# Patient Record
Sex: Female | Born: 1975
Health system: Southern US, Community
[De-identification: ages and names within clinical notes are randomized; demographics above are authoritative.]

## PROBLEM LIST (undated history)

## (undated) DIAGNOSIS — E559 Vitamin D deficiency, unspecified: Secondary | ICD-10-CM

## (undated) DIAGNOSIS — M1990 Unspecified osteoarthritis, unspecified site: Secondary | ICD-10-CM

## (undated) DIAGNOSIS — D649 Anemia, unspecified: Secondary | ICD-10-CM

## (undated) DIAGNOSIS — K259 Gastric ulcer, unspecified as acute or chronic, without hemorrhage or perforation: Secondary | ICD-10-CM

## (undated) DIAGNOSIS — R768 Other specified abnormal immunological findings in serum: Secondary | ICD-10-CM

## (undated) DIAGNOSIS — R12 Heartburn: Secondary | ICD-10-CM

## (undated) DIAGNOSIS — E7800 Pure hypercholesterolemia, unspecified: Secondary | ICD-10-CM

## (undated) DIAGNOSIS — E118 Type 2 diabetes mellitus with unspecified complications: Secondary | ICD-10-CM

## (undated) DIAGNOSIS — I10 Essential (primary) hypertension: Secondary | ICD-10-CM

## (undated) DIAGNOSIS — N83209 Unspecified ovarian cyst, unspecified side: Secondary | ICD-10-CM

## (undated) DIAGNOSIS — K769 Liver disease, unspecified: Secondary | ICD-10-CM

## (undated) DIAGNOSIS — H52209 Unspecified astigmatism, unspecified eye: Secondary | ICD-10-CM

## (undated) DIAGNOSIS — K219 Gastro-esophageal reflux disease without esophagitis: Secondary | ICD-10-CM

## (undated) DIAGNOSIS — R531 Weakness: Secondary | ICD-10-CM

## (undated) DIAGNOSIS — R5383 Other fatigue: Secondary | ICD-10-CM

## (undated) DIAGNOSIS — R160 Hepatomegaly, not elsewhere classified: Secondary | ICD-10-CM

## (undated) DIAGNOSIS — K760 Fatty (change of) liver, not elsewhere classified: Secondary | ICD-10-CM

## (undated) DIAGNOSIS — R682 Dry mouth, unspecified: Secondary | ICD-10-CM

## (undated) DIAGNOSIS — H16209 Unspecified keratoconjunctivitis, unspecified eye: Secondary | ICD-10-CM

## (undated) DIAGNOSIS — M791 Myalgia, unspecified site: Secondary | ICD-10-CM

## (undated) DIAGNOSIS — M1712 Unilateral primary osteoarthritis, left knee: Secondary | ICD-10-CM

## (undated) DIAGNOSIS — F32A Depression, unspecified: Secondary | ICD-10-CM

## (undated) DIAGNOSIS — R7689 Other specified abnormal immunological findings in serum: Secondary | ICD-10-CM

## (undated) DIAGNOSIS — E119 Type 2 diabetes mellitus without complications: Secondary | ICD-10-CM

## (undated) DIAGNOSIS — E11319 Type 2 diabetes mellitus with unspecified diabetic retinopathy without macular edema: Secondary | ICD-10-CM

## (undated) DIAGNOSIS — F419 Anxiety disorder, unspecified: Secondary | ICD-10-CM

## (undated) DIAGNOSIS — R918 Other nonspecific abnormal finding of lung field: Secondary | ICD-10-CM

## (undated) HISTORY — DX: Myalgia, unspecified site: M79.10

## (undated) HISTORY — DX: Other specified abnormal immunological findings in serum: R76.89

## (undated) HISTORY — DX: Weakness: R53.1

## (undated) HISTORY — DX: Dry mouth, unspecified: R68.2

## (undated) HISTORY — DX: Anxiety disorder, unspecified: F41.9

## (undated) HISTORY — PX: TUBAL LIGATION: SHX77

## (undated) HISTORY — DX: Unspecified keratoconjunctivitis, unspecified eye: H16.209

## (undated) HISTORY — DX: Other fatigue: R53.83

## (undated) HISTORY — PX: TONSILLECTOMY: SHX5217

## (undated) HISTORY — DX: Other specified abnormal immunological findings in serum: R76.8

## (undated) HISTORY — DX: Anemia, unspecified: D64.9

## (undated) HISTORY — DX: Type 2 diabetes mellitus with unspecified complications: E11.8

## (undated) HISTORY — DX: Gastric ulcer, unspecified as acute or chronic, without hemorrhage or perforation: K25.9

## (undated) HISTORY — PX: DILATION AND CURETTAGE OF UTERUS: SHX78

## (undated) HISTORY — DX: Hepatomegaly, not elsewhere classified: R16.0

## (undated) HISTORY — DX: Type 2 diabetes mellitus with unspecified diabetic retinopathy without macular edema: E11.319

## (undated) HISTORY — DX: Depression, unspecified: F32.A

## (undated) HISTORY — DX: Vitamin D deficiency, unspecified: E55.9

## (undated) HISTORY — DX: Liver disease, unspecified: K76.9

## (undated) HISTORY — DX: Gastro-esophageal reflux disease without esophagitis: K21.9

## (undated) HISTORY — DX: Type 2 diabetes mellitus without complications: E11.9

## (undated) HISTORY — DX: Unspecified astigmatism, unspecified eye: H52.209

## (undated) HISTORY — DX: Heartburn: R12

## (undated) HISTORY — DX: Unilateral primary osteoarthritis, left knee: M17.12

## (undated) HISTORY — DX: Pure hypercholesterolemia, unspecified: E78.00

---

## 2011-06-25 ENCOUNTER — Encounter: Payer: Self-pay | Admitting: *Deleted

## 2011-06-25 ENCOUNTER — Emergency Department (HOSPITAL_BASED_OUTPATIENT_CLINIC_OR_DEPARTMENT_OTHER)
Admission: EM | Admit: 2011-06-25 | Discharge: 2011-06-25 | Disposition: A | Discharge status: Home / Self Care | Payer: Self-pay | Attending: Emergency Medicine | Admitting: Emergency Medicine

## 2011-06-25 DIAGNOSIS — N898 Other specified noninflammatory disorders of vagina: Secondary | ICD-10-CM | POA: Insufficient documentation

## 2011-06-25 DIAGNOSIS — N939 Abnormal uterine and vaginal bleeding, unspecified: Secondary | ICD-10-CM

## 2011-06-25 DIAGNOSIS — R7309 Other abnormal glucose: Secondary | ICD-10-CM | POA: Insufficient documentation

## 2011-06-25 DIAGNOSIS — R739 Hyperglycemia, unspecified: Secondary | ICD-10-CM

## 2011-06-25 HISTORY — DX: Essential (primary) hypertension: I10

## 2011-06-25 LAB — URINALYSIS, ROUTINE W REFLEX MICROSCOPIC
Bilirubin Urine: NEGATIVE
Ketones, ur: 15 mg/dL — AB
Leukocytes, UA: NEGATIVE
Nitrite: NEGATIVE
Protein, ur: NEGATIVE mg/dL
Urobilinogen, UA: 0.2 mg/dL (ref 0.0–1.0)

## 2011-06-25 LAB — CBC
Hemoglobin: 13.7 g/dL (ref 12.0–15.0)
MCH: 22.9 pg — ABNORMAL LOW (ref 26.0–34.0)
MCHC: 33.8 g/dL (ref 30.0–36.0)
MCV: 67.6 fL — ABNORMAL LOW (ref 78.0–100.0)
RBC: 5.99 MIL/uL — ABNORMAL HIGH (ref 3.87–5.11)

## 2011-06-25 LAB — WET PREP, GENITAL

## 2011-06-25 LAB — PROTIME-INR: Prothrombin Time: 13.6 seconds (ref 11.6–15.2)

## 2011-06-25 LAB — BASIC METABOLIC PANEL
BUN: 16 mg/dL (ref 6–23)
Chloride: 94 mEq/L — ABNORMAL LOW (ref 96–112)
Creatinine, Ser: 0.7 mg/dL (ref 0.50–1.10)
GFR calc Af Amer: >60 mL/min (ref >60)
Glucose, Bld: 349 mg/dL — ABNORMAL HIGH (ref 70–99)
Potassium: 4 mEq/L (ref 3.5–5.1)

## 2011-06-25 LAB — PREGNANCY, URINE: Preg Test, Ur: NEGATIVE

## 2011-06-25 LAB — URINE MICROSCOPIC-ADD ON

## 2011-06-25 MED ORDER — ONDANSETRON HCL 4 MG/2ML IJ SOLN
4.0000 mg | Freq: Once | INTRAMUSCULAR | Status: AC
  Administered 2011-06-25: 4 mg via INTRAVENOUS
  Filled 2011-06-25: qty 2

## 2011-06-25 MED ORDER — SODIUM CHLORIDE 0.9 % IV BOLUS (SEPSIS)
1000.0000 mL | Freq: Once | INTRAVENOUS | Status: AC
  Administered 2011-06-25: 1000 mL via INTRAVENOUS

## 2011-06-25 NOTE — ED Notes (Signed)
Pt. In no distress at discharge 

## 2011-06-25 NOTE — ED Provider Notes (Signed)
History     CSN: [id number] Arrival date & time: 06/25/2011 11:48 AM  Chief Complaint  Patient presents with  . Vaginal Bleeding   HPI Comments: Patient presents with vaginal bleeding that has been going on since June intermittently. She reports some subjective shortness of breath and fatigue since the middle of August. She says she saturates about 6 pads daily with some clots. She was recently on hydrochlorothiazide for hypertension. She has abdominal pain, chest pain, nausea, vomiting. She did require blood transfusion. She does have a history of fibroids and has had 2 prior pregnancies and tubal ligation. She does have some mild lower abdominal cramping that comes and goes with bleeding.  The history is provided by the patient.    Past Medical History  Diagnosis Date  . Hypertension     Past Surgical History  Procedure Date  . Tubal ligation   . Dilation and curettage of uterus     No family history on file.  History  Substance Use Topics  . Smoking status: Passive Smoker  . Smokeless tobacco: Not on file  . Alcohol Use: No    OB History    Grav Para Term Preterm Abortions TAB SAB Ect Mult Living                  Review of Systems  Constitutional: Positive for fatigue. Negative for fever, activity change and appetite change.  HENT: Negative for congestion and rhinorrhea.   Eyes: Negative for visual disturbance.  Respiratory: Positive for shortness of breath.   Gastrointestinal: Positive for abdominal pain. Negative for nausea and vomiting.  Genitourinary: Positive for hematuria, vaginal bleeding and pelvic pain. Negative for dysuria.  Musculoskeletal: Negative for back pain.  Neurological: Negative for dizziness, weakness, light-headedness and headaches.    Physical Exam  BP 164/88  Pulse 105  Temp(Src) 98.2 F (36.8 C) (Oral)  Resp 22  SpO2 100%  Physical Exam  Constitutional: She is oriented to person, place, and time. She appears well-developed and  well-nourished. No distress.  HENT:  Head: Normocephalic and atraumatic.  Mouth/Throat: Oropharynx is clear and moist. No oropharyngeal exudate.  Eyes: Conjunctivae are normal. Pupils are equal, round, and reactive to light.  Neck: Normal range of motion.  Cardiovascular: Normal rate, regular rhythm and normal heart sounds.   Pulmonary/Chest: Effort normal and breath sounds normal. No respiratory distress.  Abdominal: Soft. There is no tenderness. There is no rebound and no guarding.  Genitourinary: Cervix exhibits no motion tenderness and no friability. Right adnexum displays no tenderness and no fullness. Left adnexum displays no tenderness and no fullness. There is bleeding around the vagina.       No active bleeding. Dark blood in vault.  Cervix closed and nontender  Musculoskeletal: Normal range of motion. She exhibits no edema and no tenderness.  Neurological: She is alert and oriented to person, place, and time. No cranial nerve deficit.  Skin: Skin is warm.    ED Course  Procedures  MDM Vaginal bleeding, fatigue and subjective SOB.   CBC, INR, pelvic exam, orthostatics  Orthostatics positive.  Patient given IV hydration in ED.  No active bleeding on exam, hemoglobin stable. Discussed options for DUB including hormone therapy.  Patient will discuss with her doctor. Will treat for bacterial vaginosis.  Elevated sugar noted as well as glucose in the urine. Patient reports no history of diabetes but states that her doctor at urgent care in [facility] has her already set up for fasting blood  work this coming Thursday. I told the patient this is suspicious for diabetes but she will need formal testing.   Results for orders placed during the hospital encounter of 06/25/11  CBC      Component Value Range   WBC 8.1  4.0 - 10.5 (K/uL)   RBC 5.99 (*) 3.87 - 5.11 (MIL/uL)   Hemoglobin 13.7  12.0 - 15.0 (g/dL)   HCT 16.1  09.6 - 04.5 (%)   MCV 67.6 (*) 78.0 - 100.0 (fL)   MCH 22.9 (*)  26.0 - 34.0 (pg)   MCHC 33.8  30.0 - 36.0 (g/dL)   RDW 40.9  81.1 - 91.4 (%)   Platelets 272  150 - 400 (K/uL)  PROTIME-INR      Component Value Range   Prothrombin Time 13.6  11.6 - 15.2 (seconds)   INR 1.02  0.00 - 1.49   BASIC METABOLIC PANEL      Component Value Range   Sodium 131 (*) 135 - 145 (mEq/L)   Potassium 4.0  3.5 - 5.1 (mEq/L)   Chloride 94 (*) 96 - 112 (mEq/L)   CO2 24  19 - 32 (mEq/L)   Glucose, Bld 349 (*) 70 - 99 (mg/dL)   BUN 16  6 - 23 (mg/dL)   Creatinine, Ser 7.82  0.50 - 1.10 (mg/dL)   Calcium 9.9  8.4 - 95.6 (mg/dL)   GFR calc non Af Amer >60  >60 (mL/min)   GFR calc Af Amer >60  >60 (mL/min)  WET PREP, GENITAL      Component Value Range   Yeast, Wet Prep NONE SEEN  NONE SEEN    Trich, Wet Prep NONE SEEN  NONE SEEN    Clue Cells, Wet Prep FEW (*) NONE SEEN    WBC, Wet Prep HPF POC FEW (*) NONE SEEN    No results found.      Glynn Octave, MD 06/25/11 [phone]

## 2011-06-25 NOTE — ED Notes (Signed)
Heavy vaginal bleeding on and off since June. Was seen at a clinic Aug 23rd and her BP was elevated. They gave her HCTZ for hypertension. She has been tired and sob with activity since that time.

## 2011-06-25 NOTE — ED Notes (Signed)
Abd cramping this am with vaginal blood clots.

## 2011-06-26 LAB — GC/CHLAMYDIA PROBE AMP, GENITAL
Chlamydia, DNA Probe: NEGATIVE
GC Probe Amp, Genital: NEGATIVE

## 2013-06-09 ENCOUNTER — Ambulatory Visit (INDEPENDENT_AMBULATORY_CARE_PROVIDER_SITE_OTHER): Payer: Self-pay | Admitting: Obstetrics and Gynecology

## 2013-06-09 ENCOUNTER — Encounter: Payer: Self-pay | Admitting: Obstetrics and Gynecology

## 2013-06-09 ENCOUNTER — Other Ambulatory Visit (HOSPITAL_COMMUNITY)
Admission: RE | Admit: 2013-06-09 | Discharge: 2013-06-09 | Disposition: A | Discharge status: Home / Self Care | Payer: Self-pay | Source: Ambulatory Visit | Attending: Obstetrics and Gynecology | Admitting: Obstetrics and Gynecology

## 2013-06-09 VITALS — BP 145/97 | HR 105 | Temp 97.2°F | Ht 63.0 in | Wt 297.9 lb

## 2013-06-09 DIAGNOSIS — N939 Abnormal uterine and vaginal bleeding, unspecified: Secondary | ICD-10-CM

## 2013-06-09 DIAGNOSIS — N949 Unspecified condition associated with female genital organs and menstrual cycle: Secondary | ICD-10-CM | POA: Insufficient documentation

## 2013-06-09 DIAGNOSIS — N926 Irregular menstruation, unspecified: Secondary | ICD-10-CM

## 2013-06-09 DIAGNOSIS — N938 Other specified abnormal uterine and vaginal bleeding: Secondary | ICD-10-CM | POA: Insufficient documentation

## 2013-06-09 NOTE — Progress Notes (Signed)
  Subjective:    Patient ID: Elizabeth [name], female    DOB: 09-10-1976, 37 y.o.   MRN: [id number]  HPI 37 yo with BMI 52 and LMP 06/05/2013 presenting today for evaluation of abnormal uterine bleeding. Patient reports abnormal bleeding for the past year. She describes her cycles as being heavy and prolonged (lasting up to 6 weeks) with passage of large clots. Patient denies chest pain, SOB, lightheadedness/dizziness.    Past Medical History  Diagnosis Date  . Hypertension   . Diabetes mellitus without complication    Past Surgical History  Procedure Laterality Date  . Tubal ligation    . Dilation and curettage of uterus    . Tonsillectomy     Family History  Problem Relation Age of Onset  . Diabetes Mother   . Hypertension Mother    History  Substance Use Topics  . Smoking status: Never Smoker   . Smokeless tobacco: Never Used  . Alcohol Use: No      Review of Systems  All other systems reviewed and are negative.       Objective:   Physical Exam GENERAL: Well-developed, well-nourished female in no acute distress.  ABDOMEN: Soft, nontender, nondistended. Obese. PELVIC: Normal external female genitalia. Vagina is pink and rugated.  Normal discharge. Normal appearing cervix. Bimanual exam limited secondary to body habitus EXTREMITIES: No cyanosis, clubbing, or edema, 2+ distal pulses.  04/12/2013 ultrasound: Uterus measures 9.8x 4.4x 5.6 cm with endometrium measuring 5.69mm. No uterine mass. Normal bilateral ovaries- right: 20x17x49mm and left: 27x19x23mm. No adnexal masses. No free fluid in pelvis.    Assessment & Plan:  37 yo with abnormal uterine bleeding - Discussed need for endometrial biopsy ENDOMETRIAL BIOPSY     The indications for endometrial biopsy were reviewed.   Risks of the biopsy including cramping, bleeding, infection, uterine perforation, inadequate specimen and need for additional procedures  were discussed. The patient states she understands and agrees  to undergo procedure today. Consent was signed. Time out was performed. Urine HCG was negative. A sterile speculum was placed in the patient's vagina and the cervix was prepped with Betadine. A single-toothed tenaculum was placed on the anterior lip of the cervix to stabilize it. The uterine cavity was sounded to a depth of 9 cm using the uterine sound. The 3 mm pipelle was introduced into the endometrial cavity without difficulty, 2 passes were made.  A  moderate amount of tissue was  sent to pathology. The instruments were removed from the patient's vagina. Minimal bleeding from the cervix was noted. The patient tolerated the procedure well.  Routine post-procedure instructions were given to the patient. The patient will follow up in two weeks to review the results and for further management.   - patient reports having a normal ultrasound in June- will obtain records for review - RTC in 2 weeks to discuss results and further management. - Discussed medical management with Mirena IUD for which the patient is very much interested

## 2013-06-14 ENCOUNTER — Encounter: Payer: Self-pay | Admitting: *Deleted

## 2013-06-23 ENCOUNTER — Encounter: Payer: Self-pay | Admitting: *Deleted

## 2013-07-21 ENCOUNTER — Ambulatory Visit: Payer: Self-pay | Admitting: Obstetrics & Gynecology

## 2013-08-09 ENCOUNTER — Ambulatory Visit (INDEPENDENT_AMBULATORY_CARE_PROVIDER_SITE_OTHER): Payer: Self-pay | Admitting: Obstetrics & Gynecology

## 2013-08-09 ENCOUNTER — Encounter: Payer: Self-pay | Admitting: Obstetrics & Gynecology

## 2013-08-09 VITALS — BP 121/85 | HR 101 | Temp 97.8°F | Ht 63.0 in | Wt 304.4 lb

## 2013-08-09 DIAGNOSIS — N920 Excessive and frequent menstruation with regular cycle: Secondary | ICD-10-CM

## 2013-08-09 DIAGNOSIS — Z3049 Encounter for surveillance of other contraceptives: Secondary | ICD-10-CM

## 2013-08-09 DIAGNOSIS — Z01812 Encounter for preprocedural laboratory examination: Secondary | ICD-10-CM

## 2013-08-09 MED ORDER — LEVONORGESTREL 20 MCG/24HR IU IUD
INTRAUTERINE_SYSTEM | Freq: Once | INTRAUTERINE | Status: AC
  Administered 2013-08-09: 16:00:00 via INTRAUTERINE

## 2013-08-09 NOTE — Patient Instructions (Signed)

## 2013-08-09 NOTE — Addendum Note (Signed)
Addended by: Faythe Casa on: 08/09/2013 03:51 PM   Modules accepted: Orders

## 2013-08-09 NOTE — Progress Notes (Signed)
Subjective:     Patient ID: Elizabeth [name], female   DOB: 03/01/1976, 37 y.o.   MRN: [id number]  HPI  Elizabeth [name] is a 37 y.o. No obstetric history on file. here for Mirena IUD insertion. H/o menorrhagia with negative endometrial biopsy 06/09/2013.      Review of Systems     Objective:   Physical Exam BP 121/85  Pulse 101  Temp(Src) 97.8 F (36.6 C)  Ht 5\' 3"  (1.6 m)  Wt 304 lb 6.4 oz (138.075 kg)  BMI 53.94 kg/m2  LMP 07/19/2013  GYNECOLOGY CLINIC PROCEDURE NOTE IUD Insertion Procedure Note Patient identified, informed consent performed.  Discussed risks of irregular bleeding, cramping, infection, malpositioning or misplacement of the IUD outside the uterus which may require further procedures. Time out was performed.  Urine pregnancy test negative.  Speculum placed in the vagina.  Cervix visualized.  Cleaned with Betadine x 2.  Grasped anteriorly with a single tooth tenaculum.  Uterus sounded to 8 cm.  Mirena IUD placed per manufacturer's recommendations.  Strings trimmed to 3 cm. Tenaculum was removed, good hemostasis noted.  Patient tolerated procedure well.    06/09/2013 Diagnosis Endometrium, biopsy - DEGENERATING SECRETORY-TYPE ENDOMETRIUM. - THERE IS NO EVIDENCE OF HYPERPLASIA OR MALIGNANCY.     Assessment:     menorrhagia     Plan:    Patient was given post-procedure instructions.   Patient was also asked to follow up in 5weeks for IUD check.

## 2013-08-11 ENCOUNTER — Encounter: Payer: Self-pay | Admitting: *Deleted

## 2013-08-12 NOTE — Progress Notes (Signed)
Mirena for this patient was obtained through the Patient Assistance Program [facility].

## 2013-08-17 ENCOUNTER — Ambulatory Visit (INDEPENDENT_AMBULATORY_CARE_PROVIDER_SITE_OTHER): Payer: Self-pay | Admitting: Obstetrics and Gynecology

## 2013-08-17 ENCOUNTER — Encounter: Payer: Self-pay | Admitting: Family Medicine

## 2013-08-17 ENCOUNTER — Telehealth: Payer: Self-pay | Admitting: *Deleted

## 2013-08-17 VITALS — BP 110/78 | HR 90 | Temp 97.0°F | Ht 63.0 in | Wt 302.5 lb

## 2013-08-17 DIAGNOSIS — Z30431 Encounter for routine checking of intrauterine contraceptive device: Secondary | ICD-10-CM

## 2013-08-17 NOTE — Progress Notes (Signed)
Patient ID: Elizabeth [name], female   DOB: 1976/03/20, 37 y.o.   MRN: [id number] 37 yo G2P2 here today for evaluation of IUD. Patient had IUD placed on 10/20 and states that she experienced some vaginal irritation yesterday and felt the IUD with her fingers coming out. Patient denies cramping but admits to vaginal bleeding similar to a period since insertion  GENERAL: Well-developed, well-nourished female in no acute distress. obese PELVIC: Normal external female genitalia. Vagina is pink and rugated.  Normal discharge. Normal appearing cervix with 3 cm strings extending from external os. Bimanual exam limited to size. No adnexal mass or tenderness. EXTREMITIES: No cyanosis, clubbing, or edema, 2+ distal pulses.  A/P 37 yo s/p IUD placement a week ago - Reassurance provided as IUD appears to be in place - Patient believes that I pushed IUD back into place when inserting the speculum. Offered pelvic ultrasound to confirm IUD placement- patient declined. Offered to cut the strings shorter- patient declined. Patient left office visibly upset. - RTC as scheduled for string check

## 2013-08-17 NOTE — Telephone Encounter (Signed)
Pt left message stating that she had IUD placed on 10/20 and is having problems. She is having heavy bleeding which she knew to expect but also feels like the device is "sticking" her. She stated that she felt for the strings and they were "right there" inside the vagina as well as she thought she was feeling the IUD. I called pt and left message that we need to see her today. Please call ASAP for appt.

## 2013-08-26 ENCOUNTER — Other Ambulatory Visit: Payer: Self-pay

## 2013-09-13 ENCOUNTER — Ambulatory Visit (INDEPENDENT_AMBULATORY_CARE_PROVIDER_SITE_OTHER): Payer: Self-pay | Admitting: Obstetrics & Gynecology

## 2013-09-13 ENCOUNTER — Encounter: Payer: Self-pay | Admitting: Obstetrics & Gynecology

## 2013-09-13 VITALS — BP 147/97 | HR 88 | Temp 98.9°F | Ht 63.0 in | Wt 301.9 lb

## 2013-09-13 DIAGNOSIS — Z30431 Encounter for routine checking of intrauterine contraceptive device: Secondary | ICD-10-CM

## 2013-09-13 NOTE — Patient Instructions (Signed)
Levonorgestrel intrauterine device (IUD) What is this medicine? LEVONORGESTREL IUD (LEE voe nor jes trel) is a contraceptive (birth control) device. The device is placed inside the uterus by a healthcare professional. It is used to prevent pregnancy and can also be used to treat heavy bleeding that occurs during your period. Depending on the device, it can be used for 3 to 5 years. This medicine may be used for other purposes; ask your health care provider or pharmacist if you have questions. COMMON BRAND NAME(S): Mirena, Skyla What should I tell my health care provider before I take this medicine? They need to know if you have any of these conditions: -abnormal Pap smear -cancer of the breast, uterus, or cervix -diabetes -endometritis -genital or pelvic infection now or in the past -have more than one sexual partner or your partner has more than one partner -heart disease -history of an ectopic or tubal pregnancy -immune system problems -IUD in place -liver disease or tumor -problems with blood clots or take blood-thinners -use intravenous drugs -uterus of unusual shape -vaginal bleeding that has not been explained -an unusual or allergic reaction to levonorgestrel, other hormones, silicone, or polyethylene, medicines, foods, dyes, or preservatives -pregnant or trying to get pregnant -breast-feeding How should I use this medicine? This device is placed inside the uterus by a health care professional. Talk to your pediatrician regarding the use of this medicine in children. Special care may be needed. Overdosage: If you think you have taken too much of this medicine contact a poison control center or emergency room at once. NOTE: This medicine is only for you. Do not share this medicine with others. What if I miss a dose? This does not apply. What may interact with this medicine? Do not take this medicine with any of the following  medications: -amprenavir -bosentan -fosamprenavir This medicine may also interact with the following medications: -aprepitant -barbiturate medicines for inducing sleep or treating seizures -bexarotene -griseofulvin -medicines to treat seizures like carbamazepine, ethotoin, felbamate, oxcarbazepine, phenytoin, topiramate -modafinil -pioglitazone -rifabutin -rifampin -rifapentine -some medicines to treat HIV infection like atazanavir, indinavir, lopinavir, nelfinavir, tipranavir, ritonavir -St. John's wort -warfarin This list may not describe all possible interactions. Give your health care provider a list of all the medicines, herbs, non-prescription drugs, or dietary supplements you use. Also tell them if you smoke, drink alcohol, or use illegal drugs. Some items may interact with your medicine. What should I watch for while using this medicine? Visit your doctor or health care professional for regular check ups. See your doctor if you or your partner has sexual contact with others, becomes HIV positive, or gets a sexual transmitted disease. This product does not protect you against HIV infection (AIDS) or other sexually transmitted diseases. You can check the placement of the IUD yourself by reaching up to the top of your vagina with clean fingers to feel the threads. Do not pull on the threads. It is a good habit to check placement after each menstrual period. Call your doctor right away if you feel more of the IUD than just the threads or if you cannot feel the threads at all. The IUD may come out by itself. You may become pregnant if the device comes out. If you notice that the IUD has come out use a backup birth control method like condoms and call your health care provider. Using tampons will not change the position of the IUD and are okay to use during your period. What side effects may I   notice from receiving this medicine? Side effects that you should report to your doctor or  health care professional as soon as possible: -allergic reactions like skin rash, itching or hives, swelling of the face, lips, or tongue -fever, flu-like symptoms -genital sores -high blood pressure -no menstrual period for 6 weeks during use -pain, swelling, warmth in the leg -pelvic pain or tenderness -severe or sudden headache -signs of pregnancy -stomach cramping -sudden shortness of breath -trouble with balance, talking, or walking -unusual vaginal bleeding, discharge -yellowing of the eyes or skin Side effects that usually do not require medical attention (report to your doctor or health care professional if they continue or are bothersome): -acne -breast pain -change in sex drive or performance -changes in weight -cramping, dizziness, or faintness while the device is being inserted -headache -irregular menstrual bleeding within first 3 to 6 months of use -nausea This list may not describe all possible side effects. Call your doctor for medical advice about side effects. You may report side effects to FDA at 1-800-FDA-1088. Where should I keep my medicine? This does not apply. NOTE: This sheet is a summary. It may not cover all possible information. If you have questions about this medicine, talk to your doctor, pharmacist, or health care provider.  2014, Elsevier/Gold Standard. (2011-11-07 13:54:04)  

## 2013-09-13 NOTE — Progress Notes (Signed)
Patient ID: Elizabeth [name], female   DOB: 03-25-1976, 37 y.o.   MRN: [id number] History:  37 y.o. [id number] here today for today for IUD string check; Mirena IUD was placed 08/09/2013.  Pt complains that she is feeling the strings. No other concerning side effects.   Review of Systems:  Pertinent items are noted in HPI.  Objective:  Physical Exam Blood pressure 147/97, pulse 88, temperature 98.9 F (37.2 C), temperature source Oral, height 5\' 3"  (1.6 m), weight 301 lb 14.4 oz (136.941 kg), last menstrual period 09/04/2013. Gen: NAD Abd: Soft, nontender and nondistended Pelvic: Normal appearing external genitalia; normal appearing vaginal mucosa and cervix.  IUD strings visualized, about 1 cm in length outside the vagina.   These were cut to 3 cm outside the cervix.  Assessment & Plan:  Normal IUD check. Patient to keep IUD in place for five years; can come in for removal if she desires pregnancy within the next five years. Routine preventative health maintenance measures emphasized.

## 2014-08-22 ENCOUNTER — Encounter: Payer: Self-pay | Admitting: Obstetrics & Gynecology

## 2015-12-08 ENCOUNTER — Emergency Department (HOSPITAL_BASED_OUTPATIENT_CLINIC_OR_DEPARTMENT_OTHER)
Admission: EM | Admit: 2015-12-08 | Discharge: 2015-12-08 | Disposition: A | Discharge status: Home / Self Care | Payer: PRIVATE HEALTH INSURANCE | Attending: Emergency Medicine | Admitting: Emergency Medicine

## 2015-12-08 ENCOUNTER — Emergency Department (HOSPITAL_BASED_OUTPATIENT_CLINIC_OR_DEPARTMENT_OTHER): Payer: PRIVATE HEALTH INSURANCE

## 2015-12-08 ENCOUNTER — Encounter (HOSPITAL_BASED_OUTPATIENT_CLINIC_OR_DEPARTMENT_OTHER): Payer: Self-pay | Admitting: *Deleted

## 2015-12-08 DIAGNOSIS — Z794 Long term (current) use of insulin: Secondary | ICD-10-CM | POA: Diagnosis not present

## 2015-12-08 DIAGNOSIS — R05 Cough: Secondary | ICD-10-CM | POA: Diagnosis present

## 2015-12-08 DIAGNOSIS — J209 Acute bronchitis, unspecified: Secondary | ICD-10-CM | POA: Insufficient documentation

## 2015-12-08 DIAGNOSIS — Z79899 Other long term (current) drug therapy: Secondary | ICD-10-CM | POA: Diagnosis not present

## 2015-12-08 DIAGNOSIS — E119 Type 2 diabetes mellitus without complications: Secondary | ICD-10-CM | POA: Insufficient documentation

## 2015-12-08 DIAGNOSIS — I10 Essential (primary) hypertension: Secondary | ICD-10-CM | POA: Diagnosis not present

## 2015-12-08 DIAGNOSIS — J9801 Acute bronchospasm: Secondary | ICD-10-CM

## 2015-12-08 MED ORDER — ALBUTEROL SULFATE (2.5 MG/3ML) 0.083% IN NEBU
5.0000 mg | INHALATION_SOLUTION | Freq: Once | RESPIRATORY_TRACT | Status: AC
  Administered 2015-12-08: 5 mg via RESPIRATORY_TRACT
  Filled 2015-12-08: qty 6

## 2015-12-08 MED ORDER — ALBUTEROL SULFATE HFA 108 (90 BASE) MCG/ACT IN AERS
2.0000 | INHALATION_SPRAY | RESPIRATORY_TRACT | Status: DC
  Administered 2015-12-08: 2 via RESPIRATORY_TRACT
  Filled 2015-12-08: qty 6.7

## 2015-12-08 MED ORDER — ONDANSETRON 8 MG PO TBDP
8.0000 mg | ORAL_TABLET | Freq: Once | ORAL | Status: AC
  Administered 2015-12-08: 8 mg via ORAL
  Filled 2015-12-08: qty 1

## 2015-12-08 MED ORDER — DEXAMETHASONE 4 MG PO TABS
4.0000 mg | ORAL_TABLET | Freq: Once | ORAL | Status: AC
  Administered 2015-12-08: 4 mg via ORAL
  Filled 2015-12-08: qty 1

## 2015-12-08 NOTE — ED Provider Notes (Signed)
CSN: [id number]     Arrival date & time 12/08/15  2002 History  By signing my name below, I, Evelene Croon, attest that this documentation has been prepared under the direction and in the presence of Jola Schmidt, MD . Electronically Signed: Evelene Croon, Scribe. 12/08/2015. 10:44 PM.    Chief Complaint  Patient presents with  . Cough     The history is provided by the patient. No language interpreter was used.   HPI Comments:  Elizabeth [name] is a 40 y.o. female with a history of HTN and DM, who presents to the Emergency Department complaining of a forceful persistent cough x 1 week. She reports associated CP secondary to cough and post-tussive vomiting.  No alleviating factors noted.  Denies abdominal pain.  No dysuria or urinary frequency.  Reports chest pain only when she coughs.  Denies blood in her vomit.  Past Medical History  Diagnosis Date  . Hypertension   . Diabetes mellitus without complication [facility])    Past Surgical History  Procedure Laterality Date  . Tubal ligation    . Dilation and curettage of uterus    . Tonsillectomy     Family History  Problem Relation Age of Onset  . Diabetes Mother   . Hypertension Mother    Social History  Substance Use Topics  . Smoking status: Never Smoker   . Smokeless tobacco: Never Used  . Alcohol Use: No   OB History    Gravida Para Term Preterm AB TAB SAB Ectopic Multiple Living   2 2 2  0 0 0 0 0 0 2     Review of Systems  10 systems reviewed and all are negative for acute change except as noted in the HPI.  Allergies  Review of patient's allergies indicates no known allergies.  Home Medications   Prior to Admission medications   Medication Sig Start Date End Date Taking? Authorizing Provider  amLODipine (NORVASC) 10 MG tablet Take 10 mg by mouth daily.    Historical Provider, MD  ferrous sulfate 325 (65 FE) MG tablet Take 325 mg by mouth 2 (two) times daily.    Historical Provider, MD  glipiZIDE (GLUCOTROL) 10 MG  tablet Take 10 mg by mouth 2 (two) times daily before a meal.    Historical Provider, MD  hydrochlorothiazide 25 MG tablet Take 25 mg by mouth daily.      Historical Provider, MD  insulin glargine (LANTUS) 100 UNIT/ML injection Inject 20 Units into the skin at bedtime.    Historical Provider, MD  lisinopril (PRINIVIL,ZESTRIL) 40 MG tablet Take 40 mg by mouth daily.    Historical Provider, MD  rosuvastatin (CRESTOR) 20 MG tablet Take 20 mg by mouth daily.    Historical Provider, MD   BP 147/106 mmHg  Pulse 110  Temp(Src) 99.3 F (37.4 C) (Oral)  Resp 20  Ht 5\' 3"  (1.6 m)  Wt 296 lb (134.265 kg)  BMI 52.45 kg/m2  SpO2 95%  LMP 11/21/2015 Physical Exam  Constitutional: She is oriented to person, place, and time. She appears well-developed and well-nourished. No distress.  HENT:  Head: Normocephalic and atraumatic.  Eyes: EOM are normal.  Neck: Normal range of motion.  Cardiovascular: Normal rate, regular rhythm and normal heart sounds.   Pulmonary/Chest: Effort normal and breath sounds normal.  Abdominal: Soft. She exhibits no distension. There is no tenderness.  Musculoskeletal: Normal range of motion.  Neurological: She is alert and oriented to person, place, and time.  Skin: Skin is  warm and dry.  Psychiatric: She has a normal mood and affect. Judgment normal.  Nursing note and vitals reviewed.   ED Course  Procedures   DIAGNOSTIC STUDIES:  Oxygen Saturation is 95% on RA, adequate by my interpretation.    COORDINATION OF CARE:  10:42 PM Will order breathing treatment. Discussed treatment plan with pt at bedside and pt agreed to plan.   Imaging Review Dg Chest 2 View  12/08/2015  CLINICAL DATA:  Productive cough for the past 6 days. Shortness of breath for the past 2 days. Ex-smoker. EXAM: CHEST  2 VIEW COMPARISON:  None. FINDINGS: Normal sized heart. Clear lungs. Mild diffuse peribronchial thickening and accentuation of the interstitial markings. Unremarkable bones.  IMPRESSION: Mild bronchitic changes. Electronically Signed   By: Claudie Revering M.D.   On: 12/08/2015 20:46   I have personally reviewed and evaluated these images as part of my medical decision-making.   MDM   Final diagnoses:  Acute bronchitis, unspecified organism  Bronchospasm    11:10 PM Patient feels much better after bronchodilators.  Patient with likely bronchitis and bronchospasm.  Discharged home with an albuterol inhaler for cough.  No infiltrate noted on chest x-ray.  Single dose of Decadron given for bronchospasm.  Patient understands return to the ER for new or worsening symptoms   I personally performed the services described in this documentation, which was scribed in my presence. The recorded information has been reviewed and is accurate.        Jola Schmidt, MD 12/08/15 [phone]

## 2015-12-08 NOTE — ED Notes (Addendum)
Pt c/o cough x 1 week. Forceful coughing  which cause vomiting.

## 2015-12-08 NOTE — Discharge Instructions (Signed)
Bronchospasm, Adult  A bronchospasm is a spasm or tightening of the airways going into the lungs. During a bronchospasm breathing becomes more difficult because the airways get smaller. When this happens there can be coughing, a whistling sound when breathing (wheezing), and difficulty breathing. Bronchospasm is often associated with asthma, but not all patients who experience a bronchospasm have asthma.  CAUSES   A bronchospasm is caused by inflammation or irritation of the airways. The inflammation or irritation may be triggered by:   · Allergies (such as to animals, pollen, food, or mold). Allergens that cause bronchospasm may cause wheezing immediately after exposure or many hours later.    · Infection. Viral infections are believed to be the most common cause of bronchospasm.    · Exercise.    · Irritants (such as pollution, cigarette smoke, strong odors, aerosol sprays, and paint fumes).    · Weather changes. Winds increase molds and pollens in the air. Rain refreshes the air by washing irritants out. Cold air may cause inflammation.    · Stress and emotional upset.    SIGNS AND SYMPTOMS   · Wheezing.    · Excessive nighttime coughing.    · Frequent or severe coughing with a simple cold.    · Chest tightness.    · Shortness of breath.    DIAGNOSIS   Bronchospasm is usually diagnosed through a history and physical exam. Tests, such as chest X-rays, are sometimes done to look for other conditions.  TREATMENT   · Inhaled medicines can be given to open up your airways and help you breathe. The medicines can be given using either an inhaler or a nebulizer machine.  · Corticosteroid medicines may be given for severe bronchospasm, usually when it is associated with asthma.  HOME CARE INSTRUCTIONS   · Always have a plan prepared for seeking medical care. Know when to call your health care provider and local emergency services (911 in the [place] Know where you can access local emergency care.  · Only take medicines as  directed by your health care provider.  · If you were prescribed an inhaler or nebulizer machine, ask your health care provider to explain how to use it correctly. Always use a spacer with your inhaler if you were given one.  · It is necessary to remain calm during an attack. Try to relax and breathe more slowly.   · Control your home environment in the following ways:      Change your heating and air conditioning filter at least once a month.      Limit your use of fireplaces and wood stoves.    Do not smoke and do not allow smoking in your home.      Avoid exposure to perfumes and fragrances.      Get rid of pests (such as roaches and mice) and their droppings.      Throw away plants if you see mold on them.      Keep your house clean and dust free.      Replace carpet with wood, tile, or vinyl flooring. Carpet can trap dander and dust.      Use allergy-proof pillows, mattress covers, and box spring covers.      Wash bed sheets and blankets every week in hot water and dry them in a dryer.      Use blankets that are made of polyester or cotton.      Wash hands frequently.  SEEK MEDICAL CARE IF:   · You have muscle aches.    · You have chest pain.    · The sputum changes from clear or   white to yellow, green, gray, or bloody.    · The sputum you cough up gets thicker.    · There are problems that may be related to the medicine you are given, such as a rash, itching, swelling, or trouble breathing.    SEEK IMMEDIATE MEDICAL CARE IF:   · You have worsening wheezing and coughing even after taking your prescribed medicines.    · You have increased difficulty breathing.    · You develop severe chest pain.  MAKE SURE YOU:   · Understand these instructions.  · Will watch your condition.  · Will get help right away if you are not doing well or get worse.     This information is not intended to replace advice given to you by your health care provider. Make sure you discuss any questions you have with your health care  provider.     Document Released: 10/10/2003 Document Revised: 10/28/2014 Document Reviewed: 03/29/2013  Elsevier Interactive Patient Education ©2016 [employer]

## 2016-02-07 DIAGNOSIS — E7800 Pure hypercholesterolemia, unspecified: Secondary | ICD-10-CM | POA: Insufficient documentation

## 2016-02-07 DIAGNOSIS — E1165 Type 2 diabetes mellitus with hyperglycemia: Secondary | ICD-10-CM | POA: Insufficient documentation

## 2016-02-07 DIAGNOSIS — I10 Essential (primary) hypertension: Secondary | ICD-10-CM | POA: Insufficient documentation

## 2016-02-07 DIAGNOSIS — Z6841 Body Mass Index (BMI) 40.0 and over, adult: Secondary | ICD-10-CM

## 2016-12-02 DIAGNOSIS — E559 Vitamin D deficiency, unspecified: Secondary | ICD-10-CM | POA: Insufficient documentation

## 2017-03-21 LAB — HM DIABETES EYE EXAM

## 2017-04-04 DIAGNOSIS — H52223 Regular astigmatism, bilateral: Secondary | ICD-10-CM | POA: Insufficient documentation

## 2017-04-04 DIAGNOSIS — H16223 Keratoconjunctivitis sicca, not specified as Sjogren's, bilateral: Secondary | ICD-10-CM | POA: Insufficient documentation

## 2017-10-03 DIAGNOSIS — I10 Essential (primary) hypertension: Secondary | ICD-10-CM | POA: Diagnosis not present

## 2017-10-03 DIAGNOSIS — E7800 Pure hypercholesterolemia, unspecified: Secondary | ICD-10-CM | POA: Diagnosis not present

## 2017-10-03 DIAGNOSIS — E559 Vitamin D deficiency, unspecified: Secondary | ICD-10-CM | POA: Diagnosis not present

## 2017-10-03 DIAGNOSIS — Z6841 Body Mass Index (BMI) 40.0 and over, adult: Secondary | ICD-10-CM | POA: Diagnosis not present

## 2017-10-03 DIAGNOSIS — E1165 Type 2 diabetes mellitus with hyperglycemia: Secondary | ICD-10-CM | POA: Diagnosis not present

## 2017-10-06 MED FILL — TRULICITY 0.75 MG/0.5 ML PE: 0.75 | 56 days supply | Qty: 4 | Fill #0

## 2017-10-06 MED FILL — glipiZIDE 5 MG TABS: 5 | 90 days supply | Qty: 180 | Fill #0

## 2017-10-06 MED FILL — AMLODIPINE BESYLATE 10 MG T: 10 | 90 days supply | Qty: 90 | Fill #0

## 2017-10-06 MED FILL — XIGDUO XR 5 MG-1,000 MG TAB: 5-1000 | 90 days supply | Qty: 180 | Fill #0

## 2017-10-06 MED FILL — LOVASTATIN 40 MG TAB: 40 | 90 days supply | Qty: 90 | Fill #0

## 2017-10-09 MED FILL — FLUCONAZOLE 150 MG TABLET: 150 | 3 days supply | Qty: 2 | Fill #0

## 2017-11-13 MED FILL — LISINOPRIL 40 MG TABS: 40 | 90 days supply | Qty: 90 | Fill #0

## 2017-11-19 ENCOUNTER — Encounter: Payer: Self-pay | Admitting: Obstetrics & Gynecology

## 2017-11-19 ENCOUNTER — Ambulatory Visit (INDEPENDENT_AMBULATORY_CARE_PROVIDER_SITE_OTHER): Payer: 59 | Admitting: Obstetrics & Gynecology

## 2017-11-19 VITALS — BP 129/85 | HR 102 | Ht 63.0 in | Wt 301.0 lb

## 2017-11-19 DIAGNOSIS — Z1151 Encounter for screening for human papillomavirus (HPV): Secondary | ICD-10-CM | POA: Diagnosis not present

## 2017-11-19 DIAGNOSIS — IMO0001 Reserved for inherently not codable concepts without codable children: Secondary | ICD-10-CM

## 2017-11-19 DIAGNOSIS — N923 Ovulation bleeding: Secondary | ICD-10-CM

## 2017-11-19 DIAGNOSIS — Z124 Encounter for screening for malignant neoplasm of cervix: Secondary | ICD-10-CM

## 2017-11-19 DIAGNOSIS — Z113 Encounter for screening for infections with a predominantly sexual mode of transmission: Secondary | ICD-10-CM | POA: Diagnosis not present

## 2017-11-19 DIAGNOSIS — N926 Irregular menstruation, unspecified: Secondary | ICD-10-CM

## 2017-11-19 DIAGNOSIS — T8383XA Hemorrhage of genitourinary prosthetic devices, implants and grafts, initial encounter: Secondary | ICD-10-CM

## 2017-11-19 NOTE — Progress Notes (Signed)
Pt wants IUD checked because she had a period on 10/25/17 and it lasted for a week and then she had spotting for two weeks. IUD is due to be replaced in 07/2018 and she wants to wait until then to replace it. Pt had pap smear 03/2017 and results were normal.

## 2017-11-19 NOTE — Progress Notes (Signed)
   Subjective:    Patient ID: Elizabeth [name], female    DOB: 11-22-1975, 42 y.o.   MRN: [id number]  HPI  Pt has had IUD Mirena for just over 5 years.  Pt usually has menses once a month lasting 7-8 days.  The past two months, there hsa ben 2 weeks of spotting.  Husband feels strings now during intercourse.  Pt denies pelvic pain or vaginal discharge.  Pt says her PCP could not find her cervix in June and did a blind pap smear.  Will repeat today when cervix visualized.    Review of Systems  Constitutional: Negative.   Respiratory: Negative.   Cardiovascular: Negative.   Gastrointestinal: Negative.   Genitourinary: Positive for menstrual problem and vaginal bleeding. Negative for pelvic pain, vaginal discharge and vaginal pain.  Psychiatric/Behavioral: Negative.        Objective:   Physical Exam  Constitutional: She is oriented to person, place, and time. She appears well-developed and well-nourished. No distress.  HENT:  Head: Normocephalic and atraumatic.  Eyes: Conjunctivae are normal.  Pulmonary/Chest: Effort normal.  Abdominal: Soft. Bowel sounds are normal. She exhibits no distension.  Genitourinary:  Genitourinary Comments: Nml vulva, no visible lesion Cervix--no lesion, strings 2 cm, bluish hue of cervical os at 9 o'clock. Uterus--non tender, limited by habitus.  Musculoskeletal: She exhibits no edema.  Neurological: She is alert and oriented to person, place, and time.  Skin: Skin is warm and dry.  Psychiatric: She has a normal mood and affect.  Vitals reviewed.  Vitals:   11/19/17 0841  BP: 129/85  Pulse: (!) 102  Weight: (!) 301 lb (136.5 kg)  Height: 5\' 3"  (1.6 m)          Assessment & Plan:  42 yo female with IUD 4+ years and irregular bleeding  1-Rpt pap as cervix visualized this exam 2-TVUS to look at endometrium and IUD placement 3-Pt reports nml TSH at PCP 4-RTC 4 weeks with results and plan and pt to continue menstrual calendar.

## 2017-11-21 LAB — CYTOLOGY - PAP
CHLAMYDIA, DNA PROBE: NEGATIVE
DIAGNOSIS: NEGATIVE
HPV (WINDOPATH): NOT DETECTED
Neisseria Gonorrhea: NEGATIVE

## 2017-11-21 LAB — HM PAP SMEAR: HM Pap smear: NORMAL

## 2017-11-24 MED FILL — FLUCONAZOLE 150 MG TABLET: 150 | 1 days supply | Qty: 1 | Fill #0

## 2017-11-26 ENCOUNTER — Ambulatory Visit (HOSPITAL_COMMUNITY)
Admission: RE | Admit: 2017-11-26 | Discharge: 2017-11-26 | Disposition: A | Discharge status: Home / Self Care | Payer: 59 | Source: Ambulatory Visit | Attending: Obstetrics & Gynecology | Admitting: Obstetrics & Gynecology

## 2017-11-26 DIAGNOSIS — D251 Intramural leiomyoma of uterus: Secondary | ICD-10-CM | POA: Diagnosis not present

## 2017-11-26 DIAGNOSIS — N926 Irregular menstruation, unspecified: Secondary | ICD-10-CM | POA: Insufficient documentation

## 2017-11-27 ENCOUNTER — Telehealth: Payer: Self-pay | Admitting: *Deleted

## 2017-11-27 NOTE — Telephone Encounter (Signed)
LM on voicemail to call office to discuss her U/S results

## 2017-11-27 NOTE — Telephone Encounter (Signed)
-----   Message from Guss Bunde, MD sent at 11/26/2017  6:57 PM EST ----- IUD is not positioned correctly.  Suggest removal as efficacy is decreased.  Pt should use condoms until next appt to avoid unintended pregnancy.  RN to call and make sure patient understands.

## 2017-12-09 MED FILL — TRULICITY 0.75 MG/0.5 ML PE: 0.75 | 84 days supply | Qty: 6 | Fill #0

## 2017-12-17 ENCOUNTER — Encounter: Payer: Self-pay | Admitting: Obstetrics & Gynecology

## 2017-12-17 ENCOUNTER — Ambulatory Visit (INDEPENDENT_AMBULATORY_CARE_PROVIDER_SITE_OTHER): Payer: 59 | Admitting: Obstetrics & Gynecology

## 2017-12-17 VITALS — BP 131/84 | HR 97 | Resp 16 | Ht 63.0 in | Wt 304.0 lb

## 2017-12-17 DIAGNOSIS — Z30432 Encounter for removal of intrauterine contraceptive device: Secondary | ICD-10-CM | POA: Diagnosis not present

## 2017-12-17 DIAGNOSIS — N939 Abnormal uterine and vaginal bleeding, unspecified: Secondary | ICD-10-CM | POA: Diagnosis not present

## 2017-12-17 DIAGNOSIS — Z3202 Encounter for pregnancy test, result negative: Secondary | ICD-10-CM

## 2017-12-17 DIAGNOSIS — Z30017 Encounter for initial prescription of implantable subdermal contraceptive: Secondary | ICD-10-CM

## 2017-12-17 LAB — POCT URINE PREGNANCY: PREG TEST UR: NEGATIVE

## 2017-12-17 MED ORDER — ETONOGESTREL 68 MG ~~LOC~~ IMPL
68.0000 mg | DRUG_IMPLANT | Freq: Once | SUBCUTANEOUS | Status: AC
  Administered 2017-12-17: 68 mg via SUBCUTANEOUS

## 2017-12-17 NOTE — Progress Notes (Signed)
   Subjective:    Patient ID: Elizabeth [name], female    DOB: 05/02/1976, 42 y.o.   MRN: [id number]  HPI  Pt presents for results and plan.  Having irregular bleeding on malpositioned Mirena.  Pt has had BTL so it is for bleeding only.  Pt not candidate for OCPS due to medical issues.  Pt does not want another IUD.  Depo has 10# weight gain assoicated with it.  Pt opts for Nexplanon.  Pt understands #1 reason for removal is irregular bleeding.    Review of Systems  Constitutional: Negative.   Respiratory: Negative.   Cardiovascular: Negative.   Gastrointestinal: Negative.   Genitourinary: Positive for menstrual problem and vaginal bleeding.    Physical Exam  Constitutional: She is oriented to person, place, and time. She appears well-developed and well-nourished. No distress.  HENT:  Head: Normocephalic and atraumatic.  Eyes: Conjunctivae are normal.  Pulmonary/Chest: Effort normal.  Abdominal: Soft. Bowel sounds are normal. There is no tenderness.  Musculoskeletal: She exhibits no edema.  Neurological: She is alert and oriented to person, place, and time.  Skin: Skin is warm and dry.  Psychiatric: She has a normal mood and affect.  Vitals reviewed.  Vitals:   12/17/17 0843  BP: 131/84  Pulse: 97  Resp: 16  Weight: (!) 304 lb (137.9 kg)  Height: 5\' 3"  (1.6 m)    Assessment & Plan:  42 yo female with malpositioned IUD and irregular bleeding  1-IUD removal 2-endometrial biospy to make sure that irregular bleeding is not due to hyperplasia or endometrial carcinoma.  Preg test negative 3-Nexplanon insertion  IUD Removal Patient identified, informed consent performed. Discussed risks of irregular bleeding, cramping, infection, malpositioning or misplacement of the IUD outside the uterus which may require further procedures. Time out was performed. Speculum placed in the vagina. Cervix visualized. The strings of the IUD were grasped and pulled using ring forceps. The IUD was  successfully removed in its entirety.   ENDOMETRIAL BIOPSY     The indications for endometrial biopsy were reviewed.   Risks of the biopsy including cramping, bleeding, infection, uterine perforation, inadequate specimen and need for additional procedures  were discussed. The patient states she understands and agrees to undergo procedure today. Consent was signed. Time out was performed. Urine HCG was negative. A sterile speculum was placed in the patient's vagina and the cervix was prepped with Betadine. A single-toothed tenaculum was placed on the anterior lip of the cervix to stabilize it. The 3 mm pipelle was introduced into the endometrial cavity without difficulty to a depth of 9cm, and a moderate amount of tissue was obtained and sent to pathology. The instruments were removed from the patient's vagina. Minimal bleeding from the cervix was noted. The patient tolerated the procedure well. Routine post-procedure instructions were given to the patient. The patient will follow up to review the results and for further management.    NEXPLANON INSERTION UPT was negative. Consent was signed. Time out procedure was performed.  Her left arm was prepped with betadine and infiltrated with 3 cc of 1% lidocaine. After adequate anesthesia was assured, the Nexplanon device was placed between the biceps and triceps approximately 8-10 cm superior to the humeral medial epicondyle.  The Nexplanon was placed superficially by tenting the skin.  Neplanon was palpated in the correct position by myself and patient.  Her arm was hemostatic and was bandaged. Patient tolerated the procedure well and was given appropriate discharge instructions.

## 2017-12-18 ENCOUNTER — Encounter: Payer: Self-pay | Admitting: Obstetrics & Gynecology

## 2017-12-19 HISTORY — PX: ENDOMETRIAL BIOPSY: SHX622

## 2017-12-19 MED FILL — FLUCONAZOLE 150 MG TABS: 150 | 1 days supply | Qty: 1 | Fill #1

## 2017-12-30 MED FILL — glipiZIDE 5 MG TABS: 5 | 30 days supply | Qty: 60 | Fill #1

## 2017-12-30 MED FILL — LOVASTATIN 40 MG TABS: 40 | 60 days supply | Qty: 60 | Fill #1

## 2017-12-30 MED FILL — AMLODIPINE BESYLATE 10 MG T: 10 | 30 days supply | Qty: 30 | Fill #1

## 2017-12-30 MED FILL — FLUCONAZOLE 150 MG TABS: 150 | 1 days supply | Qty: 1 | Fill #0

## 2017-12-30 MED FILL — XIGDUO XR 5 MG-1,000 MG TAB: 5-1000 | 60 days supply | Qty: 120 | Fill #1

## 2017-12-31 DIAGNOSIS — E559 Vitamin D deficiency, unspecified: Secondary | ICD-10-CM | POA: Diagnosis not present

## 2017-12-31 DIAGNOSIS — E1165 Type 2 diabetes mellitus with hyperglycemia: Secondary | ICD-10-CM | POA: Diagnosis not present

## 2017-12-31 LAB — HEMOGLOBIN A1C: HEMOGLOBIN A1C: 7.8

## 2018-01-02 DIAGNOSIS — E559 Vitamin D deficiency, unspecified: Secondary | ICD-10-CM | POA: Diagnosis not present

## 2018-01-02 DIAGNOSIS — E1165 Type 2 diabetes mellitus with hyperglycemia: Secondary | ICD-10-CM | POA: Diagnosis not present

## 2018-01-02 DIAGNOSIS — I10 Essential (primary) hypertension: Secondary | ICD-10-CM | POA: Diagnosis not present

## 2018-01-02 DIAGNOSIS — Z6841 Body Mass Index (BMI) 40.0 and over, adult: Secondary | ICD-10-CM | POA: Diagnosis not present

## 2018-01-14 DIAGNOSIS — E7800 Pure hypercholesterolemia, unspecified: Secondary | ICD-10-CM | POA: Diagnosis not present

## 2018-01-14 DIAGNOSIS — I10 Essential (primary) hypertension: Secondary | ICD-10-CM | POA: Diagnosis not present

## 2018-01-14 DIAGNOSIS — E1165 Type 2 diabetes mellitus with hyperglycemia: Secondary | ICD-10-CM | POA: Diagnosis not present

## 2018-01-21 ENCOUNTER — Encounter: Payer: Self-pay | Admitting: Family

## 2018-01-21 ENCOUNTER — Encounter: Payer: Self-pay | Admitting: Obstetrics & Gynecology

## 2018-01-21 ENCOUNTER — Ambulatory Visit: Payer: 59 | Admitting: Family

## 2018-01-21 ENCOUNTER — Telehealth: Payer: Self-pay | Admitting: *Deleted

## 2018-01-21 VITALS — BP 118/82 | HR 98 | Temp 98.3°F | Resp 18 | Ht 62.5 in | Wt 299.6 lb

## 2018-01-21 DIAGNOSIS — Z23 Encounter for immunization: Secondary | ICD-10-CM | POA: Diagnosis not present

## 2018-01-21 DIAGNOSIS — I10 Essential (primary) hypertension: Secondary | ICD-10-CM

## 2018-01-21 DIAGNOSIS — E785 Hyperlipidemia, unspecified: Secondary | ICD-10-CM | POA: Diagnosis not present

## 2018-01-21 DIAGNOSIS — R5383 Other fatigue: Secondary | ICD-10-CM | POA: Diagnosis not present

## 2018-01-21 DIAGNOSIS — E1165 Type 2 diabetes mellitus with hyperglycemia: Secondary | ICD-10-CM | POA: Diagnosis not present

## 2018-01-21 LAB — CBC WITH DIFFERENTIAL/PLATELET
BASOS ABS: 0.1 10*3/uL (ref 0.0–0.1)
Basophils Relative: 1.2 % (ref 0.0–3.0)
Eosinophils Absolute: 0.1 10*3/uL (ref 0.0–0.7)
Eosinophils Relative: 1.7 % (ref 0.0–5.0)
HEMATOCRIT: 42.4 % (ref 36.0–46.0)
Hemoglobin: 13.3 g/dL (ref 12.0–15.0)
LYMPHS PCT: 31.2 % (ref 12.0–46.0)
Lymphs Abs: 2 10*3/uL (ref 0.7–4.0)
MCHC: 31.5 g/dL (ref 30.0–36.0)
MCV: 72.9 fl — ABNORMAL LOW (ref 78.0–100.0)
MONO ABS: 0.4 10*3/uL (ref 0.1–1.0)
Monocytes Relative: 5.5 % (ref 3.0–12.0)
NEUTROS ABS: 3.9 10*3/uL (ref 1.4–7.7)
NEUTROS PCT: 60.4 % (ref 43.0–77.0)
PLATELETS: 293 10*3/uL (ref 150.0–400.0)
RBC: 5.81 Mil/uL — ABNORMAL HIGH (ref 3.87–5.11)
RDW: 15.6 % — ABNORMAL HIGH (ref 11.5–15.5)
WBC: 6.4 10*3/uL (ref 4.0–10.5)

## 2018-01-21 LAB — COMPREHENSIVE METABOLIC PANEL
ALBUMIN: 3.8 g/dL (ref 3.5–5.2)
ALT: 23 U/L (ref 0–35)
AST: 14 U/L (ref 0–37)
Alkaline Phosphatase: 78 U/L (ref 39–117)
BUN: 11 mg/dL (ref 6–23)
CHLORIDE: 103 meq/L (ref 96–112)
CO2: 23 meq/L (ref 19–32)
CREATININE: 0.61 mg/dL (ref 0.40–1.20)
Calcium: 9.1 mg/dL (ref 8.4–10.5)
GFR: 139.98 mL/min (ref >60.00)
Glucose, Bld: 172 mg/dL — ABNORMAL HIGH (ref 70–99)
Potassium: 4 mEq/L (ref 3.5–5.1)
SODIUM: 136 meq/L (ref 135–145)
Total Bilirubin: 0.3 mg/dL (ref 0.2–1.2)
Total Protein: 7.1 g/dL (ref 6.0–8.3)

## 2018-01-21 LAB — LIPID PANEL
CHOL/HDL RATIO: 4
Cholesterol: 161 mg/dL (ref 0–200)
HDL: 44.7 mg/dL (ref >39.00)
LDL CALC: 102 mg/dL — AB (ref 0–99)
NonHDL: 115.89
Triglycerides: 71 mg/dL (ref 0.0–149.0)
VLDL: 14.2 mg/dL (ref 0.0–40.0)

## 2018-01-21 LAB — TSH: TSH: 1.67 u[IU]/mL (ref 0.35–4.50)

## 2018-01-21 MED ORDER — ETONOGESTREL 68 MG ~~LOC~~ IMPL: 1.0000 | DRUG_IMPLANT | Freq: Once | SUBCUTANEOUS | 0 refills | Status: DC

## 2018-01-21 NOTE — Telephone Encounter (Signed)
-----   Message from [name], NP sent at 01/21/2018  8:04 AM EDT ----- Could you please abstract her A1C?   Component Value Date  HBA1C 7.8 (H) 12/31/2017

## 2018-01-21 NOTE — Progress Notes (Signed)
Subjective:    Patient ID: Elizabeth [name], female    DOB: [birth year], 42 y.o.   MRN: [id number]  HPI   Elizabeth [name] is a 42 yr old female who present today to establish care.  Pmhx is significant for the following:  HTN- maintained on lisinopril 40mg  once daily and amlodipine. Denies cough or swelling.  BP Readings from Last 3 Encounters:  01/21/18 (!) 143/93  12/17/17 131/84  11/19/17 129/85   Hyperlipidemia-  She is maintained on mevacor. She reports that she has been on this med x 2 years. Denies myalgia.   DM2- maintained on glucotrol, And Xigduo and trulicity.  Reports that last a1c was 7.8. Sees Dr. Meredith Pel (she is in [facility]).  Irregular menses- she is followed by Dr. Silas [place] and had nexplanon placed on 2/27.  Reports that she has been bleeding for "almost a month."    Obesity- reports that her last week was 306 at endo. She does not exercise.  Reports that her diet is improving.  She is no longer drinking sugared beverages and has cut back on carbs.  She plans to start walking.  Wt Readings from Last 3 Encounters:  01/21/18 299 lb 9.6 oz (135.9 kg)  12/17/17 (!) 304 lb (137.9 kg)  11/19/17 (!) 301 lb (136.5 kg)   Reports 1 week history of fatigue.   Review of Systems  Constitutional: Negative for unexpected weight change.  HENT: Negative for hearing loss and rhinorrhea.   Eyes: Negative for visual disturbance.  Respiratory: Negative for cough.   Cardiovascular: Negative for leg swelling.  Gastrointestinal: Negative for constipation and diarrhea.  Genitourinary: Negative for dysuria and frequency.  Musculoskeletal: Negative for arthralgias and myalgias.  Skin: Negative for rash.  Neurological: Negative for headaches.  Hematological: Negative for adenopathy.  Psychiatric/Behavioral:       Denies depression/anxiety   Past Medical History:  Diagnosis Date  . Diabetes mellitus without complication ([place])   . High cholesterol   . Hypertension      Social  History   Socioeconomic History  . Marital status: Married    Spouse name: Not on file  . Number of children: Not on file  . Years of education: Not on file  . Highest education level: Not on file  Occupational History  . Not on file  Social Needs  . Financial resource strain: Not on file  . Food insecurity:    Worry: Not on file    Inability: Not on file  . Transportation needs:    Medical: Not on file    Non-medical: Not on file  Tobacco Use  . Smoking status: Former Smoker    Years: 3.00  . Smokeless tobacco: Never Used  Substance and Sexual Activity  . Alcohol use: No  . Drug use: No  . Sexual activity: Yes    Birth control/protection: Surgical, IUD  Lifestyle  . Physical activity:    Days per week: Not on file    Minutes per session: Not on file  . Stress: Not on file  Relationships  . Social connections:    Talks on phone: Not on file    Gets together: Not on file    Attends religious service: Not on file    Active member of club or organization: Not on file    Attends meetings of clubs or organizations: Not on file    Relationship status: Not on file  . Intimate partner violence:    Fear of current or ex  partner: Not on file    Emotionally abused: Not on file    Physically abused: Not on file    Forced sexual activity: Not on file  Other Topics Concern  . Not on file  Social History Narrative   Married 2 children   1997- son Allen Kell   2000- son Insurance claims handler in [place] in [place]   Enjoys reading    Past Surgical History:  Procedure Laterality Date  . DILATION AND CURETTAGE OF UTERUS    . ENDOMETRIAL BIOPSY  12/19/2017   normal per pt  . TONSILLECTOMY    . TUBAL LIGATION      Family History  Problem Relation Age of Onset  . Diabetes Mother   . Hypertension Mother   . Ovarian cancer Maternal Grandmother   . HIV Father     No Known Allergies  Current Outpatient Medications on File Prior to Visit  Medication Sig  Dispense Refill  . amLODipine (NORVASC) 10 MG tablet Take 10 mg by mouth daily.    . Dapagliflozin-metFORMIN HCl ER 02-999 MG TB24 Take by mouth.    [name] glipiZIDE (GLUCOTROL) 10 MG tablet Take 10 mg by mouth 2 (two) times daily before a meal.    . levonorgestrel (MIRENA) 20 MCG/24HR IUD 1 each by Intrauterine route once.    [name] lisinopril (PRINIVIL,ZESTRIL) 40 MG tablet Take 40 mg by mouth daily.    [name] lovastatin (MEVACOR) 40 MG tablet Take 1 tablet by mouth at bedtime.    . TRULICITY 2.70 JJ/0.0XF SOPN   0   No current facility-administered medications on file prior to visit.     BP (!) 143/93 (BP Location: Left Arm, Cuff Size: Large) Comment: Pt states "she just took her medication"  Pulse 98   Temp 98.3 F (36.8 C) (Oral)   Resp 18   Ht 5' 2.5" (1.588 m)   Wt 299 lb 9.6 oz (135.9 kg)   LMP 12/19/2017   SpO2 100%   BMI 53.92 kg/m       Objective:   Physical Exam  Constitutional: She is oriented to person, place, and time. She appears well-developed and well-nourished.  HENT:  Head: Normocephalic and atraumatic.  Eyes: Conjunctivae are normal. No scleral icterus.  Neck: Neck supple. No thyromegaly present.  Cardiovascular: Normal rate, regular rhythm and normal heart sounds.  No murmur heard. Pulmonary/Chest: Effort normal and breath sounds normal. No respiratory distress. She has no wheezes.  Musculoskeletal: She exhibits no edema.  Lymphadenopathy:    She has no cervical adenopathy.  Neurological: She is alert and oriented to person, place, and time.  Skin: Skin is warm and dry.  Psychiatric: She has a normal mood and affect. Her behavior is normal. Judgment and thought content normal.          Assessment & Plan:  Fatigue- check cbc, TSH.  If persists would consider work up for OSA due to habitus.  DM2 uncontrolled- this is being managed by endocrinology. Pneumovax up to date.  HTN-  Fair control on current meds.  Hyperlipidemia- tolerating statin, obtain follow  up lipid panel.

## 2018-01-21 NOTE — Patient Instructions (Addendum)
Please complete lab work prior to leaving. Try to add some regular walking and continue to work on healthy diabetic diet and weight loss. Welcome to [facility]!

## 2018-01-21 NOTE — Telephone Encounter (Signed)
Result abstracted 

## 2018-01-22 MED FILL — MELOXICAM 7.5 MG TABLET: 7.5 | 90 days supply | Qty: 90 | Fill #0

## 2018-01-28 ENCOUNTER — Encounter: Payer: Self-pay | Admitting: Obstetrics and Gynecology

## 2018-01-28 ENCOUNTER — Ambulatory Visit (INDEPENDENT_AMBULATORY_CARE_PROVIDER_SITE_OTHER): Payer: 59 | Admitting: Obstetrics and Gynecology

## 2018-01-28 VITALS — BP 138/91 | HR 85 | Resp 16 | Ht 63.0 in | Wt 298.0 lb

## 2018-01-28 DIAGNOSIS — N938 Other specified abnormal uterine and vaginal bleeding: Secondary | ICD-10-CM

## 2018-01-28 MED ORDER — NORETHINDRONE 0.35 MG PO TABS: 1.0000 | ORAL_TABLET | Freq: Every day | ORAL | 11 refills | Status: DC

## 2018-01-28 MED ORDER — NYSTATIN-TRIAMCINOLONE 100000-0.1 UNIT/GM-% EX CREA: 1.0000 "application " | TOPICAL_CREAM | Freq: Two times a day (BID) | CUTANEOUS | 0 refills | Status: DC

## 2018-01-28 MED FILL — NYSTATIN-TRIAMCINOLONE CRM: 100000-0.1 | 15 days supply | Qty: 30 | Fill #0

## 2018-01-28 MED FILL — NORETHINDRONE 0.35 MG TAB: 0.35 | 84 days supply | Qty: 84 | Fill #0

## 2018-01-28 NOTE — Progress Notes (Signed)
42 yo G2P2 with history of DUB currently managed with Nexplanon. Patient previously had a Mirena IUD which was removed due to being malpositioned. Patient with normal pelvic ultrasound and endometrial biopsy early this year. Patient reports heavier flow when working with passage of small clots. Patient also reports the presence of skin sores over vulva for the past 5 days which seems to have increased in size. Area is tender to touch  Past Medical History:  Diagnosis Date  . Diabetes mellitus without complication ([place])   . High cholesterol   . Hypertension    Past Surgical History:  Procedure Laterality Date  . DILATION AND CURETTAGE OF UTERUS    . ENDOMETRIAL BIOPSY  12/19/2017   normal per pt  . TONSILLECTOMY    . TUBAL LIGATION     Family History  Problem Relation Age of Onset  . Diabetes Mother   . Hypertension Mother   . Ovarian cancer Maternal Grandmother   . HIV Father    Social History   Tobacco Use  . Smoking status: Former Smoker    Years: 3.00  . Smokeless tobacco: Never Used  Substance Use Topics  . Alcohol use: No  . Drug use: No   ROS See pertinent in HPI  Blood pressure (!) 138/91, pulse 85, resp. rate 16, height 5\' 3"  (1.6 m), weight 298 lb (135.2 kg). GENERAL: Well-developed, well-nourished female in no acute distress.  PELVIC: Normal external female genitalia with a 1 cm open sore on right labia majora. No underlying evidence of abscess or cellulitis (no fluctuance or erythema) NEURO: alert and oriented x 3  A/P 42 yo with DUB and Nexplanon - reviewed with patient that irregular bleeding associated with nexplanon can persist for up to a year following insertion - discussed the benefits of adding birth control pills to regimen to aid with DUB - Rx POP provided given CHTN - Rx nystatin provided - Patient advised to ensure diabetes is well controlled - RTC prn

## 2018-02-02 MED FILL — glipiZIDE 5 MG TABS: 5 | 90 days supply | Qty: 180 | Fill #0

## 2018-02-02 MED FILL — AMLODIPINE BESYLATE 10 MG T: 10 | 90 days supply | Qty: 90 | Fill #0

## 2018-02-06 MED FILL — LISINOPRIL 40 MG TABS: 40 | 90 days supply | Qty: 90 | Fill #1

## 2018-02-24 MED FILL — FLUCONAZOLE 150 MG TABS: 150 | 1 days supply | Qty: 1 | Fill #1

## 2018-02-25 ENCOUNTER — Ambulatory Visit (INDEPENDENT_AMBULATORY_CARE_PROVIDER_SITE_OTHER): Payer: 59 | Admitting: Family

## 2018-02-25 ENCOUNTER — Encounter: Payer: Self-pay | Admitting: Family

## 2018-02-25 VITALS — BP 139/82 | HR 109 | Temp 99.1°F | Resp 16 | Ht 62.0 in | Wt 299.6 lb

## 2018-02-25 DIAGNOSIS — Z0000 Encounter for general adult medical examination without abnormal findings: Secondary | ICD-10-CM

## 2018-02-25 LAB — URINALYSIS, ROUTINE W REFLEX MICROSCOPIC
Ketones, ur: 15 — AB
Leukocytes, UA: NEGATIVE
Nitrite: NEGATIVE
PH: 6 (ref 5.0–8.0)
UROBILINOGEN UA: 0.2 (ref 0.0–1.0)

## 2018-02-25 NOTE — Patient Instructions (Addendum)
Please complete lab work prior to leaving.  Please continue to work on healthy diet, exercise, and weight loss.

## 2018-02-25 NOTE — Progress Notes (Signed)
Subjective:    Patient ID: Elizabeth [name], female    DOB: 26-Aug-1976, 42 y.o.   MRN: [id number]  HPI  Patient presents today for complete physical.  Immunizations: up to date Diet: fair diet Breakfast-  Biscuit or cereal, water Lunch- sandwich (some from home sometimes buys) chips or fries Dinner- sometimes cooks, eats a lot of chicken (tgif chicken, broccoli/cheese frozen) sometimes spaghetti) tacos Some green tea with honey (arizona) Coke zero Exercise: not exercising due to menstrual bleeding.   Dental: up date Vision: scheduled  Review of Systems  Constitutional: Negative for unexpected weight change.  HENT: Negative for hearing loss and rhinorrhea.   Eyes: Negative for visual disturbance.  Respiratory: Negative for cough.   Cardiovascular: Negative for leg swelling.  Gastrointestinal: Negative for constipation and diarrhea.  Genitourinary: Positive for menstrual problem. Negative for dysuria and frequency.  Musculoskeletal: Negative for arthralgias and myalgias.  Skin: Negative for rash.  Neurological: Negative for headaches.  Hematological: Negative for adenopathy.  Psychiatric/Behavioral:       Denies depression/anxiety   Past Medical History:  Diagnosis Date  . Diabetes mellitus without complication ([place])   . High cholesterol   . Hypertension      Social History   Socioeconomic History  . Marital status: Married    Spouse name: Not on file  . Number of children: Not on file  . Years of education: Not on file  . Highest education level: Not on file  Occupational History  . Not on file  Social Needs  . Financial resource strain: Not on file  . Food insecurity:    Worry: Not on file    Inability: Not on file  . Transportation needs:    Medical: Not on file    Non-medical: Not on file  Tobacco Use  . Smoking status: Former Smoker    Years: 3.00  . Smokeless tobacco: Never Used  Substance and Sexual Activity  . Alcohol use: No  . Drug use: No  .  Sexual activity: Yes    Birth control/protection: Surgical, Implant  Lifestyle  . Physical activity:    Days per week: Not on file    Minutes per session: Not on file  . Stress: Not on file  Relationships  . Social connections:    Talks on phone: Not on file    Gets together: Not on file    Attends religious service: Not on file    Active member of club or organization: Not on file    Attends meetings of clubs or organizations: Not on file    Relationship status: Not on file  . Intimate partner violence:    Fear of current or ex partner: Not on file    Emotionally abused: Not on file    Physically abused: Not on file    Forced sexual activity: Not on file  Other Topics Concern  . 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 DILATION AND CURETTAGE OF UTERUS    . ENDOMETRIAL BIOPSY  12/19/2017   normal per pt  . TONSILLECTOMY    . TUBAL LIGATION      Family History  Problem Relation Age of Onset  . Diabetes Mother   . Hypertension Mother   . Ovarian cancer Maternal Grandmother   .  HIV Father     No Known Allergies  Current Outpatient Medications on File Prior to Visit  Medication Sig Dispense Refill  . albuterol (PROVENTIL HFA;VENTOLIN HFA) 108 (90 Base) MCG/ACT inhaler Inhale into the lungs.    [name] amLODipine (NORVASC) 10 MG tablet Take 10 mg by mouth daily.    . Dapagliflozin-metFORMIN HCl ER 02-999 MG TB24 Take by mouth.    [name] glipiZIDE (GLUCOTROL) 10 MG tablet Take 10 mg by mouth 2 (two) times daily before a meal.    . lisinopril (PRINIVIL,ZESTRIL) 40 MG tablet Take 40 mg by mouth daily.    [name] lovastatin (MEVACOR) 40 MG tablet Take 1 tablet by mouth at bedtime.    . meloxicam (MOBIC) 7.5 MG tablet   1  . norethindrone (MICRONOR,CAMILA,ERRIN) 0.35 MG tablet Take 1 tablet (0.35 mg total)  by mouth daily. 1 Package 11  . nystatin-triamcinolone (MYCOLOG II) cream Apply 1 application topically 2 (two) times daily. 30 g 0  . TRULICITY 3.81 OF/7.5ZW SOPN   0  . etonogestrel (NEXPLANON) 68 MG IMPL implant 1 each (68 mg total) by Subdermal route once for 1 dose. 1 each 0   No current facility-administered medications on file prior to visit.     BP 139/82 (BP Location: Left Arm, Patient Position: Sitting, Cuff Size: Large)   Pulse (!) 109   Temp 99.1 F (37.3 C) (Oral)   Resp 16   Ht 5\' 2"  (1.575 m)   Wt 299 lb 9.6 oz (135.9 kg)   SpO2 99%   BMI 54.80 kg/m       Objective:   Physical Exam  Physical Exam  Constitutional: She is oriented to person, place, and time. She appears well-developed and well-nourished. No distress.  HENT:  Head: Normocephalic and atraumatic.  Right Ear: Tympanic membrane and ear canal normal.  Left Ear: Tympanic membrane and ear canal normal.  Mouth/Throat: Oropharynx is clear and moist.  Eyes: Pupils are equal, round, and reactive to light. No scleral icterus.  Neck: Normal range of motion. No thyromegaly present.  Cardiovascular: Normal rate and regular rhythm.   No murmur heard. Pulmonary/Chest: Effort normal and breath sounds normal. No respiratory distress. He has no wheezes. She has no rales. She exhibits no tenderness.  Abdominal: Soft. Bowel sounds are normal. She exhibits no distension and no mass. There is no tenderness. There is no rebound and no guarding.  Musculoskeletal: She exhibits no edema.  Lymphadenopathy:    She has no cervical adenopathy.  Neurological: She is alert and oriented to person, place, and time. She has normal patellar reflexes. She exhibits normal muscle tone. Coordination normal.  Skin: Skin is warm and dry.  Psychiatric: She has a normal mood and affect. Her behavior is normal. Judgment and thought content normal.  Breasts: Examined lying Right: Without masses, retractions, discharge or axillary adenopathy.   Left: Without masses, retractions, discharge or axillary adenopathy.  Pelvic: deferred         Assessment & Plan:    Preventative- discussed healthy diet, exercise, weight loss.  Immunizations reviewed and up to date. Obtain UA with micro- other lab work done last visit. Refer for mammogram. Pap up to date.      Assessment & Plan:

## 2018-02-27 ENCOUNTER — Ambulatory Visit (INDEPENDENT_AMBULATORY_CARE_PROVIDER_SITE_OTHER): Payer: Self-pay | Admitting: Nurse Practitioner

## 2018-02-27 ENCOUNTER — Telehealth: Payer: Self-pay | Admitting: Family

## 2018-02-27 VITALS — BP 110/90 | HR 91 | Temp 98.4°F | Resp 18 | Wt 300.2 lb

## 2018-02-27 DIAGNOSIS — J309 Allergic rhinitis, unspecified: Secondary | ICD-10-CM

## 2018-02-27 DIAGNOSIS — J029 Acute pharyngitis, unspecified: Secondary | ICD-10-CM

## 2018-02-27 LAB — POCT RAPID STREP A (OFFICE): Rapid Strep A Screen: NEGATIVE

## 2018-02-27 MED ORDER — NITROFURANTOIN MONOHYD MACRO 100 MG PO CAPS: 100.0000 mg | ORAL_CAPSULE | Freq: Two times a day (BID) | ORAL | 0 refills | Status: DC

## 2018-02-27 MED ORDER — MONTELUKAST SODIUM 10 MG PO TABS: 10.0000 mg | ORAL_TABLET | Freq: Every day | ORAL | 1 refills | Status: DC

## 2018-02-27 MED ORDER — FLUTICASONE PROPIONATE 50 MCG/ACT NA SUSP: 2.0000 | Freq: Every day | NASAL | 0 refills | Status: DC

## 2018-02-27 MED FILL — FLUTICASONE PROP 50 MCG SPR: 50 | 30 days supply | Qty: 16 | Fill #0

## 2018-02-27 MED FILL — TRULICITY 0.75 MG/0.5 ML PE: 0.75 | 28 days supply | Qty: 2 | Fill #0

## 2018-02-27 MED FILL — NITROFURANTOIN MONO-MCR 100: 100 | 5 days supply | Qty: 10 | Fill #0

## 2018-02-27 MED FILL — MONTELUKAST SOD 10 MG TAB: 10 | 30 days supply | Qty: 30 | Fill #0

## 2018-02-27 NOTE — Progress Notes (Addendum)
Subjective:     Elizabeth [name] is a 42 y.o. female who presents for evaluation and treatment of allergic symptoms. Symptoms include: clear rhinorrhea, headaches, nasal congestion, postnasal drip, sinus pressure and sore throat and are present in a seasonal pattern. Precipitants include: pollen. Treatment currently includes oral antihistamines: Claritin and is not effective. The following portions of the patient's history were reviewed and updated as appropriate: allergies, current medications and past medical history.  Review of Systems Constitutional: positive for chills and fatigue, negative for anorexia, malaise and sweats Eyes: negative Ears, nose, mouth, throat, and face: positive for sore throat and ear pressure/fullness, sinus pressure, negative for ear drainage and hoarseness Respiratory: positive for cough and bronchitis, negative for asthma, dyspnea on exertion, sputum, stridor and wheezing Cardiovascular: negative Gastrointestinal: negative Neurological: positive for headaches, negative for coordination problems, dizziness, gait problems, tremors, vertigo and weakness Allergic/Immunologic: negative    Objective:    BP 110/90 (BP Location: Left Arm, Patient Position: Sitting, Cuff Size: Large)   Pulse 91   Temp 98.4 F (36.9 C) (Oral)   Resp 18   Wt (!) 300 lb 3.2 oz (136.2 kg)   SpO2 98%   BMI 54.91 kg/m  General appearance: alert, cooperative, fatigued and no distress Head: Normocephalic, without obvious abnormality, atraumatic Eyes: conjunctivae/corneas clear. PERRL, EOM's intact. Fundi benign. Ears: normal TM and external ear canal left ear and abnormal TM right ear - mucoid middle ear fluid Nose: clear discharge, turbinates swollen, inflamed, mild maxillary sinus tenderness bilateral, mild frontal sinus tenderness bilateral Throat: abnormal findings: mild oropharyngeal erythema Lungs: clear to auscultation bilaterally Heart: regular rate and rhythm, S1, S2 normal, no  murmur, click, rub or gallop Abdomen: soft, non-tender; bowel sounds normal; no masses,  no organomegaly Pulses: 2+ and symmetric Skin: Skin color, texture, turgor normal. No rashes or lesions Lymph nodes: cervical and submandibular nodes normal Neurologic: Grossly normal    Assessment:    Allergic rhinitis.    Plan:   1.  Singulair 1 tablet at bedtime daily until symptoms improve. 2.  Fluticasone nasal spray 2 sprays in each nostril daily for 10 days. 3.  Patient instructed to increase fluids, use humidifier or vaporizer at home, and use nasal saline spray as needed. 4.  Patient instructed to stay inside during days with high pollen count. 5.  Patient instructed to gargle with warm salt water rinses first thing in the morning.  Ibuprofen or Tylenol for pain fever or general discomfort.  Patient also can use cough drops, honey, warm or cool liquids for throat pain or discomfort. 6.  Patient instructed to follow-up if symptoms rapidly worsen or she has no improvement in the next 5 to 7 days. 7.  Patient education provided. 8.  Patient verbalizes understanding and has no questions at time of discharge. Meds ordered this encounter  Medications  . montelukast (SINGULAIR) 10 MG tablet    Sig: Take 1 tablet (10 mg total) by mouth at bedtime.    Dispense:  30 tablet    Refill:  1    Order Specific Question:   Supervising Provider    Answer:   Ricard Dillon [[id number]]  . fluticasone (FLONASE) 50 MCG/ACT nasal spray    Sig: Place 2 sprays into both nostrils daily for 10 days.    Dispense:  16 g    Refill:  0    Order Specific Question:   Supervising Provider    Answer:   Ricard Dillon [phone]

## 2018-02-27 NOTE — Telephone Encounter (Signed)
Urine showed a lot of blood, was she having menstrual cycle that day? Also shows possible UTI. Rx sent for abx to her pharmacy.

## 2018-02-27 NOTE — Patient Instructions (Signed)
Allergic Rhinitis, Adult Allergic rhinitis is an allergic reaction that affects the mucous membrane inside the nose. It causes sneezing, a runny or stuffy nose, and the feeling of mucus going down the back of the throat (postnasal drip). Allergic rhinitis can be mild to severe. There are two types of allergic rhinitis:  Seasonal. This type is also called hay fever. It happens only during certain seasons.  Perennial. This type can happen at any time of the year.  What are the causes? This condition happens when the body's defense system (immune system) responds to certain harmless substances called allergens as though they were germs.  Seasonal allergic rhinitis is triggered by pollen, which can come from grasses, trees, and weeds. Perennial allergic rhinitis may be caused by:  House dust mites.  Pet dander.  Mold spores.  What are the signs or symptoms? Symptoms of this condition include:  Sneezing.  Runny or stuffy nose (nasal congestion).  Postnasal drip.  Itchy nose.  Tearing of the eyes.  Trouble sleeping.  Daytime sleepiness.  How is this diagnosed? This condition may be diagnosed based on:  Your medical history.  A physical exam.  Tests to check for related conditions, such as: ? Asthma. ? Pink eye. ? Ear infection. ? Upper respiratory infection.  Tests to find out which allergens trigger your symptoms. These may include skin or blood tests.  How is this treated? There is no cure for this condition, but treatment can help control symptoms. Treatment may include:  Taking medicines that block allergy symptoms, such as antihistamines. Medicine may be given as a shot, nasal spray, or pill.  Avoiding the allergen.  Desensitization. This treatment involves getting ongoing shots until your body becomes less sensitive to the allergen. This treatment may be done if other treatments do not help.  If taking medicine and avoiding the allergen does not work, new,  stronger medicines may be prescribed.  Follow these instructions at home:  Find out what you are allergic to. Common allergens include smoke, dust, and pollen.  Avoid the things you are allergic to. These are some things you can do to help avoid allergens: ? Replace carpet with wood, tile, or vinyl flooring. Carpet can trap dander and dust. ? Do not smoke. Do not allow smoking in your home. ? Change your heating and air conditioning filter at least once a month. ? During allergy season:  Keep windows closed as much as possible.  Plan outdoor activities when pollen counts are lowest. This is usually during the evening hours.  When coming indoors, change clothing and shower before sitting on furniture or bedding.  Take over-the-counter and prescription medicines only as told by your health care provider.  Keep all follow-up visits as told by your health care provider. This is important. Contact a health care provider if:  You have a fever.  You develop a persistent cough.  You make whistling sounds when you breathe (you wheeze).  Your symptoms interfere with your normal daily activities. Get help right away if:  You have shortness of breath. Summary  This condition can be managed by taking medicines as directed and avoiding allergens.  Contact your health care provider if you develop a persistent cough or fever.  During allergy season, keep windows closed as much as possible. This information is not intended to replace advice given to you by your health care provider. Make sure you discuss any questions you have with your health care provider. Document Released: 07/02/2001 Document Revised: 11/14/2016  Document Reviewed: 11/14/2016 Elsevier Interactive Patient Education  2018 [employer].  Sore Throat A sore throat is pain, burning, irritation, or scratchiness in the throat. When you have a sore throat, you may feel pain or tenderness in your throat when you swallow or  talk. Many things can cause a sore throat, including:  An infection.  Seasonal allergies.  Dryness in the air.  Irritants, such as smoke or pollution.  Gastroesophageal reflux disease (GERD).  A tumor.  A sore throat is often the first sign of another sickness. It may happen with other symptoms, such as coughing, sneezing, fever, and swollen neck glands. Most sore throats go away without medical treatment. Follow these instructions at home:  Take over-the-counter medicines only as told by your health care provider.  Drink enough fluids to keep your urine clear or pale yellow.  Rest as needed.  To help with pain, try: ? Sipping warm liquids, such as broth, herbal tea, or warm water. ? Eating or drinking cold or frozen liquids, such as frozen ice pops. ? Gargling with a salt-water mixture 3-4 times a day or as needed. To make a salt-water mixture, completely dissolve -1 tsp of salt in 1 cup of warm water. ? Sucking on hard candy or throat lozenges. ? Putting a cool-mist humidifier in your bedroom at night to moisten the air. ? Sitting in the bathroom with the door closed for 5-10 minutes while you run hot water in the shower.  Do not use any tobacco products, such as cigarettes, chewing tobacco, and e-cigarettes. If you need help quitting, ask your health care provider. Contact a health care provider if:  You have a fever for more than 2-3 days.  You have symptoms that last (are persistent) for more than 2-3 days.  Your throat does not get better within 7 days.  You have a fever and your symptoms suddenly get worse. Get help right away if:  You have difficulty breathing.  You cannot swallow fluids, soft foods, or your saliva.  You have increased swelling in your throat or neck.  You have persistent nausea and vomiting. This information is not intended to replace advice given to you by your health care provider. Make sure you discuss any questions you have with your  health care provider. Document Released: 11/14/2004 Document Revised: 06/02/2016 Document Reviewed: 07/28/2015 Elsevier Interactive Patient Education  [employer].

## 2018-03-01 ENCOUNTER — Other Ambulatory Visit: Payer: Self-pay | Admitting: Nurse Practitioner

## 2018-03-01 ENCOUNTER — Encounter: Payer: Self-pay | Admitting: Nurse Practitioner

## 2018-03-01 ENCOUNTER — Telehealth: Payer: Self-pay | Admitting: Nurse Practitioner

## 2018-03-01 MED ORDER — BENZONATATE 200 MG PO CAPS: 200.0000 mg | ORAL_CAPSULE | Freq: Three times a day (TID) | ORAL | 0 refills | Status: AC | PRN

## 2018-03-01 MED ORDER — IPRATROPIUM BROMIDE 0.02 % IN SOLN: 0.5000 mg | Freq: Four times a day (QID) | RESPIRATORY_TRACT | 12 refills | Status: DC

## 2018-03-01 NOTE — Telephone Encounter (Signed)
Patient called stating she is not feeling much better.  Patient has developed a cough and has continued nasal congestion.  Patient started Mucinex on 5/11.  Informed patient to continue use of Mucinex.  I will call in another nasal inhaler, stop Flonase, and Tessalon Perles.  Discussed with patient the avoidance of using abx unnecessarily.  Patient requested medications be sent to [facility] and she will pick them up on 5/13.  Informed patient I will follow up with her on 5/14.

## 2018-03-02 ENCOUNTER — Encounter: Payer: Self-pay | Admitting: Family

## 2018-03-02 ENCOUNTER — Other Ambulatory Visit: Payer: Self-pay | Admitting: Nurse Practitioner

## 2018-03-02 MED ORDER — ALBUTEROL SULFATE HFA 108 (90 BASE) MCG/ACT IN AERS: 2.0000 | INHALATION_SPRAY | Freq: Four times a day (QID) | RESPIRATORY_TRACT | 0 refills | Status: DC | PRN

## 2018-03-02 MED FILL — IPRATROPIUM BR 0.02% SOLN: 0.02 | 8 days supply | Qty: 75 | Fill #0

## 2018-03-02 MED FILL — BENZONATATE 200 MG CAP: 200 | 10 days supply | Qty: 30 | Fill #0

## 2018-03-02 MED FILL — VENTOLIN HFA 90 MCG INHALER: 108 (90 BAS | 25 days supply | Qty: 18 | Fill #0

## 2018-03-02 NOTE — Progress Notes (Signed)
Patient sent request via MyChart to refill albuterol.  Order sent for albuteron inhaler to [facility].

## 2018-03-02 NOTE — Telephone Encounter (Signed)
See my chart message from pt.

## 2018-03-06 MED FILL — XIGDUO XR 5 MG-1,000 MG TAB: 5-1000 | 90 days supply | Qty: 180 | Fill #0

## 2018-03-07 ENCOUNTER — Ambulatory Visit (HOSPITAL_BASED_OUTPATIENT_CLINIC_OR_DEPARTMENT_OTHER)
Admission: RE | Admit: 2018-03-07 | Discharge: 2018-03-07 | Disposition: A | Discharge status: Home / Self Care | Payer: 59 | Source: Ambulatory Visit | Attending: Family | Admitting: Family

## 2018-03-07 ENCOUNTER — Encounter (HOSPITAL_BASED_OUTPATIENT_CLINIC_OR_DEPARTMENT_OTHER): Payer: Self-pay

## 2018-03-07 DIAGNOSIS — Z0000 Encounter for general adult medical examination without abnormal findings: Secondary | ICD-10-CM | POA: Diagnosis not present

## 2018-03-07 DIAGNOSIS — Z1231 Encounter for screening mammogram for malignant neoplasm of breast: Secondary | ICD-10-CM | POA: Diagnosis not present

## 2018-04-03 MED FILL — MONTELUKAST SOD 10 MG TAB: 10 | 30 days supply | Qty: 30 | Fill #1

## 2018-04-03 MED FILL — TRULICITY 0.75 MG/0.5 ML PE: 0.75 | 28 days supply | Qty: 2 | Fill #1

## 2018-04-20 ENCOUNTER — Other Ambulatory Visit: Payer: Self-pay | Admitting: Obstetrics and Gynecology

## 2018-04-20 MED FILL — NORETHINDRONE 0.35 MG TAB: 0.35 | 84 days supply | Qty: 84 | Fill #1

## 2018-04-22 MED FILL — NYSTATIN-TRIAMCINOLONE CRM: 100000-0.1 | 15 days supply | Qty: 30 | Fill #0

## 2018-05-05 MED FILL — glipiZIDE 5 MG TABS: 5 | 90 days supply | Qty: 180 | Fill #1

## 2018-05-05 MED FILL — TRULICITY 0.75 MG/0.5 ML PE: 0.75 | 28 days supply | Qty: 2 | Fill #2

## 2018-05-05 MED FILL — AMLODIPINE BESYLATE 10 MG T: 10 | 90 days supply | Qty: 90 | Fill #1

## 2018-05-06 ENCOUNTER — Encounter: Payer: Self-pay | Admitting: Family

## 2018-05-06 MED ORDER — LISINOPRIL 40 MG PO TABS: 40.0000 mg | ORAL_TABLET | Freq: Every day | ORAL | 1 refills | Status: DC

## 2018-05-06 MED FILL — LISINOPRIL 40 MG TABS: 40 | 90 days supply | Qty: 90 | Fill #0

## 2018-05-06 MED FILL — LOVASTATIN 40 MG TABS: 40 | 90 days supply | Qty: 90 | Fill #0

## 2018-05-11 MED FILL — FLUCONAZOLE 150 MG TABS: 150 | 1 days supply | Qty: 1 | Fill #0

## 2018-05-29 DIAGNOSIS — E559 Vitamin D deficiency, unspecified: Secondary | ICD-10-CM | POA: Diagnosis not present

## 2018-05-29 DIAGNOSIS — E1165 Type 2 diabetes mellitus with hyperglycemia: Secondary | ICD-10-CM | POA: Diagnosis not present

## 2018-05-29 DIAGNOSIS — Z6841 Body Mass Index (BMI) 40.0 and over, adult: Secondary | ICD-10-CM | POA: Diagnosis not present

## 2018-05-29 DIAGNOSIS — I10 Essential (primary) hypertension: Secondary | ICD-10-CM | POA: Diagnosis not present

## 2018-05-29 MED FILL — XIGDUO XR 5 MG-1,000 MG TAB: 5-1000 | 90 days supply | Qty: 180 | Fill #1

## 2018-05-29 MED FILL — TRULICITY 1.5 MG/0.5 ML PEN: 1.5 | 28 days supply | Qty: 2 | Fill #0

## 2018-06-01 MED FILL — FLUCONAZOLE 150 MG TABS: 150 | 1 days supply | Qty: 1 | Fill #1

## 2018-06-19 MED FILL — FLUCONAZOLE 150 MG TABS: 150 | 1 days supply | Qty: 1 | Fill #0

## 2018-07-10 MED FILL — FLUCONAZOLE 150 MG TABS: 150 | 1 days supply | Qty: 1 | Fill #1

## 2018-07-10 MED FILL — TRULICITY 1.5 MG/0.5 ML PEN: 1.5 | 28 days supply | Qty: 2 | Fill #1

## 2018-07-16 MED FILL — NORETHINDRONE 0.35 MG TAB: 0.35 | 84 days supply | Qty: 84 | Fill #2

## 2018-08-04 ENCOUNTER — Encounter: Payer: Self-pay | Admitting: Family

## 2018-08-04 MED ORDER — AMLODIPINE BESYLATE 10 MG PO TABS: 10.0000 mg | ORAL_TABLET | Freq: Every day | ORAL | 5 refills | Status: DC

## 2018-08-04 MED ORDER — GLIPIZIDE 10 MG PO TABS: 10.0000 mg | ORAL_TABLET | Freq: Two times a day (BID) | ORAL | 5 refills | Status: DC

## 2018-08-04 MED FILL — AMLODIPINE BESYLATE 10 MG T: 10 | 30 days supply | Qty: 30 | Fill #0

## 2018-08-04 MED FILL — TRULICITY 1.5 MG/0.5 ML PEN: 1.5 | 28 days supply | Qty: 2 | Fill #2

## 2018-08-04 MED FILL — LISINOPRIL 40 MG TABLET: 40 | 90 days supply | Qty: 90 | Fill #1

## 2018-08-04 MED FILL — glipiZIDE 10 MG TABS: 10 | 30 days supply | Qty: 60 | Fill #0

## 2018-08-04 MED FILL — LOVASTATIN 40 MG TABS: 40 | 90 days supply | Qty: 90 | Fill #1

## 2018-09-02 ENCOUNTER — Ambulatory Visit: Payer: 59 | Admitting: Family

## 2018-09-07 ENCOUNTER — Ambulatory Visit: Payer: 59 | Admitting: Family

## 2018-09-07 DIAGNOSIS — Z6841 Body Mass Index (BMI) 40.0 and over, adult: Secondary | ICD-10-CM | POA: Diagnosis not present

## 2018-09-07 DIAGNOSIS — I10 Essential (primary) hypertension: Secondary | ICD-10-CM | POA: Diagnosis not present

## 2018-09-07 DIAGNOSIS — E7800 Pure hypercholesterolemia, unspecified: Secondary | ICD-10-CM | POA: Diagnosis not present

## 2018-09-07 DIAGNOSIS — E1165 Type 2 diabetes mellitus with hyperglycemia: Secondary | ICD-10-CM | POA: Diagnosis not present

## 2018-09-08 MED FILL — FLUCONAZOLE 150 MG TABS: 150 | 1 days supply | Qty: 1 | Fill #0

## 2018-09-08 MED FILL — AMLODIPINE BESYLATE 10 MG T: 10 | 90 days supply | Qty: 90 | Fill #1

## 2018-09-08 MED FILL — XIGDUO XR 5 MG-1,000 MG TAB: 5-1000 | 90 days supply | Qty: 180 | Fill #2

## 2018-09-08 MED FILL — TRULICITY 1.5 MG/0.5 ML PEN: 1.5 | 28 days supply | Qty: 2 | Fill #3

## 2018-09-08 MED FILL — glipiZIDE 10 MG TABS: 10 | 30 days supply | Qty: 60 | Fill #1

## 2018-09-11 ENCOUNTER — Ambulatory Visit: Payer: 59 | Admitting: Family

## 2018-09-11 ENCOUNTER — Encounter: Payer: Self-pay | Admitting: Family

## 2018-09-11 VITALS — BP 121/74 | HR 93 | Temp 98.6°F | Ht 62.0 in | Wt 304.6 lb

## 2018-09-11 DIAGNOSIS — Z6841 Body Mass Index (BMI) 40.0 and over, adult: Secondary | ICD-10-CM | POA: Diagnosis not present

## 2018-09-11 DIAGNOSIS — E1165 Type 2 diabetes mellitus with hyperglycemia: Secondary | ICD-10-CM

## 2018-09-11 DIAGNOSIS — E7800 Pure hypercholesterolemia, unspecified: Secondary | ICD-10-CM | POA: Diagnosis not present

## 2018-09-11 DIAGNOSIS — I10 Essential (primary) hypertension: Secondary | ICD-10-CM | POA: Diagnosis not present

## 2018-09-11 NOTE — Patient Instructions (Signed)
Breakfast  1 lean protein ([employer] triple zero) or fried egg or hard boiled egg, or grilled chicken +  fresh fruit (strawberries/blackberries/blueberries)  Lunch-   Grilled chicken sandwich with side salad or fruit. No bojangles   or  Sandwich with meat of choice, low fat mayo. Fresh fruit or fresh veggie  Or   Salad with grilled chicken and low fat dressing  Snack  Fresh fruit/veggies/limited nuts  Dinner  1 lean protein- Grilled chicken or fish,  Veggie- salad, grilled veggies or roasted veggies 1 small carb (1 small potato/ 1/2 cup rice/1 small roll)   Exercise goal is walking 30 minutes 5 days a week.

## 2018-09-11 NOTE — Progress Notes (Signed)
Acute Office Visit  Subjective:    Patient ID: Elizabeth [name], female    DOB: [birth year], 42 y.o.   MRN: [id number]  Chief Complaint  Patient presents with  . Follow-up    6 mos    HPI Patient is in today for follow up.  DM2- maintaind on dapagliflozin-metformin, trulicity.  Reports last a1c was 7.1 on Monday at Endo  HTN- maintained on lisinopril 28m and amlodipine 144m  BP Readings from Last 3 Encounters:  09/11/18 121/74  02/27/18 110/90  02/25/18 139/82     Hyperlipidemia- on mevacor.   Lab Results  Component Value Date   CHOL 161 01/21/2018   HDL 44.70 01/21/2018   LDLCALC 102 (H) 01/21/2018   TRIG 71.0 01/21/2018   CHOLHDL 4 01/21/2018   Reports improvement in her menstrual bleeding.   Denies any current issues with fatigue.  Started walking last week.   Wt Readings from Last 3 Encounters:  09/11/18 (!) 304 lb 9.6 oz (138.2 kg)  02/27/18 (!) 300 lb 3.2 oz (136.2 kg)  02/25/18 299 lb 9.6 oz (135.9 kg)   Breakfast- greek yogurt/fried egg/cereal/english muffin with sausage (eats M-F) does not eat on Saturday.  Lunch- burger, fried or grilled chicken sandwich with fries, (water or coke zero), chicken box from [employer] (leftovers or sandwich).  Pistachios/pretzels/chips  Dinner- heats up frozen dinners, takeout on the weekends (chinese food).  Drinks water.  No bedtime      Past Medical History:  Diagnosis Date  . Diabetes mellitus without complication ([place]  . High cholesterol   . Hypertension     Past Surgical History:  Procedure Laterality Date  . DILATION AND CURETTAGE OF UTERUS    . ENDOMETRIAL BIOPSY  12/19/2017   normal per pt  . TONSILLECTOMY    . TUBAL LIGATION      Family History  Problem Relation Age of Onset  . Diabetes Mother   . Hypertension Mother   . Ovarian cancer Maternal Grandmother   . HIV Father        died from complications ([facility])    Social History   Socioeconomic History  . Marital status: Married   Spouse name: Not on file  . Number of children: Not on file  . Years of education: Not on file  . Highest education level: Not on file  Occupational History  . Not on file  Social Needs  . Financial resource strain: Not on file  . Food insecurity:    Worry: Not on file    Inability: Not on file  . Transportation needs:    Medical: Not on file    Non-medical: Not on file  Tobacco Use  . Smoking status: Former Smoker    Years: 3.00  . Smokeless tobacco: Never Used  Substance and Sexual Activity  . Alcohol use: No  . Drug use: No  . Sexual activity: Yes    Birth control/protection: Surgical, Implant  Lifestyle  . Physical activity:    Days per week: Not on file    Minutes per session: Not on file  . Stress: Not on file  Relationships  . Social connections:    Talks on phone: Not on file    Gets together: Not on file    Attends religious service: Not on file    Active member of club or organization: Not on file    Attends meetings of clubs or organizations: Not on file    Relationship status: Not on file  .  Intimate partner violence:    Fear of current or ex partner: Not on file    Emotionally abused: Not on file    Physically abused: Not on file    Forced sexual activity: Not on file  Other Topics Concern  . 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 albuterol (PROVENTIL HFA;VENTOLIN HFA) 108 (90 Base) MCG/ACT inhaler Inhale into the lungs.    [name] amLODipine (NORVASC) 10 MG tablet Take 1 tablet (10 mg total) by mouth daily. 30 tablet 5  . Dapagliflozin-metFORMIN HCl ER 02-999 MG TB24 Take by mouth.    [name] glipiZIDE (GLUCOTROL) 10 MG tablet Take 1 tablet (10 mg total) by mouth 2 (two) times daily before a meal. 60 tablet 5  . ipratropium (ATROVENT) 0.02 % nebulizer  solution Take 2.5 mLs (0.5 mg total) by nebulization 4 (four) times daily. 75 mL 12  . lisinopril (PRINIVIL,ZESTRIL) 40 MG tablet Take 1 tablet (40 mg total) by mouth daily. 90 tablet 1  . lovastatin (MEVACOR) 40 MG tablet Take 1 tablet by mouth at bedtime.    . norethindrone (MICRONOR,CAMILA,ERRIN) 0.35 MG tablet Take 1 tablet (0.35 mg total) by mouth daily. 1 Package 11  . nystatin-triamcinolone (MYCOLOG II) cream APPLY 1 APPLICATION TOPICALLY 2 (TWO) TIMES DAILY. 30 g 0  . TRULICITY 0.09 FG/1.8EX SOPN   0  . meloxicam (MOBIC) 7.5 MG tablet   1  . nitrofurantoin, macrocrystal-monohydrate, (MACROBID) 100 MG capsule Take 1 capsule (100 mg total) by mouth 2 (two) times daily. 10 capsule 0  . albuterol (PROVENTIL HFA;VENTOLIN HFA) 108 (90 Base) MCG/ACT inhaler Inhale 2 puffs into the lungs every 6 (six) hours as needed for up to 10 days for wheezing or shortness of breath. 1 Inhaler 0  . etonogestrel (NEXPLANON) 68 MG IMPL implant 1 each (68 mg total) by Subdermal route once for 1 dose. 1 each 0  . fluticasone (FLONASE) 50 MCG/ACT nasal spray Place 2 sprays into both nostrils daily for 10 days. 16 g 0  . montelukast (SINGULAIR) 10 MG tablet Take 1 tablet (10 mg total) by mouth at bedtime. 30 tablet 1   No facility-administered medications prior to visit.     No Known Allergies  ROS     Objective:    Physical Exam  Constitutional: She is oriented to person, place, and time. She appears well-developed and well-nourished.  Cardiovascular: Normal rate, regular rhythm and normal heart sounds.  No murmur heard. Pulmonary/Chest: Effort normal and breath sounds normal. No respiratory distress. She has no wheezes.  Musculoskeletal: She exhibits no edema.  Neurological: She is alert and oriented to person, place, and time.  Skin: Skin is warm and dry.  Psychiatric: She has a normal mood and affect. Her behavior is normal. Judgment and thought content normal.    BP 121/74 (BP Location: Right  Arm, Patient Position: Sitting, Cuff Size: Large)   Pulse 93   Temp 98.6 F (37 C) (Oral)   Ht '5\' 2"'$  (1.575 m)   Wt (!) 304 lb 9.6 oz (138.2 kg)   SpO2 100%   BMI 55.71 kg/m  Wt Readings from Last 3 Encounters:  09/11/18 (!) 304 lb 9.6 oz (138.2 kg)  02/27/18 (!) 300 lb 3.2 oz (136.2 kg)  02/25/18  299 lb 9.6 oz (135.9 kg)    Health Maintenance Due  Topic Date Due  . HIV Screening  06/10/1993  . OPHTHALMOLOGY EXAM  03/21/2018  . HEMOGLOBIN A1C  07/03/2018    There are no preventive care reminders to display for this patient.   Lab Results  Component Value Date   TSH 1.67 01/21/2018   Lab Results  Component Value Date   WBC 6.4 01/21/2018   HGB 13.3 01/21/2018   HCT 42.4 01/21/2018   MCV 72.9 (L) 01/21/2018   PLT 293.0 01/21/2018   Lab Results  Component Value Date   NA 136 01/21/2018   K 4.0 01/21/2018   CO2 23 01/21/2018   GLUCOSE 172 (H) 01/21/2018   BUN 11 01/21/2018   CREATININE 0.61 01/21/2018   BILITOT 0.3 01/21/2018   ALKPHOS 78 01/21/2018   AST 14 01/21/2018   ALT 23 01/21/2018   PROT 7.1 01/21/2018   ALBUMIN 3.8 01/21/2018   CALCIUM 9.1 01/21/2018   GFR 139.98 01/21/2018   Lab Results  Component Value Date   CHOL 161 01/21/2018   Lab Results  Component Value Date   HDL 44.70 01/21/2018   Lab Results  Component Value Date   LDLCALC 102 (H) 01/21/2018   Lab Results  Component Value Date   TRIG 71.0 01/21/2018   Lab Results  Component Value Date   CHOLHDL 4 01/21/2018   Lab Results  Component Value Date   HGBA1C 7.8 12/31/2017       Assessment & Plan:   Problem List Items Addressed This Visit      Unprioritized   Uncontrolled type 2 diabetes mellitus with hyperglycemia ([place])   Pure hypercholesterolemia   Morbid obesity with BMI of 50.0-59.9, adult ([place])   Essential hypertension - Primary   Relevant Orders   Comp Met (CMET)     DM2- A1C stable, management per endo.  Hyperlipidemia- lipids stable, continue  statin.  Morbid obesity- reviewed current diet. Suggested that she alter her diet to include more lean proteins and fresh produce.  See AVS for example diet provided to patient.   A total of 30  minutes were spent face-to-face with the patient during this encounter and over half of that time was spent on counseling and coordination of care. The patient was counseled on nutrition and weight loss.   No orders of the defined types were placed in this encounter.    [name], NP

## 2018-09-16 ENCOUNTER — Telehealth: Payer: Self-pay | Admitting: Family

## 2018-09-16 NOTE — Telephone Encounter (Signed)
See mychart.  

## 2018-09-21 MED FILL — NORETHINDRONE 0.35 MG TAB: 0.35 | 84 days supply | Qty: 84 | Fill #2

## 2018-10-08 MED FILL — TRULICITY 1.5 MG/0.5 ML PEN: 1.5 | 28 days supply | Qty: 2 | Fill #4

## 2018-10-09 MED FILL — FLUCONAZOLE 150 MG TABS: 150 | 1 days supply | Qty: 1 | Fill #1

## 2018-10-26 MED FILL — glipiZIDE 10 MG TABS: 10 | 30 days supply | Qty: 60 | Fill #2

## 2018-11-02 MED FILL — TRULICITY 1.5 MG/0.5 ML PEN: 1.5 | 28 days supply | Qty: 2 | Fill #5

## 2018-11-02 MED FILL — FLUCONAZOLE 150 MG TABS: 150 | 1 days supply | Qty: 1 | Fill #0

## 2018-11-02 MED FILL — LISINOPRIL 40 MG TABLET: 40 | 90 days supply | Qty: 90 | Fill #0

## 2018-11-16 ENCOUNTER — Encounter: Payer: Self-pay | Admitting: Family

## 2018-11-23 MED FILL — glipiZIDE 10 MG TABS: 10 | 30 days supply | Qty: 60 | Fill #3

## 2018-11-23 MED FILL — FLUCONAZOLE 150 MG TABS: 150 | 1 days supply | Qty: 1 | Fill #1 | Status: TO

## 2018-12-03 MED FILL — AMLODIPINE BESYLATE 10 MG T: 10 | 60 days supply | Qty: 60 | Fill #2

## 2018-12-07 MED FILL — XIGDUO XR 5 MG-1,000 MG TAB: 5-1000 | 90 days supply | Qty: 180 | Fill #0

## 2018-12-08 MED FILL — TRULICITY 1.5 MG/0.5 ML PEN: 1.5 | 28 days supply | Qty: 2 | Fill #0

## 2018-12-09 MED FILL — NORETHINDRONE 0.35 MG TAB: 0.35 | 84 days supply | Qty: 84 | Fill #3

## 2018-12-14 DIAGNOSIS — I10 Essential (primary) hypertension: Secondary | ICD-10-CM | POA: Diagnosis not present

## 2018-12-14 DIAGNOSIS — E1165 Type 2 diabetes mellitus with hyperglycemia: Secondary | ICD-10-CM | POA: Diagnosis not present

## 2018-12-14 DIAGNOSIS — Z6841 Body Mass Index (BMI) 40.0 and over, adult: Secondary | ICD-10-CM | POA: Diagnosis not present

## 2018-12-17 ENCOUNTER — Encounter (INDEPENDENT_AMBULATORY_CARE_PROVIDER_SITE_OTHER): Payer: Self-pay

## 2018-12-22 ENCOUNTER — Ambulatory Visit (INDEPENDENT_AMBULATORY_CARE_PROVIDER_SITE_OTHER): Payer: 59 | Admitting: Family Medicine

## 2018-12-22 ENCOUNTER — Encounter (INDEPENDENT_AMBULATORY_CARE_PROVIDER_SITE_OTHER): Payer: Self-pay | Admitting: Family Medicine

## 2018-12-22 VITALS — BP 138/85 | HR 86 | Temp 98.7°F | Ht 62.0 in | Wt 298.0 lb

## 2018-12-22 DIAGNOSIS — R0602 Shortness of breath: Secondary | ICD-10-CM | POA: Diagnosis not present

## 2018-12-22 DIAGNOSIS — Z9189 Other specified personal risk factors, not elsewhere classified: Secondary | ICD-10-CM | POA: Diagnosis not present

## 2018-12-22 DIAGNOSIS — I10 Essential (primary) hypertension: Secondary | ICD-10-CM | POA: Diagnosis not present

## 2018-12-22 DIAGNOSIS — Z0289 Encounter for other administrative examinations: Secondary | ICD-10-CM

## 2018-12-22 DIAGNOSIS — Z6841 Body Mass Index (BMI) 40.0 and over, adult: Secondary | ICD-10-CM

## 2018-12-22 DIAGNOSIS — R5383 Other fatigue: Secondary | ICD-10-CM | POA: Diagnosis not present

## 2018-12-22 DIAGNOSIS — E1165 Type 2 diabetes mellitus with hyperglycemia: Secondary | ICD-10-CM

## 2018-12-22 DIAGNOSIS — Z1331 Encounter for screening for depression: Secondary | ICD-10-CM | POA: Diagnosis not present

## 2018-12-22 NOTE — Progress Notes (Signed)
Office: [phone]  /  Fax: [phone]   Dear [name], NP,   Thank you for referring [name] to our clinic. The following note includes my evaluation and treatment recommendations.  HPI:   Chief Complaint: OBESITY    Elizabeth [name] has been referred by [name], NP for consultation regarding her obesity and obesity related comorbidities.    Elizabeth [name] (MR# [id number]) is a 43 y.o. female who presents on 12/22/2018 for obesity evaluation and treatment. Current BMI is Body mass index is 54.5 kg/m.Elizabeth [name] has been struggling with her weight for many years and has been unsuccessful in either losing weight, maintaining weight loss, or reaching her healthy weight goal.     [name] attended our information session and states she is currently in the action stage of change and ready to dedicate time achieving and maintaining a healthier weight. Elizabeth [name] is interested in becoming our patient and working on intensive lifestyle modifications including (but not limited to) diet, exercise and weight loss.    Elizabeth [name] states her family eats meals together she started gaining weight after her first son was born her heaviest weight ever was 307 lbs she is a picky eater and doesn't like to eat healthier foods  she has significant food cravings issues  she snacks frequently in the evenings she skips meals frequently she is frequently drinking liquids with calories she frequently makes poor food choices she has problems with excessive hunger  she frequently eats larger portions than normal  she struggles with emotional eating    Elizabeth [name] feels her energy is lower than it should be. This has worsened with weight gain and has not worsened recently. Elizabeth [name] admits to daytime somnolence and  denies waking up still tired. Patient is at risk for obstructive sleep apnea. Patent has a history of symptoms of daytime Elizabeth. Patient generally gets 5 hours of sleep per  night, and states they generally have generally restful sleep. Snoring is present. Apneic episodes are not present. Epworth Sleepiness Score is 5.  Dyspnea on exertion Elizabeth [name] notes increasing shortness of breath with exercising and seems to be worsening over time with weight gain. She notes getting out of breath sooner with activity than she used to. This has not gotten worse recently. EKG-normal sinus rhythm at 92 BPM. Elizabeth [name] denies orthopnea.  Diabetes II with Hyperglycemia Elizabeth [name] has a diagnosis of diabetes type II. Elizabeth [name] is on glipizide, Trulicity, and Xigduo ER. She states she is not checking BGs (meter is dead). Last Hgb A1c in Epic was 7.8. She denies hypoglycemia. She has been working on intensive lifestyle modifications including diet, exercise, and weight loss to help control her blood glucose levels.  Hypertension Ever Zurita is a 43 y.o. female with hypertension. Elizabeth [name]'s blood pressure is controlled today. She denies chest pain, chest pressure, or headaches. She has had hypertension for approximately 10 years. She is working on weight loss to help control her blood pressure with the goal of decreasing her risk of heart attack and stroke.   At risk for cardiovascular disease Elizabeth [name] is at a higher than average risk for cardiovascular disease due to obesity, diabetes II, and hypertension. She currently denies any chest pain.  Depression Screen Elizabeth [name]'s Food and Mood (modified PHQ-9) score was  Depression screen PHQ 2/9 12/22/2018  Decreased Interest 2  Down, Depressed, Hopeless 1  PHQ - 2 Score 3  Altered sleeping 2  Tired, decreased energy 2  Change in appetite 1  Feeling bad or failure about yourself  1  Trouble concentrating 0  Moving slowly or fidgety/restless 1  Suicidal thoughts 0  PHQ-9 Score 10  Difficult doing work/chores Somewhat difficult    ASSESSMENT AND PLAN:  Other Elizabeth - Plan: EKG 12-Lead, Lipid panel, T3, T4, free, TSH, Vitamin B12, VITAMIN D  25 Hydroxy (Vit-D Deficiency, Fractures), Folate  Shortness of breath  Type 2 diabetes mellitus with hyperglycemia, without long-term current use of insulin (HCC) - Plan: Hemoglobin A1c, Insulin, random  Essential hypertension - Plan: CBC with Differential/Platelet, Comprehensive metabolic panel  At risk for heart disease  Screening for depression  Class 3 severe obesity with serious comorbidity and body mass index (BMI) of 50.0 to 59.9 in adult, unspecified obesity type (HCC)  PLAN:  Elizabeth Elizabeth [name] was informed that her Elizabeth may be related to obesity, depression or many other causes. Labs will be ordered, and in the meanwhile Elizabeth [name] has agreed to work on diet, exercise and weight loss to help with Elizabeth. Proper sleep hygiene was discussed including the need for 7-8 hours of quality sleep each night. A sleep study was not ordered based on symptoms and Epworth score.  Dyspnea on exertion Elizabeth [name]'s shortness of breath appears to be obesity related and exercise induced. She has agreed to work on weight loss and gradually increase exercise to treat her exercise induced shortness of breath. If Elizabeth [name] follows our instructions and loses weight without improvement of her shortness of breath, we will plan to refer to pulmonology. We will monitor this condition regularly. Elizabeth [name] agrees to this plan.  Diabetes II with Hypertension Elizabeth [name] has been given extensive diabetes education by myself today including ideal fasting and post-prandial blood glucose readings, individual ideal Hgb A1c goals and hypoglycemia prevention. We discussed the importance of good blood sugar control to decrease the likelihood of diabetic complications such as nephropathy, neuropathy, limb loss, blindness, coronary artery disease, and death. We discussed the importance of intensive lifestyle modification including diet, exercise and weight loss as the first line treatment for diabetes. Elizabeth [name] agrees to continue her  diabetes medications and we will check Hgb A1c and insulin today. Elizabeth [name] agrees to follow up with our clinic in 2 weeks.  Hypertension We discussed sodium restriction, working on healthy weight loss, and a regular exercise program as the means to achieve improved blood pressure control. Zakyah agreed with this plan and agreed to follow up as directed. We will continue to monitor her blood pressure as well as her progress with the above lifestyle modifications. Blanch will continue her medications as prescribed and will watch for signs of hypotension as she continues her lifestyle modifications. EKG was done, and we will check CMP and FLP today. Niara agrees to follow up with our clinic in 2 weeks.  Cardiovascular risk counseling Arrionna was given extended (15 minutes) coronary artery disease prevention counseling today. She is 43 y.o. female and has risk factors for heart disease including obesity, diabetes II, and hypertension. We discussed intensive lifestyle modifications today with an emphasis on specific weight loss instructions and strategies. Pt was also informed of the importance of increasing exercise and decreasing saturated fats to help prevent heart disease.  Depression Screen Deshanti had a moderately positive depression screening. Depression is commonly associated with obesity and often results in emotional eating behaviors. We will monitor this closely and work on CBT to help improve the non-hunger eating patterns. Referral to Psychology may be required if no improvement is seen as she continues in our clinic.  Obesity Lilla is currently in the  action stage of change and her goal is to continue with weight loss efforts. I recommend Marisella begin the structured treatment plan as follows:  She has agreed to follow the Category 3 plan Zollie has been instructed to eventually work up to a goal of 150 minutes of combined cardio and strengthening exercise per week for weight loss and  overall health benefits. We discussed the following Behavioral Modification Strategies today: increasing lean protein intake, increasing vegetables, decrease eating out, work on meal planning and easy cooking plans, and planning for success   She was informed of the importance of frequent follow up visits to maximize her success with intensive lifestyle modifications for her multiple health conditions. She was informed we would discuss her lab results at her next visit unless there is a critical issue that needs to be addressed sooner. Dorothyann agreed to keep her next visit at the agreed upon time to discuss these results.  ALLERGIES: No Known Allergies  MEDICATIONS: Current Outpatient Medications on File Prior to Visit  Medication Sig Dispense Refill  . albuterol (PROVENTIL HFA;VENTOLIN HFA) 108 (90 Base) MCG/ACT inhaler Inhale into the lungs.    Elizabeth Kitchen amLODipine (NORVASC) 10 MG tablet Take 1 tablet (10 mg total) by mouth daily. 30 tablet 5  . Dapagliflozin-metFORMIN HCl ER 02-999 MG TB24 Take by mouth.    . etonogestrel (NEXPLANON) 68 MG IMPL implant 1 each by Subdermal route once.    Elizabeth Kitchen glipiZIDE (GLUCOTROL) 10 MG tablet Take 1 tablet (10 mg total) by mouth 2 (two) times daily before a meal. 60 tablet 5  . ipratropium (ATROVENT) 0.02 % nebulizer solution Take 2.5 mLs (0.5 mg total) by nebulization 4 (four) times daily. 75 mL 12  . lisinopril (PRINIVIL,ZESTRIL) 40 MG tablet Take 1 tablet (40 mg total) by mouth daily. 90 tablet 1  . lovastatin (MEVACOR) 40 MG tablet Take 1 tablet by mouth at bedtime.    . meloxicam (MOBIC) 7.5 MG tablet Take 7.5 mg by mouth daily.    . norethindrone (MICRONOR,CAMILA,ERRIN) 0.35 MG tablet Take 1 tablet (0.35 mg total) by mouth daily. 1 Package 11  . omeprazole (PRILOSEC) 20 MG capsule Take 20 mg by mouth daily.    . TRULICITY 3.38 SN/0.5LZ SOPN   0   No current facility-administered medications on file prior to visit.     PAST MEDICAL HISTORY: Past Medical  History:  Diagnosis Date  . Anemia   . Arthritis of left knee   . Astigmatism   . Diabetes mellitus without complication ([place])   . Dry mouth   . Elizabeth   . GERD (gastroesophageal reflux disease)   . Heartburn   . High cholesterol   . Hypertension   . Keratoconjunctivitis   . Muscle pain   . Vitamin D deficiency   . Weakness     PAST SURGICAL HISTORY: Past Surgical History:  Procedure Laterality Date  . DILATION AND CURETTAGE OF UTERUS    . ENDOMETRIAL BIOPSY  12/19/2017   normal per pt  . TONSILLECTOMY    . TUBAL LIGATION      SOCIAL HISTORY: Social History   Tobacco Use  . Smoking status: Former Smoker    Years: 3.00  . Smokeless tobacco: Never Used  Substance Use Topics  . Alcohol use: No  . Drug use: No    FAMILY HISTORY: Family History  Problem Relation Age of Onset  . Diabetes Mother   . Hypertension Mother   . Hyperlipidemia Mother   . Obesity Mother   .  Ovarian cancer Maternal Grandmother   . HIV Father        died from complications ([facility])  . Alcohol abuse Father   . Drug abuse Father     ROS: Review of Systems  Constitutional: Positive for malaise/Elizabeth. Negative for weight loss.  HENT:       + Dry mouth  Eyes:       + Wear glasses or contacts  Respiratory: Positive for shortness of breath (with exertion).   Cardiovascular: Negative for chest pain and orthopnea.       Negative chest pressure  Gastrointestinal: Positive for heartburn.  Musculoskeletal:       + Muscle or joint pain  Neurological: Positive for weakness. Negative for headaches.  Endo/Heme/Allergies:       Negative hypoglycemia    PHYSICAL EXAM: Blood pressure 138/85, pulse 86, temperature 98.7 F (37.1 C), temperature source Oral, height 5\' 2"  (1.575 m), weight 298 lb (135.2 kg), SpO2 99 %. Body mass index is 54.5 kg/m. Physical Exam Vitals signs reviewed.  Constitutional:      Appearance: Normal appearance. She is obese.  HENT:     Head: Normocephalic and  atraumatic.     Nose: Nose normal.  Eyes:     General: No scleral icterus.    Extraocular Movements: Extraocular movements intact.  Neck:     Musculoskeletal: Normal range of motion and neck supple.     Comments: No thyromegaly present Cardiovascular:     Rate and Rhythm: Normal rate and regular rhythm.     Pulses: Normal pulses.     Heart sounds: Normal heart sounds.  Pulmonary:     Effort: Pulmonary effort is normal. No respiratory distress.     Breath sounds: Normal breath sounds.  Abdominal:     Palpations: Abdomen is soft.     Tenderness: There is no abdominal tenderness.     Comments: + Obesity  Musculoskeletal: Normal range of motion.     Right lower leg: No edema.     Left lower leg: No edema.  Skin:    General: Skin is warm and dry.     Comments: + Acanthosis nigricans  Neurological:     Mental Status: She is alert and oriented to person, place, and time.     Coordination: Coordination normal.  Psychiatric:        Mood and Affect: Mood normal.        Behavior: Behavior normal.     RECENT LABS AND TESTS: BMET    Component Value Date/Time   NA 136 01/21/2018 0825   K 4.0 01/21/2018 0825   CL 103 01/21/2018 0825   CO2 23 01/21/2018 0825   GLUCOSE 172 (H) 01/21/2018 0825   BUN 11 01/21/2018 0825   CREATININE 0.61 01/21/2018 0825   CALCIUM 9.1 01/21/2018 0825   GFRNONAA >60 06/25/2011 1240   GFRAA >60 06/25/2011 1240   Lab Results  Component Value Date   HGBA1C 7.8 12/31/2017   No results found for: INSULIN CBC    Component Value Date/Time   WBC 6.4 01/21/2018 0825   RBC 5.81 (H) 01/21/2018 0825   HGB 13.3 01/21/2018 0825   HCT 42.4 01/21/2018 0825   PLT 293.0 01/21/2018 0825   MCV 72.9 (L) 01/21/2018 0825   MCH 22.9 (L) 06/25/2011 1240   MCHC 31.5 01/21/2018 0825   RDW 15.6 (H) 01/21/2018 0825   LYMPHSABS 2.0 01/21/2018 0825   MONOABS 0.4 01/21/2018 0825   EOSABS 0.1 01/21/2018 0825  BASOSABS 0.1 01/21/2018 0825   Iron/TIBC/Ferritin/  %Sat No results found for: IRON, TIBC, FERRITIN, IRONPCTSAT Lipid Panel     Component Value Date/Time   CHOL 161 01/21/2018 0825   TRIG 71.0 01/21/2018 0825   HDL 44.70 01/21/2018 0825   CHOLHDL 4 01/21/2018 0825   VLDL 14.2 01/21/2018 0825   LDLCALC 102 (H) 01/21/2018 0825   Hepatic Function Panel     Component Value Date/Time   PROT 7.1 01/21/2018 0825   ALBUMIN 3.8 01/21/2018 0825   AST 14 01/21/2018 0825   ALT 23 01/21/2018 0825   ALKPHOS 78 01/21/2018 0825   BILITOT 0.3 01/21/2018 0825      Component Value Date/Time   TSH 1.67 01/21/2018 0825    ECG  shows NSR with a rate of 92 BPM INDIRECT CALORIMETER done today shows a VO2 of 285 and a REE of 1984.  Her calculated basal metabolic rate is 2122 thus her basal metabolic rate is better than expected.       OBESITY BEHAVIORAL INTERVENTION VISIT  Today's visit was # 1   Starting weight: 298 lbs Starting date: 12/22/2018 Today's weight : 298 lbs  Today's date: 12/22/2018 Total lbs lost to date: 0    12/22/2018  Height 5\' 2"  (1.575 m)  Weight 298 lb (135.2 kg)  BMI (Calculated) 54.49  BLOOD PRESSURE - SYSTOLIC 482  BLOOD PRESSURE - DIASTOLIC 85  Waist Measurement  58 inches   Body Fat % 55.2 %  Total Body Water (lbs) 101.8 lbs  RMR 1984     ASK: We discussed the diagnosis of obesity with [name] today and Amillion agreed to give [name] permission to discuss obesity behavioral modification therapy today.  ASSESS: Sheronica has the diagnosis of obesity and her BMI today is 54.49 Opie is in the action stage of change   ADVISE: Jullia was educated on the multiple health risks of obesity as well as the benefit of weight loss to improve her health. She was advised of the need for long term treatment and the importance of lifestyle modifications to improve her current health and to decrease her risk of future health problems.  AGREE: Multiple dietary modification options and treatment options were discussed  and  Eleftheria agreed to follow the recommendations documented in the above note.  ARRANGE: Angelea was educated on the importance of frequent visits to treat obesity as outlined per CMS and USPSTF guidelines and agreed to schedule her next follow up appointment today.  I, Trixie Dredge, am acting as transcriptionist for Ilene Qua, MD   I have reviewed the above documentation for accuracy and completeness, and I agree with the above. - Ilene Qua, MD

## 2018-12-23 LAB — COMPREHENSIVE METABOLIC PANEL
A/G RATIO: 1.7 (ref 1.2–2.2)
ALBUMIN: 4.3 g/dL (ref 3.8–4.8)
ALT: 21 IU/L (ref 0–32)
AST: 15 IU/L (ref 0–40)
Alkaline Phosphatase: 87 IU/L (ref 39–117)
BUN/Creatinine Ratio: 12 (ref 9–23)
BUN: 7 mg/dL (ref 6–24)
Bilirubin Total: 0.3 mg/dL (ref 0.0–1.2)
CALCIUM: 9.2 mg/dL (ref 8.7–10.2)
CHLORIDE: 102 mmol/L (ref 96–106)
CO2: 17 mmol/L — ABNORMAL LOW (ref 20–29)
CREATININE: 0.58 mg/dL (ref 0.57–1.00)
GFR calc Af Amer: 133 mL/min/{1.73_m2} (ref >59)
GFR calc non Af Amer: 116 mL/min/{1.73_m2} (ref >59)
Globulin, Total: 2.5 g/dL (ref 1.5–4.5)
Glucose: 133 mg/dL — ABNORMAL HIGH (ref 65–99)
Potassium: 4.4 mmol/L (ref 3.5–5.2)
Sodium: 138 mmol/L (ref 134–144)
TOTAL PROTEIN: 6.8 g/dL (ref 6.0–8.5)

## 2018-12-23 LAB — CBC WITH DIFFERENTIAL/PLATELET
BASOS: 1 %
Basophils Absolute: 0.1 10*3/uL (ref 0.0–0.2)
EOS (ABSOLUTE): 0.1 10*3/uL (ref 0.0–0.4)
EOS: 1 %
HEMATOCRIT: 43.4 % (ref 34.0–46.6)
HEMOGLOBIN: 13.3 g/dL (ref 11.1–15.9)
IMMATURE GRANULOCYTES: 0 %
Immature Grans (Abs): 0 10*3/uL (ref 0.0–0.1)
LYMPHS: 30 %
Lymphocytes Absolute: 2.1 10*3/uL (ref 0.7–3.1)
MCH: 22.2 pg — ABNORMAL LOW (ref 26.6–33.0)
MCHC: 30.6 g/dL — ABNORMAL LOW (ref 31.5–35.7)
MCV: 73 fL — AB (ref 79–97)
Monocytes Absolute: 0.3 10*3/uL (ref 0.1–0.9)
Monocytes: 5 %
NEUTROS PCT: 63 %
Neutrophils Absolute: 4.6 10*3/uL (ref 1.4–7.0)
Platelets: 301 10*3/uL (ref 150–450)
RBC: 5.99 x10E6/uL — ABNORMAL HIGH (ref 3.77–5.28)
RDW: 15.9 % — ABNORMAL HIGH (ref 11.7–15.4)
WBC: 7.1 10*3/uL (ref 3.4–10.8)

## 2018-12-23 LAB — T4, FREE: FREE T4: 1.26 ng/dL (ref 0.82–1.77)

## 2018-12-23 LAB — HEMOGLOBIN A1C
ESTIMATED AVERAGE GLUCOSE: 163 mg/dL
HEMOGLOBIN A1C: 7.3 % — AB (ref 4.8–5.6)

## 2018-12-23 LAB — INSULIN, RANDOM: INSULIN: 28.4 u[IU]/mL — ABNORMAL HIGH (ref 2.6–24.9)

## 2018-12-23 LAB — FOLATE: FOLATE: 7 ng/mL (ref >3.0)

## 2018-12-23 LAB — VITAMIN D 25 HYDROXY (VIT D DEFICIENCY, FRACTURES): Vit D, 25-Hydroxy: 19.1 ng/mL — ABNORMAL LOW (ref 30.0–100.0)

## 2018-12-23 LAB — LIPID PANEL
CHOLESTEROL TOTAL: 148 mg/dL (ref 100–199)
Chol/HDL Ratio: 3.7 ratio (ref 0.0–4.4)
HDL: 40 mg/dL (ref >39)
LDL Calculated: 96 mg/dL (ref 0–99)
TRIGLYCERIDES: 61 mg/dL (ref 0–149)
VLDL Cholesterol Cal: 12 mg/dL (ref 5–40)

## 2018-12-23 LAB — TSH: TSH: 0.837 u[IU]/mL (ref 0.450–4.500)

## 2018-12-23 LAB — T3: T3, Total: 98 ng/dL (ref 71–180)

## 2018-12-23 LAB — VITAMIN B12: VITAMIN B 12: 444 pg/mL (ref 232–1245)

## 2018-12-28 MED FILL — glipiZIDE 10 MG TABS: 10 | 30 days supply | Qty: 60 | Fill #4 | Status: TO

## 2019-01-05 ENCOUNTER — Encounter (INDEPENDENT_AMBULATORY_CARE_PROVIDER_SITE_OTHER): Payer: Self-pay | Admitting: Family Medicine

## 2019-01-05 ENCOUNTER — Ambulatory Visit (INDEPENDENT_AMBULATORY_CARE_PROVIDER_SITE_OTHER): Payer: 59 | Admitting: Family Medicine

## 2019-01-05 ENCOUNTER — Other Ambulatory Visit: Payer: Self-pay

## 2019-01-05 VITALS — BP 138/80 | HR 92 | Temp 98.1°F | Ht 62.0 in | Wt 293.0 lb

## 2019-01-05 DIAGNOSIS — E559 Vitamin D deficiency, unspecified: Secondary | ICD-10-CM

## 2019-01-05 DIAGNOSIS — Z6841 Body Mass Index (BMI) 40.0 and over, adult: Secondary | ICD-10-CM | POA: Diagnosis not present

## 2019-01-05 DIAGNOSIS — E1165 Type 2 diabetes mellitus with hyperglycemia: Secondary | ICD-10-CM | POA: Diagnosis not present

## 2019-01-05 DIAGNOSIS — I10 Essential (primary) hypertension: Secondary | ICD-10-CM | POA: Diagnosis not present

## 2019-01-05 MED ORDER — VITAMIN D (ERGOCALCIFEROL) 1.25 MG (50000 UNIT) PO CAPS: 50000.0000 [IU] | ORAL_CAPSULE | ORAL | 0 refills | Status: DC

## 2019-01-05 MED FILL — TRULICITY 1.5 MG/0.5 ML PEN: 1.5 | 28 days supply | Qty: 2 | Fill #1

## 2019-01-05 MED FILL — VIT D2 1.25 MG (50,000 UNIT: 1.25 MG | 28 days supply | Qty: 4 | Fill #0

## 2019-01-06 ENCOUNTER — Encounter (INDEPENDENT_AMBULATORY_CARE_PROVIDER_SITE_OTHER): Payer: Self-pay

## 2019-01-06 NOTE — Progress Notes (Signed)
Office: [phone]  /  Fax: [phone]   HPI:   Chief Complaint: OBESITY Elizabeth [name] is here to discuss her progress with her obesity treatment plan. She is on the Category 3 plan and is following her eating plan approximately 95 % of the time. She states she is exercising 0 minutes 0 times per week. Elizabeth [name] didn't care for the options of food on the meal plan. She notes dinner is good, but she didn't like mustard on her sandwiches, and she didn't like eggs and peanut butter for breakfast. She does not anticipate obstacles in the next 2 weeks.  Her weight is 293 lb (132.9 kg) today and has had a weight loss of 5 pounds over a period of 2 weeks since her last visit. She has lost 5 lbs since starting treatment with [name].  Diabetes II with Hyperglycemia Elizabeth [name] has a diagnosis of diabetes type II. Elizabeth [name] states fasting BGs range in 120's, Hgb A1c of 7.3, and insulin of 28.4. She is on Dapagliflozin-metformin, Trulicity, and glipizide. She has been working on intensive lifestyle modifications including diet, exercise, and weight loss to help control her blood glucose levels.  Hypertension Elizabeth [name] is a 43 y.o. female with hypertension. Elizabeth [name]'s blood pressure is controlled today. She denies chest pain, chest pressure, or headaches. She is working on weight loss to help control her blood pressure with the goal of decreasing her risk of heart attack and stroke.   Vitamin D Deficiency Elizabeth [name] has a diagnosis of vitamin D deficiency. She is not currently taking OTC Vit D. She notes fatigue and denies nausea, vomiting or muscle weakness.  At risk for osteopenia and osteoporosis Elizabeth [name] is at higher risk of osteopenia and osteoporosis due to vitamin D deficiency.   ASSESSMENT AND PLAN:  Type 2 diabetes mellitus with hyperglycemia, without long-term current use of insulin (HCC)  Essential hypertension  Vitamin D deficiency - Plan: Vitamin D, Ergocalciferol, (DRISDOL) 1.25 MG (50000 UT)  CAPS capsule  Class 3 severe obesity with serious comorbidity and body mass index (BMI) of 50.0 to 59.9 in adult, unspecified obesity type ([place])  PLAN:  Diabetes II with Hyperglycemia Elizabeth [name] has been given extensive diabetes education by myself today including ideal fasting and post-prandial blood glucose readings, individual ideal Hgb A1c goals and hypoglycemia prevention. We discussed the importance of good blood sugar control to decrease the likelihood of diabetic complications such as nephropathy, neuropathy, limb loss, blindness, coronary artery disease, and death. We discussed the importance of intensive lifestyle modification including diet, exercise and weight loss as the first line treatment for diabetes. Elizabeth [name] agrees to continue her current diabetes medications at this time, and we will retest Hgb A1c and insulin in 3 months. Elizabeth [name] agrees to follow up with our clinic in 2 weeks.  Hypertension We discussed sodium restriction, working on healthy weight loss, and a regular exercise program as the means to achieve improved blood pressure control. Elizabeth [name] agreed with this plan and agreed to follow up as directed. We will continue to monitor her blood pressure as well as her progress with the above lifestyle modifications. Elizabeth [name] agrees to continue her current medications and will watch for signs of hypotension as she continues her lifestyle modifications. Elizabeth [name] agrees to follow up with our clinic in 2 weeks.  Vitamin D Deficiency Elizabeth [name] was informed that low vitamin D levels contributes to fatigue and are associated with obesity, breast, and colon cancer. Elizabeth [name] agrees to start prescription Vit D @50 ,000 IU every week #4 with no refills.  She will follow up for routine testing of vitamin D, at least 2-3 times per year. She was informed of the risk of over-replacement of vitamin D and agrees to not increase her dose unless she discusses this with [name] first. Elizabeth [name] agrees to follow up with  our clinic in 2 weeks.  At risk for osteopenia and osteoporosis Elizabeth [name] was given extended (30 minutes) osteoporosis prevention counseling today. Elizabeth [name] is at risk for osteopenia and osteoporsis due to her vitamin D deficiency. She was encouraged to take her vitamin D and follow her higher calcium diet and increase strengthening exercise to help strengthen her bones and decrease her risk of osteopenia and osteoporosis.  Obesity Elizabeth [name] is currently in the action stage of change. As such, her goal is to continue with weight loss efforts She has agreed to follow the Category 3 plan Elizabeth [name] has been instructed to work up to a goal of 150 minutes of combined cardio and strengthening exercise per week for weight loss and overall health benefits. We discussed the following Behavioral Modification Strategies today: increasing lean protein intake, increasing vegetables, work on meal planning and easy cooking plans, and planning for success   Elizabeth [name] has agreed to follow up with our clinic in 2 weeks. She was informed of the importance of frequent follow up visits to maximize her success with intensive lifestyle modifications for her multiple health conditions.  ALLERGIES: No Known Allergies  MEDICATIONS: Current Outpatient Medications on File Prior to Visit  Medication Sig Dispense Refill  . albuterol (PROVENTIL HFA;VENTOLIN HFA) 108 (90 Base) MCG/ACT inhaler Inhale into the lungs.    [name] amLODipine (NORVASC) 10 MG tablet Take 1 tablet (10 mg total) by mouth daily. 30 tablet 5  . Dapagliflozin-metFORMIN HCl ER 02-999 MG TB24 Take by mouth.    . etonogestrel (NEXPLANON) 68 MG IMPL implant 1 each by Subdermal route once.    [name] glipiZIDE (GLUCOTROL) 10 MG tablet Take 1 tablet (10 mg total) by mouth 2 (two) times daily before a meal. 60 tablet 5  . ipratropium (ATROVENT) 0.02 % nebulizer solution Take 2.5 mLs (0.5 mg total) by nebulization 4 (four) times daily. 75 mL 12  . lisinopril (PRINIVIL,ZESTRIL)  40 MG tablet Take 1 tablet (40 mg total) by mouth daily. 90 tablet 1  . lovastatin (MEVACOR) 40 MG tablet Take 1 tablet by mouth at bedtime.    . meloxicam (MOBIC) 7.5 MG tablet Take 7.5 mg by mouth daily.    . norethindrone (MICRONOR,CAMILA,ERRIN) 0.35 MG tablet Take 1 tablet (0.35 mg total) by mouth daily. 1 Package 11  . omeprazole (PRILOSEC) 20 MG capsule Take 20 mg by mouth daily.    . TRULICITY 9.50 DT/2.6ZT SOPN   0   No current facility-administered medications on file prior to visit.     PAST MEDICAL HISTORY: Past Medical History:  Diagnosis Date  . Anemia   . Arthritis of left knee   . Astigmatism   . Diabetes mellitus without complication ([place])   . Dry mouth   . Fatigue   . GERD (gastroesophageal reflux disease)   . Heartburn   . High cholesterol   . Hypertension   . Keratoconjunctivitis   . Muscle pain   . Vitamin D deficiency   . Weakness     PAST SURGICAL HISTORY: Past Surgical History:  Procedure Laterality Date  . DILATION AND CURETTAGE OF UTERUS    . ENDOMETRIAL BIOPSY  12/19/2017   normal per pt  . TONSILLECTOMY    .  TUBAL LIGATION      SOCIAL HISTORY: Social History   Tobacco Use  . Smoking status: Former Smoker    Years: 3.00  . Smokeless tobacco: Never Used  Substance Use Topics  . Alcohol use: No  . Drug use: No    FAMILY HISTORY: Family History  Problem Relation Age of Onset  . Diabetes Mother   . Hypertension Mother   . Hyperlipidemia Mother   . Obesity Mother   . Ovarian cancer Maternal Grandmother   . HIV Father        died from complications (IVDU)  . Alcohol abuse Father   . Drug abuse Father     ROS: Review of Systems  Constitutional: Positive for malaise/fatigue and weight loss.  Cardiovascular: Negative for chest pain.       Negative chest pressure  Gastrointestinal: Negative for nausea and vomiting.  Musculoskeletal:       Negative muscle weakness  Neurological: Negative for headaches.  Endo/Heme/Allergies:        Negative hypoglycemia    PHYSICAL EXAM: Blood pressure 138/80, pulse 92, temperature 98.1 F (36.7 C), temperature source Oral, height 5\' 2"  (1.575 m), weight 293 lb (132.9 kg), SpO2 99 %. Body mass index is 53.59 kg/m. Physical Exam Vitals signs reviewed.  Constitutional:      Appearance: Normal appearance. She is obese.  Cardiovascular:     Rate and Rhythm: Normal rate.     Pulses: Normal pulses.  Pulmonary:     Effort: Pulmonary effort is normal.     Breath sounds: Normal breath sounds.  Musculoskeletal: Normal range of motion.  Skin:    General: Skin is warm and dry.  Neurological:     Mental Status: She is alert and oriented to person, place, and time.  Psychiatric:        Mood and Affect: Mood normal.        Behavior: Behavior normal.     RECENT LABS AND TESTS: BMET    Component Value Date/Time   NA 138 12/22/2018 1430   K 4.4 12/22/2018 1430   CL 102 12/22/2018 1430   CO2 17 (L) 12/22/2018 1430   GLUCOSE 133 (H) 12/22/2018 1430   GLUCOSE 172 (H) 01/21/2018 0825   BUN 7 12/22/2018 1430   CREATININE 0.58 12/22/2018 1430   CALCIUM 9.2 12/22/2018 1430   GFRNONAA 116 12/22/2018 1430   GFRAA 133 12/22/2018 1430   Lab Results  Component Value Date   HGBA1C 7.3 (H) 12/22/2018   HGBA1C 7.8 12/31/2017   Lab Results  Component Value Date   INSULIN 28.4 (H) 12/22/2018   CBC    Component Value Date/Time   WBC 7.1 12/22/2018 1430   WBC 6.4 01/21/2018 0825   RBC 5.99 (H) 12/22/2018 1430   RBC 5.81 (H) 01/21/2018 0825   HGB 13.3 12/22/2018 1430   HCT 43.4 12/22/2018 1430   PLT 301 12/22/2018 1430   MCV 73 (L) 12/22/2018 1430   MCH 22.2 (L) 12/22/2018 1430   MCH 22.9 (L) 06/25/2011 1240   MCHC 30.6 (L) 12/22/2018 1430   MCHC 31.5 01/21/2018 0825   RDW 15.9 (H) 12/22/2018 1430   LYMPHSABS 2.1 12/22/2018 1430   MONOABS 0.4 01/21/2018 0825   EOSABS 0.1 12/22/2018 1430   BASOSABS 0.1 12/22/2018 1430   Iron/TIBC/Ferritin/ %Sat No results found for: IRON,  TIBC, FERRITIN, IRONPCTSAT Lipid Panel     Component Value Date/Time   CHOL 148 12/22/2018 1430   TRIG 61 12/22/2018 1430  HDL 40 12/22/2018 1430   CHOLHDL 3.7 12/22/2018 1430   CHOLHDL 4 01/21/2018 0825   VLDL 14.2 01/21/2018 0825   LDLCALC 96 12/22/2018 1430   Hepatic Function Panel     Component Value Date/Time   PROT 6.8 12/22/2018 1430   ALBUMIN 4.3 12/22/2018 1430   AST 15 12/22/2018 1430   ALT 21 12/22/2018 1430   ALKPHOS 87 12/22/2018 1430   BILITOT 0.3 12/22/2018 1430      Component Value Date/Time   TSH 0.837 12/22/2018 1430   TSH 1.67 01/21/2018 0825      OBESITY BEHAVIORAL INTERVENTION VISIT  Today's visit was # 2   Starting weight: 298 lbs Starting date: 12/22/2018 Today's weight : 293 lbs  Today's date: 01/05/2019 Total lbs lost to date: 5    01/05/2019  Height 5\' 2"  (1.575 m)  Weight 293 lb (132.9 kg)  BMI (Calculated) 53.58  BLOOD PRESSURE - SYSTOLIC 158  BLOOD PRESSURE - DIASTOLIC 80   Body Fat % 30.9 %  Total Body Water (lbs) 101 lbs     ASK: We discussed the diagnosis of obesity with Lismary Stanislaw today and Sakai agreed to give [name] permission to discuss obesity behavioral modification therapy today.  ASSESS: Kaylor has the diagnosis of obesity and her BMI today is 53.58 Brylin is in the action stage of change   ADVISE: Rhema was educated on the multiple health risks of obesity as well as the benefit of weight loss to improve her health. She was advised of the need for long term treatment and the importance of lifestyle modifications to improve her current health and to decrease her risk of future health problems.  AGREE: Multiple dietary modification options and treatment options were discussed and  Panhia agreed to follow the recommendations documented in the above note.  ARRANGE: Devra was educated on the importance of frequent visits to treat obesity as outlined per CMS and USPSTF guidelines and agreed to schedule her next  follow up appointment today.  I, Trixie Dredge, am acting as transcriptionist for Ilene Qua, MD  I have reviewed the above documentation for accuracy and completeness, and I agree with the above. - Ilene Qua, MD

## 2019-01-11 ENCOUNTER — Encounter (INDEPENDENT_AMBULATORY_CARE_PROVIDER_SITE_OTHER): Payer: Self-pay | Admitting: Family Medicine

## 2019-01-12 ENCOUNTER — Encounter (INDEPENDENT_AMBULATORY_CARE_PROVIDER_SITE_OTHER): Payer: Self-pay

## 2019-01-14 ENCOUNTER — Encounter (INDEPENDENT_AMBULATORY_CARE_PROVIDER_SITE_OTHER): Payer: Self-pay

## 2019-01-15 ENCOUNTER — Other Ambulatory Visit: Payer: Self-pay | Admitting: Family

## 2019-01-15 MED FILL — LOVASTATIN 40 MG TABS: 40 | 30 days supply | Qty: 30 | Fill #0

## 2019-01-16 ENCOUNTER — Other Ambulatory Visit: Payer: Self-pay | Admitting: Obstetrics and Gynecology

## 2019-01-19 ENCOUNTER — Encounter (INDEPENDENT_AMBULATORY_CARE_PROVIDER_SITE_OTHER): Payer: Self-pay | Admitting: Family Medicine

## 2019-01-19 ENCOUNTER — Other Ambulatory Visit: Payer: Self-pay

## 2019-01-19 ENCOUNTER — Ambulatory Visit (INDEPENDENT_AMBULATORY_CARE_PROVIDER_SITE_OTHER): Payer: 59 | Admitting: Family Medicine

## 2019-01-19 DIAGNOSIS — Z6841 Body Mass Index (BMI) 40.0 and over, adult: Secondary | ICD-10-CM

## 2019-01-19 DIAGNOSIS — E1165 Type 2 diabetes mellitus with hyperglycemia: Secondary | ICD-10-CM | POA: Diagnosis not present

## 2019-01-19 DIAGNOSIS — E559 Vitamin D deficiency, unspecified: Secondary | ICD-10-CM

## 2019-01-20 NOTE — Progress Notes (Signed)
Office: [phone]  /  Fax: [phone] TeleHealth Visit:  Elizabeth [name] has consented to this TeleHealth visit today via telephone call on Facetime. The patient is located at home, the provider is located at the [facility] office. The participants in this visit include the listed provider and patient and provider's assistant.  HPI:   Chief Complaint: OBESITY Elizabeth [name] is here to discuss her progress with her obesity treatment plan. She is on the Category 3 plan and is following her eating plan approximately 85 % of the time. She states she is exercising 0 minutes 0 times per week. Elizabeth [name] is having difficulty finding bread and lean meat. She has made some substitutions of food when she can't find food on the meal plan.  We were unable to weigh the patient today for this TeleHealth visit. She feels as if she has lost 3 lbs since her last visit. She has lost 5-8 lbs since starting treatment with [name].  Diabetes II with Hyperglycemia Elizabeth [name] has a diagnosis of diabetes type II. Elizabeth [name] states fasting BGs range between 136 and 140, and post prandial range between 80 and 90's. She denies hypoglycemia. She is still taking glipizide and Dapagliflozin-metformin. Last A1c was 7.3. She has been working on intensive lifestyle modifications including diet, exercise, and weight loss to help control her blood glucose levels.  Vitamin D Deficiency Elizabeth [name] has a diagnosis of vitamin D deficiency. She is currently taking prescription Vit D. She notes fatigue and denies nausea, vomiting or muscle weakness.  ASSESSMENT AND PLAN:  Type 2 diabetes mellitus with hyperglycemia, without long-term current use of insulin (HCC)  Vitamin D deficiency  Class 3 severe obesity with serious comorbidity and body mass index (BMI) of 50.0 to 59.9 in adult, unspecified obesity type ([place])  PLAN:  Diabetes II with Hyperglycemia Elizabeth [name] has been given extensive diabetes education by myself today including  ideal fasting and post-prandial blood glucose readings, individual ideal Hgb A1c goals and hypoglycemia prevention. We discussed the importance of good blood sugar control to decrease the likelihood of diabetic complications such as nephropathy, neuropathy, limb loss, blindness, coronary artery disease, and death. We discussed the importance of intensive lifestyle modification including diet, exercise and weight loss as the first line treatment for diabetes. Elizabeth [name] agrees to continue her current diabetes medications, and she agrees to follow up with our clinic in 2 weeks.  Vitamin D Deficiency Elizabeth [name] was informed that low vitamin D levels contributes to fatigue and are associated with obesity, breast, and colon cancer. Elizabeth [name] agrees to continue taking prescription Vit D @50 ,000 IU every week, no refill needed. She will follow up for routine testing of vitamin D, at least 2-3 times per year. She was informed of the risk of over-replacement of vitamin D and agrees to not increase her dose unless she discusses this with [name] first. Elizabeth [name] agrees to follow up with our clinic in 2 weeks.  Obesity Elizabeth [name] is currently in the action stage of change. As such, her goal is to continue with weight loss efforts She has agreed to follow the Category 3 plan Elizabeth [name] has been instructed to work up to a goal of 150 minutes of combined cardio and strengthening exercise per week or start light physical activity for 15-30 minutes 2-3 times per week for weight loss and overall health benefits. We discussed the following Behavioral Modification Strategies today: increasing lean protein intake, increasing vegetables and work on meal planning and easy cooking plans, better snacking choices, and planning for success  Elizabeth [name] has agreed to follow up with our clinic in 2 weeks. She was informed of the importance of frequent follow up visits to maximize her success with intensive lifestyle modifications for her multiple health  conditions.  ALLERGIES: No Known Allergies  MEDICATIONS: Current Outpatient Medications on File Prior to Visit  Medication Sig Dispense Refill  . albuterol (PROVENTIL HFA;VENTOLIN HFA) 108 (90 Base) MCG/ACT inhaler Inhale into the lungs.    [name] amLODipine (NORVASC) 10 MG tablet TAKE 1 TABLET (10 MG TOTAL) BY MOUTH DAILY. 30 tablet 5  . Dapagliflozin-metFORMIN HCl ER 02-999 MG TB24 Take by mouth.    . etonogestrel (NEXPLANON) 68 MG IMPL implant 1 each by Subdermal route once.    [name] glipiZIDE (GLUCOTROL) 10 MG tablet Take 1 tablet (10 mg total) by mouth 2 (two) times daily before a meal. 60 tablet 5  . ipratropium (ATROVENT) 0.02 % nebulizer solution Take 2.5 mLs (0.5 mg total) by nebulization 4 (four) times daily. 75 mL 12  . lisinopril (PRINIVIL,ZESTRIL) 40 MG tablet Take 1 tablet (40 mg total) by mouth daily. 90 tablet 1  . lovastatin (MEVACOR) 40 MG tablet Take 1 tablet by mouth at bedtime.    . meloxicam (MOBIC) 7.5 MG tablet Take 7.5 mg by mouth daily.    . norethindrone (MICRONOR,CAMILA,ERRIN) 0.35 MG tablet Take 1 tablet (0.35 mg total) by mouth daily. 1 Package 11  . omeprazole (PRILOSEC) 20 MG capsule Take 20 mg by mouth daily.    . TRULICITY 1.66 AY/3.0ZS SOPN   0  . Vitamin D, Ergocalciferol, (DRISDOL) 1.25 MG (50000 UT) CAPS capsule Take 1 capsule (50,000 Units total) by mouth every 7 (seven) days. 4 capsule 0   No current facility-administered medications on file prior to visit.     PAST MEDICAL HISTORY: Past Medical History:  Diagnosis Date  . Anemia   . Arthritis of left knee   . Astigmatism   . Diabetes mellitus without complication ([place])   . Dry mouth   . Fatigue   . GERD (gastroesophageal reflux disease)   . Heartburn   . High cholesterol   . Hypertension   . Keratoconjunctivitis   . Muscle pain   . Vitamin D deficiency   . Weakness     PAST SURGICAL HISTORY: Past Surgical History:  Procedure Laterality Date  . DILATION AND CURETTAGE OF UTERUS    .  ENDOMETRIAL BIOPSY  12/19/2017   normal per pt  . TONSILLECTOMY    . TUBAL LIGATION      SOCIAL HISTORY: Social History   Tobacco Use  . Smoking status: Former Smoker    Years: 3.00  . Smokeless tobacco: Never Used  Substance Use Topics  . Alcohol use: No  . Drug use: No    FAMILY HISTORY: Family History  Problem Relation Age of Onset  . Diabetes Mother   . Hypertension Mother   . Hyperlipidemia Mother   . Obesity Mother   . Ovarian cancer Maternal Grandmother   . HIV Father        died from complications (IVDU)  . Alcohol abuse Father   . Drug abuse Father     ROS: Review of Systems  Constitutional: Positive for malaise/fatigue and weight loss.  Gastrointestinal: Negative for nausea and vomiting.  Musculoskeletal:       Negative muscle weakness  Endo/Heme/Allergies:       Negative hypoglycemia    PHYSICAL EXAM: Pt in no acute distress  RECENT LABS AND TESTS: BMET  Component Value Date/Time   NA 138 12/22/2018 1430   K 4.4 12/22/2018 1430   CL 102 12/22/2018 1430   CO2 17 (L) 12/22/2018 1430   GLUCOSE 133 (H) 12/22/2018 1430   GLUCOSE 172 (H) 01/21/2018 0825   BUN 7 12/22/2018 1430   CREATININE 0.58 12/22/2018 1430   CALCIUM 9.2 12/22/2018 1430   GFRNONAA 116 12/22/2018 1430   GFRAA 133 12/22/2018 1430   Lab Results  Component Value Date   HGBA1C 7.3 (H) 12/22/2018   HGBA1C 7.8 12/31/2017   Lab Results  Component Value Date   INSULIN 28.4 (H) 12/22/2018   CBC    Component Value Date/Time   WBC 7.1 12/22/2018 1430   WBC 6.4 01/21/2018 0825   RBC 5.99 (H) 12/22/2018 1430   RBC 5.81 (H) 01/21/2018 0825   HGB 13.3 12/22/2018 1430   HCT 43.4 12/22/2018 1430   PLT 301 12/22/2018 1430   MCV 73 (L) 12/22/2018 1430   MCH 22.2 (L) 12/22/2018 1430   MCH 22.9 (L) 06/25/2011 1240   MCHC 30.6 (L) 12/22/2018 1430   MCHC 31.5 01/21/2018 0825   RDW 15.9 (H) 12/22/2018 1430   LYMPHSABS 2.1 12/22/2018 1430   MONOABS 0.4 01/21/2018 0825   EOSABS  0.1 12/22/2018 1430   BASOSABS 0.1 12/22/2018 1430   Iron/TIBC/Ferritin/ %Sat No results found for: IRON, TIBC, FERRITIN, IRONPCTSAT Lipid Panel     Component Value Date/Time   CHOL 148 12/22/2018 1430   TRIG 61 12/22/2018 1430   HDL 40 12/22/2018 1430   CHOLHDL 3.7 12/22/2018 1430   CHOLHDL 4 01/21/2018 0825   VLDL 14.2 01/21/2018 0825   LDLCALC 96 12/22/2018 1430   Hepatic Function Panel     Component Value Date/Time   PROT 6.8 12/22/2018 1430   ALBUMIN 4.3 12/22/2018 1430   AST 15 12/22/2018 1430   ALT 21 12/22/2018 1430   ALKPHOS 87 12/22/2018 1430   BILITOT 0.3 12/22/2018 1430      Component Value Date/Time   TSH 0.837 12/22/2018 1430   TSH 1.67 01/21/2018 0825      I, Trixie Dredge, am acting as transcriptionist for Ilene Qua, MD  I have reviewed the above documentation for accuracy and completeness, and I agree with the above. - Ilene Qua, MD

## 2019-01-21 ENCOUNTER — Ambulatory Visit (INDEPENDENT_AMBULATORY_CARE_PROVIDER_SITE_OTHER): Payer: 59 | Admitting: Family Medicine

## 2019-01-21 MED FILL — glipiZIDE 10 MG TABS: 10 | 30 days supply | Qty: 60 | Fill #0

## 2019-01-27 ENCOUNTER — Other Ambulatory Visit (INDEPENDENT_AMBULATORY_CARE_PROVIDER_SITE_OTHER): Payer: Self-pay | Admitting: Family Medicine

## 2019-01-27 DIAGNOSIS — E559 Vitamin D deficiency, unspecified: Secondary | ICD-10-CM

## 2019-01-27 MED FILL — FLUCONAZOLE 150 MG TABS: 150 | 1 days supply | Qty: 1 | Fill #0

## 2019-02-02 ENCOUNTER — Other Ambulatory Visit: Payer: Self-pay

## 2019-02-02 ENCOUNTER — Ambulatory Visit (INDEPENDENT_AMBULATORY_CARE_PROVIDER_SITE_OTHER): Payer: 59 | Admitting: Family Medicine

## 2019-02-02 ENCOUNTER — Encounter (INDEPENDENT_AMBULATORY_CARE_PROVIDER_SITE_OTHER): Payer: Self-pay | Admitting: Family Medicine

## 2019-02-02 DIAGNOSIS — E559 Vitamin D deficiency, unspecified: Secondary | ICD-10-CM | POA: Diagnosis not present

## 2019-02-02 DIAGNOSIS — E1165 Type 2 diabetes mellitus with hyperglycemia: Secondary | ICD-10-CM

## 2019-02-02 DIAGNOSIS — Z6841 Body Mass Index (BMI) 40.0 and over, adult: Secondary | ICD-10-CM

## 2019-02-02 MED ORDER — VITAMIN D (ERGOCALCIFEROL) 1.25 MG (50000 UNIT) PO CAPS: 50000.0000 [IU] | ORAL_CAPSULE | ORAL | 0 refills | Status: DC

## 2019-02-08 NOTE — Progress Notes (Signed)
Office: [phone]  /  Fax: [phone] TeleHealth Visit:  Elizabeth [name] has verbally consented to this TeleHealth visit today. The patient is located at home, the provider is located at the [facility] office. The participants in this visit include the listed provider and patient. The visit was conducted today via webex.  HPI:   Chief Complaint: OBESITY Elizabeth [name] is here to discuss her progress with her obesity treatment plan. She is on the Category 3 plan and is following her eating plan approximately 95 % of the time. She states she is walking for 10 minutes 3 times per week. Elizabeth [name] has been walking at work during her break. She has been very disciplined with her eating except for Easter. She notes no obstacles currently with following the plan. She is able to find food on the plan, and is not doing much for snacks.  We were unable to weigh the patient today for this TeleHealth visit. She feels as if she has lost 3 lbs since her last visit. She has lost 5-8 lbs since starting treatment with [name].  Diabetes II with Hyperglycemia Elizabeth [name] has a diagnosis of diabetes type II. Elizabeth [name] denies feelings of hypoglycemia. She states she is not checking BGs secondary to forgetting. She denies side effects of medications. Last A1c was 7.3. She has been working on intensive lifestyle modifications including diet, exercise, and weight loss to help control her blood glucose levels.  Vitamin D Deficiency Elizabeth [name] has a diagnosis of vitamin D deficiency. She is currently taking prescription Vit D. She notes fatigue and denies nausea, vomiting or muscle weakness.  ASSESSMENT AND PLAN:  Vitamin D deficiency - Plan: Vitamin D, Ergocalciferol, (DRISDOL) 1.25 MG (50000 UT) CAPS capsule  Type 2 diabetes mellitus with hyperglycemia, without long-term current use of insulin (HCC)  Class 3 severe obesity with serious comorbidity and body mass index (BMI) of 50.0 to 59.9 in adult, unspecified  obesity type ([place])  PLAN:  Diabetes II with Hyperglycemia Elizabeth [name] has been given extensive diabetes education by myself today including ideal fasting and post-prandial blood glucose readings, individual ideal Hgb A1c goals and hypoglycemia prevention. We discussed the importance of good blood sugar control to decrease the likelihood of diabetic complications such as nephropathy, neuropathy, limb loss, blindness, coronary artery disease, and death. We discussed the importance of intensive lifestyle modification including diet, exercise and weight loss as the first line treatment for diabetes. Elizabeth [name] agrees to continue her diabetes medications, and she is to check BGs at least 1 time per day with hopes to decrease glipizide. Elizabeth [name] agrees to follow up with our clinic in 2 weeks.  Vitamin D Deficiency Elizabeth [name] was informed that low vitamin D levels contributes to fatigue and are associated with obesity, breast, and colon cancer. Elizabeth [name] agrees to continue taking prescription Vit D @50 ,000 IU every week #4 and we will refill for 1 month. She will follow up for routine testing of vitamin D, at least 2-3 times per year. She was informed of the risk of over-replacement of vitamin D and agrees to not increase her dose unless she discusses this with [name] first. Elizabeth [name] agrees to follow up with our clinic in 2 weeks.  Obesity Elizabeth [name] is currently in the action stage of change. As such, her goal is to continue with weight loss efforts She has agreed to follow the Category 3 plan Elizabeth [name] has been instructed to work up to a goal of 150 minutes of combined cardio and strengthening exercise per week or continue  walking 3 times per week for up to 10 minutes for weight loss and overall health benefits. We discussed the following Behavioral Modification Strategies today: increasing lean protein intake, increasing vegetables, work on meal planning and easy cooking plans, emotional eating strategies, avoiding temptations,  better snacking choices, and planning for success   Elizabeth [name] has agreed to follow up with our clinic in 2 weeks. She was informed of the importance of frequent follow up visits to maximize her success with intensive lifestyle modifications for her multiple health conditions.  ALLERGIES: No Known Allergies  MEDICATIONS: Current Outpatient Medications on File Prior to Visit  Medication Sig Dispense Refill  . albuterol (PROVENTIL HFA;VENTOLIN HFA) 108 (90 Base) MCG/ACT inhaler Inhale into the lungs.    [name] amLODipine (NORVASC) 10 MG tablet TAKE 1 TABLET (10 MG TOTAL) BY MOUTH DAILY. 30 tablet 5  . Dapagliflozin-metFORMIN HCl ER 02-999 MG TB24 Take by mouth.    . etonogestrel (NEXPLANON) 68 MG IMPL implant 1 each by Subdermal route once.    [name] glipiZIDE (GLUCOTROL) 10 MG tablet Take 1 tablet (10 mg total) by mouth 2 (two) times daily before a meal. 60 tablet 5  . ipratropium (ATROVENT) 0.02 % nebulizer solution Take 2.5 mLs (0.5 mg total) by nebulization 4 (four) times daily. 75 mL 12  . lisinopril (PRINIVIL,ZESTRIL) 40 MG tablet Take 1 tablet (40 mg total) by mouth daily. 90 tablet 1  . lovastatin (MEVACOR) 40 MG tablet Take 1 tablet by mouth at bedtime.    . meloxicam (MOBIC) 7.5 MG tablet Take 7.5 mg by mouth daily.    . norethindrone (MICRONOR,CAMILA,ERRIN) 0.35 MG tablet Take 1 tablet (0.35 mg total) by mouth daily. 1 Package 11  . omeprazole (PRILOSEC) 20 MG capsule Take 20 mg by mouth daily.    . TRULICITY 7.34 KA/7.6OT SOPN   0   No current facility-administered medications on file prior to visit.     PAST MEDICAL HISTORY: Past Medical History:  Diagnosis Date  . Anemia   . Arthritis of left knee   . Astigmatism   . Diabetes mellitus without complication ([place])   . Dry mouth   . Fatigue   . GERD (gastroesophageal reflux disease)   . Heartburn   . High cholesterol   . Hypertension   . Keratoconjunctivitis   . Muscle pain   . Vitamin D deficiency   . Weakness     PAST  SURGICAL HISTORY: Past Surgical History:  Procedure Laterality Date  . DILATION AND CURETTAGE OF UTERUS    . ENDOMETRIAL BIOPSY  12/19/2017   normal per pt  . TONSILLECTOMY    . TUBAL LIGATION      SOCIAL HISTORY: Social History   Tobacco Use  . Smoking status: Former Smoker    Years: 3.00  . Smokeless tobacco: Never Used  Substance Use Topics  . Alcohol use: No  . Drug use: No    FAMILY HISTORY: Family History  Problem Relation Age of Onset  . Diabetes Mother   . Hypertension Mother   . Hyperlipidemia Mother   . Obesity Mother   . Ovarian cancer Maternal Grandmother   . HIV Father        died from complications (IVDU)  . Alcohol abuse Father   . Drug abuse Father     ROS: Review of Systems  Constitutional: Positive for malaise/fatigue and weight loss.  Gastrointestinal: Negative for nausea and vomiting.  Musculoskeletal:       Negative muscle weakness  Endo/Heme/Allergies:  Negative hypoglycemia    PHYSICAL EXAM: Pt in no acute distress  RECENT LABS AND TESTS: BMET    Component Value Date/Time   NA 138 12/22/2018 1430   K 4.4 12/22/2018 1430   CL 102 12/22/2018 1430   CO2 17 (L) 12/22/2018 1430   GLUCOSE 133 (H) 12/22/2018 1430   GLUCOSE 172 (H) 01/21/2018 0825   BUN 7 12/22/2018 1430   CREATININE 0.58 12/22/2018 1430   CALCIUM 9.2 12/22/2018 1430   GFRNONAA 116 12/22/2018 1430   GFRAA 133 12/22/2018 1430   Lab Results  Component Value Date   HGBA1C 7.3 (H) 12/22/2018   HGBA1C 7.8 12/31/2017   Lab Results  Component Value Date   INSULIN 28.4 (H) 12/22/2018   CBC    Component Value Date/Time   WBC 7.1 12/22/2018 1430   WBC 6.4 01/21/2018 0825   RBC 5.99 (H) 12/22/2018 1430   RBC 5.81 (H) 01/21/2018 0825   HGB 13.3 12/22/2018 1430   HCT 43.4 12/22/2018 1430   PLT 301 12/22/2018 1430   MCV 73 (L) 12/22/2018 1430   MCH 22.2 (L) 12/22/2018 1430   MCH 22.9 (L) 06/25/2011 1240   MCHC 30.6 (L) 12/22/2018 1430   MCHC 31.5 01/21/2018  0825   RDW 15.9 (H) 12/22/2018 1430   LYMPHSABS 2.1 12/22/2018 1430   MONOABS 0.4 01/21/2018 0825   EOSABS 0.1 12/22/2018 1430   BASOSABS 0.1 12/22/2018 1430   Iron/TIBC/Ferritin/ %Sat No results found for: IRON, TIBC, FERRITIN, IRONPCTSAT Lipid Panel     Component Value Date/Time   CHOL 148 12/22/2018 1430   TRIG 61 12/22/2018 1430   HDL 40 12/22/2018 1430   CHOLHDL 3.7 12/22/2018 1430   CHOLHDL 4 01/21/2018 0825   VLDL 14.2 01/21/2018 0825   LDLCALC 96 12/22/2018 1430   Hepatic Function Panel     Component Value Date/Time   PROT 6.8 12/22/2018 1430   ALBUMIN 4.3 12/22/2018 1430   AST 15 12/22/2018 1430   ALT 21 12/22/2018 1430   ALKPHOS 87 12/22/2018 1430   BILITOT 0.3 12/22/2018 1430      Component Value Date/Time   TSH 0.837 12/22/2018 1430   TSH 1.67 01/21/2018 0825      I, Trixie Dredge, am acting as transcriptionist for Ilene Qua, MD   I have reviewed the above documentation for accuracy and completeness, and I agree with the above. - Ilene Qua, MD

## 2019-02-09 MED FILL — NORETHINDRONE 0.35 MG TAB: 0.35 | 28 days supply | Qty: 28 | Fill #0

## 2019-02-11 MED FILL — TRULICITY 1.5 MG/0.5 ML PEN: 1.5 | 28 days supply | Qty: 2 | Fill #2 | Status: TO

## 2019-02-12 MED FILL — AMLODIPINE BESYLATE 10 MG T: 10 | 30 days supply | Qty: 30 | Fill #0

## 2019-02-17 ENCOUNTER — Other Ambulatory Visit: Payer: Self-pay

## 2019-02-17 ENCOUNTER — Encounter: Payer: Self-pay | Admitting: Family

## 2019-02-17 ENCOUNTER — Ambulatory Visit (INDEPENDENT_AMBULATORY_CARE_PROVIDER_SITE_OTHER): Payer: 59 | Admitting: Family Medicine

## 2019-02-17 DIAGNOSIS — E1165 Type 2 diabetes mellitus with hyperglycemia: Secondary | ICD-10-CM | POA: Diagnosis not present

## 2019-02-17 DIAGNOSIS — I10 Essential (primary) hypertension: Secondary | ICD-10-CM | POA: Diagnosis not present

## 2019-02-17 DIAGNOSIS — Z6841 Body Mass Index (BMI) 40.0 and over, adult: Secondary | ICD-10-CM

## 2019-02-17 MED ORDER — LISINOPRIL 40 MG PO TABS: 40.0000 mg | ORAL_TABLET | Freq: Every day | ORAL | 1 refills | Status: DC

## 2019-02-17 MED FILL — LISINOPRIL 40 MG TABLET: 40 | 90 days supply | Qty: 90 | Fill #0

## 2019-02-18 MED FILL — VIT D2 1.25 MG (50,000 UNIT: 1.25 MG | 28 days supply | Qty: 4 | Fill #0

## 2019-02-18 NOTE — Progress Notes (Signed)
Office: [phone]  /  Fax: [phone] TeleHealth Visit:  Elizabeth [name] has verbally consented to this TeleHealth visit today. The patient is located at home, the provider is located at the [facility] office. The participants in this visit include the listed provider and patient. The visit was conducted today via webex.  HPI:   Chief Complaint: OBESITY Elizabeth [name] is here to discuss her progress with her obesity treatment plan. She is on the Category 3 plan and is following her eating plan approximately 75 % of the time. She states she is walking for 10 minutes 3 times per week. Elizabeth [name] stayed off the meal plan the last two weekends. She ate pizza, fried fish, pot pie, and fried chicken. She was eating with family and didn't have what was needed for meals. This weekend she is planning in preparation to have food available. We were unable to weigh the patient today for this TeleHealth visit. She feels as if she has gained 3 pounds since her last visit. She has lost 5 lbs since starting treatment with [name].  Diabetes II with Hyperglycemia Elizabeth [name] has a diagnosis of diabetes type II. Zamaya denies any feelings of hypoglycemia. She denies GI side effects of metformin or Trulicity. Last A1c was 7.3. She has been working on intensive lifestyle modifications including diet, exercise, and weight loss to help control her blood glucose levels.  Hypertension Elizabeth [name] is a 43 y.o. female with hypertension. Elizabeth [name]'s blood pressure was controlled previously. She denies chest pain, chest pressure, or headaches. She is working on weight loss to help control her blood pressure with the goal of decreasing her risk of heart attack and stroke.  ASSESSMENT AND PLAN:  Type 2 diabetes mellitus with hyperglycemia, without long-term current use of insulin (HCC)  Essential hypertension  Class 3 severe obesity with serious comorbidity and body mass index (BMI) of 50.0 to 59.9 in adult,  unspecified obesity type ([place])  PLAN:  Diabetes II with Hyperglycemia Elizabeth [name] has been given extensive diabetes education by myself today including ideal fasting and post-prandial blood glucose readings, individual ideal Hgb A1c goals and hypoglycemia prevention. We discussed the importance of good blood sugar control to decrease the likelihood of diabetic complications such as nephropathy, neuropathy, limb loss, blindness, coronary artery disease, and death. We discussed the importance of intensive lifestyle modification including diet, exercise and weight loss as the first line treatment for diabetes. [name] agrees to continue her current diabetes medications at current doses. Elizabeth [name] agrees to follow up with our clinic in 2 weeks.  Hypertension We discussed sodium restriction, working on healthy weight loss, and a regular exercise program as the means to achieve improved blood pressure control. Elizabeth [name] agreed with this plan and agreed to follow up as directed. We will continue to monitor her blood pressure as well as her progress with the above lifestyle modifications. Elizabeth [name] agrees to continue her current medications and will watch for signs of hypotension as she continues her lifestyle modifications. Elizabeth [name] agrees to follow up with our clinic in 2 weeks.  Obesity Elizabeth [name] is currently in the action stage of change. As such, her goal is to continue with weight loss efforts She has agreed to follow the Category 3 plan Elizabeth [name] has been instructed to work up to a goal of 150 minutes of combined cardio and strengthening exercise per week or continue walking for 10 minutes 3 times per week for weight loss and overall health benefits. We discussed the following Behavioral Modification Strategies today: increasing  lean protein intake, increasing vegetables, work on meal planning and easy cooking plans, dealing with family or coworker sabotage, holiday eating strategies, and planning for success     Elizabeth [name] has agreed to follow up with our clinic in 2 weeks. She was informed of the importance of frequent follow up visits to maximize her success with intensive lifestyle modifications for her multiple health conditions.  ALLERGIES: No Known Allergies  MEDICATIONS: Current Outpatient Medications on File Prior to Visit  Medication Sig Dispense Refill  . albuterol (PROVENTIL HFA;VENTOLIN HFA) 108 (90 Base) MCG/ACT inhaler Inhale into the lungs.    [name] amLODipine (NORVASC) 10 MG tablet TAKE 1 TABLET (10 MG TOTAL) BY MOUTH DAILY. 30 tablet 5  . Dapagliflozin-metFORMIN HCl ER 02-999 MG TB24 Take by mouth.    . etonogestrel (NEXPLANON) 68 MG IMPL implant 1 each by Subdermal route once.    [name] glipiZIDE (GLUCOTROL) 10 MG tablet Take 1 tablet (10 mg total) by mouth 2 (two) times daily before a meal. 60 tablet 5  . ipratropium (ATROVENT) 0.02 % nebulizer solution Take 2.5 mLs (0.5 mg total) by nebulization 4 (four) times daily. 75 mL 12  . lovastatin (MEVACOR) 40 MG tablet Take 1 tablet by mouth at bedtime.    . meloxicam (MOBIC) 7.5 MG tablet Take 7.5 mg by mouth daily.    . norethindrone (MICRONOR) 0.35 MG tablet TAKE 1 TABLET (0.35 MG TOTAL) BY MOUTH DAILY. 28 tablet 11  . omeprazole (PRILOSEC) 20 MG capsule Take 20 mg by mouth daily.    . TRULICITY 3.22 GU/5.4YH SOPN   0  . Vitamin D, Ergocalciferol, (DRISDOL) 1.25 MG (50000 UT) CAPS capsule Take 1 capsule (50,000 Units total) by mouth every 7 (seven) days. 4 capsule 0   No current facility-administered medications on file prior to visit.     PAST MEDICAL HISTORY: Past Medical History:  Diagnosis Date  . Anemia   . Arthritis of left knee   . Astigmatism   . Diabetes mellitus without complication ([place])   . Dry mouth   . Fatigue   . GERD (gastroesophageal reflux disease)   . Heartburn   . High cholesterol   . Hypertension   . Keratoconjunctivitis   . Muscle pain   . Vitamin D deficiency   . Weakness     PAST SURGICAL HISTORY: Past  Surgical History:  Procedure Laterality Date  . DILATION AND CURETTAGE OF UTERUS    . ENDOMETRIAL BIOPSY  12/19/2017   normal per pt  . TONSILLECTOMY    . TUBAL LIGATION      SOCIAL HISTORY: Social History   Tobacco Use  . Smoking status: Former Smoker    Years: 3.00  . Smokeless tobacco: Never Used  Substance Use Topics  . Alcohol use: No  . Drug use: No    FAMILY HISTORY: Family History  Problem Relation Age of Onset  . Diabetes Mother   . Hypertension Mother   . Hyperlipidemia Mother   . Obesity Mother   . Ovarian cancer Maternal Grandmother   . HIV Father        died from complications (IVDU)  . Alcohol abuse Father   . Drug abuse Father     ROS: Review of Systems  Constitutional: Negative for weight loss.  Cardiovascular: Negative for chest pain.       Negative chest pressure  Neurological: Negative for headaches.  Endo/Heme/Allergies:       Negative hypoglycemia    PHYSICAL EXAM: Pt in no  acute distress  RECENT LABS AND TESTS: BMET    Component Value Date/Time   NA 138 12/22/2018 1430   K 4.4 12/22/2018 1430   CL 102 12/22/2018 1430   CO2 17 (L) 12/22/2018 1430   GLUCOSE 133 (H) 12/22/2018 1430   GLUCOSE 172 (H) 01/21/2018 0825   BUN 7 12/22/2018 1430   CREATININE 0.58 12/22/2018 1430   CALCIUM 9.2 12/22/2018 1430   GFRNONAA 116 12/22/2018 1430   GFRAA 133 12/22/2018 1430   Lab Results  Component Value Date   HGBA1C 7.3 (H) 12/22/2018   HGBA1C 7.8 12/31/2017   Lab Results  Component Value Date   INSULIN 28.4 (H) 12/22/2018   CBC    Component Value Date/Time   WBC 7.1 12/22/2018 1430   WBC 6.4 01/21/2018 0825   RBC 5.99 (H) 12/22/2018 1430   RBC 5.81 (H) 01/21/2018 0825   HGB 13.3 12/22/2018 1430   HCT 43.4 12/22/2018 1430   PLT 301 12/22/2018 1430   MCV 73 (L) 12/22/2018 1430   MCH 22.2 (L) 12/22/2018 1430   MCH 22.9 (L) 06/25/2011 1240   MCHC 30.6 (L) 12/22/2018 1430   MCHC 31.5 01/21/2018 0825   RDW 15.9 (H) 12/22/2018  1430   LYMPHSABS 2.1 12/22/2018 1430   MONOABS 0.4 01/21/2018 0825   EOSABS 0.1 12/22/2018 1430   BASOSABS 0.1 12/22/2018 1430   Iron/TIBC/Ferritin/ %Sat No results found for: IRON, TIBC, FERRITIN, IRONPCTSAT Lipid Panel     Component Value Date/Time   CHOL 148 12/22/2018 1430   TRIG 61 12/22/2018 1430   HDL 40 12/22/2018 1430   CHOLHDL 3.7 12/22/2018 1430   CHOLHDL 4 01/21/2018 0825   VLDL 14.2 01/21/2018 0825   LDLCALC 96 12/22/2018 1430   Hepatic Function Panel     Component Value Date/Time   PROT 6.8 12/22/2018 1430   ALBUMIN 4.3 12/22/2018 1430   AST 15 12/22/2018 1430   ALT 21 12/22/2018 1430   ALKPHOS 87 12/22/2018 1430   BILITOT 0.3 12/22/2018 1430      Component Value Date/Time   TSH 0.837 12/22/2018 1430   TSH 1.67 01/21/2018 0825      I, Trixie Dredge, am acting as transcriptionist for Ilene Qua, MD  I have reviewed the above documentation for accuracy and completeness, and I agree with the above. - Ilene Qua, MD

## 2019-03-03 ENCOUNTER — Encounter (INDEPENDENT_AMBULATORY_CARE_PROVIDER_SITE_OTHER): Payer: Self-pay | Admitting: Family Medicine

## 2019-03-03 ENCOUNTER — Ambulatory Visit (INDEPENDENT_AMBULATORY_CARE_PROVIDER_SITE_OTHER): Payer: 59 | Admitting: Family Medicine

## 2019-03-03 ENCOUNTER — Other Ambulatory Visit: Payer: Self-pay | Admitting: Family

## 2019-03-03 ENCOUNTER — Other Ambulatory Visit: Payer: Self-pay

## 2019-03-03 DIAGNOSIS — E1165 Type 2 diabetes mellitus with hyperglycemia: Secondary | ICD-10-CM

## 2019-03-03 DIAGNOSIS — E559 Vitamin D deficiency, unspecified: Secondary | ICD-10-CM | POA: Diagnosis not present

## 2019-03-03 DIAGNOSIS — Z6841 Body Mass Index (BMI) 40.0 and over, adult: Secondary | ICD-10-CM

## 2019-03-03 MED FILL — glipiZIDE 10 MG TABS: 10 | 30 days supply | Qty: 60 | Fill #0

## 2019-03-03 NOTE — Progress Notes (Signed)
Office: [phone]  /  Fax: [phone] TeleHealth Visit:  Elizabeth [name] has verbally consented to this TeleHealth visit today. The patient is located at home, the provider is located at the [facility] office. The participants in this visit include the listed provider and patient. The visit was conducted today via webex.  HPI:   Chief Complaint: OBESITY Elizabeth [name] is here to discuss her progress with her obesity treatment plan. She is on the Category 3 plan and is following her eating plan approximately 70 % of the time. She states she is walking for 10 minutes 3 times per week. Elizabeth [name] is really struggling to eat any protein in form of lean meat, secondary to significant tooth pain from a root canal that went wrong. She is walking some. She is able to do baked chicken and fish for dinner. Her weight is of 294 lbs. She is doing microwave meals for lunch.  We were unable to weigh the patient today for this TeleHealth visit. She feels as if she has maintained her weight since her last visit. She has lost 5 lbs since starting treatment with [name].  Diabetes II with Hyperglycemia Elizabeth [name] has a diagnosis of diabetes type II. Elizabeth [name] states her fasting BGs is 130 in the morning and 120 in the evening. She states her lowest BGs was 109. She denies hypoglycemia. Last A1c was 7.3. She has been working on intensive lifestyle modifications including diet, exercise, and weight loss to help control her blood glucose levels.  Vitamin D Deficiency Elizabeth [name] has a diagnosis of vitamin D deficiency. She is currently taking prescription Vit D. She notes fatigue and denies nausea, vomiting or muscle weakness.  ASSESSMENT AND PLAN:  Type 2 diabetes mellitus with hyperglycemia, without long-term current use of insulin (HCC)  Vitamin D deficiency  Class 3 severe obesity with serious comorbidity and body mass index (BMI) of 50.0 to 59.9 in adult, unspecified obesity type (Elizabeth [name])  PLAN:  Diabetes  II with Hyperglycemia Elizabeth [name] has been given extensive diabetes education by myself today including ideal fasting and post-prandial blood glucose readings, individual ideal Hgb A1c goals and hypoglycemia prevention. We discussed the importance of good blood sugar control to decrease the likelihood of diabetic complications such as nephropathy, neuropathy, limb loss, blindness, coronary artery disease, and death. We discussed the importance of intensive lifestyle modification including diet, exercise and weight loss as the first line treatment for diabetes. Elizabeth [name] agrees to continue her current diabetes medications, no change in dose. Elizabeth [name] agrees to follow up with our clinic in 2 weeks.  Vitamin D Deficiency Elizabeth [name] was informed that low vitamin D levels contributes to fatigue and are associated with obesity, breast, and colon cancer. Elizabeth [name] agrees to continue taking prescription Vit D @50 ,000 IU every week, no refill needed. She will follow up for routine testing of vitamin D, at least 2-3 times per year. She was informed of the risk of over-replacement of vitamin D and agrees to not increase her dose unless she discusses this with [name] first. Elizabeth [name] agrees to follow up with our clinic in 2 weeks.  Obesity Elizabeth [name] is currently in the action stage of change. As such, her goal is to continue with weight loss efforts She has agreed to keep a food journal with 450-600 calories and 40+ grams of protein at supper daily and follow the Category 3 plan Elizabeth [name] has been instructed to work up to a goal of 150 minutes of combined cardio and strengthening exercise per week for weight loss  and overall health benefits. We discussed the following Behavioral Modification Strategies today: increasing lean protein intake, increasing vegetables and work on meal planning and easy cooking plans, keeping healthy foods in the home, and planning for success   Elizabeth [name] has agreed to follow up with our clinic in 2 weeks. She  was informed of the importance of frequent follow up visits to maximize her success with intensive lifestyle modifications for her multiple health conditions.  ALLERGIES: No Known Allergies  MEDICATIONS: Current Outpatient Medications on File Prior to Visit  Medication Sig Dispense Refill  . albuterol (PROVENTIL HFA;VENTOLIN HFA) 108 (90 Base) MCG/ACT inhaler Inhale into the lungs.    [name] amLODipine (NORVASC) 10 MG tablet TAKE 1 TABLET (10 MG TOTAL) BY MOUTH DAILY. 30 tablet 5  . Dapagliflozin-metFORMIN HCl ER 02-999 MG TB24 Take by mouth.    . etonogestrel (NEXPLANON) 68 MG IMPL implant 1 each by Subdermal route once.    [name] ipratropium (ATROVENT) 0.02 % nebulizer solution Take 2.5 mLs (0.5 mg total) by nebulization 4 (four) times daily. 75 mL 12  . lisinopril (ZESTRIL) 40 MG tablet Take 1 tablet (40 mg total) by mouth daily. 90 tablet 1  . lovastatin (MEVACOR) 40 MG tablet Take 1 tablet by mouth at bedtime.    . meloxicam (MOBIC) 7.5 MG tablet Take 7.5 mg by mouth daily.    . norethindrone (MICRONOR) 0.35 MG tablet TAKE 1 TABLET (0.35 MG TOTAL) BY MOUTH DAILY. 28 tablet 11  . omeprazole (PRILOSEC) 20 MG capsule Take 20 mg by mouth daily.    . TRULICITY 2.11 HE/1.7EY SOPN   0  . Vitamin D, Ergocalciferol, (DRISDOL) 1.25 MG (50000 UT) CAPS capsule Take 1 capsule (50,000 Units total) by mouth every 7 (seven) days. 4 capsule 0   No current facility-administered medications on file prior to visit.     PAST MEDICAL HISTORY: Past Medical History:  Diagnosis Date  . Anemia   . Arthritis of left knee   . Astigmatism   . Diabetes mellitus without complication ([place])   . Dry mouth   . Fatigue   . GERD (gastroesophageal reflux disease)   . Heartburn   . High cholesterol   . Hypertension   . Keratoconjunctivitis   . Muscle pain   . Vitamin D deficiency   . Weakness     PAST SURGICAL HISTORY: Past Surgical History:  Procedure Laterality Date  . DILATION AND CURETTAGE OF UTERUS    .  ENDOMETRIAL BIOPSY  12/19/2017   normal per pt  . TONSILLECTOMY    . TUBAL LIGATION      SOCIAL HISTORY: Social History   Tobacco Use  . Smoking status: Former Smoker    Years: 3.00  . Smokeless tobacco: Never Used  Substance Use Topics  . Alcohol use: No  . Drug use: No    FAMILY HISTORY: Family History  Problem Relation Age of Onset  . Diabetes Mother   . Hypertension Mother   . Hyperlipidemia Mother   . Obesity Mother   . Ovarian cancer Maternal Grandmother   . HIV Father        died from complications (IVDU)  . Alcohol abuse Father   . Drug abuse Father     ROS: Review of Systems  Constitutional: Positive for malaise/fatigue. Negative for weight loss.  Gastrointestinal: Negative for nausea and vomiting.  Musculoskeletal:       Negative muscle weakness   Endo/Heme/Allergies:       Negative hypoglycemia  PHYSICAL EXAM: Pt in no acute distress  RECENT LABS AND TESTS: BMET    Component Value Date/Time   NA 138 12/22/2018 1430   K 4.4 12/22/2018 1430   CL 102 12/22/2018 1430   CO2 17 (L) 12/22/2018 1430   GLUCOSE 133 (H) 12/22/2018 1430   GLUCOSE 172 (H) 01/21/2018 0825   BUN 7 12/22/2018 1430   CREATININE 0.58 12/22/2018 1430   CALCIUM 9.2 12/22/2018 1430   GFRNONAA 116 12/22/2018 1430   GFRAA 133 12/22/2018 1430   Lab Results  Component Value Date   HGBA1C 7.3 (H) 12/22/2018   HGBA1C 7.8 12/31/2017   Lab Results  Component Value Date   INSULIN 28.4 (H) 12/22/2018   CBC    Component Value Date/Time   WBC 7.1 12/22/2018 1430   WBC 6.4 01/21/2018 0825   RBC 5.99 (H) 12/22/2018 1430   RBC 5.81 (H) 01/21/2018 0825   HGB 13.3 12/22/2018 1430   HCT 43.4 12/22/2018 1430   PLT 301 12/22/2018 1430   MCV 73 (L) 12/22/2018 1430   MCH 22.2 (L) 12/22/2018 1430   MCH 22.9 (L) 06/25/2011 1240   MCHC 30.6 (L) 12/22/2018 1430   MCHC 31.5 01/21/2018 0825   RDW 15.9 (H) 12/22/2018 1430   LYMPHSABS 2.1 12/22/2018 1430   MONOABS 0.4 01/21/2018 0825    EOSABS 0.1 12/22/2018 1430   BASOSABS 0.1 12/22/2018 1430   Iron/TIBC/Ferritin/ %Sat No results found for: IRON, TIBC, FERRITIN, IRONPCTSAT Lipid Panel     Component Value Date/Time   CHOL 148 12/22/2018 1430   TRIG 61 12/22/2018 1430   HDL 40 12/22/2018 1430   CHOLHDL 3.7 12/22/2018 1430   CHOLHDL 4 01/21/2018 0825   VLDL 14.2 01/21/2018 0825   LDLCALC 96 12/22/2018 1430   Hepatic Function Panel     Component Value Date/Time   PROT 6.8 12/22/2018 1430   ALBUMIN 4.3 12/22/2018 1430   AST 15 12/22/2018 1430   ALT 21 12/22/2018 1430   ALKPHOS 87 12/22/2018 1430   BILITOT 0.3 12/22/2018 1430      Component Value Date/Time   TSH 0.837 12/22/2018 1430   TSH 1.67 01/21/2018 0825      I, Trixie Dredge, am acting as transcriptionist for Ilene Qua, MD  I have reviewed the above documentation for accuracy and completeness, and I agree with the above. - Ilene Qua, MD

## 2019-03-03 NOTE — Telephone Encounter (Signed)
Please advise 

## 2019-03-04 MED FILL — HYDROCODON-APAP 5-325: 5-325 | 2 days supply | Qty: 6 | Fill #0

## 2019-03-04 MED FILL — AMOXICILLIN 250 MG CAPSULE: 250 | 5 days supply | Qty: 15 | Fill #0

## 2019-03-09 MED FILL — XIGDUO XR 5 MG-1,000 MG TAB: 5-1000 | 90 days supply | Qty: 180 | Fill #0

## 2019-03-09 MED FILL — TRULICITY 1.5 MG/0.5 ML PEN: 1.5 | 28 days supply | Qty: 2 | Fill #0

## 2019-03-12 ENCOUNTER — Encounter: Payer: 59 | Admitting: Family

## 2019-03-16 ENCOUNTER — Encounter: Payer: Self-pay | Admitting: Family

## 2019-03-17 DIAGNOSIS — E1165 Type 2 diabetes mellitus with hyperglycemia: Secondary | ICD-10-CM | POA: Diagnosis not present

## 2019-03-17 DIAGNOSIS — E7800 Pure hypercholesterolemia, unspecified: Secondary | ICD-10-CM | POA: Diagnosis not present

## 2019-03-17 DIAGNOSIS — I10 Essential (primary) hypertension: Secondary | ICD-10-CM | POA: Diagnosis not present

## 2019-03-17 DIAGNOSIS — Z6841 Body Mass Index (BMI) 40.0 and over, adult: Secondary | ICD-10-CM | POA: Diagnosis not present

## 2019-03-17 MED FILL — AMLODIPINE BESYLATE 10 MG T: 10 | 30 days supply | Qty: 30 | Fill #1

## 2019-03-18 ENCOUNTER — Ambulatory Visit (INDEPENDENT_AMBULATORY_CARE_PROVIDER_SITE_OTHER): Payer: 59 | Admitting: Family Medicine

## 2019-03-18 ENCOUNTER — Encounter (INDEPENDENT_AMBULATORY_CARE_PROVIDER_SITE_OTHER): Payer: Self-pay | Admitting: Family Medicine

## 2019-03-18 ENCOUNTER — Other Ambulatory Visit: Payer: Self-pay

## 2019-03-18 DIAGNOSIS — E559 Vitamin D deficiency, unspecified: Secondary | ICD-10-CM | POA: Diagnosis not present

## 2019-03-18 DIAGNOSIS — E1165 Type 2 diabetes mellitus with hyperglycemia: Secondary | ICD-10-CM

## 2019-03-18 DIAGNOSIS — Z6841 Body Mass Index (BMI) 40.0 and over, adult: Secondary | ICD-10-CM

## 2019-03-18 MED ORDER — VITAMIN D (ERGOCALCIFEROL) 1.25 MG (50000 UNIT) PO CAPS: 50000.0000 [IU] | ORAL_CAPSULE | ORAL | 0 refills | Status: DC

## 2019-03-18 MED FILL — VIT D2 1.25 MG (50,000 UNIT: 1.25 MG | 28 days supply | Qty: 4 | Fill #0

## 2019-03-18 NOTE — Progress Notes (Signed)
Office: [phone]  /  Fax: [phone] TeleHealth Visit:  [name] has verbally consented to this TeleHealth visit today. The patient is located at home, the provider is located at the [facility] office. The participants in this visit include the listed provider and patient. The visit was conducted today via webex.  HPI:   Chief Complaint: OBESITY Elizabeth [name] is here to discuss her progress with her obesity treatment plan. She is on the keep a food journal with 450-600 calories and 40+ grams of protein at supper daily and follow the Category 3 plan and is following her eating plan approximately 50 % of the time. She states she is walking for 10 minutes 7 times per week. Elizabeth [name]'s weight is of 290 lbs today. She had a dental procedure right after her last appointment, so she is really only tolerating soft foods. She is really only struggling with dinner secondary to meat quantity.  We were unable to weigh the patient today for this TeleHealth visit. She feels as if she has lost 1 lb since her last visit. She has lost 5 lbs since starting treatment with [name].  Vitamin D Deficiency Elizabeth [name] has a diagnosis of vitamin D deficiency. She is currently taking prescription Vit D. She notes fatigue and denies nausea, vomiting or muscle weakness.  Diabetes II with Hyperglycemia Elizabeth [name] has a diagnosis of diabetes type II. [place] states her Endocrinologist did an A1c last week and it was 6.7. Her lowest BGs is around 107. She denies feelings of hypoglycemia or hypoglycemic events. She has been working on intensive lifestyle modifications including diet, exercise, and weight loss to help control her blood glucose levels.  ASSESSMENT AND PLAN:  Vitamin D deficiency - Plan: Vitamin D, Ergocalciferol, (DRISDOL) 1.25 MG (50000 UT) CAPS capsule  Type 2 diabetes mellitus with hyperglycemia, without long-term current use of insulin (HCC)  Class 3 severe obesity with serious comorbidity  and body mass index (BMI) of 50.0 to 59.9 in adult, unspecified obesity type ([place])  PLAN:  Vitamin D Deficiency Marypat was informed that low vitamin D levels contributes to fatigue and are associated with obesity, breast, and colon cancer. Angla agrees to continue taking prescription Vit D @50 ,000 IU every week #4 and we will refill for 1 month. She will follow up for routine testing of vitamin D, at least 2-3 times per year. She was informed of the risk of over-replacement of vitamin D and agrees to not increase her dose unless she discusses this with [name] first. [place] agrees to follow up with our clinic in 2 weeks.  Diabetes II with Hyperglycemia Elizabeth [name] has been given extensive diabetes education by myself today including ideal fasting and post-prandial blood glucose readings, individual ideal Hgb A1c goals and hypoglycemia prevention. We discussed the importance of good blood sugar control to decrease the likelihood of diabetic complications such as nephropathy, neuropathy, limb loss, blindness, coronary artery disease, and death. We discussed the importance of intensive lifestyle modification including diet, exercise and weight loss as the first line treatment for diabetes. Elizabeth [name] agrees to continue her diabetes medications, and she is to MyChart if fasting BGs fall between 90 and 110 for 3+ days to be able to decrease her diabetes medications. [place] agrees to follow up with our clinic in 2 weeks.  Obesity Elizabeth [name] is currently in the action stage of change. As such, her goal is to continue with weight loss efforts She has agreed to keep a food journal with 450-600 calories and 40+ grams  of protein at supper daily and follow the Category 3 plan Bernestine has been instructed to work up to a goal of 150 minutes of combined cardio and strengthening exercise per week for weight loss and overall health benefits. We discussed the following Behavioral Modification Strategies today: increasing lean  protein intake, increasing vegetables and work on meal planning and easy cooking plans, keeping healthy foods in the home, better snacking choices, and planning for success   Elizabeth [name] has agreed to follow up with our clinic in 2 weeks. She was informed of the importance of frequent follow up visits to maximize her success with intensive lifestyle modifications for her multiple health conditions.  ALLERGIES: No Known Allergies  MEDICATIONS: Current Outpatient Medications on File Prior to Visit  Medication Sig Dispense Refill  . albuterol (PROVENTIL HFA;VENTOLIN HFA) 108 (90 Base) MCG/ACT inhaler Inhale into the lungs.    [name] amLODipine (NORVASC) 10 MG tablet TAKE 1 TABLET (10 MG TOTAL) BY MOUTH DAILY. 30 tablet 5  . Dapagliflozin-metFORMIN HCl ER 02-999 MG TB24 Take by mouth.    . etonogestrel (NEXPLANON) 68 MG IMPL implant 1 each by Subdermal route once.    [name] glipiZIDE (GLUCOTROL) 10 MG tablet TAKE 1 TABLET BY MOUTH TWO TIMES DAILY BEFORE A MEAL 60 tablet 0  . ipratropium (ATROVENT) 0.02 % nebulizer solution Take 2.5 mLs (0.5 mg total) by nebulization 4 (four) times daily. 75 mL 12  . lisinopril (ZESTRIL) 40 MG tablet Take 1 tablet (40 mg total) by mouth daily. 90 tablet 1  . lovastatin (MEVACOR) 40 MG tablet Take 1 tablet by mouth at bedtime.    . meloxicam (MOBIC) 7.5 MG tablet Take 7.5 mg by mouth daily.    . norethindrone (MICRONOR) 0.35 MG tablet TAKE 1 TABLET (0.35 MG TOTAL) BY MOUTH DAILY. 28 tablet 11  . omeprazole (PRILOSEC) 20 MG capsule Take 20 mg by mouth daily.    . TRULICITY 9.47 SJ/6.2EZ SOPN   0   No current facility-administered medications on file prior to visit.     PAST MEDICAL HISTORY: Past Medical History:  Diagnosis Date  . Anemia   . Arthritis of left knee   . Astigmatism   . Diabetes mellitus without complication ([place])   . Dry mouth   . Fatigue   . GERD (gastroesophageal reflux disease)   . Heartburn   . High cholesterol   . Hypertension   .  Keratoconjunctivitis   . Muscle pain   . Vitamin D deficiency   . Weakness     PAST SURGICAL HISTORY: Past Surgical History:  Procedure Laterality Date  . DILATION AND CURETTAGE OF UTERUS    . ENDOMETRIAL BIOPSY  12/19/2017   normal per pt  . TONSILLECTOMY    . TUBAL LIGATION      SOCIAL HISTORY: Social History   Tobacco Use  . Smoking status: Former Smoker    Years: 3.00  . Smokeless tobacco: Never Used  Substance Use Topics  . Alcohol use: No  . Drug use: No    FAMILY HISTORY: Family History  Problem Relation Age of Onset  . Diabetes Mother   . Hypertension Mother   . Hyperlipidemia Mother   . Obesity Mother   . Ovarian cancer Maternal Grandmother   . HIV Father        died from complications (IVDU)  . Alcohol abuse Father   . Drug abuse Father     ROS: Review of Systems  Constitutional: Positive for malaise/fatigue and weight loss.  Gastrointestinal: Negative for nausea and vomiting.  Musculoskeletal:       Negative muscle weakness  Endo/Heme/Allergies:       Negative hypoglycemia    PHYSICAL EXAM: Pt in no acute distress  RECENT LABS AND TESTS: BMET    Component Value Date/Time   NA 138 12/22/2018 1430   K 4.4 12/22/2018 1430   CL 102 12/22/2018 1430   CO2 17 (L) 12/22/2018 1430   GLUCOSE 133 (H) 12/22/2018 1430   GLUCOSE 172 (H) 01/21/2018 0825   BUN 7 12/22/2018 1430   CREATININE 0.58 12/22/2018 1430   CALCIUM 9.2 12/22/2018 1430   GFRNONAA 116 12/22/2018 1430   GFRAA 133 12/22/2018 1430   Lab Results  Component Value Date   HGBA1C 7.3 (H) 12/22/2018   HGBA1C 7.8 12/31/2017   Lab Results  Component Value Date   INSULIN 28.4 (H) 12/22/2018   CBC    Component Value Date/Time   WBC 7.1 12/22/2018 1430   WBC 6.4 01/21/2018 0825   RBC 5.99 (H) 12/22/2018 1430   RBC 5.81 (H) 01/21/2018 0825   HGB 13.3 12/22/2018 1430   HCT 43.4 12/22/2018 1430   PLT 301 12/22/2018 1430   MCV 73 (L) 12/22/2018 1430   MCH 22.2 (L) 12/22/2018  1430   MCH 22.9 (L) 06/25/2011 1240   MCHC 30.6 (L) 12/22/2018 1430   MCHC 31.5 01/21/2018 0825   RDW 15.9 (H) 12/22/2018 1430   LYMPHSABS 2.1 12/22/2018 1430   MONOABS 0.4 01/21/2018 0825   EOSABS 0.1 12/22/2018 1430   BASOSABS 0.1 12/22/2018 1430   Iron/TIBC/Ferritin/ %Sat No results found for: IRON, TIBC, FERRITIN, IRONPCTSAT Lipid Panel     Component Value Date/Time   CHOL 148 12/22/2018 1430   TRIG 61 12/22/2018 1430   HDL 40 12/22/2018 1430   CHOLHDL 3.7 12/22/2018 1430   CHOLHDL 4 01/21/2018 0825   VLDL 14.2 01/21/2018 0825   LDLCALC 96 12/22/2018 1430   Hepatic Function Panel     Component Value Date/Time   PROT 6.8 12/22/2018 1430   ALBUMIN 4.3 12/22/2018 1430   AST 15 12/22/2018 1430   ALT 21 12/22/2018 1430   ALKPHOS 87 12/22/2018 1430   BILITOT 0.3 12/22/2018 1430      Component Value Date/Time   TSH 0.837 12/22/2018 1430   TSH 1.67 01/21/2018 0825      I, Trixie Dredge, am acting as transcriptionist for Ilene Qua, MD  I have reviewed the above documentation for accuracy and completeness, and I agree with the above. - Ilene Qua, MD

## 2019-03-23 NOTE — Telephone Encounter (Signed)
Called but n/a left voice mail for patient to call us back and set up a physical appointment.

## 2019-04-05 ENCOUNTER — Other Ambulatory Visit: Payer: Self-pay | Admitting: Family

## 2019-04-05 ENCOUNTER — Ambulatory Visit (INDEPENDENT_AMBULATORY_CARE_PROVIDER_SITE_OTHER): Payer: 59 | Admitting: Family Medicine

## 2019-04-05 MED FILL — TRULICITY 1.5 MG/0.5 ML PEN: 1.5 | 28 days supply | Qty: 2 | Fill #1

## 2019-04-07 ENCOUNTER — Encounter: Payer: Self-pay | Admitting: Family

## 2019-04-07 ENCOUNTER — Other Ambulatory Visit: Payer: Self-pay | Admitting: Family

## 2019-04-07 MED ORDER — GLIPIZIDE 10 MG PO TABS: 10.0000 mg | ORAL_TABLET | Freq: Two times a day (BID) | ORAL | 0 refills | Status: DC

## 2019-04-07 MED FILL — glipiZIDE 10 MG TABS: 10 | 30 days supply | Qty: 60 | Fill #0

## 2019-04-08 ENCOUNTER — Other Ambulatory Visit: Payer: Self-pay

## 2019-04-08 ENCOUNTER — Encounter (INDEPENDENT_AMBULATORY_CARE_PROVIDER_SITE_OTHER): Payer: Self-pay | Admitting: Family Medicine

## 2019-04-08 ENCOUNTER — Ambulatory Visit (INDEPENDENT_AMBULATORY_CARE_PROVIDER_SITE_OTHER): Payer: 59 | Admitting: Family Medicine

## 2019-04-08 DIAGNOSIS — Z6841 Body Mass Index (BMI) 40.0 and over, adult: Secondary | ICD-10-CM | POA: Diagnosis not present

## 2019-04-08 DIAGNOSIS — E1165 Type 2 diabetes mellitus with hyperglycemia: Secondary | ICD-10-CM | POA: Diagnosis not present

## 2019-04-08 DIAGNOSIS — I10 Essential (primary) hypertension: Secondary | ICD-10-CM

## 2019-04-08 MED ORDER — GLIPIZIDE 10 MG PO TABS: 10.0000 mg | ORAL_TABLET | Freq: Every day | ORAL | 0 refills | Status: DC

## 2019-04-10 MED FILL — AMLODIPINE BESYLATE 10 MG T: 10 | 30 days supply | Qty: 30 | Fill #2

## 2019-04-12 NOTE — Progress Notes (Signed)
Office: [phone]  /  Fax: [phone] TeleHealth Visit:  Elizabeth [name] has verbally consented to this TeleHealth visit today. The patient is located at home, the provider is located at the [facility] office. The participants in this visit include the listed provider and patient. The visit was conducted today via webex.  HPI:   Chief Complaint: OBESITY Elizabeth [name] is here to discuss her progress with her obesity treatment plan. She is on the keep a food journal with 450-600 calories and 40+ grams of protein at supper daily and follow the Category 3 plan and is following her eating plan approximately 65-70 % of the time. She states she is walking for 10 minutes 3 times per week. Elizabeth [name] is doing well following the plan for breakfast and lunch, but often struggling with dinner secondary to fatigue, convenience, and lack of food readily available. She is not weighing herself at home, but weighing at work (290 lbs this morning).  We were unable to weigh the patient today for this TeleHealth visit. She feels as if she has maintained her weight since her last visit. She has lost 5 lbs since starting treatment with [name].  Diabetes II with Hyperglycemia Elizabeth [name] has a diagnosis of diabetes type II. Elizabeth [name] states her BGs have been controlled even without glipizide (in 130's). She denies hypoglycemia. Last Hgb A1c improved to 6.7 (done 3 weeks ago). She has been working on intensive lifestyle modifications including diet, exercise, and weight loss to help control her blood glucose levels.  Hypertension Elizabeth [name] is a 43 y.o. female with hypertension. Elizabeth [name]'s blood pressure is well controlled. She denies chest pain, chest pressure, or headaches. She is working on weight loss to help control her blood pressure with the goal of decreasing her risk of heart attack and stroke.   ASSESSMENT AND PLAN:  Type 2 diabetes mellitus with hyperglycemia, without long-term current use of insulin  (HCC)  Essential hypertension  Class 3 severe obesity with serious comorbidity and body mass index (BMI) of 50.0 to 59.9 in adult, unspecified obesity type ([place])  PLAN:  Diabetes II with Hyperglycemia Elizabeth [name] has been given extensive diabetes education by myself today including ideal fasting and post-prandial blood glucose readings, individual ideal Hgb A1c goals and hypoglycemia prevention. We discussed the importance of good blood sugar control to decrease the likelihood of diabetic complications such as nephropathy, neuropathy, limb loss, blindness, coronary artery disease, and death. We discussed the importance of intensive lifestyle modification including diet, exercise and weight loss as the first line treatment for diabetes. Elizabeth [name] agrees to decrease glipizide to 10 mg q AM only. Elizabeth [name] agrees to follow up with our clinic in 2 weeks.  Hypertension We discussed sodium restriction, working on healthy weight loss, and a regular exercise program as the means to achieve improved blood pressure control. Elizabeth [name] agreed with this plan and agreed to follow up as directed. We will continue to monitor her blood pressure as well as her progress with the above lifestyle modifications. Elizabeth [name] agrees to continue her current medications and will watch for signs of hypotension as she continues her lifestyle modifications. Elizabeth [name] agrees to follow up with our clinic in 2 weeks.  Obesity Elizabeth [name] is currently in the action stage of change. As such, her goal is to continue with weight loss efforts She has agreed to keep a food journal with 450-600 calories and 40+ grams of protein at supper daily and follow the Category 3 plan Elizabeth [name] has been instructed to work up to  a goal of 150 minutes of combined cardio and strengthening exercise per week for weight loss and overall health benefits. We discussed the following Behavioral Modification Strategies today: increasing lean protein intake, increasing vegetables  and work on meal planning and easy cooking plans, keeping healthy foods in the home, better snacking choices, and planning for success   Elizabeth [name] has agreed to follow up with our clinic in 2 weeks. She was informed of the importance of frequent follow up visits to maximize her success with intensive lifestyle modifications for her multiple health conditions.  ALLERGIES: No Known Allergies  MEDICATIONS: Current Outpatient Medications on File Prior to Visit  Medication Sig Dispense Refill  . albuterol (PROVENTIL HFA;VENTOLIN HFA) 108 (90 Base) MCG/ACT inhaler Inhale into the lungs.    [name] amLODipine (NORVASC) 10 MG tablet TAKE 1 TABLET (10 MG TOTAL) BY MOUTH DAILY. 30 tablet 5  . Dapagliflozin-metFORMIN HCl ER 02-999 MG TB24 Take by mouth.    . etonogestrel (NEXPLANON) 68 MG IMPL implant 1 each by Subdermal route once.    [name] ipratropium (ATROVENT) 0.02 % nebulizer solution Take 2.5 mLs (0.5 mg total) by nebulization 4 (four) times daily. 75 mL 12  . lisinopril (ZESTRIL) 40 MG tablet Take 1 tablet (40 mg total) by mouth daily. 90 tablet 1  . lovastatin (MEVACOR) 40 MG tablet Take 1 tablet by mouth at bedtime.    . meloxicam (MOBIC) 7.5 MG tablet Take 7.5 mg by mouth daily.    . norethindrone (MICRONOR) 0.35 MG tablet TAKE 1 TABLET (0.35 MG TOTAL) BY MOUTH DAILY. 28 tablet 11  . omeprazole (PRILOSEC) 20 MG capsule Take 20 mg by mouth daily.    . TRULICITY 0.38 UE/2.8MK SOPN   0  . Vitamin D, Ergocalciferol, (DRISDOL) 1.25 MG (50000 UT) CAPS capsule Take 1 capsule (50,000 Units total) by mouth every 7 (seven) days. 4 capsule 0   No current facility-administered medications on file prior to visit.     PAST MEDICAL HISTORY: Past Medical History:  Diagnosis Date  . Anemia   . Arthritis of left knee   . Astigmatism   . Diabetes mellitus without complication ([place])   . Dry mouth   . Fatigue   . GERD (gastroesophageal reflux disease)   . Heartburn   . High cholesterol   . Hypertension   .  Keratoconjunctivitis   . Muscle pain   . Vitamin D deficiency   . Weakness     PAST SURGICAL HISTORY: Past Surgical History:  Procedure Laterality Date  . DILATION AND CURETTAGE OF UTERUS    . ENDOMETRIAL BIOPSY  12/19/2017   normal per pt  . TONSILLECTOMY    . TUBAL LIGATION      SOCIAL HISTORY: Social History   Tobacco Use  . Smoking status: Former Smoker    Years: 3.00  . Smokeless tobacco: Never Used  Substance Use Topics  . Alcohol use: No  . Drug use: No    FAMILY HISTORY: Family History  Problem Relation Age of Onset  . Diabetes Mother   . Hypertension Mother   . Hyperlipidemia Mother   . Obesity Mother   . Ovarian cancer Maternal Grandmother   . HIV Father        died from complications (IVDU)  . Alcohol abuse Father   . Drug abuse Father     ROS: Review of Systems  Constitutional: Negative for weight loss.  Cardiovascular: Negative for chest pain.       Negative chest pressure  Neurological: Negative for headaches.  Endo/Heme/Allergies:       Negative hypoglycemia    PHYSICAL EXAM: Pt in no acute distress  RECENT LABS AND TESTS: BMET    Component Value Date/Time   NA 138 12/22/2018 1430   K 4.4 12/22/2018 1430   CL 102 12/22/2018 1430   CO2 17 (L) 12/22/2018 1430   GLUCOSE 133 (H) 12/22/2018 1430   GLUCOSE 172 (H) 01/21/2018 0825   BUN 7 12/22/2018 1430   CREATININE 0.58 12/22/2018 1430   CALCIUM 9.2 12/22/2018 1430   GFRNONAA 116 12/22/2018 1430   GFRAA 133 12/22/2018 1430   Lab Results  Component Value Date   HGBA1C 7.3 (H) 12/22/2018   HGBA1C 7.8 12/31/2017   Lab Results  Component Value Date   INSULIN 28.4 (H) 12/22/2018   CBC    Component Value Date/Time   WBC 7.1 12/22/2018 1430   WBC 6.4 01/21/2018 0825   RBC 5.99 (H) 12/22/2018 1430   RBC 5.81 (H) 01/21/2018 0825   HGB 13.3 12/22/2018 1430   HCT 43.4 12/22/2018 1430   PLT 301 12/22/2018 1430   MCV 73 (L) 12/22/2018 1430   MCH 22.2 (L) 12/22/2018 1430   MCH  22.9 (L) 06/25/2011 1240   MCHC 30.6 (L) 12/22/2018 1430   MCHC 31.5 01/21/2018 0825   RDW 15.9 (H) 12/22/2018 1430   LYMPHSABS 2.1 12/22/2018 1430   MONOABS 0.4 01/21/2018 0825   EOSABS 0.1 12/22/2018 1430   BASOSABS 0.1 12/22/2018 1430   Iron/TIBC/Ferritin/ %Sat No results found for: IRON, TIBC, FERRITIN, IRONPCTSAT Lipid Panel     Component Value Date/Time   CHOL 148 12/22/2018 1430   TRIG 61 12/22/2018 1430   HDL 40 12/22/2018 1430   CHOLHDL 3.7 12/22/2018 1430   CHOLHDL 4 01/21/2018 0825   VLDL 14.2 01/21/2018 0825   LDLCALC 96 12/22/2018 1430   Hepatic Function Panel     Component Value Date/Time   PROT 6.8 12/22/2018 1430   ALBUMIN 4.3 12/22/2018 1430   AST 15 12/22/2018 1430   ALT 21 12/22/2018 1430   ALKPHOS 87 12/22/2018 1430   BILITOT 0.3 12/22/2018 1430      Component Value Date/Time   TSH 0.837 12/22/2018 1430   TSH 1.67 01/21/2018 0825      I, Trixie Dredge, am acting as transcriptionist for Ilene Qua, MD  I have reviewed the above documentation for accuracy and completeness, and I agree with the above. - Ilene Qua, MD

## 2019-04-19 ENCOUNTER — Encounter: Payer: 59 | Admitting: Family

## 2019-04-20 ENCOUNTER — Other Ambulatory Visit: Payer: Self-pay

## 2019-04-20 ENCOUNTER — Ambulatory Visit (INDEPENDENT_AMBULATORY_CARE_PROVIDER_SITE_OTHER): Payer: 59 | Admitting: Family

## 2019-04-20 ENCOUNTER — Encounter: Payer: Self-pay | Admitting: Family

## 2019-04-20 VITALS — BP 134/85 | HR 91 | Temp 98.3°F | Resp 16 | Ht 62.5 in | Wt 293.0 lb

## 2019-04-20 DIAGNOSIS — N760 Acute vaginitis: Secondary | ICD-10-CM

## 2019-04-20 DIAGNOSIS — I10 Essential (primary) hypertension: Secondary | ICD-10-CM

## 2019-04-20 DIAGNOSIS — Z0000 Encounter for general adult medical examination without abnormal findings: Secondary | ICD-10-CM

## 2019-04-20 DIAGNOSIS — R0789 Other chest pain: Secondary | ICD-10-CM

## 2019-04-20 DIAGNOSIS — E1165 Type 2 diabetes mellitus with hyperglycemia: Secondary | ICD-10-CM | POA: Diagnosis not present

## 2019-04-20 DIAGNOSIS — E785 Hyperlipidemia, unspecified: Secondary | ICD-10-CM | POA: Diagnosis not present

## 2019-04-20 MED ORDER — FLUCONAZOLE 150 MG PO TABS: 150.0000 mg | ORAL_TABLET | Freq: Once | ORAL | 0 refills | Status: AC

## 2019-04-20 MED FILL — FLUCONAZOLE 150 MG TABS: 150 | 1 days supply | Qty: 1 | Fill #0

## 2019-04-20 NOTE — Addendum Note (Signed)
Addended by: [name] on: 04/20/2019 11:51 AM   Modules accepted: Orders

## 2019-04-20 NOTE — Progress Notes (Addendum)
Subjective:    Patient ID: Elizabeth [name], female    DOB: [birth year], 43 y.o.   MRN: [id number]  HPI  Patient is a 43 yr old female who presents today for cpx and follow up.  Patient presents today for complete physical.  Immunizations: up to date Diet: working on improved diet Exercise: walks 2-3 days a week Pap Smear: 11/21/17 (Dr. Gala Romney) Mammogram: 03/07/18 Vision: 03/21/17, reports that she had an eye exam scheduled for next week.  Dental:  11/20  She reports + vaginal itching and mild discharge, "I think I am getting a yeast infection."   DM2- She is being followed by endocrinology. Maintained farxiga/metformin, glucotrol. Reports that she saw endo and A1C last month was 6.7. Lab Results  Component Value Date   HGBA1C 7.3 (H) 12/22/2018   HGBA1C 7.8 12/31/2017   Lab Results  Component Value Date   LDLCALC 96 12/22/2018   CREATININE 0.58 12/22/2018   Hypertension-  Maintained on amlodipine, lisinopril BP Readings from Last 3 Encounters:  01/05/19 138/80  12/22/18 138/85  09/11/18 121/74   Hyperlipidemia- maintained on mevacor.  Lab Results  Component Value Date   CHOL 148 12/22/2018   HDL 40 12/22/2018   LDLCALC 96 12/22/2018   TRIG 61 12/22/2018   CHOLHDL 3.7 12/22/2018   Morbid obesity- she is working with the medical weight loss clinic.  Wt Readings from Last 3 Encounters:  01/05/19 293 lb (132.9 kg)  12/22/18 298 lb (135.2 kg)  09/11/18 (!) 304 lb 9.6 oz (138.2 kg)   gerd- maintained on PPI (omeprazole).  Using on prn basis.    Review of Systems  Constitutional: Negative for unexpected weight change.  HENT: Negative for hearing loss and rhinorrhea.   Eyes: Negative for visual disturbance.  Respiratory: Negative for cough and shortness of breath.   Cardiovascular: Positive for chest pain. Negative for palpitations and leg swelling.       Chest pain is not worsened by activity  Gastrointestinal: Negative for blood in stool, constipation, diarrhea,  nausea and vomiting.  Genitourinary: Negative for dysuria, frequency, hematuria and menstrual problem (amenorrhea due to nexplanon).  Musculoskeletal: Negative for arthralgias and myalgias.  Skin: Negative for rash.   Past Medical History:  Diagnosis Date  . Anemia   . Arthritis of left knee   . Astigmatism   . Diabetes mellitus without complication ([place])   . Dry mouth   . Fatigue   . GERD (gastroesophageal reflux disease)   . Heartburn   . High cholesterol   . Hypertension   . Keratoconjunctivitis   . Muscle pain   . Vitamin D deficiency   . Weakness      Social History   Socioeconomic History  . Marital status: Married    Spouse name: [name]  . Number of children: 2  . Years of education: Not on file  . Highest education level: Not on file  Occupational History  . Occupation: [employer]  . Financial resource strain: Not on file  . Food insecurity    Worry: Not on file    Inability: Not on file  . Transportation needs    Medical: Not on file    Non-medical: Not on file  Tobacco Use  . Smoking status: Former Smoker    Years: 3.00  . Smokeless tobacco: Never Used  Substance and Sexual Activity  . Alcohol use: No  . Drug use: No  . Sexual  activity: Yes    Birth control/protection: Surgical, Implant  Lifestyle  . Physical activity    Days per week: Not on file    Minutes per session: Not on file  . Stress: Not on file  Relationships  . Social Herbalist on phone: Not on file    Gets together: Not on file    Attends religious service: Not on file    Active member of club or organization: Not on file    Attends meetings of clubs or organizations: Not on file    Relationship status: Not on file  . Intimate partner violence    Fear of current or ex partner: Not on file    Emotionally abused: Not on file    Physically abused: Not on file    Forced sexual activity: Not on file  Other Topics  Concern  . Not on file  Social History Narrative   Married 2 children   1997- son Allen Kell   2000- son Insurance claims handler in [place] in [place]   Enjoys reading    Past Surgical History:  Procedure Laterality Date  . DILATION AND CURETTAGE OF UTERUS    . ENDOMETRIAL BIOPSY  12/19/2017   normal per pt  . TONSILLECTOMY    . TUBAL LIGATION      Family History  Problem Relation Age of Onset  . Diabetes Mother   . Hypertension Mother   . Hyperlipidemia Mother   . Obesity Mother   . Ovarian cancer Maternal Grandmother   . HIV Father        died from complications (IVDU)  . Alcohol abuse Father   . Drug abuse Father     No Known Allergies  Current Outpatient Medications on File Prior to Visit  Medication Sig Dispense Refill  . amLODipine (NORVASC) 10 MG tablet TAKE 1 TABLET (10 MG TOTAL) BY MOUTH DAILY. 30 tablet 5  . Dapagliflozin-metFORMIN HCl ER 02-999 MG TB24 Take by mouth.    . Dulaglutide 1.5 MG/0.5ML SOPN Inject into the skin.    [name] etonogestrel (NEXPLANON) 68 MG IMPL implant 1 each by Subdermal route once.    [name] glipiZIDE (GLUCOTROL) 10 MG tablet Take 1 tablet (10 mg total) by mouth daily before breakfast. 30 tablet 0  . lisinopril (ZESTRIL) 40 MG tablet Take 1 tablet (40 mg total) by mouth daily. 90 tablet 1  . lovastatin (MEVACOR) 40 MG tablet Take 1 tablet by mouth at bedtime.    . meloxicam (MOBIC) 7.5 MG tablet Take 7.5 mg by mouth daily.    [name] omeprazole (PRILOSEC) 20 MG capsule Take 20 mg by mouth daily.    . Vitamin D, Ergocalciferol, (DRISDOL) 1.25 MG (50000 UT) CAPS capsule Take 1 capsule (50,000 Units total) by mouth every 7 (seven) days. 4 capsule 0  . albuterol (PROVENTIL HFA;VENTOLIN HFA) 108 (90 Base) MCG/ACT inhaler Inhale into the lungs.    . norethindrone (MICRONOR) 0.35 MG tablet TAKE 1 TABLET (0.35 MG TOTAL) BY MOUTH DAILY. 28 tablet 11   No current facility-administered medications on file prior to visit.     BP 134/85 (BP  Location: Right Arm, Patient Position: Sitting, Cuff Size: Large)   Pulse 91   Temp 98.3 F (36.8 C) (Oral)   Resp 16   Ht 5' 2.5" (1.588 m)   Wt 293 lb (132.9 kg)   SpO2 100%   BMI 52.74 kg/m       Objective:   Physical Exam  Physical Exam  Constitutional: She is oriented to person, place, and time. She appears well-developed and well-nourished. No distress.  HENT:  Head: Normocephalic and atraumatic.  Right Ear: Tympanic membrane and ear canal normal.  Left Ear: Tympanic membrane and ear canal normal.  Mouth/Throat: Oropharynx is clear and moist.  Eyes: Pupils are equal, round, and reactive to light. No scleral icterus.  Neck: Normal range of motion. No thyromegaly present.  Cardiovascular: Normal rate and regular rhythm.   No murmur heard. Pulmonary/Chest: Effort normal and breath sounds normal. No respiratory distress. He has no wheezes. She has no rales. She exhibits no tenderness.  Abdominal: Soft. Bowel sounds are normal. She exhibits no distension and no mass. There is no tenderness. There is no rebound and no guarding.  Musculoskeletal: She exhibits no edema.  Lymphadenopathy:    She has no cervical adenopathy.  Neurological: She is alert and oriented to person, place, and time. She has normal patellar reflexes. She exhibits normal muscle tone. Coordination normal.  Skin: Skin is warm and dry.  Psychiatric: She has a normal mood and affect. Her behavior is normal. Judgment and thought content normal.  Breast/pelvic: deferred         Assessment & Plan:   Preventative care- discussed healthy diet, exercise and encouraged continued weight loss. Refer for mammogram. Pap and immunizations reviewed and up to date.  Atypical chest pain- EKG tracing is personally reviewed.  EKG notes NSR.  No acute changes.  Will refer to cardiology for further evaluation. Pt is advised to go to the ER if she develops severe chest pain or chest pain that does not resolve on its own in a  few minutes. You should be contacted about scheduling your appointment with cardiology.   DM2- improved and at goal. Defer management to endocrinology.  HTN- bp stable, continue current medications.  Hyperlipidemia- LDL at goal, continue statin.  Vaginitis- will rx with diflucan.  Assessment & Plan:

## 2019-04-20 NOTE — Patient Instructions (Signed)
Please go to ER if you develop severe chest pain or chest pain that does not resolve on its own in a few minutes. You should be contacted about scheduling your appointment with cardiology.

## 2019-04-22 ENCOUNTER — Encounter (INDEPENDENT_AMBULATORY_CARE_PROVIDER_SITE_OTHER): Payer: Self-pay | Admitting: Family Medicine

## 2019-04-22 ENCOUNTER — Telehealth (INDEPENDENT_AMBULATORY_CARE_PROVIDER_SITE_OTHER): Payer: 59 | Admitting: Family Medicine

## 2019-04-22 DIAGNOSIS — E559 Vitamin D deficiency, unspecified: Secondary | ICD-10-CM | POA: Diagnosis not present

## 2019-04-22 MED ORDER — VITAMIN D (ERGOCALCIFEROL) 1.25 MG (50000 UNIT) PO CAPS: 50000.0000 [IU] | ORAL_CAPSULE | ORAL | 0 refills | Status: DC

## 2019-04-22 MED FILL — VIT D2 1.25 MG (50,000 UNIT: 1.25 MG | 28 days supply | Qty: 4 | Fill #0

## 2019-04-27 ENCOUNTER — Other Ambulatory Visit: Payer: Self-pay

## 2019-04-27 ENCOUNTER — Ambulatory Visit (HOSPITAL_BASED_OUTPATIENT_CLINIC_OR_DEPARTMENT_OTHER)
Admission: RE | Admit: 2019-04-27 | Discharge: 2019-04-27 | Disposition: A | Discharge status: Home / Self Care | Payer: 59 | Source: Ambulatory Visit | Attending: Family | Admitting: Family

## 2019-04-27 ENCOUNTER — Encounter (HOSPITAL_BASED_OUTPATIENT_CLINIC_OR_DEPARTMENT_OTHER): Payer: Self-pay

## 2019-04-27 DIAGNOSIS — Z0000 Encounter for general adult medical examination without abnormal findings: Secondary | ICD-10-CM | POA: Insufficient documentation

## 2019-04-27 DIAGNOSIS — Z1231 Encounter for screening mammogram for malignant neoplasm of breast: Secondary | ICD-10-CM | POA: Insufficient documentation

## 2019-04-27 NOTE — Progress Notes (Signed)
Office: [phone]  /  Fax: [phone] TeleHealth Visit:  [name] has verbally consented to this TeleHealth visit today. The patient is located at home, the provider is located at the [facility] office. The participants in this visit include the listed provider and patient. The visit was conducted today via webex.  HPI:   Chief Complaint: OBESITY Elizabeth [name] is here to discuss her progress with her obesity treatment plan. She is on the keep a food journal with 450-600 calories and 40+ grams of protein at supper daily and follow the Category 3 plan and is following her eating plan approximately 80 % of the time. She states she is walking for 10 minutes 2-3 times per week. Dream is following the plan for most of the day, but she has had significant carbohydrate cravings. This has been in the form of pizza, and [place] food. She states her blood pressure is 134/82. We were unable to weigh the patient today for this TeleHealth visit. She feels as if she has maintained her weight since her last visit. She has lost 5 lbs since starting treatment with [name].  Vitamin D Deficiency [name] has a diagnosis of vitamin D deficiency. She is currently taking prescription Vit D. She notes fatigue and denies nausea, vomiting or muscle weakness.  Diabetes II with Hyperglycemia [name] has a diagnosis of diabetes type II. [name] states she stopped glipizide in the evening. She states her BGs range between 150 and 170's, highest BGs average at 174. She is now taking glipizide in the morning, and Dapagliflozin-metformin. She denies hypoglycemia. Last A1c was 7.3. She has been working on intensive lifestyle modifications including diet, exercise, and weight loss to help control her blood glucose levels.  ASSESSMENT AND PLAN:  Vitamin D deficiency - Plan: Vitamin D, Ergocalciferol, (DRISDOL) 1.25 MG (50000 UT) CAPS capsule  PLAN:  Vitamin D Deficiency [name] was informed that low  vitamin D levels contributes to fatigue and are associated with obesity, breast, and colon cancer. [name] agrees to continue taking prescription Vit D 50,000 IU every week #4 and we will refill for 1 month. She will follow up for routine testing of vitamin D, at least 2-3 times per year. She was informed of the risk of over-replacement of vitamin D and agrees to not increase her dose unless she discusses this with [name] first. [name] agrees to follow up with our clinic in 2 weeks.  Diabetes II with Hyperglycemia [name] has been given extensive diabetes education by myself today including ideal fasting and post-prandial blood glucose readings, individual ideal Hgb A1c goals and hypoglycemia prevention. We discussed the importance of good blood sugar control to decrease the likelihood of diabetic complications such as nephropathy, neuropathy, limb loss, blindness, coronary artery disease, and death. We discussed the importance of intensive lifestyle modification including diet, exercise and weight loss as the first line treatment for diabetes. [name] agrees to continue her diabetes medications and she will continue checking her BGs. [name] agrees to follow up with our clinic in 2 weeks.  Obesity [name] is currently in the action stage of change. As such, her goal is to continue with weight loss efforts She has agreed to keep a food journal with 450-600 calories and 40+ grams of protein at supper daily and follow the Category 3 plan [name] has been instructed to work up to a goal of 150 minutes of combined cardio and strengthening exercise per week for weight loss and overall health benefits. We discussed the following Behavioral  Modification Strategies today: increasing lean protein intake, increasing vegetables and work on meal planning and easy cooking plans, keeping healthy foods in the home, and planning for success   [name] has agreed to follow up with our clinic in 2 weeks. She was informed of  the importance of frequent follow up visits to maximize her success with intensive lifestyle modifications for her multiple health conditions.  ALLERGIES: No Known Allergies  MEDICATIONS: Current Outpatient Medications on File Prior to Visit  Medication Sig Dispense Refill  . albuterol (PROVENTIL HFA;VENTOLIN HFA) 108 (90 Base) MCG/ACT inhaler Inhale into the lungs.    [name] amLODipine (NORVASC) 10 MG tablet TAKE 1 TABLET (10 MG TOTAL) BY MOUTH DAILY. 30 tablet 5  . Dapagliflozin-metFORMIN HCl ER 02-999 MG TB24 Take by mouth.    . Dulaglutide 1.5 MG/0.5ML SOPN Inject into the skin.    [name] etonogestrel (NEXPLANON) 68 MG IMPL implant 1 each by Subdermal route once.    [name] glipiZIDE (GLUCOTROL) 10 MG tablet Take 1 tablet (10 mg total) by mouth daily before breakfast. 30 tablet 0  . lisinopril (ZESTRIL) 40 MG tablet Take 1 tablet (40 mg total) by mouth daily. 90 tablet 1  . lovastatin (MEVACOR) 40 MG tablet Take 1 tablet by mouth at bedtime.    . meloxicam (MOBIC) 7.5 MG tablet Take 7.5 mg by mouth daily.    . norethindrone (MICRONOR) 0.35 MG tablet TAKE 1 TABLET (0.35 MG TOTAL) BY MOUTH DAILY. 28 tablet 11  . omeprazole (PRILOSEC) 20 MG capsule Take 20 mg by mouth daily.     No current facility-administered medications on file prior to visit.     PAST MEDICAL HISTORY: Past Medical History:  Diagnosis Date  . Anemia   . Arthritis of left knee   . Astigmatism   . Diabetes mellitus without complication ([place])   . Dry mouth   . Fatigue   . GERD (gastroesophageal reflux disease)   . Heartburn   . High cholesterol   . Hypertension   . Keratoconjunctivitis   . Muscle pain   . Vitamin D deficiency   . Weakness     PAST SURGICAL HISTORY: Past Surgical History:  Procedure Laterality Date  . DILATION AND CURETTAGE OF UTERUS    . ENDOMETRIAL BIOPSY  12/19/2017   normal per pt  . TONSILLECTOMY    . TUBAL LIGATION      SOCIAL HISTORY: Social History   Tobacco Use  . Smoking status:  Former Smoker    Years: 3.00  . Smokeless tobacco: Never Used  Substance Use Topics  . Alcohol use: No  . Drug use: No    FAMILY HISTORY: Family History  Problem Relation Age of Onset  . Diabetes Mother   . Hypertension Mother   . Hyperlipidemia Mother   . Obesity Mother   . Ovarian cancer Maternal Grandmother   . HIV Father        died from complications (IVDU)  . Alcohol abuse Father   . Drug abuse Father     ROS: Review of Systems  Constitutional: Positive for malaise/fatigue. Negative for weight loss.  Gastrointestinal: Negative for nausea and vomiting.  Musculoskeletal:       Negative muscle weakness  Endo/Heme/Allergies:       Negative hypoglycemia    PHYSICAL EXAM: Pt in no acute distress  RECENT LABS AND TESTS: BMET    Component Value Date/Time   NA 138 12/22/2018 1430   K 4.4 12/22/2018 1430   CL 102  12/22/2018 1430   CO2 17 (L) 12/22/2018 1430   GLUCOSE 133 (H) 12/22/2018 1430   GLUCOSE 172 (H) 01/21/2018 0825   BUN 7 12/22/2018 1430   CREATININE 0.58 12/22/2018 1430   CALCIUM 9.2 12/22/2018 1430   GFRNONAA 116 12/22/2018 1430   GFRAA 133 12/22/2018 1430   Lab Results  Component Value Date   HGBA1C 7.3 (H) 12/22/2018   HGBA1C 7.8 12/31/2017   Lab Results  Component Value Date   INSULIN 28.4 (H) 12/22/2018   CBC    Component Value Date/Time   WBC 7.1 12/22/2018 1430   WBC 6.4 01/21/2018 0825   RBC 5.99 (H) 12/22/2018 1430   RBC 5.81 (H) 01/21/2018 0825   HGB 13.3 12/22/2018 1430   HCT 43.4 12/22/2018 1430   PLT 301 12/22/2018 1430   MCV 73 (L) 12/22/2018 1430   MCH 22.2 (L) 12/22/2018 1430   MCH 22.9 (L) 06/25/2011 1240   MCHC 30.6 (L) 12/22/2018 1430   MCHC 31.5 01/21/2018 0825   RDW 15.9 (H) 12/22/2018 1430   LYMPHSABS 2.1 12/22/2018 1430   MONOABS 0.4 01/21/2018 0825   EOSABS 0.1 12/22/2018 1430   BASOSABS 0.1 12/22/2018 1430   Iron/TIBC/Ferritin/ %Sat No results found for: IRON, TIBC, FERRITIN, IRONPCTSAT Lipid Panel      Component Value Date/Time   CHOL 148 12/22/2018 1430   TRIG 61 12/22/2018 1430   HDL 40 12/22/2018 1430   CHOLHDL 3.7 12/22/2018 1430   CHOLHDL 4 01/21/2018 0825   VLDL 14.2 01/21/2018 0825   LDLCALC 96 12/22/2018 1430   Hepatic Function Panel     Component Value Date/Time   PROT 6.8 12/22/2018 1430   ALBUMIN 4.3 12/22/2018 1430   AST 15 12/22/2018 1430   ALT 21 12/22/2018 1430   ALKPHOS 87 12/22/2018 1430   BILITOT 0.3 12/22/2018 1430      Component Value Date/Time   TSH 0.837 12/22/2018 1430   TSH 1.67 01/21/2018 0825      I, Trixie Dredge, am acting as transcriptionist for Ilene Qua, MD   I have reviewed the above documentation for accuracy and completeness, and I agree with the above. - Ilene Qua, MD

## 2019-04-30 ENCOUNTER — Ambulatory Visit (INDEPENDENT_AMBULATORY_CARE_PROVIDER_SITE_OTHER): Payer: 59 | Admitting: Cardiology

## 2019-04-30 ENCOUNTER — Other Ambulatory Visit: Payer: Self-pay

## 2019-04-30 ENCOUNTER — Encounter: Payer: Self-pay | Admitting: Cardiology

## 2019-04-30 VITALS — BP 124/68 | HR 105 | Wt 293.1 lb

## 2019-04-30 DIAGNOSIS — R072 Precordial pain: Secondary | ICD-10-CM | POA: Diagnosis not present

## 2019-04-30 DIAGNOSIS — R079 Chest pain, unspecified: Secondary | ICD-10-CM

## 2019-04-30 DIAGNOSIS — I10 Essential (primary) hypertension: Secondary | ICD-10-CM | POA: Diagnosis not present

## 2019-04-30 DIAGNOSIS — E1165 Type 2 diabetes mellitus with hyperglycemia: Secondary | ICD-10-CM

## 2019-04-30 DIAGNOSIS — E785 Hyperlipidemia, unspecified: Secondary | ICD-10-CM | POA: Diagnosis not present

## 2019-04-30 MED ORDER — ASPIRIN EC 81 MG PO TBEC: 81.0000 mg | DELAYED_RELEASE_TABLET | Freq: Every day | ORAL | 3 refills | Status: DC

## 2019-04-30 MED FILL — ASPIRIN ADULT LOW STRENGTH: 81 | 90 days supply | Qty: 90 | Fill #0

## 2019-04-30 MED FILL — TRULICITY 1.5 MG/0.5 ML PEN: 1.5 | 28 days supply | Qty: 2 | Fill #2

## 2019-04-30 NOTE — Progress Notes (Addendum)
Cardiology Office Note:    Date:  04/30/2019   ID:  [name], DOB 1976/01/20, MRN [id number]  PCP:  [name], NP  Cardiologist:  Jenne Campus, MD  Electrophysiologist:  None   Referring MD: [name], NP   Chief Complaint  Patient presents with  . Chest Pain    Couple of months  43 year old female presents for consultation of chest pain at the request of her primary care provider [name], NP.   History of Present Illness:    Elizabeth [name] is a 43 y.o. female with a hx of HTN, type 2 diabetes, hyperlipidemia, obesity.  She was seen by her primary care provider on 04/20/2019 and noted to have atypical chest pain.  She had an EKG which was normal sinus rhythm without acute changes.  She was referred to cardiology and seen today.  Of note her diabetes is followed by endocrinology. For last few months has been experiencing chest pain.  She described pressure in the chest lasting for up to 5 minutes not related to exercise.  Can happen at anytime she could be sitting and get it she could be driving car and she will get it.  She described a strength of the sensation 3-4 in scale up to 10.  There is no shortness of breath no sweating associated with this sensation.  Past Medical History:  Diagnosis Date  . Anemia   . Arthritis of left knee   . Astigmatism   . Diabetes mellitus without complication ([place])   . Dry mouth   . Fatigue   . GERD (gastroesophageal reflux disease)   . Heartburn   . High cholesterol   . Hypertension   . Keratoconjunctivitis   . Muscle pain   . Vitamin D deficiency   . Weakness     Past Surgical History:  Procedure Laterality Date  . DILATION AND CURETTAGE OF UTERUS    . ENDOMETRIAL BIOPSY  12/19/2017   normal per pt  . TONSILLECTOMY    . TUBAL LIGATION      Current Medications: Current Meds  Medication Sig  . albuterol (PROVENTIL HFA;VENTOLIN HFA) 108 (90 Base) MCG/ACT inhaler Inhale into the lungs.  [name]  amLODipine (NORVASC) 10 MG tablet TAKE 1 TABLET (10 MG TOTAL) BY MOUTH DAILY.  . Dapagliflozin-metFORMIN HCl ER 02-999 MG TB24 Take by mouth.  . Dulaglutide 1.5 MG/0.5ML SOPN Inject into the skin.  [name] etonogestrel (NEXPLANON) 68 MG IMPL implant 1 each by Subdermal route once.  [name] glipiZIDE (GLUCOTROL) 10 MG tablet Take 1 tablet (10 mg total) by mouth daily before breakfast.  . lisinopril (ZESTRIL) 40 MG tablet Take 1 tablet (40 mg total) by mouth daily.  [name] lovastatin (MEVACOR) 40 MG tablet Take 1 tablet by mouth at bedtime.  . meloxicam (MOBIC) 7.5 MG tablet Take 7.5 mg by mouth daily.  . norethindrone (MICRONOR) 0.35 MG tablet TAKE 1 TABLET (0.35 MG TOTAL) BY MOUTH DAILY.  [name] Vitamin D, Ergocalciferol, (DRISDOL) 1.25 MG (50000 UT) CAPS capsule Take 1 capsule (50,000 Units total) by mouth every 7 (seven) days.     Allergies:   Patient has no known allergies.   Social History   Socioeconomic History  . Marital status: Married    Spouse name: Elizabeth [name]  . Number of children: 2  . Years of education: Not on file  . Highest education level: Not on file  Occupational History  . Occupation: [place]: [place]  .  Financial resource strain: Not on file  . Food insecurity    Worry: Not on file    Inability: Not on file  . Transportation needs    Medical: Not on file    Non-medical: Not on file  Tobacco Use  . Smoking status: Former Smoker    Years: 3.00  . Smokeless tobacco: Never Used  Substance and Sexual Activity  . Alcohol use: No  . Drug use: No  . Sexual activity: Yes    Partners: Male    Birth control/protection: Surgical, Implant  Lifestyle  . Physical activity    Days per week: Not on file    Minutes per session: Not on file  . Stress: Not on file  Relationships  . Social Herbalist on phone: Not on file    Gets together: Not on file    Attends religious service: Not on file    Active member of club or organization:  Not on file    Attends meetings of clubs or organizations: Not on file    Relationship status: Not on file  Other Topics Concern  . 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  ROS:   Please see the history of present illness.     All other systems reviewed and are negative.  EKGs/Labs/Other Studies Reviewed:    The following studies were reviewed today:   EKG:  EKG is  ordered today.  The ekg ordered today demonstrates   Recent Labs: 12/22/2018: ALT 21; BUN 7; Creatinine, Ser 0.58; Hemoglobin 13.3; Platelets 301; Potassium 4.4; Sodium 138; TSH 0.837  Recent Lipid Panel    Component Value Date/Time   CHOL 148 12/22/2018 1430   TRIG 61 12/22/2018 1430   HDL 40 12/22/2018 1430   CHOLHDL 3.7 12/22/2018 1430   CHOLHDL 4 01/21/2018 0825   VLDL 14.2 01/21/2018 0825   LDLCALC 96 12/22/2018 1430    Physical Exam:    VS:  BP 124/68   Pulse (!) 105   Wt 293 lb 1.9 oz (133 kg)   SpO2 98%   BMI 52.76 kg/m     Wt Readings from Last 3 Encounters:  04/30/19 293 lb 1.9 oz (133 kg)  04/20/19 293 lb (132.9 kg)  01/05/19 293 lb (132.9 kg)     GEN:  Well nourished, well developed in no acute distress HEENT: Normal NECK: No JVD; No carotid bruits LYMPHATICS: No lymphadenopathy CARDIAC:RRR, no murmurs, rubs, gallops RESPIRATORY:  Clear to auscultation without rales, wheezing or rhonchi  ABDOMEN: Soft, non-tender, non-distended MUSCULOSKELETAL:  No edema; No deformity  SKIN: Warm and dry NEUROLOGIC:  Alert and oriented x 3 PSYCHIATRIC:  Normal affect   ASSESSMENT:    1. Chest  pain in adult   2. Essential hypertension   3. Type 2 diabetes mellitus with hyperglycemia, without long-term current use of insulin ([place])   4. Hyperlipidemia, unspecified hyperlipidemia type   5. Precordial pain    PLAN:    In order of problems listed above:  1. Chest pain in adult -she does have  some risk factors for coronary artery disease however the pain is best described as atypical.  I will ask her to start taking one baby aspirin every single day.  We will schedule her to have 2 days protocol nuclear stress test. 2. HTN -controlled on appropriate medications which I will continue 3. DM2 -controlled with better hemoglobin A1c.  She started exercise program and weight watchers program.  She lost over the 17th months and she is feeling much better and her diabetes is better controlled. 4. HLD -on statin will recheck her fasting lipid profile after she reached her target weight. 5. Obesity -not that she is doing exercises as well as diet with good results.  I congratulated her for it and strongly encouraged her to continue.   Medication Adjustments/Labs and Tests Ordered: Current medicines are reviewed at length with the patient today.  Concerns regarding medicines are outlined above.  Orders Placed This Encounter  Procedures  . MYOCARDIAL PERFUSION IMAGING   Meds ordered this encounter  Medications  . aspirin EC 81 MG tablet    Sig: Take 1 tablet (81 mg total) by mouth daily.    Dispense:  90 tablet    Refill:  3    Patient Instructions  Medication Instructions:  START:  baby aspirin (81mg ) 1 daily If you need a refill on your cardiac medications before your next appointment, please call your pharmacy.   Lab work: None If you have labs (blood work) drawn today and your tests are completely normal, you will receive your results only by: [name] MyChart Message (if you have MyChart) OR . A paper copy in the mail If you have any lab test that is abnormal or we need to change your  treatment, we will call you to review the results.  Testing/Procedures: Your physician has requested that you have a lexiscan myoview. For further information please visit [email or web address]. Please follow instruction sheet, as given.   Follow-Up: At [facility], you and your health needs are our priority.  As part of our continuing mission to provide you with exceptional heart care, we have created designated Provider Care Teams.  These Care Teams include your primary Cardiologist (physician) and Advanced Practice Providers (APPs -  Physician Assistants and Nurse Practitioners) who all work together to provide you with the care you need, when you need it. . You will need a follow up appointment in 1 months.   Any Other Special Instructions Will Be Listed Below (If Applicable).None   Cardiac Nuclear Scan A cardiac nuclear scan is a test that measures blood flow to the heart when a person is resting and when he or she is exercising. The test looks for problems such as:  Not enough blood reaching a portion of the heart.  The heart muscle not working normally. You may need this test if:  You have heart disease.  You have had abnormal lab results.  You have had heart surgery or a balloon procedure to open up blocked arteries (angioplasty).  You have chest pain.  You have shortness of breath. In this test, a radioactive dye (tracer) is injected into your bloodstream. After the tracer has traveled to your heart, an imaging device is used to measure how much of the tracer is absorbed by or distributed to various areas of your heart. This procedure is usually done at a hospital and takes 2-4 hours. Tell a health care provider about:  Any allergies you have.  All medicines you are taking, including vitamins, herbs,  eye drops, creams, and over-the-counter medicines.  Any problems you or family members have had with anesthetic medicines.  Any blood disorders you have.  Any surgeries  you have had.  Any medical conditions you have.  Whether you are pregnant or may be pregnant. What are the risks? Generally, this is a safe procedure. However, problems may occur, including:  Serious chest pain and heart attack. This is only a risk if the stress portion of the test is done.  Rapid heartbeat.  Sensation of warmth in your chest. This usually passes quickly.  Allergic reaction to the tracer. What happens before the procedure?  Ask your health care provider about changing or stopping your regular medicines. This is especially important if you are taking diabetes medicines or blood thinners.  Follow instructions from your health care provider about eating or drinking restrictions.  Remove your jewelry on the day of the procedure. What happens during the procedure?  An IV will be inserted into one of your veins.  Your health care provider will inject a small amount of radioactive tracer through the IV.  You will wait for 20-40 minutes while the tracer travels through your bloodstream.  Your heart activity will be monitored with an electrocardiogram (ECG).  You will lie down on an exam table.  Images of your heart will be taken for about 15-20 minutes.  You may also have a stress test. For this test, one of the following may be done: ? You will exercise on a treadmill or stationary bike. While you exercise, your heart's activity will be monitored with an ECG, and your blood pressure will be checked. ? You will be given medicines that will increase blood flow to parts of your heart. This is done if you are unable to exercise.  When blood flow to your heart has peaked, a tracer will again be injected through the IV.  After 20-40 minutes, you will get back on the exam table and have more images taken of your heart.  Depending on the type of tracer used, scans may need to be repeated 3-4 hours later.  Your IV line will be removed when the procedure is over. The  procedure may vary among health care providers and hospitals. What happens after the procedure?  Unless your health care provider tells you otherwise, you may return to your normal schedule, including diet, activities, and medicines.  Unless your health care provider tells you otherwise, you may increase your fluid intake. This will help to flush the contrast dye from your body. Drink enough fluid to keep your urine pale yellow.  Ask your health care provider, or the department that is doing the test: ? When will my results be ready? ? How will I get my results? Summary  A cardiac nuclear scan measures the blood flow to the heart when a person is resting and when he or she is exercising.  Tell your health care provider if you are pregnant.  Before the procedure, ask your health care provider about changing or stopping your regular medicines. This is especially important if you are taking diabetes medicines or blood thinners.  After the procedure, unless your health care provider tells you otherwise, increase your fluid intake. This will help flush the contrast dye from your body.  After the procedure, unless your health care provider tells you otherwise, you may return to your normal schedule, including diet, activities, and medicines. This information is not intended to replace advice given to you by your  health care provider. Make sure you discuss any questions you have with your health care provider. Document Released: 11/01/2004 Document Revised: 03/23/2018 Document Reviewed: 03/23/2018 Elsevier Patient Education  2020 [place].  Cardiac Nuclear Scan A cardiac nuclear scan is a test that measures blood flow to the heart when a person is resting and when he or she is exercising. The test looks for problems such as:  Not enough blood reaching a portion of the heart.  The heart muscle not working normally. You may need this test if:  You have heart disease.  You have had  abnormal lab results.  You have had heart surgery or a balloon procedure to open up blocked arteries (angioplasty).  You have chest pain.  You have shortness of breath. In this test, a radioactive dye (tracer) is injected into your bloodstream. After the tracer has traveled to your heart, an imaging device is used to measure how much of the tracer is absorbed by or distributed to various areas of your heart. This procedure is usually done at a hospital and takes 2-4 hours. Tell a health care provider about:  Any allergies you have.  All medicines you are taking, including vitamins, herbs, eye drops, creams, and over-the-counter medicines.  Any problems you or family members have had with anesthetic medicines.  Any blood disorders you have.  Any surgeries you have had.  Any medical conditions you have.  Whether you are pregnant or may be pregnant. What are the risks? Generally, this is a safe procedure. However, problems may occur, including:  Serious chest pain and heart attack. This is only a risk if the stress portion of the test is done.  Rapid heartbeat.  Sensation of warmth in your chest. This usually passes quickly.  Allergic reaction to the tracer. What happens before the procedure?  Ask your health care provider about changing or stopping your regular medicines. This is especially important if you are taking diabetes medicines or blood thinners.  Follow instructions from your health care provider about eating or drinking restrictions.  Remove your jewelry on the day of the procedure. What happens during the procedure?  An IV will be inserted into one of your veins.  Your health care provider will inject a small amount of radioactive tracer through the IV.  You will wait for 20-40 minutes while the tracer travels through your bloodstream.  Your heart activity will be monitored with an electrocardiogram (ECG).  You will lie down on an exam table.  Images of  your heart will be taken for about 15-20 minutes.  You may also have a stress test. For this test, one of the following may be done: ? You will exercise on a treadmill or stationary bike. While you exercise, your heart's activity will be monitored with an ECG, and your blood pressure will be checked. ? You will be given medicines that will increase blood flow to parts of your heart. This is done if you are unable to exercise.  When blood flow to your heart has peaked, a tracer will again be injected through the IV.  After 20-40 minutes, you will get back on the exam table and have more images taken of your heart.  Depending on the type of tracer used, scans may need to be repeated 3-4 hours later.  Your IV line will be removed when the procedure is over. The procedure may vary among health care providers and hospitals. What happens after the procedure?  Unless your health care  provider tells you otherwise, you may return to your normal schedule, including diet, activities, and medicines.  Unless your health care provider tells you otherwise, you may increase your fluid intake. This will help to flush the contrast dye from your body. Drink enough fluid to keep your urine pale yellow.  Ask your health care provider, or the department that is doing the test: ? When will my results be ready? ? How will I get my results? Summary  A cardiac nuclear scan measures the blood flow to the heart when a person is resting and when he or she is exercising.  Tell your health care provider if you are pregnant.  Before the procedure, ask your health care provider about changing or stopping your regular medicines. This is especially important if you are taking diabetes medicines or blood thinners.  After the procedure, unless your health care provider tells you otherwise, increase your fluid intake. This will help flush the contrast dye from your body.  After the procedure, unless your health care  provider tells you otherwise, you may return to your normal schedule, including diet, activities, and medicines. This information is not intended to replace advice given to you by your health care provider. Make sure you discuss any questions you have with your health care provider. Document Released: 11/01/2004 Document Revised: 03/23/2018 Document Reviewed: 03/23/2018 Elsevier Patient Education  2020 [place], Dr. Agustin Cree  04/30/2019 3:18 PM    [place]

## 2019-04-30 NOTE — Patient Instructions (Addendum)
Medication Instructions:  START:  baby aspirin (81mg ) 1 daily If you need a refill on your cardiac medications before your next appointment, please call your pharmacy.   Lab work: None If you have labs (blood work) drawn today and your tests are completely normal, you will receive your results only by: [name] MyChart Message (if you have MyChart) OR . A paper copy in the mail If you have any lab test that is abnormal or we need to change your treatment, we will call you to review the results.  Testing/Procedures: Your physician has requested that you have a lexiscan myoview. For further information please visit [email or web address]. Please follow instruction sheet, as given.   Follow-Up: At [facility], you and your health needs are our priority.  As part of our continuing mission to provide you with exceptional heart care, we have created designated Provider Care Teams.  These Care Teams include your primary Cardiologist (physician) and Advanced Practice Providers (APPs -  Physician Assistants and Nurse Practitioners) who all work together to provide you with the care you need, when you need it. . You will need a follow up appointment in 1 months.   Any Other Special Instructions Will Be Listed Below (If Applicable).None   Cardiac Nuclear Scan A cardiac nuclear scan is a test that measures blood flow to the heart when a person is resting and when he or she is exercising. The test looks for problems such as:  Not enough blood reaching a portion of the heart.  The heart muscle not working normally. You may need this test if:  You have heart disease.  You have had abnormal lab results.  You have had heart surgery or a balloon procedure to open up blocked arteries (angioplasty).  You have chest pain.  You have shortness of breath. In this test, a radioactive dye (tracer) is injected into your bloodstream. After the tracer has traveled to your heart, an imaging device is used to  measure how much of the tracer is absorbed by or distributed to various areas of your heart. This procedure is usually done at a hospital and takes 2-4 hours. Tell a health care provider about:  Any allergies you have.  All medicines you are taking, including vitamins, herbs, eye drops, creams, and over-the-counter medicines.  Any problems you or family members have had with anesthetic medicines.  Any blood disorders you have.  Any surgeries you have had.  Any medical conditions you have.  Whether you are pregnant or may be pregnant. What are the risks? Generally, this is a safe procedure. However, problems may occur, including:  Serious chest pain and heart attack. This is only a risk if the stress portion of the test is done.  Rapid heartbeat.  Sensation of warmth in your chest. This usually passes quickly.  Allergic reaction to the tracer. What happens before the procedure?  Ask your health care provider about changing or stopping your regular medicines. This is especially important if you are taking diabetes medicines or blood thinners.  Follow instructions from your health care provider about eating or drinking restrictions.  Remove your jewelry on the day of the procedure. What happens during the procedure?  An IV will be inserted into one of your veins.  Your health care provider will inject a small amount of radioactive tracer through the IV.  You will wait for 20-40 minutes while the tracer travels through your bloodstream.  Your heart activity will be monitored with an electrocardiogram (ECG).  You will lie down on an exam table.  Images of your heart will be taken for about 15-20 minutes.  You may also have a stress test. For this test, one of the following may be done: ? You will exercise on a treadmill or stationary bike. While you exercise, your heart's activity will be monitored with an ECG, and your blood pressure will be checked. ? You will be given  medicines that will increase blood flow to parts of your heart. This is done if you are unable to exercise.  When blood flow to your heart has peaked, a tracer will again be injected through the IV.  After 20-40 minutes, you will get back on the exam table and have more images taken of your heart.  Depending on the type of tracer used, scans may need to be repeated 3-4 hours later.  Your IV line will be removed when the procedure is over. The procedure may vary among health care providers and hospitals. What happens after the procedure?  Unless your health care provider tells you otherwise, you may return to your normal schedule, including diet, activities, and medicines.  Unless your health care provider tells you otherwise, you may increase your fluid intake. This will help to flush the contrast dye from your body. Drink enough fluid to keep your urine pale yellow.  Ask your health care provider, or the department that is doing the test: ? When will my results be ready? ? How will I get my results? Summary  A cardiac nuclear scan measures the blood flow to the heart when a person is resting and when he or she is exercising.  Tell your health care provider if you are pregnant.  Before the procedure, ask your health care provider about changing or stopping your regular medicines. This is especially important if you are taking diabetes medicines or blood thinners.  After the procedure, unless your health care provider tells you otherwise, increase your fluid intake. This will help flush the contrast dye from your body.  After the procedure, unless your health care provider tells you otherwise, you may return to your normal schedule, including diet, activities, and medicines. This information is not intended to replace advice given to you by your health care provider. Make sure you discuss any questions you have with your health care provider. Document Released: 11/01/2004 Document  Revised: 03/23/2018 Document Reviewed: 03/23/2018 Elsevier Patient Education  2020 [employer].  Cardiac Nuclear Scan A cardiac nuclear scan is a test that measures blood flow to the heart when a person is resting and when he or she is exercising. The test looks for problems such as:  Not enough blood reaching a portion of the heart.  The heart muscle not working normally. You may need this test if:  You have heart disease.  You have had abnormal lab results.  You have had heart surgery or a balloon procedure to open up blocked arteries (angioplasty).  You have chest pain.  You have shortness of breath. In this test, a radioactive dye (tracer) is injected into your bloodstream. After the tracer has traveled to your heart, an imaging device is used to measure how much of the tracer is absorbed by or distributed to various areas of your heart. This procedure is usually done at a hospital and takes 2-4 hours. Tell a health care provider about:  Any allergies you have.  All medicines you are taking, including vitamins, herbs, eye drops, creams, and over-the-counter medicines.  Any problems you or family members have had with anesthetic medicines.  Any blood disorders you have.  Any surgeries you have had.  Any medical conditions you have.  Whether you are pregnant or may be pregnant. What are the risks? Generally, this is a safe procedure. However, problems may occur, including:  Serious chest pain and heart attack. This is only a risk if the stress portion of the test is done.  Rapid heartbeat.  Sensation of warmth in your chest. This usually passes quickly.  Allergic reaction to the tracer. What happens before the procedure?  Ask your health care provider about changing or stopping your regular medicines. This is especially important if you are taking diabetes medicines or blood thinners.  Follow instructions from your health care provider about eating or drinking  restrictions.  Remove your jewelry on the day of the procedure. What happens during the procedure?  An IV will be inserted into one of your veins.  Your health care provider will inject a small amount of radioactive tracer through the IV.  You will wait for 20-40 minutes while the tracer travels through your bloodstream.  Your heart activity will be monitored with an electrocardiogram (ECG).  You will lie down on an exam table.  Images of your heart will be taken for about 15-20 minutes.  You may also have a stress test. For this test, one of the following may be done: ? You will exercise on a treadmill or stationary bike. While you exercise, your heart's activity will be monitored with an ECG, and your blood pressure will be checked. ? You will be given medicines that will increase blood flow to parts of your heart. This is done if you are unable to exercise.  When blood flow to your heart has peaked, a tracer will again be injected through the IV.  After 20-40 minutes, you will get back on the exam table and have more images taken of your heart.  Depending on the type of tracer used, scans may need to be repeated 3-4 hours later.  Your IV line will be removed when the procedure is over. The procedure may vary among health care providers and hospitals. What happens after the procedure?  Unless your health care provider tells you otherwise, you may return to your normal schedule, including diet, activities, and medicines.  Unless your health care provider tells you otherwise, you may increase your fluid intake. This will help to flush the contrast dye from your body. Drink enough fluid to keep your urine pale yellow.  Ask your health care provider, or the department that is doing the test: ? When will my results be ready? ? How will I get my results? Summary  A cardiac nuclear scan measures the blood flow to the heart when a person is resting and when he or she is  exercising.  Tell your health care provider if you are pregnant.  Before the procedure, ask your health care provider about changing or stopping your regular medicines. This is especially important if you are taking diabetes medicines or blood thinners.  After the procedure, unless your health care provider tells you otherwise, increase your fluid intake. This will help flush the contrast dye from your body.  After the procedure, unless your health care provider tells you otherwise, you may return to your normal schedule, including diet, activities, and medicines. This information is not intended to replace advice given to you by your health care provider. Make sure you discuss  any questions you have with your health care provider. Document Released: 11/01/2004 Document Revised: 03/23/2018 Document Reviewed: 03/23/2018 Elsevier Patient Education  2020 [employer].

## 2019-04-30 NOTE — Addendum Note (Signed)
Addended by: Loel Dubonnet on: 04/30/2019 03:18 PM   Modules accepted: Orders

## 2019-04-30 NOTE — Addendum Note (Signed)
Addended by: Polly Cobia A on: 04/30/2019 03:10 PM   Modules accepted: Orders

## 2019-05-03 ENCOUNTER — Encounter: Payer: Self-pay | Admitting: Family

## 2019-05-03 ENCOUNTER — Telehealth (HOSPITAL_COMMUNITY): Payer: Self-pay | Admitting: *Deleted

## 2019-05-03 MED ORDER — LOVASTATIN 40 MG PO TABS: 40.0000 mg | ORAL_TABLET | Freq: Every day | ORAL | 1 refills | Status: DC

## 2019-05-03 MED FILL — LOVASTATIN 40 MG TABS: 40 | 90 days supply | Qty: 90 | Fill #0

## 2019-05-03 NOTE — Telephone Encounter (Signed)
Left message on voicemail per DPR in reference to upcoming appointment scheduled on 05/04/19 with detailed instructions given per Myocardial Perfusion Study Information Sheet for the test. LM to arrive 15 minutes early, and that it is imperative to arrive on time for appointment to keep from having the test rescheduled. If you need to cancel or reschedule your appointment, please call the office within 24 hours of your appointment. Failure to do so may result in a cancellation of your appointment, and a $50 no show fee. Phone number given for call back for any questions. Kirstie Peri

## 2019-05-04 ENCOUNTER — Ambulatory Visit (HOSPITAL_COMMUNITY): Payer: 59 | Attending: Cardiology

## 2019-05-04 ENCOUNTER — Encounter (HOSPITAL_COMMUNITY): Payer: Self-pay

## 2019-05-04 ENCOUNTER — Other Ambulatory Visit: Payer: Self-pay

## 2019-05-04 DIAGNOSIS — R072 Precordial pain: Secondary | ICD-10-CM | POA: Diagnosis not present

## 2019-05-04 LAB — MYOCARDIAL PERFUSION IMAGING
LV dias vol: 67 mL (ref 46–106)
LV sys vol: 31 mL
Peak HR: 136 {beats}/min
Rest HR: 89 {beats}/min
SDS: 0
SRS: 0
SSS: 0
TID: 0.87

## 2019-05-05 ENCOUNTER — Telehealth: Payer: Self-pay | Admitting: *Deleted

## 2019-05-05 NOTE — Telephone Encounter (Signed)
Telephone call to patient. Left message to call back regarding stress test.

## 2019-05-05 NOTE — Telephone Encounter (Signed)
-----   Message from Loel Dubonnet, NP sent at 05/05/2019  8:25 AM EDT ----- Stress test was a low risk study. She has been scheduled for follow up with Dr. Agustin Cree in [place] on 06/14/19 at 1:55PM.

## 2019-05-06 ENCOUNTER — Telehealth (INDEPENDENT_AMBULATORY_CARE_PROVIDER_SITE_OTHER): Payer: 59 | Admitting: Family Medicine

## 2019-05-06 ENCOUNTER — Encounter (INDEPENDENT_AMBULATORY_CARE_PROVIDER_SITE_OTHER): Payer: Self-pay | Admitting: Family Medicine

## 2019-05-06 DIAGNOSIS — E559 Vitamin D deficiency, unspecified: Secondary | ICD-10-CM | POA: Diagnosis not present

## 2019-05-06 DIAGNOSIS — E1165 Type 2 diabetes mellitus with hyperglycemia: Secondary | ICD-10-CM

## 2019-05-06 DIAGNOSIS — Z6841 Body Mass Index (BMI) 40.0 and over, adult: Secondary | ICD-10-CM

## 2019-05-06 MED ORDER — CONTINUOUS BLOOD GLUC SENSOR MISC: 1.0000 | 0 refills | Status: DC

## 2019-05-10 MED FILL — AMLODIPINE BESYLATE 10 MG T: 10 | 90 days supply | Qty: 90 | Fill #3

## 2019-05-10 NOTE — Progress Notes (Signed)
Office: [phone]  /  Fax: [phone] TeleHealth Visit:  Elizabeth [name] has verbally consented to this TeleHealth visit today. The patient is located at work, the provider is located at the [employer] [facility] office. The participants in this visit include the listed provider and patient. The visit was conducted today via Webex.  HPI:   Chief Complaint: OBESITY Elizabeth [name] is here to discuss her progress with her obesity treatment plan. She is on the  follow the Category 3 plan and 450-600 calories and 40+g of protein for supper and is following her eating plan approximately 75-80 % of the time. She states she is exercising by walking for 10 minutes 2-3 times per week. Elizabeth [name] is following meal plan has gotten better and more on track. She has used some recipes and eating out options. She has had fluctuating feeling blood glucose.  We were unable to weigh the patient today for this TeleHealth visit. She feels as if she has maintained weight since her last visit. She has lost 5 lbs since starting treatment with [name].  Diabetes II Elizabeth [name] has a diagnosis of diabetes type II. Elizabeth [name] states she is out of glucose monitor strips and they are suppose to come back in mail today or tomorrow. She admits to 1 hypoglycemic episode and admits to cravings for chocolate and ice cream (significant glucose) . Last A1c was 7.3.  She has been working on intensive lifestyle modifications including diet, exercise, and weight loss to help control her blood glucose levels.  Vitamin D deficiency Elizabeth [name] has a diagnosis of vitamin D deficiency. She is currently taking vit D and denies nausea, vomiting or muscle weakness. She reports fatigue.   ASSESSMENT AND PLAN:  Type 2 diabetes mellitus with hyperglycemia, without long-term current use of insulin (Elizabeth [name]) - Plan: Continuous Blood Gluc Sensor MISC  Vitamin D deficiency  Class 3 severe obesity with serious comorbidity and body mass index (BMI) of 50.0 to  59.9 in adult, unspecified obesity type ([name])  PLAN: Diabetes II Elizabeth [name] has been given extensive diabetes education by myself today including ideal fasting and post-prandial blood glucose readings, individual ideal HgA1c goals  and hypoglycemia prevention. We discussed the importance of good blood sugar control to decrease the likelihood of diabetic complications such as nephropathy, neuropathy, limb loss, blindness, coronary artery disease, and death. We discussed the importance of intensive lifestyle modification including diet, exercise and weight loss as the first line treatment for diabetes. Elizabeth [name] agrees to continue her diabetes medications and start checking blood glucose, we sent in continuous blood glucose monitoring and will follow up at the agreed upon time.  Vitamin D Deficiency Elizabeth [name] was informed that low vitamin D levels contributes to fatigue and are associated with obesity, breast, and colon cancer. She agrees to continue to take prescription Vit D @50 ,000 IU every week and will follow up for routine testing of vitamin D, at least 2-3 times per year. She was informed of the risk of over-replacement of vitamin D and agrees to not increase her dose unless she discusses this with [name] first.  Obesity Elizabeth [name] is currently in the action stage of change. As such, her goal is to continue with weight loss efforts She has agreed to keep a food journal with 450-600 calories and 40+g of protein for supper and follow the Category 3 plan Elizabeth [name] has been instructed to work up to a goal of 150 minutes of combined cardio and strengthening exercise per week for weight loss and overall health benefits. We  discussed the following Behavioral Modification Stratagies today: planning for success, better snacking choices, increasing lean protein intake, increasing vegetables, work on meal planning and easy cooking plans and ways to avoid boredom eating   Elizabeth [name] has agreed to follow up with our clinic in  2 weeks. She was informed of the importance of frequent follow up visits to maximize her success with intensive lifestyle modifications for her multiple health conditions.  ALLERGIES: No Known Allergies  MEDICATIONS: Current Outpatient Medications on File Prior to Visit  Medication Sig Dispense Refill  . amLODipine (NORVASC) 10 MG tablet TAKE 1 TABLET (10 MG TOTAL) BY MOUTH DAILY. 30 tablet 5  . aspirin EC 81 MG tablet Take 1 tablet (81 mg total) by mouth daily. 90 tablet 3  . Dapagliflozin-metFORMIN HCl ER 02-999 MG TB24 Take by mouth.    . Dulaglutide 1.5 MG/0.5ML SOPN Inject into the skin.    [name] etonogestrel (NEXPLANON) 68 MG IMPL implant 1 each by Subdermal route once.    [name] glipiZIDE (GLUCOTROL) 10 MG tablet Take 1 tablet (10 mg total) by mouth daily before breakfast. 30 tablet 0  . lisinopril (ZESTRIL) 40 MG tablet Take 1 tablet (40 mg total) by mouth daily. 90 tablet 1  . lovastatin (MEVACOR) 40 MG tablet Take 1 tablet (40 mg total) by mouth at bedtime. 90 tablet 1  . meloxicam (MOBIC) 7.5 MG tablet Take 7.5 mg by mouth daily.    . Vitamin D, Ergocalciferol, (DRISDOL) 1.25 MG (50000 UT) CAPS capsule Take 1 capsule (50,000 Units total) by mouth every 7 (seven) days. 4 capsule 0  . albuterol (PROVENTIL HFA;VENTOLIN HFA) 108 (90 Base) MCG/ACT inhaler Inhale into the lungs.    . norethindrone (MICRONOR) 0.35 MG tablet TAKE 1 TABLET (0.35 MG TOTAL) BY MOUTH DAILY. 28 tablet 11   No current facility-administered medications on file prior to visit.     PAST MEDICAL HISTORY: Past Medical History:  Diagnosis Date  . Anemia   . Arthritis of left knee   . Astigmatism   . Diabetes mellitus without complication ([place])   . Dry mouth   . Fatigue   . GERD (gastroesophageal reflux disease)   . Heartburn   . High cholesterol   . Hypertension   . Keratoconjunctivitis   . Muscle pain   . Vitamin D deficiency   . Weakness     PAST SURGICAL HISTORY: Past Surgical History:  Procedure  Laterality Date  . DILATION AND CURETTAGE OF UTERUS    . ENDOMETRIAL BIOPSY  12/19/2017   normal per pt  . TONSILLECTOMY    . TUBAL LIGATION      SOCIAL HISTORY: Social History   Tobacco Use  . Smoking status: Former Smoker    Years: 3.00  . Smokeless tobacco: Never Used  Substance Use Topics  . Alcohol use: No  . Drug use: No    FAMILY HISTORY: Family History  Problem Relation Age of Onset  . Diabetes Mother   . Hypertension Mother   . Hyperlipidemia Mother   . Obesity Mother   . Ovarian cancer Maternal Grandmother   . HIV Father        died from complications (IVDU)  . Alcohol abuse Father   . Drug abuse Father     ROS: Review of Systems  Constitutional: Positive for malaise/fatigue.  Gastrointestinal: Negative for nausea and vomiting.  Musculoskeletal:       Negative for muscle weakness  Endo/Heme/Allergies:       Negative for hypoglycemia  PHYSICAL EXAM: Pt in no acute distress  RECENT LABS AND TESTS: BMET    Component Value Date/Time   NA 138 12/22/2018 1430   K 4.4 12/22/2018 1430   CL 102 12/22/2018 1430   CO2 17 (L) 12/22/2018 1430   GLUCOSE 133 (H) 12/22/2018 1430   GLUCOSE 172 (H) 01/21/2018 0825   BUN 7 12/22/2018 1430   CREATININE 0.58 12/22/2018 1430   CALCIUM 9.2 12/22/2018 1430   GFRNONAA 116 12/22/2018 1430   GFRAA 133 12/22/2018 1430   Lab Results  Component Value Date   HGBA1C 7.3 (H) 12/22/2018   HGBA1C 7.8 12/31/2017   Lab Results  Component Value Date   INSULIN 28.4 (H) 12/22/2018   CBC    Component Value Date/Time   WBC 7.1 12/22/2018 1430   WBC 6.4 01/21/2018 0825   RBC 5.99 (H) 12/22/2018 1430   RBC 5.81 (H) 01/21/2018 0825   HGB 13.3 12/22/2018 1430   HCT 43.4 12/22/2018 1430   PLT 301 12/22/2018 1430   MCV 73 (L) 12/22/2018 1430   MCH 22.2 (L) 12/22/2018 1430   MCH 22.9 (L) 06/25/2011 1240   MCHC 30.6 (L) 12/22/2018 1430   MCHC 31.5 01/21/2018 0825   RDW 15.9 (H) 12/22/2018 1430   LYMPHSABS 2.1  12/22/2018 1430   MONOABS 0.4 01/21/2018 0825   EOSABS 0.1 12/22/2018 1430   BASOSABS 0.1 12/22/2018 1430   Iron/TIBC/Ferritin/ %Sat No results found for: IRON, TIBC, FERRITIN, IRONPCTSAT Lipid Panel     Component Value Date/Time   CHOL 148 12/22/2018 1430   TRIG 61 12/22/2018 1430   HDL 40 12/22/2018 1430   CHOLHDL 3.7 12/22/2018 1430   CHOLHDL 4 01/21/2018 0825   VLDL 14.2 01/21/2018 0825   LDLCALC 96 12/22/2018 1430   Hepatic Function Panel     Component Value Date/Time   PROT 6.8 12/22/2018 1430   ALBUMIN 4.3 12/22/2018 1430   AST 15 12/22/2018 1430   ALT 21 12/22/2018 1430   ALKPHOS 87 12/22/2018 1430   BILITOT 0.3 12/22/2018 1430      Component Value Date/Time   TSH 0.837 12/22/2018 1430   TSH 1.67 01/21/2018 0825      I, Renee Ramus, am acting as Location manager for Ilene Qua, MD    I have reviewed the above documentation for accuracy and completeness, and I agree with the above. - Ilene Qua, MD

## 2019-05-12 ENCOUNTER — Encounter (INDEPENDENT_AMBULATORY_CARE_PROVIDER_SITE_OTHER): Payer: Self-pay | Admitting: Family Medicine

## 2019-05-13 ENCOUNTER — Other Ambulatory Visit: Payer: Self-pay | Admitting: Family

## 2019-05-13 MED FILL — LISINOPRIL 40 MG TABLET: 40 | 90 days supply | Qty: 90 | Fill #1

## 2019-05-13 MED FILL — LOVASTATIN 40 MG TABS: 40 | 90 days supply | Qty: 90 | Fill #0

## 2019-05-14 MED FILL — glipiZIDE 10 MG TABS: 10 | 30 days supply | Qty: 60 | Fill #0

## 2019-05-14 MED FILL — FREESTYLE LIBRE 14 DAY SENS: 28 days supply | Qty: 2 | Fill #0

## 2019-05-14 MED FILL — FREESTYLE LIBRE 14 DAY READ: 30 days supply | Qty: 1 | Fill #0

## 2019-05-14 MED FILL — AMLODIPINE BESYLATE 10 MG T: 10 | 90 days supply | Qty: 90 | Fill #0

## 2019-05-14 NOTE — Telephone Encounter (Signed)
Left message on pt's voicemail that PCP sent glimepiride refills to pharmacy.

## 2019-05-14 NOTE — Telephone Encounter (Signed)
Received refill request for glipizide 10mg  twice a day. Current med list states pt is taking medication once a day. Spoke with pt. She verified that weight management told her to cut it back to once a day. Advised her per 04/20/19 note with PCP, she deferred treatment of diabetes to pt's endocrinologist. Pt states she is seeing [name]. Pt states she is out of medication now. Can PCP refill or should request go weight management group since they made recent change to medication?

## 2019-05-17 ENCOUNTER — Encounter (INDEPENDENT_AMBULATORY_CARE_PROVIDER_SITE_OTHER): Payer: Self-pay

## 2019-05-20 ENCOUNTER — Telehealth (INDEPENDENT_AMBULATORY_CARE_PROVIDER_SITE_OTHER): Payer: 59 | Admitting: Family Medicine

## 2019-05-27 ENCOUNTER — Encounter (INDEPENDENT_AMBULATORY_CARE_PROVIDER_SITE_OTHER): Payer: Self-pay | Admitting: Family Medicine

## 2019-05-27 ENCOUNTER — Other Ambulatory Visit: Payer: Self-pay

## 2019-05-27 ENCOUNTER — Telehealth (INDEPENDENT_AMBULATORY_CARE_PROVIDER_SITE_OTHER): Payer: 59 | Admitting: Family Medicine

## 2019-05-27 DIAGNOSIS — E559 Vitamin D deficiency, unspecified: Secondary | ICD-10-CM | POA: Diagnosis not present

## 2019-05-27 DIAGNOSIS — E1165 Type 2 diabetes mellitus with hyperglycemia: Secondary | ICD-10-CM | POA: Diagnosis not present

## 2019-05-27 DIAGNOSIS — Z6841 Body Mass Index (BMI) 40.0 and over, adult: Secondary | ICD-10-CM

## 2019-05-27 MED ORDER — VITAMIN D (ERGOCALCIFEROL) 1.25 MG (50000 UNIT) PO CAPS: 50000.0000 [IU] | ORAL_CAPSULE | ORAL | 0 refills | Status: DC

## 2019-05-31 NOTE — Progress Notes (Signed)
Office: [phone]  /  Fax: [phone] TeleHealth Visit:  Elizabeth [name] has verbally consented to this TeleHealth visit today. The patient is located at work, the provider is located at the [employer] [facility] office. The participants in this visit include the listed provider and patient and any and all parties involved. The visit was conducted today via WebEx.  HPI:   Chief Complaint: OBESITY Elizabeth [name] is here to discuss her progress with her obesity treatment plan. She is on the Category 3 plan and is following her eating plan approximately 70 % of the time. She states she is walking 15 minutes 3 times per week. Elizabeth [name] notes the last few weeks were very busy and she had to pick up extra duties at work; so she did well on breakfast and lunch, but she struggled with dinner. Elizabeth [name] needs to go to the grocery store to pick up food for dinner. We were unable to weigh the patient today for this TeleHealth visit. She feels as if she has maintained weight since her last visit (weight 290 lbs this morning). She has lost 5 lbs since starting treatment with [name].  Vitamin D deficiency Elizabeth [name] has a diagnosis of vitamin D deficiency. She is currently taking vit D. Elizabeth [name] admits to fatigue and she denies nausea, vomiting or muscle weakness.  Diabetes II with hyperglycemia, non insulin Elizabeth [name] has a diagnosis of diabetes type II. Lasya is on Glipizide, Trulicity, Dapagliflozin, Metformin and statin. Her BGs are averaging approximately 160 (low of 86). Elizabeth [name] denies any hypoglycemic episodes. She has been working on intensive lifestyle modifications including diet, exercise, and weight loss to help control her blood glucose levels.  ASSESSMENT AND PLAN:  Type 2 diabetes mellitus with hyperglycemia, without long-term current use of insulin (HCC)  Vitamin D deficiency - Plan: Vitamin D, Ergocalciferol, (DRISDOL) 1.25 MG (50000 UT) CAPS capsule  Class 3 severe obesity with serious  comorbidity and body mass index (BMI) of 50.0 to 59.9 in adult, unspecified obesity type (HCC)  PLAN:  Vitamin D Deficiency Elizabeth [name] was informed that low vitamin D levels contributes to fatigue and are associated with obesity, breast, and colon cancer. She agrees to continue to take prescription Vit D @50 ,000 IU every week #4 with no refills and will follow up for routine testing of vitamin D, at least 2-3 times per year. She was informed of the risk of over-replacement of vitamin D and agrees to not increase her dose unless she discusses this with [name] first. Elizabeth [name] agrees to follow up with our clinic in 2 weeks.  Diabetes II with hyperglycemia, non insulin Elizabeth [name] has been given extensive diabetes education by myself today including ideal fasting and post-prandial blood glucose readings, individual ideal Hgb A1c goals and hypoglycemia prevention. We discussed the importance of good blood sugar control to decrease the likelihood of diabetic complications such as nephropathy, neuropathy, limb loss, blindness, coronary artery disease, and death. We discussed the importance of intensive lifestyle modification including diet, exercise and weight loss as the first line treatment for diabetes. Elizabeth [name] agrees to continue her current diabetes medications and will follow up at the agreed upon time. Elizabeth [name] will need labs at the next in-office visit.  Obesity Elizabeth [name] is currently in the action stage of change. As such, her goal is to continue with weight loss efforts She has agreed to follow the Category 3 plan Elizabeth [name] has been instructed to work up to a goal of 150 minutes of combined cardio and strengthening exercise per week for weight  loss and overall health benefits. We discussed the following Behavioral Modification Strategies today: planning for success, keeping healthy foods in the home, increasing lean protein intake, increasing vegetables and work on meal planning and easy cooking plans  Elizabeth [name] has  agreed to follow up with our clinic in 2 weeks. She was informed of the importance of frequent follow up visits to maximize her success with intensive lifestyle modifications for her multiple health conditions.  ALLERGIES: No Known Allergies  MEDICATIONS: Current Outpatient Medications on File Prior to Visit  Medication Sig Dispense Refill  . amLODipine (NORVASC) 10 MG tablet TAKE 1 TABLET (10 MG TOTAL) BY MOUTH DAILY. 30 tablet 5  . aspirin EC 81 MG tablet Take 1 tablet (81 mg total) by mouth daily. 90 tablet 3  . Continuous Blood Gluc Sensor MISC 1 each by Does not apply route as directed. Use as directed every 14 days. May dispense FreeStyle [employer] or similar. 1 each 0  . Dapagliflozin-metFORMIN HCl ER 02-999 MG TB24 Take by mouth.    . Dulaglutide 1.5 MG/0.5ML SOPN Inject into the skin.    [name] etonogestrel (NEXPLANON) 68 MG IMPL implant 1 each by Subdermal route once.    [name] glipiZIDE (GLUCOTROL) 10 MG tablet TAKE 1 TABLET (10 MG TOTAL) BY MOUTH 2 (TWO) TIMES DAILY BEFORE A MEAL. 60 tablet 5  . lisinopril (ZESTRIL) 40 MG tablet Take 1 tablet (40 mg total) by mouth daily. 90 tablet 1  . lovastatin (MEVACOR) 40 MG tablet Take 1 tablet (40 mg total) by mouth at bedtime. 90 tablet 1  . meloxicam (MOBIC) 7.5 MG tablet Take 7.5 mg by mouth daily.    [name] albuterol (PROVENTIL HFA;VENTOLIN HFA) 108 (90 Base) MCG/ACT inhaler Inhale into the lungs.    . norethindrone (MICRONOR) 0.35 MG tablet TAKE 1 TABLET (0.35 MG TOTAL) BY MOUTH DAILY. 28 tablet 11   No current facility-administered medications on file prior to visit.     PAST MEDICAL HISTORY: Past Medical History:  Diagnosis Date  . Anemia   . Arthritis of left knee   . Astigmatism   . Diabetes mellitus without complication ([place])   . Dry mouth   . Fatigue   . GERD (gastroesophageal reflux disease)   . Heartburn   . High cholesterol   . Hypertension   . Keratoconjunctivitis   . Muscle pain   . Vitamin D deficiency   .  Weakness     PAST SURGICAL HISTORY: Past Surgical History:  Procedure Laterality Date  . DILATION AND CURETTAGE OF UTERUS    . ENDOMETRIAL BIOPSY  12/19/2017   normal per pt  . TONSILLECTOMY    . TUBAL LIGATION      SOCIAL HISTORY: Social History   Tobacco Use  . Smoking status: Former Smoker    Years: 3.00  . Smokeless tobacco: Never Used  Substance Use Topics  . Alcohol use: No  . Drug use: No    FAMILY HISTORY: Family History  Problem Relation Age of Onset  . Diabetes Mother   . Hypertension Mother   . Hyperlipidemia Mother   . Obesity Mother   . Ovarian cancer Maternal Grandmother   . HIV Father        died from complications (IVDU)  . Alcohol abuse Father   . Drug abuse Father     ROS: Review of Systems  Constitutional: Positive for malaise/fatigue. Negative for weight loss.  Gastrointestinal: Negative for nausea and vomiting.  Musculoskeletal:  Negative for muscle weakness  Endo/Heme/Allergies:       Negative for hypoglycemia    PHYSICAL EXAM: Pt in no acute distress  RECENT LABS AND TESTS: BMET    Component Value Date/Time   NA 138 12/22/2018 1430   K 4.4 12/22/2018 1430   CL 102 12/22/2018 1430   CO2 17 (L) 12/22/2018 1430   GLUCOSE 133 (H) 12/22/2018 1430   GLUCOSE 172 (H) 01/21/2018 0825   BUN 7 12/22/2018 1430   CREATININE 0.58 12/22/2018 1430   CALCIUM 9.2 12/22/2018 1430   GFRNONAA 116 12/22/2018 1430   GFRAA 133 12/22/2018 1430   Lab Results  Component Value Date   HGBA1C 7.3 (H) 12/22/2018   HGBA1C 7.8 12/31/2017   Lab Results  Component Value Date   INSULIN 28.4 (H) 12/22/2018   CBC    Component Value Date/Time   WBC 7.1 12/22/2018 1430   WBC 6.4 01/21/2018 0825   RBC 5.99 (H) 12/22/2018 1430   RBC 5.81 (H) 01/21/2018 0825   HGB 13.3 12/22/2018 1430   HCT 43.4 12/22/2018 1430   PLT 301 12/22/2018 1430   MCV 73 (L) 12/22/2018 1430   MCH 22.2 (L) 12/22/2018 1430   MCH 22.9 (L) 06/25/2011 1240   MCHC 30.6 (L)  12/22/2018 1430   MCHC 31.5 01/21/2018 0825   RDW 15.9 (H) 12/22/2018 1430   LYMPHSABS 2.1 12/22/2018 1430   MONOABS 0.4 01/21/2018 0825   EOSABS 0.1 12/22/2018 1430   BASOSABS 0.1 12/22/2018 1430   Iron/TIBC/Ferritin/ %Sat No results found for: IRON, TIBC, FERRITIN, IRONPCTSAT Lipid Panel     Component Value Date/Time   CHOL 148 12/22/2018 1430   TRIG 61 12/22/2018 1430   HDL 40 12/22/2018 1430   CHOLHDL 3.7 12/22/2018 1430   CHOLHDL 4 01/21/2018 0825   VLDL 14.2 01/21/2018 0825   LDLCALC 96 12/22/2018 1430   Hepatic Function Panel     Component Value Date/Time   PROT 6.8 12/22/2018 1430   ALBUMIN 4.3 12/22/2018 1430   AST 15 12/22/2018 1430   ALT 21 12/22/2018 1430   ALKPHOS 87 12/22/2018 1430   BILITOT 0.3 12/22/2018 1430      Component Value Date/Time   TSH 0.837 12/22/2018 1430   TSH 1.67 01/21/2018 0825     Ref. Range 12/22/2018 14:30  Vitamin D, 25-Hydroxy Latest Ref Range: 30.0 - 100.0 ng/mL 19.1 (L)    I, Doreene Nest, am acting as Location manager for Eber Jones, MD  I have reviewed the above documentation for accuracy and completeness, and I agree with the above. - Ilene Qua, MD

## 2019-06-10 ENCOUNTER — Other Ambulatory Visit: Payer: Self-pay

## 2019-06-10 ENCOUNTER — Telehealth: Payer: Self-pay | Admitting: *Deleted

## 2019-06-10 ENCOUNTER — Encounter (INDEPENDENT_AMBULATORY_CARE_PROVIDER_SITE_OTHER): Payer: Self-pay | Admitting: Family Medicine

## 2019-06-10 ENCOUNTER — Telehealth (INDEPENDENT_AMBULATORY_CARE_PROVIDER_SITE_OTHER): Payer: 59 | Admitting: Family Medicine

## 2019-06-10 DIAGNOSIS — E559 Vitamin D deficiency, unspecified: Secondary | ICD-10-CM

## 2019-06-10 DIAGNOSIS — Z6841 Body Mass Index (BMI) 40.0 and over, adult: Secondary | ICD-10-CM | POA: Diagnosis not present

## 2019-06-10 DIAGNOSIS — E1165 Type 2 diabetes mellitus with hyperglycemia: Secondary | ICD-10-CM | POA: Diagnosis not present

## 2019-06-10 MED ORDER — VITAMIN D (ERGOCALCIFEROL) 1.25 MG (50000 UNIT) PO CAPS: 50000.0000 [IU] | ORAL_CAPSULE | ORAL | 0 refills | Status: DC

## 2019-06-10 MED FILL — TRULICITY 1.5 MG/0.5 ML PEN: 1.5 | 28 days supply | Qty: 2 | Fill #3

## 2019-06-10 MED FILL — VIT D2 1.25 MG (50,000 UNIT: 1.25 MG | 28 days supply | Qty: 4 | Fill #0

## 2019-06-10 NOTE — Telephone Encounter (Signed)
Pt canceled follow up with Dr. Agustin Cree on 06/14/19. Left message that stress test was low risk and to call with any questions.

## 2019-06-10 NOTE — Telephone Encounter (Signed)
-----   Message from Loel Dubonnet, NP sent at 05/05/2019  8:25 AM EDT ----- Stress test was a low risk study. She has been scheduled for follow up with Dr. Agustin Cree in [place] on 06/14/19 at 1:55PM.

## 2019-06-11 MED FILL — XIGDUO XR 5 MG-1,000 MG TAB: 5-1000 | 90 days supply | Qty: 180 | Fill #1

## 2019-06-14 ENCOUNTER — Ambulatory Visit: Payer: 59 | Admitting: Cardiology

## 2019-06-15 NOTE — Progress Notes (Signed)
Office: [phone]  /  Fax: [phone] TeleHealth Visit:  [name] has verbally consented to this TeleHealth visit today. The patient is located at home, the provider is located at the [employer] [facility] office. The participants in this visit include the listed provider and patient. The visit was conducted today via webex.  HPI:   Chief Complaint: OBESITY Elizabeth [name] is here to discuss her progress with her obesity treatment plan. She is on the Category 3 plan and is following her eating plan approximately 50 % of the time. She states she is walking for 15 minutes 3 times per week. Elizabeth [name] voices financial difficulty the last few weeks. Her birthday is tomorrow. She has tried to make the best choices with what's available at home. She voices weight gain of 3 lbs.  We were unable to weigh the patient today for this TeleHealth visit. She feels as if she has gained 3 lbs since her last visit. She has lost 5 lbs since starting treatment with [name].  Diabetes II with Hyperglycemia Elizabeth [name] has a diagnosis of diabetes type II. Elizabeth [name] states her BGs are averaging between 140 and 150's, the lowest BGs of 79. She did have symptoms of hypoglycemia with BGs of 79. Last A1c was 7.3. She has been working on intensive lifestyle modifications including diet, exercise, and weight loss to help control her blood glucose levels.  Vitamin D Deficiency Elizabeth [name] has a diagnosis of vitamin D deficiency. She is currently taking prescription Vit D. She notes fatigue and denies nausea, vomiting or muscle weakness.  ASSESSMENT AND PLAN:  Type 2 diabetes mellitus with hyperglycemia, without long-term current use of insulin (HCC)  Vitamin D deficiency - Plan: Vitamin D, Ergocalciferol, (DRISDOL) 1.25 MG (50000 UT) CAPS capsule  Class 3 severe obesity with serious comorbidity and body mass index (BMI) of 50.0 to 59.9 in adult, unspecified obesity type (HCC)  PLAN:  Diabetes II with Hyperglycemia Elizabeth [name]  has been given extensive diabetes education by myself today including ideal fasting and post-prandial blood glucose readings, individual ideal Hgb A1c goals and hypoglycemia prevention. We discussed the importance of good blood sugar control to decrease the likelihood of diabetic complications such as nephropathy, neuropathy, limb loss, blindness, coronary artery disease, and death. We discussed the importance of intensive lifestyle modification including diet, exercise and weight loss as the first line treatment for diabetes. Keliyah agrees to continue her diabetes medications, no change in doses. Korin agrees to follow up with our clinic in 2 weeks.  Vitamin D Deficiency Elizabeth [name] was informed that low vitamin D levels contributes to fatigue and are associated with obesity, breast, and colon cancer. Porshea agrees to continue taking prescription Vit D 50,000 IU every week #4 and we will refill for 1 month. She will follow up for routine testing of vitamin D, at least 2-3 times per year. She was informed of the risk of over-replacement of vitamin D and agrees to not increase her dose unless she discusses this with [name] first. Elizabeth [name] agrees to follow up with our clinic in 2 weeks.  Obesity Elizabeth [name] is currently in the action stage of change. As such, her goal is to continue with weight loss efforts She has agreed to keep a food journal with 1450-1600 calories and 95+ grams of protein daily or follow the Category 3 plan Elizabeth [name] has been instructed to work up to a goal of 150 minutes of combined cardio and strengthening exercise per week for weight loss and overall health benefits. We discussed the  following Behavioral Modification Strategies today: increasing lean protein intake, increasing vegetables and work on meal planning and easy cooking plans, keeping healthy foods in the home, and planning for success   Elizabeth [name] has agreed to follow up with our clinic in 2 weeks. She was informed of the importance  of frequent follow up visits to maximize her success with intensive lifestyle modifications for her multiple health conditions.  ALLERGIES: No Known Allergies  MEDICATIONS: Current Outpatient Medications on File Prior to Visit  Medication Sig Dispense Refill  . amLODipine (NORVASC) 10 MG tablet TAKE 1 TABLET (10 MG TOTAL) BY MOUTH DAILY. 30 tablet 5  . aspirin EC 81 MG tablet Take 1 tablet (81 mg total) by mouth daily. 90 tablet 3  . Continuous Blood Gluc Sensor MISC 1 each by Does not apply route as directed. Use as directed every 14 days. May dispense FreeStyle [employer] or similar. 1 each 0  . Dapagliflozin-metFORMIN HCl ER 02-999 MG TB24 Take by mouth.    . Dulaglutide 1.5 MG/0.5ML SOPN Inject into the skin.    [name] etonogestrel (NEXPLANON) 68 MG IMPL implant 1 each by Subdermal route once.    [name] glipiZIDE (GLUCOTROL) 10 MG tablet TAKE 1 TABLET (10 MG TOTAL) BY MOUTH 2 (TWO) TIMES DAILY BEFORE A MEAL. 60 tablet 5  . lisinopril (ZESTRIL) 40 MG tablet Take 1 tablet (40 mg total) by mouth daily. 90 tablet 1  . lovastatin (MEVACOR) 40 MG tablet Take 1 tablet (40 mg total) by mouth at bedtime. 90 tablet 1  . meloxicam (MOBIC) 7.5 MG tablet Take 7.5 mg by mouth daily.    [name] albuterol (PROVENTIL HFA;VENTOLIN HFA) 108 (90 Base) MCG/ACT inhaler Inhale into the lungs.    . norethindrone (MICRONOR) 0.35 MG tablet TAKE 1 TABLET (0.35 MG TOTAL) BY MOUTH DAILY. 28 tablet 11   No current facility-administered medications on file prior to visit.     PAST MEDICAL HISTORY: Past Medical History:  Diagnosis Date  . Anemia   . Arthritis of left knee   . Astigmatism   . Diabetes mellitus without complication ([place])   . Dry mouth   . Fatigue   . GERD (gastroesophageal reflux disease)   . Heartburn   . High cholesterol   . Hypertension   . Keratoconjunctivitis   . Muscle pain   . Vitamin D deficiency   . Weakness     PAST SURGICAL HISTORY: Past Surgical History:  Procedure Laterality  Date  . DILATION AND CURETTAGE OF UTERUS    . ENDOMETRIAL BIOPSY  12/19/2017   normal per pt  . TONSILLECTOMY    . TUBAL LIGATION      SOCIAL HISTORY: Social History   Tobacco Use  . Smoking status: Former Smoker    Years: 3.00  . Smokeless tobacco: Never Used  Substance Use Topics  . Alcohol use: No  . Drug use: No    FAMILY HISTORY: Family History  Problem Relation Age of Onset  . Diabetes Mother   . Hypertension Mother   . Hyperlipidemia Mother   . Obesity Mother   . Ovarian cancer Maternal Grandmother   . HIV Father        died from complications (IVDU)  . Alcohol abuse Father   . Drug abuse Father     ROS: Review of Systems  Constitutional: Positive for malaise/fatigue. Negative for weight loss.  Gastrointestinal: Negative for nausea and vomiting.  Musculoskeletal:       Negative muscle weakness  Endo/Heme/Allergies:       Negative hypoglycemia    PHYSICAL EXAM: Pt in no acute distress  RECENT LABS AND TESTS: BMET    Component Value Date/Time   NA 138 12/22/2018 1430   K 4.4 12/22/2018 1430   CL 102 12/22/2018 1430   CO2 17 (L) 12/22/2018 1430   GLUCOSE 133 (H) 12/22/2018 1430   GLUCOSE 172 (H) 01/21/2018 0825   BUN 7 12/22/2018 1430   CREATININE 0.58 12/22/2018 1430   CALCIUM 9.2 12/22/2018 1430   GFRNONAA 116 12/22/2018 1430   GFRAA 133 12/22/2018 1430   Lab Results  Component Value Date   HGBA1C 7.3 (H) 12/22/2018   HGBA1C 7.8 12/31/2017   Lab Results  Component Value Date   INSULIN 28.4 (H) 12/22/2018   CBC    Component Value Date/Time   WBC 7.1 12/22/2018 1430   WBC 6.4 01/21/2018 0825   RBC 5.99 (H) 12/22/2018 1430   RBC 5.81 (H) 01/21/2018 0825   HGB 13.3 12/22/2018 1430   HCT 43.4 12/22/2018 1430   PLT 301 12/22/2018 1430   MCV 73 (L) 12/22/2018 1430   MCH 22.2 (L) 12/22/2018 1430   MCH 22.9 (L) 06/25/2011 1240   MCHC 30.6 (L) 12/22/2018 1430   MCHC 31.5 01/21/2018 0825   RDW 15.9 (H) 12/22/2018 1430   LYMPHSABS 2.1  12/22/2018 1430   MONOABS 0.4 01/21/2018 0825   EOSABS 0.1 12/22/2018 1430   BASOSABS 0.1 12/22/2018 1430   Iron/TIBC/Ferritin/ %Sat No results found for: IRON, TIBC, FERRITIN, IRONPCTSAT Lipid Panel     Component Value Date/Time   CHOL 148 12/22/2018 1430   TRIG 61 12/22/2018 1430   HDL 40 12/22/2018 1430   CHOLHDL 3.7 12/22/2018 1430   CHOLHDL 4 01/21/2018 0825   VLDL 14.2 01/21/2018 0825   LDLCALC 96 12/22/2018 1430   Hepatic Function Panel     Component Value Date/Time   PROT 6.8 12/22/2018 1430   ALBUMIN 4.3 12/22/2018 1430   AST 15 12/22/2018 1430   ALT 21 12/22/2018 1430   ALKPHOS 87 12/22/2018 1430   BILITOT 0.3 12/22/2018 1430      Component Value Date/Time   TSH 0.837 12/22/2018 1430   TSH 1.67 01/21/2018 0825      I, Trixie Dredge, am acting as transcriptionist for Ilene Qua, MD  I have reviewed the above documentation for accuracy and completeness, and I agree with the above. - Ilene Qua, MD

## 2019-06-22 ENCOUNTER — Ambulatory Visit (INDEPENDENT_AMBULATORY_CARE_PROVIDER_SITE_OTHER): Payer: 59 | Admitting: Family Medicine

## 2019-06-29 ENCOUNTER — Encounter: Payer: Self-pay | Admitting: Family

## 2019-06-29 ENCOUNTER — Ambulatory Visit (INDEPENDENT_AMBULATORY_CARE_PROVIDER_SITE_OTHER): Payer: 59 | Admitting: Family Medicine

## 2019-06-30 ENCOUNTER — Ambulatory Visit (INDEPENDENT_AMBULATORY_CARE_PROVIDER_SITE_OTHER): Payer: 59 | Admitting: Family

## 2019-06-30 ENCOUNTER — Other Ambulatory Visit: Payer: Self-pay

## 2019-06-30 VITALS — Wt 290.0 lb

## 2019-06-30 DIAGNOSIS — U071 COVID-19: Secondary | ICD-10-CM | POA: Diagnosis not present

## 2019-06-30 NOTE — Progress Notes (Signed)
Virtual Visit via Video Note  I connected with Elizabeth [name] on 06/30/19 at 11:20 AM EDT by a video enabled telemedicine application and verified that I am speaking with the correct person using two identifiers.  Location: Patient: home Provider: home    I discussed the limitations of evaluation and management by telemedicine and the availability of in person appointments. The patient expressed understanding and agreed to proceed.  History of Present Illness:  Patient is a 43 yr old female who presents today following COVID-19 positive diagnosis.   Mom tested positive (mom works in [place]). She lives with her mom, husband and 2 sons.  Husband is showing symptoms. Oldest son lost taste/smell. Youngest son is asymptomatic. Family members were all tested today.  [place] at work had patient complete Covid testing on 9/4 upon learning that her mom had tested positive. Patient was contacted on Saturday AM 9/5 and notified that she tested positive for COVID-19.  Reports initially she was asymptomatic but yesterday she experienced diarrhea which is improved today.  Today she reports +HA/sinus pressure post nasal drip. She denies sob/fever. Temp today is 97.9.  She does  Not have an oxygen meter.  Notes decreased sense of smell.  No body aches.     Past Medical History:  Diagnosis Date  . Anemia   . Arthritis of left knee   . Astigmatism   . Diabetes mellitus without complication ([place])   . Dry mouth   . Fatigue   . GERD (gastroesophageal reflux disease)   . Heartburn   . High cholesterol   . Hypertension   . Keratoconjunctivitis   . Muscle pain   . Vitamin D deficiency   . Weakness      Social History   Socioeconomic History  . Marital status: Married    Spouse name: [name]  . Number of children: 2  . Years of education: Not on file  . Highest education level: Not on file  Occupational History  . Occupation: [place]: [employer]  .  Financial resource strain: Not on file  . Food insecurity    Worry: Not on file    Inability: Not on file  . Transportation needs    Medical: Not on file    Non-medical: Not on file  Tobacco Use  . Smoking status: Former Smoker    Years: 3.00  . Smokeless tobacco: Never Used  Substance and Sexual Activity  . Alcohol use: No  . Drug use: No  . Sexual activity: Yes    Partners: Male    Birth control/protection: Surgical, Implant  Lifestyle  . Physical activity    Days per week: Not on file    Minutes per session: Not on file  . Stress: Not on file  Relationships  . Social Herbalist on phone: Not on file    Gets together: Not on file    Attends religious service: Not on file    Active member of club or organization: Not on file    Attends meetings of clubs or organizations: Not on file    Relationship status: Not on file  . Intimate partner violence    Fear of current or ex partner: Not on file    Emotionally abused: Not on file    Physically abused: Not on file    Forced sexual activity: Not on file  Other Topics Concern  . Not on file  Social History  Narrative   Married 2 children   1997- son Laquan   2000- son Insurance claims handler in [place] in [place]   Enjoys reading    Past Surgical History:  Procedure Laterality Date  . DILATION AND CURETTAGE OF UTERUS    . ENDOMETRIAL BIOPSY  12/19/2017   normal per pt  . TONSILLECTOMY    . TUBAL LIGATION      Family History  Problem Relation Age of Onset  . Diabetes Mother   . Hypertension Mother   . Hyperlipidemia Mother   . Obesity Mother   . Ovarian cancer Maternal Grandmother   . HIV Father        died from complications (IVDU)  . Alcohol abuse Father   . Drug abuse Father     No Known Allergies  Current Outpatient Medications on File Prior to Visit  Medication Sig Dispense Refill  . albuterol (PROVENTIL HFA;VENTOLIN HFA) 108 (90 Base) MCG/ACT inhaler Inhale into the lungs.     [name] amLODipine (NORVASC) 10 MG tablet TAKE 1 TABLET (10 MG TOTAL) BY MOUTH DAILY. 30 tablet 5  . aspirin EC 81 MG tablet Take 1 tablet (81 mg total) by mouth daily. 90 tablet 3  . Continuous Blood Gluc Sensor MISC 1 each by Does not apply route as directed. Use as directed every 14 days. May dispense FreeStyle [employer] or similar. 1 each 0  . Dapagliflozin-metFORMIN HCl ER 02-999 MG TB24 Take by mouth.    . Dulaglutide 1.5 MG/0.5ML SOPN Inject into the skin.    [name] etonogestrel (NEXPLANON) 68 MG IMPL implant 1 each by Subdermal route once.    [name] glipiZIDE (GLUCOTROL) 10 MG tablet TAKE 1 TABLET (10 MG TOTAL) BY MOUTH 2 (TWO) TIMES DAILY BEFORE A MEAL. 60 tablet 5  . lisinopril (ZESTRIL) 40 MG tablet Take 1 tablet (40 mg total) by mouth daily. 90 tablet 1  . lovastatin (MEVACOR) 40 MG tablet Take 1 tablet (40 mg total) by mouth at bedtime. 90 tablet 1  . meloxicam (MOBIC) 7.5 MG tablet Take 7.5 mg by mouth daily.    . Vitamin D, Ergocalciferol, (DRISDOL) 1.25 MG (50000 UT) CAPS capsule Take 1 capsule (50,000 Units total) by mouth every 7 (seven) days. 4 capsule 0  . norethindrone (MICRONOR) 0.35 MG tablet TAKE 1 TABLET (0.35 MG TOTAL) BY MOUTH DAILY. 28 tablet 11   No current facility-administered medications on file prior to visit.     Wt 290 lb (131.5 kg)   BMI 52.20 kg/m    Observations/Objective:   Gen: Awake, alert, no acute distress Resp: Breathing is even and non-labored Psych: calm/pleasant demeanor Neuro: Alert and Oriented x 3, + facial symmetry, speech is clear.   Assessment and Plan:  COVID-19 infection- clinically stable. Advised pt to call if symptoms worsen or if symptom fail to improve. Discussed supportive measures (rest, hydration and tylenol or motrin as needed for fever/myalgia). She is advised to go to the ER if she develops shortness of breath. Advised pt on need to quarantine a minimum of 10 days from time of diagnosis and must be fever free and symptoms  free for at least 3 days before removing quarantine. Pt verbalizes understanding.   Follow Up Instructions:    I discussed the assessment and treatment plan with the patient. The patient was provided an opportunity to ask questions and all were answered. The patient agreed with the plan and demonstrated an understanding of the instructions.   The  patient was advised to call back or seek an in-person evaluation if the symptoms worsen or if the condition fails to improve as anticipated.  Elizabeth Pear, NP

## 2019-07-14 MED FILL — glipiZIDE 10 MG TABS: 10 | 30 days supply | Qty: 60 | Fill #1

## 2019-07-14 MED FILL — TRULICITY 1.5 MG/0.5 ML PEN: 1.5 | 28 days supply | Qty: 2 | Fill #4

## 2019-07-14 MED FILL — VIT D2 1.25 MG (50,000 UNIT: 1.25 MG | 28 days supply | Qty: 4 | Fill #0

## 2019-07-19 ENCOUNTER — Encounter: Payer: Self-pay | Admitting: Family

## 2019-07-20 DIAGNOSIS — Z634 Disappearance and death of family member: Secondary | ICD-10-CM | POA: Diagnosis not present

## 2019-07-20 DIAGNOSIS — I10 Essential (primary) hypertension: Secondary | ICD-10-CM | POA: Diagnosis not present

## 2019-07-20 DIAGNOSIS — Z6841 Body Mass Index (BMI) 40.0 and over, adult: Secondary | ICD-10-CM | POA: Diagnosis not present

## 2019-07-20 DIAGNOSIS — E119 Type 2 diabetes mellitus without complications: Secondary | ICD-10-CM | POA: Diagnosis not present

## 2019-07-20 MED ORDER — TRAZODONE HCL 50 MG PO TABS: 25.0000 mg | ORAL_TABLET | Freq: Every evening | ORAL | 0 refills | Status: DC | PRN

## 2019-07-20 MED FILL — traZODone HCL 50 MG TABS: 50 | 30 days supply | Qty: 30 | Fill #0

## 2019-07-21 ENCOUNTER — Ambulatory Visit: Payer: 59 | Admitting: Family

## 2019-07-28 ENCOUNTER — Encounter (INDEPENDENT_AMBULATORY_CARE_PROVIDER_SITE_OTHER): Payer: Self-pay | Admitting: Family Medicine

## 2019-07-28 NOTE — Telephone Encounter (Signed)
Please review

## 2019-08-06 ENCOUNTER — Encounter: Payer: Self-pay | Admitting: Family

## 2019-08-06 NOTE — Telephone Encounter (Signed)
Please offer her a virtual visit on Monday instead.

## 2019-08-09 ENCOUNTER — Ambulatory Visit (INDEPENDENT_AMBULATORY_CARE_PROVIDER_SITE_OTHER): Payer: 59 | Admitting: Family

## 2019-08-09 ENCOUNTER — Other Ambulatory Visit: Payer: Self-pay

## 2019-08-09 ENCOUNTER — Encounter: Payer: Self-pay | Admitting: Family

## 2019-08-09 ENCOUNTER — Telehealth: Payer: Self-pay | Admitting: Family

## 2019-08-09 DIAGNOSIS — G4700 Insomnia, unspecified: Secondary | ICD-10-CM

## 2019-08-09 MED ORDER — ZOLPIDEM TARTRATE 5 MG PO TABS: 5.0000 mg | ORAL_TABLET | Freq: Every evening | ORAL | 0 refills | Status: DC | PRN

## 2019-08-09 MED FILL — TRULICITY 1.5 MG/0.5 ML PEN: 1.5 | 28 days supply | Qty: 2 | Fill #0

## 2019-08-09 MED FILL — ZOLPIDEM TARTRATE 5 MG TAB: 5 | 30 days supply | Qty: 30 | Fill #0

## 2019-08-09 NOTE — Progress Notes (Signed)
Virtual Visit via Video Note  I connected with Elizabeth [name] on 08/09/19 at 11:00 AM EDT by a video enabled telemedicine application and verified that I am speaking with the correct person using two identifiers.  Location: Patient: home Provider: home   I discussed the limitations of evaluation and management by telemedicine and the availability of in person appointments. The patient expressed understanding and agreed to proceed.  History of Present Illness:  Patient is a 43 yr old female who presents today to discuss insomnia. He husband unfortunately passed from [id number] complications on [id number]. The patient herself as well as her two sons contracted COVID-19 as well and have all recovered. We prescribed trazodone trial for sleep. She reports that even if she takes the full tablet she still lays awake and can't sleep. She is working weekly with her EAP therapist.  She reports that she has requested a 3 week leave of absence from her work.  She tried to return to work but experienced panic attacks. Reports some ongoing anxiety.   Past Medical History:  Diagnosis Date  . Anemia   . Arthritis of left knee   . Astigmatism   . Diabetes mellitus without complication ([place])   . Dry mouth   . Fatigue   . GERD (gastroesophageal reflux disease)   . Heartburn   . High cholesterol   . Hypertension   . Keratoconjunctivitis   . Muscle pain   . Vitamin D deficiency   . Weakness      Social History   Socioeconomic History  . Marital status: Married    Spouse name: Elizabeth [name]  . Number of children: 2  . Years of education: Not on file  . Highest education level: Not on file  Occupational History  . Occupation: [employer]: [employer]  . Financial resource strain: Not on file  . Food insecurity    Worry: Not on file    Inability: Not on file  . Transportation needs    Medical: Not on file    Non-medical: Not on file  Tobacco Use  . Smoking  status: Former Smoker    Years: 3.00  . Smokeless tobacco: Never Used  Substance and Sexual Activity  . Alcohol use: No  . Drug use: No  . Sexual activity: Yes    Partners: Male    Birth control/protection: Surgical, Implant  Lifestyle  . Physical activity    Days per week: Not on file    Minutes per session: Not on file  . Stress: Not on file  Relationships  . Social Herbalist on phone: Not on file    Gets together: Not on file    Attends religious service: Not on file    Active member of club or organization: Not on file    Attends meetings of clubs or organizations: Not on file    Relationship status: Not on file  . Intimate partner violence    Fear of current or ex partner: Not on file    Emotionally abused: Not on file    Physically abused: Not on file    Forced sexual activity: Not on file  Other Topics Concern  . Not on file  Social History Narrative   Married 2 children   1997- son Elizabeth [name]   2000- son Insurance claims handler in [place] in [place]   Enjoys reading    Past Surgical History:  Procedure Laterality  Date  . DILATION AND CURETTAGE OF UTERUS    . ENDOMETRIAL BIOPSY  12/19/2017   normal per pt  . TONSILLECTOMY    . TUBAL LIGATION      Family History  Problem Relation Age of Onset  . Diabetes Mother   . Hypertension Mother   . Hyperlipidemia Mother   . Obesity Mother   . Ovarian cancer Maternal Grandmother   . HIV Father        died from complications (IVDU)  . Alcohol abuse Father   . Drug abuse Father     No Known Allergies  Current Outpatient Medications on File Prior to Visit  Medication Sig Dispense Refill  . albuterol (PROVENTIL HFA;VENTOLIN HFA) 108 (90 Base) MCG/ACT inhaler Inhale into the lungs.    [name] amLODipine (NORVASC) 10 MG tablet TAKE 1 TABLET (10 MG TOTAL) BY MOUTH DAILY. 30 tablet 5  . Continuous Blood Gluc Sensor MISC 1 each by Does not apply route as directed. Use as directed every 14 days. May  dispense FreeStyle [employer] or similar. 1 each 0  . Dapagliflozin-metFORMIN HCl ER 02-999 MG TB24 Take by mouth.    . Dulaglutide 1.5 MG/0.5ML SOPN Inject into the skin.    [name] etonogestrel (NEXPLANON) 68 MG IMPL implant 1 each by Subdermal route once.    [name] glipiZIDE (GLUCOTROL) 10 MG tablet TAKE 1 TABLET (10 MG TOTAL) BY MOUTH 2 (TWO) TIMES DAILY BEFORE A MEAL. 60 tablet 5  . lisinopril (ZESTRIL) 40 MG tablet Take 1 tablet (40 mg total) by mouth daily. 90 tablet 1  . lovastatin (MEVACOR) 40 MG tablet Take 1 tablet (40 mg total) by mouth at bedtime. 90 tablet 1  . meloxicam (MOBIC) 7.5 MG tablet Take 7.5 mg by mouth daily.    . traZODone (DESYREL) 50 MG tablet Take 0.5-1 tablets (25-50 mg total) by mouth at bedtime as needed for sleep. 30 tablet 0  . Vitamin D, Ergocalciferol, (DRISDOL) 1.25 MG (50000 UT) CAPS capsule Take 1 capsule (50,000 Units total) by mouth every 7 (seven) days. 4 capsule 0  . aspirin EC 81 MG tablet Take 1 tablet (81 mg total) by mouth daily. 90 tablet 3  . norethindrone (MICRONOR) 0.35 MG tablet TAKE 1 TABLET (0.35 MG TOTAL) BY MOUTH DAILY. 28 tablet 11   No current facility-administered medications on file prior to visit.     There were no vitals taken for this visit.        Observations/Objective:   Gen: Awake, alert, no acute distress, appears tired appearing Resp: Breathing is even and non-labored Psych: calm/pleasant demeanor Neuro: Alert and Oriented x 3, + facial symmetry, speech is clear.   Assessment and Plan:  Insomnia- uncontrolled. Will d/c trazodone, and give trial of ambien.  Reviewed [place] controlled substance registry.  Plan to have pt come to the lab to provide UDS and sign a controlled substance contract.    Follow Up Instructions:    I discussed the assessment and treatment plan with the patient. The patient was provided an opportunity to ask questions and all were answered. The patient agreed with the plan and demonstrated an  understanding of the instructions.   The patient was advised to call back or seek an in-person evaluation if the symptoms worsen or if the condition fails to improve as anticipated.  Elizabeth Pear, NP

## 2019-08-09 NOTE — Telephone Encounter (Signed)
Please schedule pt a 1 month follow up with me- ok to do virtually on a Monday if she would like. Also, please schedule a lab visit for UDS and to sign a contract for new start of Ambien 5mg . UDS order is in system.

## 2019-08-12 ENCOUNTER — Other Ambulatory Visit: Payer: 59

## 2019-08-12 ENCOUNTER — Other Ambulatory Visit: Payer: Self-pay

## 2019-08-12 DIAGNOSIS — G4700 Insomnia, unspecified: Secondary | ICD-10-CM

## 2019-08-13 ENCOUNTER — Ambulatory Visit: Payer: 59 | Admitting: Family

## 2019-08-13 LAB — PAIN MGMT, PROFILE 8 W/CONF, U
6 Acetylmorphine: NEGATIVE ng/mL
Alcohol Metabolites: NEGATIVE ng/mL (ref <500)
Amphetamines: NEGATIVE ng/mL
Benzodiazepines: NEGATIVE ng/mL
Buprenorphine, Urine: NEGATIVE ng/mL
Cocaine Metabolite: NEGATIVE ng/mL
Creatinine: 295.7 mg/dL
MDMA: NEGATIVE ng/mL
Marijuana Metabolite: NEGATIVE ng/mL
Opiates: NEGATIVE ng/mL
Oxidant: NEGATIVE ug/mL
Oxycodone: NEGATIVE ng/mL
pH: 6 (ref 4.5–9.0)

## 2019-08-16 NOTE — Telephone Encounter (Signed)
Just checking that you saw this message.

## 2019-08-16 NOTE — Telephone Encounter (Signed)
I did, patient had [employer] and contract on 10-22 and she is scheduled for follow up on 09-08-2019

## 2019-08-17 ENCOUNTER — Other Ambulatory Visit: Payer: Self-pay | Admitting: Family

## 2019-08-17 ENCOUNTER — Other Ambulatory Visit (INDEPENDENT_AMBULATORY_CARE_PROVIDER_SITE_OTHER): Payer: Self-pay | Admitting: Family Medicine

## 2019-08-17 DIAGNOSIS — E559 Vitamin D deficiency, unspecified: Secondary | ICD-10-CM

## 2019-08-17 MED FILL — AMLODIPINE BESYLATE 10 MG T: 10 | 30 days supply | Qty: 30 | Fill #0

## 2019-08-17 MED FILL — LISINOPRIL 40 MG TABLET: 40 | 90 days supply | Qty: 90 | Fill #0

## 2019-08-18 ENCOUNTER — Ambulatory Visit (INDEPENDENT_AMBULATORY_CARE_PROVIDER_SITE_OTHER): Payer: 59 | Admitting: Bariatrics

## 2019-08-18 ENCOUNTER — Encounter (INDEPENDENT_AMBULATORY_CARE_PROVIDER_SITE_OTHER): Payer: Self-pay | Admitting: Bariatrics

## 2019-08-18 ENCOUNTER — Other Ambulatory Visit: Payer: Self-pay

## 2019-08-18 DIAGNOSIS — E11649 Type 2 diabetes mellitus with hypoglycemia without coma: Secondary | ICD-10-CM

## 2019-08-18 DIAGNOSIS — Z6841 Body Mass Index (BMI) 40.0 and over, adult: Secondary | ICD-10-CM | POA: Diagnosis not present

## 2019-08-18 DIAGNOSIS — E559 Vitamin D deficiency, unspecified: Secondary | ICD-10-CM | POA: Diagnosis not present

## 2019-08-18 MED ORDER — VITAMIN D (ERGOCALCIFEROL) 1.25 MG (50000 UNIT) PO CAPS: 50000.0000 [IU] | ORAL_CAPSULE | ORAL | 0 refills | Status: DC

## 2019-08-18 MED FILL — VIT D2 1.25 MG (50,000 UNIT: 1.25 MG | 28 days supply | Qty: 4 | Fill #0

## 2019-08-18 NOTE — Progress Notes (Signed)
Office: [phone]  /  Fax: [phone] TeleHealth Visit:  Elizabeth [name] has verbally consented to this TeleHealth visit today. The patient is located at work, the provider is located at the [facility] office. The participants in this visit include the listed provider and patient. The visit was conducted today via Webex.  HPI:   Chief Complaint: OBESITY Elizabeth [name] is here to discuss her progress with her obesity treatment plan. She is not currently following a plan and she states she is exercising 0 minutes 0 times per week. Elizabeth [name] states her weight is going down. She states her husband passed away due to COVID-19 and she has not seen Dr. Adair Patter for 2 months. She returned to work on Monday. We were unable to weigh the patient today for this TeleHealth visit. She feels as if she has lost weight since her last visit. She has lost 5 lbs since starting treatment with [name].  Vitamin D deficiency Elizabeth [name] has a diagnosis of Vitamin D deficiency. Last Vitamin D 19.1 on 12/22/2018. She is currently taking prescription Vit D and denies nausea, vomiting or muscle weakness.  Diabetes II, uncontrolled Elizabeth [name] has a diagnosis of diabetes type II. She stopped taking glipizide 10/23 due to low blood sugars and is taking Dapagliflozin-metformin. Elizabeth [name] does not report checking her blood sugars. Last A1c was 7.3 on 12/22/2018 with an insulin of 28.4. She has been working on intensive lifestyle modifications including diet, exercise, and weight loss to help control her blood glucose levels.  ASSESSMENT AND PLAN:  Vitamin D deficiency - Plan: Vitamin D, Ergocalciferol, (DRISDOL) 1.25 MG (50000 UT) CAPS capsule  Uncontrolled type 2 diabetes mellitus with hypoglycemia without coma (HCC)  Class 3 severe obesity with serious comorbidity and body mass index (BMI) of 40.0 to 44.9 in adult, unspecified obesity type ([place])  PLAN:  Vitamin D Deficiency Elizabeth [name] was informed that low Vitamin D  levels contributes to fatigue and are associated with obesity, breast, and colon cancer. She agrees to continue to take prescription Vit D @ 50,000 IU every week #4 with 0 refills and will follow-up for routine testing of Vitamin D, at least 2-3 times per year. She was informed of the risk of over-replacement of Vitamin D and agrees to not increase her dose unless she discusses this with [name] first. Elizabeth [name] agrees to follow-up with our clinic in 2-3 weeks.  Diabetes II, uncontrolled Elizabeth [name] has been given extensive diabetes education by myself today including ideal fasting and post-prandial blood glucose readings, individual ideal HgA1c goals  and hypoglycemia prevention. We discussed the importance of good blood sugar control to decrease the likelihood of diabetic complications such as nephropathy, neuropathy, limb loss, blindness, coronary artery disease, and death. We discussed the importance of intensive lifestyle modification including diet, exercise and weight loss as the first line treatment for diabetes. Elizabeth [name] agrees to continue her diabetes medications and remain off of glipizide. She will follow-up at the agreed upon time.  Obesity Elizabeth [name] is currently in the action stage of change. As such, her goal is to continue with weight loss efforts. She has agreed to follow the Category 3 plan. Elizabeth [name] will weigh herself at home, increase her protein intake (eat protein first), and will not skip breakfast. Elizabeth [name] has been instructed to continue to walk at work and at home for weight loss and overall health benefits. We discussed the following Behavioral Modification Strategies today: .increasing lean protein intake, decreasing simple carbohydrates, increasing vegetables, increase H20 intake, decrease eating out, no skipping  meals, work on meal planning and easy cooking plans, keeping healthy foods in the home, and planning for success.  Elizabeth [name] has agreed to follow-up with our clinic in 2-3 weeks. She  was informed of the importance of frequent follow-up visits to maximize her success with intensive lifestyle modifications for her multiple health conditions.  ALLERGIES: No Known Allergies  MEDICATIONS: Current Outpatient Medications on File Prior to Visit  Medication Sig Dispense Refill  . albuterol (PROVENTIL HFA;VENTOLIN HFA) 108 (90 Base) MCG/ACT inhaler Inhale into the lungs.    [name] amLODipine (NORVASC) 10 MG tablet TAKE 1 TABLET (10 MG TOTAL) BY MOUTH DAILY. 30 tablet 5  . aspirin EC 81 MG tablet Take 1 tablet (81 mg total) by mouth daily. 90 tablet 3  . Continuous Blood Gluc Sensor MISC 1 each by Does not apply route as directed. Use as directed every 14 days. May dispense FreeStyle [employer] or similar. 1 each 0  . Dapagliflozin-metFORMIN HCl ER 02-999 MG TB24 Take by mouth.    . Dulaglutide 1.5 MG/0.5ML SOPN Inject into the skin.    [name] etonogestrel (NEXPLANON) 68 MG IMPL implant 1 each by Subdermal route once.    [name] glipiZIDE (GLUCOTROL) 10 MG tablet TAKE 1 TABLET (10 MG TOTAL) BY MOUTH 2 (TWO) TIMES DAILY BEFORE A MEAL. 60 tablet 5  . lisinopril (ZESTRIL) 40 MG tablet TAKE 1 TABLET (40 MG TOTAL) BY MOUTH DAILY. 90 tablet 1  . lovastatin (MEVACOR) 40 MG tablet Take 1 tablet (40 mg total) by mouth at bedtime. 90 tablet 1  . meloxicam (MOBIC) 7.5 MG tablet Take 7.5 mg by mouth daily.    . norethindrone (MICRONOR) 0.35 MG tablet TAKE 1 TABLET (0.35 MG TOTAL) BY MOUTH DAILY. 28 tablet 11  . zolpidem (AMBIEN) 5 MG tablet Take 1 tablet (5 mg total) by mouth at bedtime as needed for sleep. 30 tablet 0   No current facility-administered medications on file prior to visit.     PAST MEDICAL HISTORY: Past Medical History:  Diagnosis Date  . Anemia   . Arthritis of left knee   . Astigmatism   . Diabetes mellitus without complication ([place])   . Dry mouth   . Fatigue   . GERD (gastroesophageal reflux disease)   . Heartburn   . High cholesterol   . Hypertension   .  Keratoconjunctivitis   . Muscle pain   . Vitamin D deficiency   . Weakness     PAST SURGICAL HISTORY: Past Surgical History:  Procedure Laterality Date  . DILATION AND CURETTAGE OF UTERUS    . ENDOMETRIAL BIOPSY  12/19/2017   normal per pt  . TONSILLECTOMY    . TUBAL LIGATION      SOCIAL HISTORY: Social History   Tobacco Use  . Smoking status: Former Smoker    Years: 3.00  . Smokeless tobacco: Never Used  Substance Use Topics  . Alcohol use: No  . Drug use: No    FAMILY HISTORY: Family History  Problem Relation Age of Onset  . Diabetes Mother   . Hypertension Mother   . Hyperlipidemia Mother   . Obesity Mother   . Ovarian cancer Maternal Grandmother   . HIV Father        died from complications (IVDU)  . Alcohol abuse Father   . Drug abuse Father    ROS: Review of Systems  Gastrointestinal: Negative for nausea and vomiting.  Musculoskeletal:       Negative for muscle weakness.  PHYSICAL EXAM: Pt in no acute distress  RECENT LABS AND TESTS: BMET    Component Value Date/Time   NA 138 12/22/2018 1430   K 4.4 12/22/2018 1430   CL 102 12/22/2018 1430   CO2 17 (L) 12/22/2018 1430   GLUCOSE 133 (H) 12/22/2018 1430   GLUCOSE 172 (H) 01/21/2018 0825   BUN 7 12/22/2018 1430   CREATININE 0.58 12/22/2018 1430   CALCIUM 9.2 12/22/2018 1430   GFRNONAA 116 12/22/2018 1430   GFRAA 133 12/22/2018 1430   Lab Results  Component Value Date   HGBA1C 7.3 (H) 12/22/2018   HGBA1C 7.8 12/31/2017   Lab Results  Component Value Date   INSULIN 28.4 (H) 12/22/2018   CBC    Component Value Date/Time   WBC 7.1 12/22/2018 1430   WBC 6.4 01/21/2018 0825   RBC 5.99 (H) 12/22/2018 1430   RBC 5.81 (H) 01/21/2018 0825   HGB 13.3 12/22/2018 1430   HCT 43.4 12/22/2018 1430   PLT 301 12/22/2018 1430   MCV 73 (L) 12/22/2018 1430   MCH 22.2 (L) 12/22/2018 1430   MCH 22.9 (L) 06/25/2011 1240   MCHC 30.6 (L) 12/22/2018 1430   MCHC 31.5 01/21/2018 0825   RDW 15.9 (H)  12/22/2018 1430   LYMPHSABS 2.1 12/22/2018 1430   MONOABS 0.4 01/21/2018 0825   EOSABS 0.1 12/22/2018 1430   BASOSABS 0.1 12/22/2018 1430   Iron/TIBC/Ferritin/ %Sat No results found for: IRON, TIBC, FERRITIN, IRONPCTSAT Lipid Panel     Component Value Date/Time   CHOL 148 12/22/2018 1430   TRIG 61 12/22/2018 1430   HDL 40 12/22/2018 1430   CHOLHDL 3.7 12/22/2018 1430   CHOLHDL 4 01/21/2018 0825   VLDL 14.2 01/21/2018 0825   LDLCALC 96 12/22/2018 1430   Hepatic Function Panel     Component Value Date/Time   PROT 6.8 12/22/2018 1430   ALBUMIN 4.3 12/22/2018 1430   AST 15 12/22/2018 1430   ALT 21 12/22/2018 1430   ALKPHOS 87 12/22/2018 1430   BILITOT 0.3 12/22/2018 1430      Component Value Date/Time   TSH 0.837 12/22/2018 1430   TSH 1.67 01/21/2018 0825   Results for ECHO, CHAPARRO (MRN [id number]) as of 08/18/2019 14:22  Ref. Range 12/22/2018 14:30  Vitamin D, 25-Hydroxy Latest Ref Range: 30.0 - 100.0 ng/mL 19.1 (L)   I, Michaelene Song, am acting as Location manager for [employer], DO  I have reviewed the above documentation for accuracy and completeness, and I agree with the above. -Jearld Lesch, DO

## 2019-09-06 ENCOUNTER — Encounter: Payer: Self-pay | Admitting: Family

## 2019-09-06 ENCOUNTER — Ambulatory Visit (INDEPENDENT_AMBULATORY_CARE_PROVIDER_SITE_OTHER): Payer: 59 | Admitting: Family

## 2019-09-06 DIAGNOSIS — F32A Depression, unspecified: Secondary | ICD-10-CM

## 2019-09-06 DIAGNOSIS — F329 Major depressive disorder, single episode, unspecified: Secondary | ICD-10-CM

## 2019-09-06 DIAGNOSIS — F4321 Adjustment disorder with depressed mood: Secondary | ICD-10-CM

## 2019-09-06 MED ORDER — SERTRALINE HCL 50 MG PO TABS: ORAL_TABLET | ORAL | 0 refills | Status: DC

## 2019-09-06 MED FILL — SERTRALINE HCL 50 MG TABLET: 50 | 30 days supply | Qty: 30 | Fill #0

## 2019-09-06 NOTE — Progress Notes (Signed)
Virtual Visit via Video Note  I connected with [name] on 09/06/19 at  1:00 PM EST by a video enabled telemedicine application and verified that I am speaking with the correct person using two identifiers.  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 The patient expressed understanding and agreed to proceed.  History of Present Illness:  Patient is a 43 yr old female who presents to discuss depression and anxiety. She continues to grieve the loss of her husband who passed from COVID-19 on 07/04/19. Last visit we gave her a trial of ambien. She reports that she took it for 2 days, felt like a zombie and could not sleep, therefore she discontinued.  She works at [employer] at [place]. Reports that she came in this AM and was tearful, could not work. Was unable to come in on Friday "because I was a mess."  Reports that she has no appetite. She is adding a protein shake in the AM at the recommendation of her weight loss doctor.  Reports most recent weight was 280.     Wt Readings from Last 3 Encounters:  06/30/19 290 lb (131.5 kg)  04/30/19 293 lb 1.9 oz (133 kg)  04/20/19 293 lb (132.9 kg)   She is working with a Social worker through [facility], she has her 5th visit tomorrow.     Observations/Objective:   Gen: Awake, alert, no acute distress Resp: Breathing is even and non-labored Psych: calm/pleasant demeanor, flattened affect Neuro: Alert and Oriented x 3, + facial symmetry, speech is clear.   Assessment and Plan:  Depression/Grief reaction- I encouraged the patient to continue to work with her counselor. Also will initiate zoloft 50mg .  I instructed pt to start 1/2 tablet once daily for 1 week and then increase to a full tablet once daily on week two as tolerated.  We discussed common side effects such as nausea, drowsiness and weight gain. She is  instructed to discontinue medication go directly to ED if this occurs.  Pt verbalizes understanding.  Plan follow up in 3 weeks to evaluate progress.  I have also written her out of work for the next 4 weeks due to her inability to concentrate at work due to depression/grief reaction.      Follow Up Instructions:    I discussed the assessment and treatment plan with the patient. The patient was provided an opportunity to ask questions and all were answered. The patient agreed with the plan and demonstrated an understanding of the instructions.   The patient was advised to call back or seek an in-person evaluation if the symptoms worsen or if the condition fails to improve as anticipated.  [name], NP

## 2019-09-08 ENCOUNTER — Telehealth: Payer: 59 | Admitting: Family

## 2019-09-10 MED FILL — TRULICITY 1.5 MG/0.5 ML PEN: 1.5 | 28 days supply | Qty: 2 | Fill #1

## 2019-09-15 MED FILL — XIGDUO XR 5 MG-1,000 MG TAB: 5-1000 | 90 days supply | Qty: 180 | Fill #0

## 2019-09-15 MED FILL — AMLODIPINE BESYLATE 10 MG T: 10 | 90 days supply | Qty: 90 | Fill #3

## 2019-09-28 ENCOUNTER — Ambulatory Visit: Payer: 59 | Admitting: Family

## 2019-10-04 ENCOUNTER — Ambulatory Visit (INDEPENDENT_AMBULATORY_CARE_PROVIDER_SITE_OTHER): Payer: 59 | Admitting: Family

## 2019-10-04 ENCOUNTER — Other Ambulatory Visit: Payer: Self-pay

## 2019-10-04 DIAGNOSIS — F4321 Adjustment disorder with depressed mood: Secondary | ICD-10-CM | POA: Diagnosis not present

## 2019-10-04 MED ORDER — SERTRALINE HCL 25 MG PO TABS: 25.0000 mg | ORAL_TABLET | Freq: Every day | ORAL | 0 refills | Status: DC

## 2019-10-04 MED FILL — TRULICITY 1.5 MG/0.5 ML PEN: 1.5 | 28 days supply | Qty: 2 | Fill #2

## 2019-10-04 MED FILL — SERTRALINE HCL 25 MG TABLET: 25 | 90 days supply | Qty: 90 | Fill #0

## 2019-10-04 MED FILL — LOVASTATIN 40 MG TABS: 40 | 90 days supply | Qty: 90 | Fill #1

## 2019-10-04 NOTE — Progress Notes (Signed)
Virtual Visit via Video Note  I connected with Elizabeth [name] on 10/04/19 at 10:40 AM EST by a video enabled telemedicine application and verified that I am speaking with the correct person using two identifiers.  Location: Patient: home Provider: home   I discussed the limitations of evaluation and management by telemedicine and the availability of in person appointments. The patient expressed understanding and agreed to proceed.  History of Present Illness:  Patient is a 43 yr old female who presents today for follow up.  Last visit she was having insomnia and severe grief following the loss of her husband to COVID-19.  We wrote her out of work, had her continue to work with a Social worker and initiated [place].   She reports that she has met twice with her counselor since our last visit and is finding counseling to be "extremely helpful," She reports that she is also finding zoloft to be helpful.She reports that she did not tolerate the full 80m tab, "it felt like I couldn't get my emotions out."  As a result she dropped back down to 241mand has been feeling well on this dose.  She states she feels ready to return to work on 10/07/19.. Marland KitchenPHQ9 SCORE ONLY 10/04/2019 12/22/2018 01/21/2018  Score 5 10 0   Past Medical History:  Diagnosis Date  . Anemia   . Arthritis of left knee   . Astigmatism   . Diabetes mellitus without complication ([place]  . Dry mouth   . Fatigue   . GERD (gastroesophageal reflux disease)   . Heartburn   . High cholesterol   . Hypertension   . Keratoconjunctivitis   . Muscle pain   . Vitamin D deficiency   . Weakness      Social History   Socioeconomic History  . Marital status: Married    Spouse name: Elizabeth [name]. Number of children: 2  . Years of education: Not on file  . Highest education level: Not on file  Occupational History  . Occupation: CMA - [employer]  . Smoking status: Former Smoker    Years: 3.00   . Smokeless tobacco: Never Used  Substance and Sexual Activity  . Alcohol use: No  . Drug use: No  . Sexual activity: Yes    Partners: Male    Birth control/protection: Surgical, Implant  Other Topics Concern  . Not on file  Social History Narrative    2 children   1946son Elizabeth [name] 2000- son Ke[place]urance claims handlern [place] Enjoys reading   Widowed 06/30/02/2024husband died from COCOVID-26  Social Determinants of Health   Financial Resource Strain:   . Difficulty of Paying Living Expenses: Not on file  Food Insecurity:   . Worried About RuCharity fundraisern the Last Year: Not on file  . Ran Out of Food in the Last Year: Not on file  Transportation Needs:   . Lack of Transportation (Medical): Not on file  . Lack of Transportation (Non-Medical): Not on file  Physical Activity:   . Days of Exercise per Week: Not on file  . Minutes of Exercise per Session: Not on file  Stress:   . Feeling of Stress : Not on file  Social Connections:   . Frequency of Communication with Friends and Family: Not on file  . Frequency of Social Gatherings with Friends and Family: Not on  file  . Attends Religious Services: Not on file  . Active Member of Clubs or Organizations: Not on file  . Attends Club or Organization Meetings: Not on file  . Marital Status: Not on file  Intimate Partner Violence:   . Fear of Current or Ex-Partner: Not on file  . Emotionally Abused: Not on file  . Physically Abused: Not on file  . Sexually Abused: Not on file    Past Surgical History:  Procedure Laterality Date  . DILATION AND CURETTAGE OF UTERUS    . ENDOMETRIAL BIOPSY  12/19/2017   normal per pt  . TONSILLECTOMY    . TUBAL LIGATION      Family History  Problem Relation Age of Onset  . Diabetes Mother   . Hypertension Mother   . Hyperlipidemia Mother   . Obesity Mother   . Ovarian cancer Maternal Grandmother   . HIV Father        died from complications (IVDU)  .  Alcohol abuse Father   . Drug abuse Father     No Known Allergies  Current Outpatient Medications on File Prior to Visit  Medication Sig Dispense Refill  . albuterol (PROVENTIL HFA;VENTOLIN HFA) 108 (90 Base) MCG/ACT inhaler Inhale into the lungs.    . amLODipine (NORVASC) 10 MG tablet TAKE 1 TABLET (10 MG TOTAL) BY MOUTH DAILY. 30 tablet 5  . Continuous Blood Gluc Sensor MISC 1 each by Does not apply route as directed. Use as directed every 14 days. May dispense FreeStyle Libre Sensor System or similar. 1 each 0  . Dapagliflozin-metFORMIN HCl ER 02-999 MG TB24 Take by mouth.    . Dulaglutide 1.5 MG/0.5ML SOPN Inject into the skin.    . etonogestrel (NEXPLANON) 68 MG IMPL implant 1 each by Subdermal route once.    . lisinopril (ZESTRIL) 40 MG tablet TAKE 1 TABLET (40 MG TOTAL) BY MOUTH DAILY. 90 tablet 1  . lovastatin (MEVACOR) 40 MG tablet Take 1 tablet (40 mg total) by mouth at bedtime. 90 tablet 1  . meloxicam (MOBIC) 7.5 MG tablet Take 7.5 mg by mouth daily.    . norethindrone (MICRONOR) 0.35 MG tablet TAKE 1 TABLET (0.35 MG TOTAL) BY MOUTH DAILY. 28 tablet 11  . sertraline (ZOLOFT) 50 MG tablet 1/2 tab once daily for 1 week, then increase to a full tablet on week two 30 tablet 0  . Vitamin D, Ergocalciferol, (DRISDOL) 1.25 MG (50000 UT) CAPS capsule Take 1 capsule (50,000 Units total) by mouth every 7 (seven) days. 4 capsule 0   No current facility-administered medications on file prior to visit.    There were no vitals taken for this visit.      Observations/Objective:   Gen: Awake, alert, no acute distress Resp: Breathing is even and non-labored Psych: calm/pleasant demeanor Neuro: Alert and Oriented x 3, + facial symmetry, speech is clear.   Assessment and Plan:  Grief reaction- improving. Will continue zoloft 25mg once daily as well as counseling.  I think it is reasonable for her to return to work on 12/17.  Plan follow up in 3 months.   Follow Up Instructions:     I discussed the assessment and treatment plan with the patient. The patient was provided an opportunity to ask questions and all were answered. The patient agreed with the plan and demonstrated an understanding of the instructions.   The patient was advised to call back or seek an in-person evaluation if the symptoms worsen or if the condition   fails to improve as anticipated.  Melissa S O'Sullivan, NP   

## 2019-10-20 ENCOUNTER — Ambulatory Visit: Payer: 59 | Admitting: Family

## 2019-11-01 MED FILL — TRULICITY 1.5 MG/0.5 ML PEN: 1.5 | 28 days supply | Qty: 2 | Fill #0

## 2019-11-16 ENCOUNTER — Other Ambulatory Visit: Payer: Self-pay

## 2019-11-16 ENCOUNTER — Ambulatory Visit (INDEPENDENT_AMBULATORY_CARE_PROVIDER_SITE_OTHER): Payer: 59 | Admitting: Advanced Practice Midwife

## 2019-11-16 ENCOUNTER — Encounter: Payer: Self-pay | Admitting: Advanced Practice Midwife

## 2019-11-16 VITALS — BP 136/86 | HR 97 | Temp 97.4°F | Resp 16 | Ht 63.0 in | Wt 283.0 lb

## 2019-11-16 DIAGNOSIS — Z3046 Encounter for surveillance of implantable subdermal contraceptive: Secondary | ICD-10-CM | POA: Diagnosis not present

## 2019-11-16 MED ORDER — ETONOGESTREL 68 MG ~~LOC~~ IMPL
68.0000 mg | DRUG_IMPLANT | Freq: Once | SUBCUTANEOUS | Status: AC
  Administered 2019-11-16: 68 mg via SUBCUTANEOUS

## 2019-11-16 NOTE — Progress Notes (Signed)
History:  Ms. Elizabeth [name] is a 44 y.o. G2P2002 who presents to clinic today to have Nexplanon evaluated. She reports that last month her period was heavier than normal and then last week she noticed that it felt as if her Nexplanon is broken into 2 pieces.   The following portions of the patient's history were reviewed and updated as appropriate: allergies, current medications, family history, past medical history, social history, past surgical history and problem list.  Review of Systems:  Review of Systems  Constitutional: Negative.   Genitourinary: Negative.   Neurological: Negative.   Psychiatric/Behavioral: Negative.     Objective:  Physical Exam BP 136/86   Pulse 97   Temp (!) 97.4 F (36.3 C)   Resp 16   Ht 5\' 3"  (1.6 m)   Wt 128.4 kg   BMI 50.13 kg/m  Physical Exam Vitals reviewed.  Cardiovascular:     Rate and Rhythm: Normal rate.  Pulmonary:     Effort: Pulmonary effort is normal.  Skin:    General: Skin is warm and dry.  Neurological:     General: No focal deficit present.     Mental Status: She is alert and oriented to person, place, and time.  Psychiatric:        Mood and Affect: Mood normal.        Behavior: Behavior normal.        Thought Content: Thought content normal.        Judgment: Judgment normal.    Labs and Imaging     Nexplanon Removal and Insertion  Patient identified, informed consent performed, consent signed.  Patient does understand that irregular bleeding is a very common side effect of this medication. She was advised to have backup contraception for one week after replacement of the implant. Appropriate time out taken. Nexplanon site identified. Able to palpate 2 separate pieces of Nexplanon. Area prepped in usual sterile fashon. 3 ml of 1% lidocaine was used to anesthetize the area at the distal end of the implant. A small stab incision was made right beside the implant on the distal portion. The first piece of the Nexplanon rod  was grasped using hemostats and removed.The original incision was then enlarged with the scalpel. Using fingers, light pressure was applied to the proximal end of the Nexplanon and was able to move Nexplanon closer to original incision. The second piece of the Nexplanon was then grasped with the hemostats and removed without difficulty. Nexplanon was inspected and appears to have been removed in its entirety. See photo above.There was minimal blood loss. There were no complications. Area was then re-prepped. Nexplanon removed from packaging. Device confirmed in needle, then inserted full length of needle and withdrawn per handbook instructions. Nexplanon was able to palpated in the patient's arm; patient palpated the insert herself. There was minimal blood loss. Patient insertion site covered with guaze and a pressure bandage to reduce any bruising. The patient tolerated the procedure well and was given post procedure instructions. She was advised to have backup contraception for one week.    Lot # [id number] Expiration Date: 10/27/2021   Assessment & Plan:  1. Encounter for removal and reinsertion of Nexplanon - Removed original Nexplanon in its entirety.  - Nexplanon replaced without difficulty.  - Follow up as needed.    Maryagnes Amos, SNM 11/16/2019 3:11 PM

## 2019-11-18 DIAGNOSIS — E7800 Pure hypercholesterolemia, unspecified: Secondary | ICD-10-CM | POA: Diagnosis not present

## 2019-11-18 DIAGNOSIS — E559 Vitamin D deficiency, unspecified: Secondary | ICD-10-CM | POA: Diagnosis not present

## 2019-11-18 DIAGNOSIS — E119 Type 2 diabetes mellitus without complications: Secondary | ICD-10-CM | POA: Diagnosis not present

## 2019-11-18 MED FILL — LISINOPRIL 40 MG TABLET: 40 | 90 days supply | Qty: 90 | Fill #1

## 2019-11-23 DIAGNOSIS — Z6841 Body Mass Index (BMI) 40.0 and over, adult: Secondary | ICD-10-CM | POA: Diagnosis not present

## 2019-11-23 DIAGNOSIS — I10 Essential (primary) hypertension: Secondary | ICD-10-CM | POA: Diagnosis not present

## 2019-11-23 DIAGNOSIS — Z634 Disappearance and death of family member: Secondary | ICD-10-CM | POA: Diagnosis not present

## 2019-11-23 DIAGNOSIS — E119 Type 2 diabetes mellitus without complications: Secondary | ICD-10-CM | POA: Diagnosis not present

## 2019-12-01 MED FILL — TRULICITY 1.5 MG/0.5 ML PEN: 1.5 | 28 days supply | Qty: 2 | Fill #1

## 2019-12-06 ENCOUNTER — Encounter: Payer: Self-pay | Admitting: Family

## 2019-12-08 ENCOUNTER — Other Ambulatory Visit: Payer: Self-pay | Admitting: Family

## 2019-12-08 MED FILL — AMLODIPINE BESYLATE 10 MG T: 10 | 90 days supply | Qty: 90 | Fill #0

## 2019-12-10 MED FILL — XIGDUO XR 5 MG-1,000 MG TAB: 5-1000 | 90 days supply | Qty: 180 | Fill #0

## 2019-12-14 ENCOUNTER — Encounter: Payer: Self-pay | Admitting: Family

## 2019-12-24 MED FILL — TRULICITY 1.5 MG/0.5 ML PEN: 1.5 | 28 days supply | Qty: 2 | Fill #2

## 2020-01-04 ENCOUNTER — Ambulatory Visit: Payer: 59 | Admitting: Family

## 2020-01-14 ENCOUNTER — Other Ambulatory Visit: Payer: Self-pay | Admitting: Family

## 2020-01-14 MED FILL — LOVASTATIN 40 MG TABS: 40 | 90 days supply | Qty: 90 | Fill #0

## 2020-01-25 MED FILL — TRULICITY 1.5 MG/0.5 ML PEN: 1.5 | 28 days supply | Qty: 2 | Fill #3

## 2020-02-14 ENCOUNTER — Other Ambulatory Visit: Payer: Self-pay | Admitting: Family

## 2020-02-14 MED FILL — LISINOPRIL 40 MG TABLET: 40 | 90 days supply | Qty: 90 | Fill #0

## 2020-02-17 ENCOUNTER — Telehealth: Payer: Self-pay | Admitting: *Deleted

## 2020-02-17 MED ORDER — NORETHINDRONE 0.35 MG PO TABS: 1.0000 | ORAL_TABLET | Freq: Every day | ORAL | 0 refills | Status: DC

## 2020-02-17 MED FILL — NORETHINDRONE 0.35 MG TABS: 0.35 | 28 days supply | Qty: 28 | Fill #0

## 2020-02-17 NOTE — Telephone Encounter (Signed)
Pt called stating that she has been bleeding now for 14 days.  She has the Nexplanon that was replaced about 2 months ago.  She had this happen when she had her last Nexplanon and was given Micronor to help with the bleeding.  She is requesting a RX for 1 month of Micronor.

## 2020-02-22 ENCOUNTER — Other Ambulatory Visit (HOSPITAL_BASED_OUTPATIENT_CLINIC_OR_DEPARTMENT_OTHER): Payer: Self-pay | Admitting: Internal Medicine

## 2020-02-22 MED FILL — TRULICITY 1.5 MG/0.5 ML PEN: 1.5 | 28 days supply | Qty: 2 | Fill #0

## 2020-03-10 MED FILL — XIGDUO XR 5 MG-1,000 MG TAB: 5-1000 | 90 days supply | Qty: 180 | Fill #0

## 2020-03-14 ENCOUNTER — Telehealth (INDEPENDENT_AMBULATORY_CARE_PROVIDER_SITE_OTHER): Payer: 59 | Admitting: Family

## 2020-03-14 ENCOUNTER — Encounter: Payer: Self-pay | Admitting: Family

## 2020-03-14 ENCOUNTER — Other Ambulatory Visit: Payer: Self-pay

## 2020-03-14 DIAGNOSIS — F418 Other specified anxiety disorders: Secondary | ICD-10-CM

## 2020-03-14 DIAGNOSIS — F4321 Adjustment disorder with depressed mood: Secondary | ICD-10-CM | POA: Diagnosis not present

## 2020-03-14 MED ORDER — ESCITALOPRAM OXALATE 10 MG PO TABS: ORAL_TABLET | ORAL | 0 refills | Status: DC

## 2020-03-14 MED FILL — ESCITALOPRAM 10 MG TABLET: 10 | 30 days supply | Qty: 30 | Fill #0

## 2020-03-14 NOTE — Progress Notes (Signed)
Virtual Visit via Video Note  I connected with@ on 03/14/20 at  4:40 PM EDT by a video enabled telemedicine application and verified that I am speaking with the correct person using two identifiers. This visit type was conducted due to national recommendations for restrictions regarding the COVID-19 Pandemic (e.g. social distancing).  This format is felt to be most appropriate for this patient at this time.   I discussed the limitations of evaluation and management by telemedicine and the availability of in person appointments. The patient expressed understanding and agreed to proceed.  Only the patient and myself were on today's video visit. The patient was at home and I was in my office at the time of today's visit.   History of Present Illness:  Patient is a 44 year old female who presents today to discuss anxiety and depression.  We last saw her back in December 2020 following along her husband to COVID-19.  At that time, she was on Zoloft 25 mg once daily.  She felt like the Zoloft was helpful as well as counseling.  She states she was unable to tolerate the full 50 mg of Zoloft and as a result continued at 25 mg.  Today she reports that she continue the 25 mg for few months, but ultimately discontinued the medication due to having difficulty "letting my emotions out."  Today however, she reports that she is really struggling.  She states she is having a lot of anxiety symptoms.  Even going for a walk or going to the store can cause her anxiety. Reports that she is emotionally labile, "crying all the time."  She states that she wants to isolate herself all the time. Wakes up crying more.  She is using melatonin which helps her to get "at least 5 hours of sleep." Has had to leave work a few times because she can't concentrate.  She does admit that when she was on the Zoloft previously she was not having as many crying spells and was better able to get through her work day.  She is doing counseling  through [facility].  She continues to find this helpful.  However she notes that she will do well for a few days following counseling treatment but then feels like she "goes back to my old ways."  Observations/Objective:   Gen: Awake, alert, no acute distress but looks tired Resp: Breathing is even and non-labored Psych: calm/pleasant demeanor- tearful, sad appearing Neuro: Alert and Oriented x 3, + facial symmetry, speech is clear.  Assessment and Plan:  Anxiety/depression/Greif reaction-Uncontrolled. we discussed the various stages of grief.  I advised her that I think it would be most helpful for her to get back on medication.  We did discuss that most of the SSRIs will cause some blunting of emotions.  However since she is really having difficulty functioning with her day-to-day afraid that this may be a bit of a trade off in order to help her get back into her routine.  She understands this and would like to try different medication.  Will initiate Lexapro 10 mg. I instructed pt to start 1/2 tablet once daily for 1 week and then increase to a full tablet once daily on week two as tolerated.Plan follow up in 3 weeks to evaluate progress.      Follow Up Instructions:    I discussed the assessment and treatment plan with the patient. The patient was provided an opportunity to ask questions and all were answered. The patient agreed with  the plan and demonstrated an understanding of the instructions.   The patient was advised to call back or seek an in-person evaluation if the symptoms worsen or if the condition fails to improve as anticipated.    [name], NP

## 2020-03-15 MED FILL — AMLODIPINE BESYLATE 10 MG T: 10 | 90 days supply | Qty: 90 | Fill #0

## 2020-03-17 ENCOUNTER — Ambulatory Visit: Payer: 59 | Admitting: Family

## 2020-03-17 ENCOUNTER — Encounter: Payer: Self-pay | Admitting: Family

## 2020-03-22 ENCOUNTER — Other Ambulatory Visit (HOSPITAL_BASED_OUTPATIENT_CLINIC_OR_DEPARTMENT_OTHER): Payer: Self-pay | Admitting: Internal Medicine

## 2020-03-22 DIAGNOSIS — Z634 Disappearance and death of family member: Secondary | ICD-10-CM | POA: Diagnosis not present

## 2020-03-22 DIAGNOSIS — I10 Essential (primary) hypertension: Secondary | ICD-10-CM | POA: Diagnosis not present

## 2020-03-22 DIAGNOSIS — Z6841 Body Mass Index (BMI) 40.0 and over, adult: Secondary | ICD-10-CM | POA: Diagnosis not present

## 2020-03-22 DIAGNOSIS — E119 Type 2 diabetes mellitus without complications: Secondary | ICD-10-CM | POA: Diagnosis not present

## 2020-03-22 MED FILL — TRULICITY 1.5 MG/0.5 ML PEN: 1.5 | 28 days supply | Qty: 2 | Fill #0

## 2020-04-04 ENCOUNTER — Ambulatory Visit: Payer: 59 | Admitting: Family

## 2020-04-04 ENCOUNTER — Other Ambulatory Visit: Payer: Self-pay

## 2020-04-04 ENCOUNTER — Encounter: Payer: Self-pay | Admitting: Family

## 2020-04-04 VITALS — BP 123/78 | HR 110 | Temp 97.4°F | Resp 16 | Ht 63.0 in | Wt 276.0 lb

## 2020-04-04 DIAGNOSIS — F329 Major depressive disorder, single episode, unspecified: Secondary | ICD-10-CM | POA: Diagnosis not present

## 2020-04-04 DIAGNOSIS — F32A Depression, unspecified: Secondary | ICD-10-CM

## 2020-04-04 MED ORDER — ESCITALOPRAM OXALATE 10 MG PO TABS: 10.0000 mg | ORAL_TABLET | Freq: Every day | ORAL | 1 refills | Status: DC

## 2020-04-04 NOTE — Patient Instructions (Signed)
You can try going up to 10mg  on the lexapro- if you tolerate this dose please come back and see me in 6 months. If you have increased nausea on 10mg , you can stay at the 5mg  and see me back in 3 months.

## 2020-04-04 NOTE — Progress Notes (Signed)
Subjective:    Patient ID: Elizabeth [name], female    DOB: October 05, 1976, 44 y.o.   MRN: [id number]  HPI  Patient is a 44 yr old female who presents today for follow up of her depression and anxiety.  She continues to work.  Much less tearful.  She reports that she still wakes up 1-2 times a night.  Mind wanders when she lays down at night. It takes her 30+ minutes to fall asleep. She is concentrating a bit better at work.   She is continuing to work with her counselor and is finding this helpful. Her appetite is "not too good." Eating 2 meals/day. Has Ensure for breakfast.   Wt Readings from Last 3 Encounters:  04/04/20 276 lb (125.2 kg)  11/16/19 283 lb (128.4 kg)  06/30/19 290 lb (131.5 kg)      Review of Systems Past Medical History:  Diagnosis Date  . Anemia   . Arthritis of left knee   . Astigmatism   . Diabetes mellitus without complication ([place])   . Dry mouth   . Fatigue   . GERD (gastroesophageal reflux disease)   . Heartburn   . High cholesterol   . Hypertension   . Keratoconjunctivitis   . Muscle pain   . Vitamin D deficiency   . Weakness      Social History   Socioeconomic History  . Marital status: Married    Spouse name: [name]  . Number of children: 2  . Years of education: Not on file  . Highest education level: Not on file  Occupational History  . Occupation: CMA - Economist: [employer]  Tobacco Use  . Smoking status: Former Smoker    Years: 3.00  . Smokeless tobacco: Never Used  Vaping Use  . Vaping Use: Never used  Substance and Sexual Activity  . Alcohol use: No  . Drug use: No  . Sexual activity: Yes    Partners: Male    Birth control/protection: Surgical, Implant  Other Topics Concern  . Not on file  Social History Narrative    2 children   68- son Allen Kell   2000- son Insurance claims handler in [place] in [place]   Enjoys reading   Widowed 2023/08/01, husband died from COVID-26.   Social Determinants  of Health   Financial Resource Strain:   . Difficulty of Paying Living Expenses:   Food Insecurity:   . Worried About Charity fundraiser in the Last Year:   . Arboriculturist in the Last Year:   Transportation Needs:   . Film/video editor (Medical):   [name] Lack of Transportation (Non-Medical):   Physical Activity:   . Days of Exercise per Week:   . Minutes of Exercise per Session:   Stress:   . Feeling of Stress :   Social Connections:   . Frequency of Communication with Friends and Family:   . Frequency of Social Gatherings with Friends and Family:   . Attends Religious Services:   . Active Member of Clubs or Organizations:   . Attends Archivist Meetings:   [name] Marital Status:   Intimate Partner Violence:   . Fear of Current or Ex-Partner:   . Emotionally Abused:   [name] Physically Abused:   . Sexually Abused:     Past Surgical History:  Procedure Laterality Date  . DILATION AND CURETTAGE OF UTERUS    . ENDOMETRIAL BIOPSY  12/19/2017  normal per pt  . TONSILLECTOMY    . TUBAL LIGATION      Family History  Problem Relation Age of Onset  . Diabetes Mother   . Hypertension Mother   . Hyperlipidemia Mother   . Obesity Mother   . Ovarian cancer Maternal Grandmother   . HIV Father        died from complications (IVDU)  . Alcohol abuse Father   . Drug abuse Father     No Known Allergies  Current Outpatient Medications on File Prior to Visit  Medication Sig Dispense Refill  . amLODipine (NORVASC) 10 MG tablet TAKE 1 TABLET (10 MG TOTAL) BY MOUTH DAILY. 90 tablet 5  . Continuous Blood Gluc Sensor MISC 1 each by Does not apply route as directed. Use as directed every 14 days. May dispense FreeStyle [employer] or similar. 1 each 0  . Dapagliflozin-metFORMIN HCl ER 02-999 MG TB24 Take by mouth.    . Dulaglutide 1.5 MG/0.5ML SOPN Inject into the skin.    [name] escitalopram (LEXAPRO) 10 MG tablet 1/2 tablet by mouth once daily for 1 week, then increase to a  full tablet once daily on week two 30 tablet 0  . lisinopril (ZESTRIL) 40 MG tablet TAKE 1 TABLET BY MOUTH ONCE DAILY 90 tablet 1  . lovastatin (MEVACOR) 40 MG tablet TAKE 1 TABLET BY MOUTH AT BEDTIME 90 tablet 0  . meloxicam (MOBIC) 7.5 MG tablet Take 7.5 mg by mouth daily.     No current facility-administered medications on file prior to visit.    BP 123/78 (BP Location: Right Arm, Patient Position: Sitting, Cuff Size: Large)   Pulse (!) 110   Temp (!) 97.4 F (36.3 C) (Temporal)   Resp 16   Ht 5\' 3"  (1.6 m)   Wt 276 lb (125.2 kg)   SpO2 100%   BMI 48.89 kg/m       Objective:   Physical Exam Constitutional:      General: She is not in acute distress.    Appearance: Normal appearance.  Neurological:     Mental Status: She is alert and oriented to person, place, and time.  Psychiatric:        Attention and Perception: Attention normal.        Mood and Affect: Mood normal.        Speech: Speech normal.        Behavior: Behavior normal.        Cognition and Memory: Cognition normal.           Assessment & Plan:  Depression-her symptoms are starting Lexapro 5 mg once daily.  She reports that she does not have the feeling of loss of emotion that she had experienced when she was taking sertraline.  She is much happier with the Lexapro.  She does still have some mild nausea.  I have encouraged her to continue her work with her counselor.  I have also advised her as follows:  You can try going up to 10mg  on the lexapro- if you tolerate this dose please come back and see me in 6 months. If you have increased nausea on 10mg , you can stay at the 5mg  and see me back in 3 months.  15 minutes spent on today's visit.  The majority of this time was spent counseling the patient.  This visit occurred during the SARS-CoV-2 public health emergency.  Safety protocols were in place, including screening questions prior to the visit, additional usage of  staff PPE, and extensive cleaning of  exam room while observing appropriate contact time as indicated for disinfecting solutions.

## 2020-04-07 MED FILL — ESCITALOPRAM 10 MG TABLET: 10 | 90 days supply | Qty: 90 | Fill #0

## 2020-04-10 ENCOUNTER — Other Ambulatory Visit (HOSPITAL_BASED_OUTPATIENT_CLINIC_OR_DEPARTMENT_OTHER): Payer: Self-pay | Admitting: Family

## 2020-04-10 ENCOUNTER — Encounter (HOSPITAL_BASED_OUTPATIENT_CLINIC_OR_DEPARTMENT_OTHER): Payer: 59

## 2020-04-10 ENCOUNTER — Encounter (HOSPITAL_BASED_OUTPATIENT_CLINIC_OR_DEPARTMENT_OTHER): Payer: Self-pay

## 2020-04-10 DIAGNOSIS — Z1231 Encounter for screening mammogram for malignant neoplasm of breast: Secondary | ICD-10-CM

## 2020-04-18 ENCOUNTER — Encounter: Payer: 59 | Admitting: Family

## 2020-04-19 ENCOUNTER — Ambulatory Visit: Payer: 59 | Admitting: Medical-Surgical

## 2020-04-19 ENCOUNTER — Other Ambulatory Visit: Payer: Self-pay | Admitting: Family

## 2020-04-19 MED FILL — TRULICITY 1.5 MG/0.5 ML PEN: 1.5 | 28 days supply | Qty: 2 | Fill #1

## 2020-04-19 MED FILL — ESCITALOPRAM 10 MG TABLET: 10 | 90 days supply | Qty: 90 | Fill #0

## 2020-04-19 MED FILL — LOVASTATIN 40 MG TABS: 40 | 90 days supply | Qty: 90 | Fill #0

## 2020-04-25 ENCOUNTER — Ambulatory Visit (HOSPITAL_BASED_OUTPATIENT_CLINIC_OR_DEPARTMENT_OTHER)
Admission: RE | Admit: 2020-04-25 | Discharge: 2020-04-25 | Disposition: A | Discharge status: Home / Self Care | Payer: 59 | Source: Ambulatory Visit | Attending: Family | Admitting: Family

## 2020-04-25 ENCOUNTER — Other Ambulatory Visit: Payer: Self-pay

## 2020-04-25 DIAGNOSIS — Z1231 Encounter for screening mammogram for malignant neoplasm of breast: Secondary | ICD-10-CM | POA: Diagnosis not present

## 2020-05-15 MED FILL — TRULICITY 1.5 MG/0.5 ML PEN: 1.5 | 28 days supply | Qty: 2 | Fill #2

## 2020-05-19 MED FILL — LISINOPRIL 40 MG TABS: 40 | 90 days supply | Qty: 90 | Fill #0

## 2020-05-23 ENCOUNTER — Telehealth: Payer: Self-pay | Admitting: Psychiatry

## 2020-05-31 ENCOUNTER — Encounter: Payer: Self-pay | Admitting: Family

## 2020-06-08 MED FILL — XIGDUO XR 5 MG-1,000 MG TAB: 5-1000 | 90 days supply | Qty: 180 | Fill #1

## 2020-06-08 MED FILL — TRULICITY 1.5 MG/0.5 ML PEN: 1.5 | 28 days supply | Qty: 2 | Fill #3

## 2020-06-08 MED FILL — AMLODIPINE BESYLATE 10 MG T: 10 | 90 days supply | Qty: 90 | Fill #1

## 2020-06-20 ENCOUNTER — Encounter: Payer: Self-pay | Admitting: Family

## 2020-06-27 ENCOUNTER — Encounter: Payer: Self-pay | Admitting: Family

## 2020-07-10 ENCOUNTER — Encounter: Payer: Self-pay | Admitting: Family

## 2020-07-10 ENCOUNTER — Other Ambulatory Visit: Payer: Self-pay

## 2020-07-10 ENCOUNTER — Telehealth: Payer: Self-pay | Admitting: Family

## 2020-07-10 ENCOUNTER — Telehealth (INDEPENDENT_AMBULATORY_CARE_PROVIDER_SITE_OTHER): Payer: 59 | Admitting: Family

## 2020-07-10 ENCOUNTER — Other Ambulatory Visit: Payer: Self-pay | Admitting: Family

## 2020-07-10 DIAGNOSIS — I10 Essential (primary) hypertension: Secondary | ICD-10-CM | POA: Diagnosis not present

## 2020-07-10 DIAGNOSIS — E785 Hyperlipidemia, unspecified: Secondary | ICD-10-CM

## 2020-07-10 DIAGNOSIS — F32A Depression, unspecified: Secondary | ICD-10-CM

## 2020-07-10 DIAGNOSIS — E1165 Type 2 diabetes mellitus with hyperglycemia: Secondary | ICD-10-CM | POA: Diagnosis not present

## 2020-07-10 DIAGNOSIS — F329 Major depressive disorder, single episode, unspecified: Secondary | ICD-10-CM | POA: Diagnosis not present

## 2020-07-10 MED ORDER — ESCITALOPRAM OXALATE 5 MG PO TABS
5.0000 mg | ORAL_TABLET | Freq: Every day | ORAL | 1 refills | Status: DC
Start: 2020-07-10 — End: 2020-08-01

## 2020-07-10 MED ORDER — AMLODIPINE BESYLATE 10 MG PO TABS: 10.0000 mg | ORAL_TABLET | Freq: Every day | ORAL | 1 refills | Status: DC

## 2020-07-10 MED ORDER — LISINOPRIL 40 MG PO TABS: 40.0000 mg | ORAL_TABLET | Freq: Every day | ORAL | 1 refills | Status: DC

## 2020-07-10 MED FILL — ESCITALOPRAM 5 MG TABLET: 5 | 90 days supply | Qty: 90 | Fill #0

## 2020-07-10 MED FILL — TRULICITY 1.5 MG/0.5 ML PEN: 1.5 | 28 days supply | Qty: 2 | Fill #4

## 2020-07-10 NOTE — Telephone Encounter (Signed)
Opened in error

## 2020-07-10 NOTE — Progress Notes (Signed)
Virtual Visit via Video Note  I connected with Elizabeth [name] on 07/10/20 at  9:00 AM EDT by a video enabled telemedicine application and verified that I am speaking with the correct person using two identifiers.  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 The patient expressed understanding and agreed to proceed. Only the patient and myself were present for today's video call.   History of Present Illness:  Patient is a 44 yr old female who presents today for follow up of her depression. She is currently taking lexapro 5mg  once daily. States that she is "doing so much better."  States that she is sleeping better.  Reports good concentration at work. She is maintained on lexapro 5mg .    Today's weight is 272 lb. She is working on weight loss.   Wt Readings from Last 3 Encounters:  04/04/20 276 lb (125.2 kg)  11/16/19 283 lb (128.4 kg)  06/30/19 290 lb (131.5 kg)   DM2- sugar was 83 on Saturday. She sees Dr. Meredith Pel ([facility]).   Lab Results  Component Value Date   HGBA1C 7.3 (H) 12/22/2018   HGBA1C 7.8 12/31/2017   Lab Results  Component Value Date   LDLCALC 96 12/22/2018   CREATININE 0.58 12/22/2018   HTN- maintained on lisinopril 40mg .   BP Readings from Last 3 Encounters:  04/04/20 123/78  11/16/19 136/86  04/30/19 124/68   Hyperlipidemia- maintained on lovastatin.      Observations/Objective:   Gen: Awake, alert, no acute distress Resp: Breathing is even and non-labored Psych: calm/pleasant demeanor Neuro: Alert and Oriented x 3, + facial symmetry, speech is clear.   Assessment and Plan:  Depression- stable/improved. Continue current dose of lexapro.  DM2- she will return for A1C. Management per endocrinology.  Hyperlipidemia- obtain follow up lipid panel. Continue mevacor.    HTN- she will check her blood pressure this AM and  send me her reading via mychart.  Continue amlodipine and lisinopril.  Follow Up Instructions:    I discussed the assessment and treatment plan with the patient. The patient was provided an opportunity to ask questions and all were answered. The patient agreed with the plan and demonstrated an understanding of the instructions.   The patient was advised to call back or seek an in-person evaluation if the symptoms worsen or if the condition fails to improve as anticipated.  [name], NP

## 2020-07-12 NOTE — Addendum Note (Signed)
Addended by: Kelle Darting A on: 07/12/2020 03:25 PM   Modules accepted: Orders

## 2020-07-14 ENCOUNTER — Other Ambulatory Visit: Payer: Self-pay

## 2020-07-14 ENCOUNTER — Other Ambulatory Visit: Payer: 59

## 2020-07-14 DIAGNOSIS — E1165 Type 2 diabetes mellitus with hyperglycemia: Secondary | ICD-10-CM

## 2020-07-14 DIAGNOSIS — E785 Hyperlipidemia, unspecified: Secondary | ICD-10-CM | POA: Diagnosis not present

## 2020-07-15 LAB — COMPREHENSIVE METABOLIC PANEL
AG Ratio: 1.5 (calc) (ref 1.0–2.5)
ALT: 15 U/L (ref 6–29)
AST: 12 U/L (ref 10–30)
Albumin: 4 g/dL (ref 3.6–5.1)
Alkaline phosphatase (APISO): 98 U/L (ref 31–125)
BUN/Creatinine Ratio: 8 (calc) (ref 6–22)
BUN: 5 mg/dL — ABNORMAL LOW (ref 7–25)
CO2: 23 mmol/L (ref 20–32)
Calcium: 9 mg/dL (ref 8.6–10.2)
Chloride: 104 mmol/L (ref 98–110)
Creat: 0.59 mg/dL (ref 0.50–1.10)
Globulin: 2.7 g/dL (calc) (ref 1.9–3.7)
Glucose, Bld: 84 mg/dL (ref 65–99)
Potassium: 4.2 mmol/L (ref 3.5–5.3)
Sodium: 137 mmol/L (ref 135–146)
Total Bilirubin: 0.3 mg/dL (ref 0.2–1.2)
Total Protein: 6.7 g/dL (ref 6.1–8.1)

## 2020-07-15 LAB — LIPID PANEL
Cholesterol: 147 mg/dL (ref <200)
HDL: 48 mg/dL — ABNORMAL LOW (ref >=50)
LDL Cholesterol (Calc): 85 mg/dL (calc)
Non-HDL Cholesterol (Calc): 99 mg/dL (calc) (ref <130)
Total CHOL/HDL Ratio: 3.1 (calc) (ref <5.0)
Triglycerides: 68 mg/dL (ref <150)

## 2020-07-15 LAB — HEMOGLOBIN A1C
Hgb A1c MFr Bld: 6.6 % of total Hgb — ABNORMAL HIGH (ref <5.7)
Mean Plasma Glucose: 143 (calc)
eAG (mmol/L): 7.9 (calc)

## 2020-07-26 MED FILL — LOVASTATIN 40 MG TABS: 40 | 90 days supply | Qty: 90 | Fill #1

## 2020-07-31 ENCOUNTER — Encounter: Payer: Self-pay | Admitting: Family

## 2020-08-01 ENCOUNTER — Other Ambulatory Visit: Payer: Self-pay | Admitting: Family

## 2020-08-01 MED ORDER — ESCITALOPRAM OXALATE 10 MG PO TABS
10.0000 mg | ORAL_TABLET | Freq: Every day | ORAL | 1 refills | Status: DC
Start: 2020-08-01 — End: 2020-09-04

## 2020-08-01 MED ORDER — TRAZODONE HCL 50 MG PO TABS
ORAL_TABLET | ORAL | 1 refills | Status: DC
Start: 2020-08-01 — End: 2020-09-04

## 2020-08-02 MED FILL — traZODone HCL 50 MG TABS: 50 | 30 days supply | Qty: 30 | Fill #0

## 2020-08-02 MED FILL — ESCITALOPRAM 10 MG TABLET: 10 | 30 days supply | Qty: 30 | Fill #0

## 2020-08-07 MED FILL — TRULICITY 1.5 MG/0.5 ML PEN: 1.5 | 28 days supply | Qty: 2 | Fill #5

## 2020-08-16 DIAGNOSIS — Z6841 Body Mass Index (BMI) 40.0 and over, adult: Secondary | ICD-10-CM | POA: Diagnosis not present

## 2020-08-16 DIAGNOSIS — E119 Type 2 diabetes mellitus without complications: Secondary | ICD-10-CM | POA: Diagnosis not present

## 2020-08-16 DIAGNOSIS — Z634 Disappearance and death of family member: Secondary | ICD-10-CM | POA: Diagnosis not present

## 2020-08-16 DIAGNOSIS — I10 Essential (primary) hypertension: Secondary | ICD-10-CM | POA: Diagnosis not present

## 2020-08-21 MED FILL — LISINOPRIL 40 MG TABS: 40 | 90 days supply | Qty: 90 | Fill #0

## 2020-09-04 ENCOUNTER — Encounter: Payer: Self-pay | Admitting: Family

## 2020-09-04 ENCOUNTER — Telehealth (INDEPENDENT_AMBULATORY_CARE_PROVIDER_SITE_OTHER): Payer: 59 | Admitting: Family

## 2020-09-04 ENCOUNTER — Other Ambulatory Visit: Payer: Self-pay

## 2020-09-04 ENCOUNTER — Other Ambulatory Visit: Payer: Self-pay | Admitting: Family

## 2020-09-04 VITALS — Ht 63.0 in | Wt 277.0 lb

## 2020-09-04 DIAGNOSIS — G4700 Insomnia, unspecified: Secondary | ICD-10-CM

## 2020-09-04 DIAGNOSIS — F418 Other specified anxiety disorders: Secondary | ICD-10-CM

## 2020-09-04 MED ORDER — ESCITALOPRAM OXALATE 10 MG PO TABS: 10.0000 mg | ORAL_TABLET | Freq: Every day | ORAL | 1 refills | Status: DC

## 2020-09-04 MED ORDER — TRAZODONE HCL 50 MG PO TABS: ORAL_TABLET | ORAL | 0 refills | Status: DC

## 2020-09-04 MED FILL — traZODone HCL 50 MG TABS: 50 | 90 days supply | Qty: 90 | Fill #0

## 2020-09-04 MED FILL — ESCITALOPRAM 10 MG TABLET: 10 | 90 days supply | Qty: 90 | Fill #0

## 2020-09-04 MED FILL — TRULICITY 1.5 MG/0.5 ML PEN: 1.5 | 28 days supply | Qty: 2 | Fill #1

## 2020-09-04 NOTE — Progress Notes (Signed)
Virtual Visit via Video Note  I connected with Elizabeth [name] on 09/04/20 at 10:00 AM EST by a video enabled telemedicine application and verified that I am speaking with the correct person using two identifiers. We had issues with audio and the visit was transitioned to the telephone.  Location: Patient: home Provider: work   I discussed the limitations of evaluation and management by telemedicine and the availability of in person appointments. The patient expressed understanding and agreed to proceed. Only the patient and myself were present for today's video call.   History of Present Illness:  Patient is a 44 yr old female who presents today for follow up of her depression. Since her last visit in September she noted increased crying spells and panic attacks. Lexapro was  Increased from 5mg  to 10mg  once daily.  She reports that since she increased lexapro she has been feeling better. Still having crying spells but they are much less frequent. She plans to schedule with a counselor.  We also added trazodone last visit. She is currently taking 25mg  of trazodone at night and notes that she is sleeping well and sleeping straight through the night.  . She was having crying spells, panic attacks, crying more. States that she is doing better. Fewer crying spells.  Sleeping straight through the night. Not waking up at all.    Wt Readings from Last 3 Encounters:  09/04/20 277 lb (125.6 kg)  04/04/20 276 lb (125.2 kg)  11/16/19 283 lb (128.4 kg)     Past Medical History:  Diagnosis Date  . Anemia   . Arthritis of left knee   . Astigmatism   . Diabetes mellitus without complication ([place])   . Dry mouth   . Fatigue   . GERD (gastroesophageal reflux disease)   . Heartburn   . High cholesterol   . Hypertension   . Keratoconjunctivitis   . Muscle pain   . Vitamin D deficiency   . Weakness      Social History   Socioeconomic History  . Marital status: Married    Spouse name: [name]  .  Number of children: 2  . Years of education: Not on file  . Highest education level: Not on file  Occupational History  . Occupation: CMA - Economist: [employer]  Tobacco Use  . Smoking status: Former Smoker    Years: 3.00  . Smokeless tobacco: Never Used  Vaping Use  . Vaping Use: Never used  Substance and Sexual Activity  . Alcohol use: No  . Drug use: No  . Sexual activity: Yes    Partners: Male    Birth control/protection: Surgical, Implant  Other Topics Concern  . Not on file  Social History Narrative    2 children   50- son Allen Kell   2000- son Insurance claims handler in [place] in [place]   Enjoys reading   Widowed 07/31/2023, husband died from COVID-81.   Social Determinants of Health   Financial Resource Strain:   . Difficulty of Paying Living Expenses: Not on file  Food Insecurity:   . Worried About Charity fundraiser in the Last Year: Not on file  . Ran Out of Food in the Last Year: Not on file  Transportation Needs:   . Lack of Transportation (Medical): Not on file  . Lack of Transportation (Non-Medical): Not on file  Physical Activity:   . Days of Exercise per Week: Not on file  .  Minutes of Exercise per Session: Not on file  Stress:   . Feeling of Stress : Not on file  Social Connections:   . Frequency of Communication with Friends and Family: Not on file  . Frequency of Social Gatherings with Friends and Family: Not on file  . Attends Religious Services: Not on file  . Active Member of Clubs or Organizations: Not on file  . Attends Archivist Meetings: Not on file  . Marital Status: Not on file  Intimate Partner Violence:   . Fear of Current or Ex-Partner: Not on file  . Emotionally Abused: Not on file  . Physically Abused: Not on file  . Sexually Abused: Not on file    Past Surgical History:  Procedure Laterality Date  . DILATION AND CURETTAGE OF UTERUS    . ENDOMETRIAL BIOPSY  12/19/2017   normal per  pt  . TONSILLECTOMY    . TUBAL LIGATION      Family History  Problem Relation Age of Onset  . Diabetes Mother   . Hypertension Mother   . Hyperlipidemia Mother   . Obesity Mother   . Ovarian cancer Maternal Grandmother   . HIV Father        died from complications (IVDU)  . Alcohol abuse Father   . Drug abuse Father     No Known Allergies  Current Outpatient Medications on File Prior to Visit  Medication Sig Dispense Refill  . amLODipine (NORVASC) 10 MG tablet Take 1 tablet (10 mg total) by mouth daily. 90 tablet 1  . Dapagliflozin-metFORMIN HCl ER 02-999 MG TB24 Take by mouth.    . Dulaglutide 1.5 MG/0.5ML SOPN Inject into the skin.    [name] escitalopram (LEXAPRO) 10 MG tablet Take 1 tablet (10 mg total) by mouth daily. 30 tablet 1  . lisinopril (ZESTRIL) 40 MG tablet Take 1 tablet (40 mg total) by mouth daily. 90 tablet 1  . lovastatin (MEVACOR) 40 MG tablet Take 1 tablet (40 mg total) by mouth at bedtime. 90 tablet 1  . traZODone (DESYREL) 50 MG tablet 1/2 to 1 tab by mouth at bedtime as needed for sleep 30 tablet 1  . Continuous Blood Gluc Sensor MISC 1 each by Does not apply route as directed. Use as directed every 14 days. May dispense FreeStyle [employer] or similar. 1 each 0   No current facility-administered medications on file prior to visit.    Ht 5\' 3"  (1.6 m)   Wt 277 lb (125.6 kg)   BMI 49.07 kg/m       Observations/Objective:   Gen: Awake, alert, no acute distress Resp: Breathing is even and non-labored Psych: calm/pleasant demeanor Neuro: Alert and Oriented x 3, + facial symmetry, speech is clear.   Assessment and Plan:  Depression/anxiety- improved on increased dose of lexapro (10mg ) once daily. Continue same. I also encouraged her to follow through with scheduling her visit with the counselor.  Insomnia- improved with HS trazodone. Continue same.   11 minutes spent on today's telephone visit.  Follow Up Instructions:    I discussed  the assessment and treatment plan with the patient. The patient was provided an opportunity to ask questions and all were answered. The patient agreed with the plan and demonstrated an understanding of the instructions.   The patient was advised to call back or seek an in-person evaluation if the symptoms worsen or if the condition fails to improve as anticipated.  [name], NP

## 2020-09-08 ENCOUNTER — Ambulatory Visit: Payer: 59 | Attending: Internal Medicine

## 2020-09-08 ENCOUNTER — Other Ambulatory Visit (HOSPITAL_BASED_OUTPATIENT_CLINIC_OR_DEPARTMENT_OTHER): Payer: Self-pay | Admitting: Internal Medicine

## 2020-09-08 ENCOUNTER — Telehealth: Payer: Self-pay | Admitting: Family

## 2020-09-08 DIAGNOSIS — Z23 Encounter for immunization: Secondary | ICD-10-CM

## 2020-09-08 NOTE — Telephone Encounter (Signed)
FMLA paperwork faxed into office  Place into [employer] bin

## 2020-09-08 NOTE — Progress Notes (Signed)
   Covid-19 Vaccination [facility]  Name:  Elizabeth [name]    MRN: [id number] DOB: Jul 19, 1976  09/08/2020  [name] was observed post Covid-19 immunization for 15 minutes without incident. She was provided with Vaccine Information Sheet and instruction to access the V-Safe system.   [name] was instructed to call 911 with any severe reactions post vaccine: [name] Difficulty breathing  . Swelling of face and throat  . A fast heartbeat  . A bad rash all over body  . Dizziness and weakness   Immunizations Administered    Name Date Dose VIS Date Route   Pfizer COVID-19 Vaccine 09/08/2020  1:12 PM 0.3 mL 08/09/2020 Intramuscular   Manufacturer: [place]   Lot: [id number]   [place]: [id number]

## 2020-09-08 NOTE — Telephone Encounter (Signed)
Form placed on provider's folder

## 2020-09-11 ENCOUNTER — Other Ambulatory Visit (HOSPITAL_BASED_OUTPATIENT_CLINIC_OR_DEPARTMENT_OTHER): Payer: Self-pay | Admitting: Internal Medicine

## 2020-09-11 MED FILL — AMLODIPINE BESYLATE 10 MG T: 10 | 90 days supply | Qty: 90 | Fill #2

## 2020-09-11 MED FILL — XIGDUO XR 5 MG-1,000 MG TAB: 5-1000 | 90 days supply | Qty: 180 | Fill #0

## 2020-09-12 ENCOUNTER — Encounter: Payer: Self-pay | Admitting: Family

## 2020-09-12 ENCOUNTER — Telehealth: Payer: Self-pay | Admitting: Family

## 2020-09-12 DIAGNOSIS — Z0279 Encounter for issue of other medical certificate: Secondary | ICD-10-CM

## 2020-09-12 NOTE — Telephone Encounter (Signed)
Patient is calling you back. Please call her at [phone]

## 2020-09-12 NOTE — Telephone Encounter (Signed)
FMLA paperwork was faxed into office  Papers put into [employer] bins up front

## 2020-09-12 NOTE — Telephone Encounter (Signed)
Called back but n/a

## 2020-09-12 NOTE — Telephone Encounter (Signed)
This is a duplicate form, provider working on same form faxed last week

## 2020-09-12 NOTE — Telephone Encounter (Signed)
Patient advised her forms have been completed and faxed to [facility]

## 2020-10-02 ENCOUNTER — Encounter: Payer: Self-pay | Admitting: Family

## 2020-10-04 MED FILL — TRULICITY 1.5 MG/0.5 ML PEN: 1.5 | 28 days supply | Qty: 2 | Fill #2

## 2020-10-24 ENCOUNTER — Encounter: Payer: Self-pay | Admitting: Family

## 2020-10-24 ENCOUNTER — Other Ambulatory Visit: Payer: Self-pay | Admitting: Family

## 2020-10-24 MED ORDER — ESCITALOPRAM OXALATE 20 MG PO TABS
20.0000 mg | ORAL_TABLET | Freq: Every day | ORAL | 1 refills | Status: DC
Start: 2020-10-24 — End: 2020-10-24

## 2020-10-24 MED FILL — ESCITALOPRAM 20 MG TABLET: 20 | 30 days supply | Qty: 30 | Fill #0

## 2020-10-26 MED FILL — TRULICITY 1.5 MG/0.5 ML PEN: 1.5 | 28 days supply | Qty: 2 | Fill #3

## 2020-11-14 ENCOUNTER — Other Ambulatory Visit: Payer: Self-pay | Admitting: Family

## 2020-11-14 ENCOUNTER — Encounter: Payer: Self-pay | Admitting: Family

## 2020-11-14 MED FILL — LOVASTATIN 40 MG TABS: 40 | 90 days supply | Qty: 90 | Fill #0

## 2020-11-20 ENCOUNTER — Other Ambulatory Visit: Payer: Self-pay

## 2020-11-20 ENCOUNTER — Encounter: Payer: Self-pay | Admitting: Family

## 2020-11-20 ENCOUNTER — Other Ambulatory Visit: Payer: Self-pay | Admitting: Family

## 2020-11-20 ENCOUNTER — Telehealth (INDEPENDENT_AMBULATORY_CARE_PROVIDER_SITE_OTHER): Payer: No Typology Code available for payment source | Admitting: Family

## 2020-11-20 DIAGNOSIS — G4700 Insomnia, unspecified: Secondary | ICD-10-CM | POA: Diagnosis not present

## 2020-11-20 MED ORDER — HYDROXYZINE HCL 25 MG PO TABS: 25.0000 mg | ORAL_TABLET | Freq: Every evening | ORAL | 3 refills | Status: DC | PRN

## 2020-11-20 MED FILL — hydrOXYzine HCL 25 MG TABS: 25 | 30 days supply | Qty: 30 | Fill #0

## 2020-11-20 MED FILL — LISINOPRIL 40 MG TABS: 40 | 90 days supply | Qty: 90 | Fill #0

## 2020-11-20 NOTE — Progress Notes (Signed)
Virtual Visit via Video Note  I connected with Elizabeth [name] on 11/20/20 at  8:40 AM EST by a video enabled telemedicine application and verified that I am speaking with the correct person using two identifiers.  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 The patient expressed understanding and agreed to proceed. Only the patient and myself were present for today's video call.   History of Present Illness:  Patient is a 45 year old female who presents today to discuss insomnia.  She reports that for the last month or so she has been having a lot of anxiety in the evenings.  This is accompanied by racing thoughts and difficulty falling asleep.  She has tried trazodone 50 mg which caused her to be really drowsy in the morning.  She then decreased to 25 mg.  This worked for a while however has stopped working.  She has also tried melatonin without improvement in her sleep.  She is scheduled to meet with her counselor this afternoon.  She also continues Lexapro 20 mg once daily.  She denies any new stressors.  Past Medical History:  Diagnosis Date  . Anemia   . Arthritis of left knee   . Astigmatism   . Depression   . Diabetes mellitus without complication ([place])   . Dry mouth   . Fatigue   . GERD (gastroesophageal reflux disease)   . Heartburn   . High cholesterol   . Hypertension   . Keratoconjunctivitis   . Muscle pain   . Vitamin D deficiency   . Weakness      Social History   Socioeconomic History  . Marital status: Married    Spouse name: [name]  . Number of children: 2  . Years of education: Not on file  . Highest education level: Not on file  Occupational History  . 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 Smoking status: Former Smoker    Years: 3.00  . Smokeless tobacco: Never Used  Vaping Use  . Vaping Use:  Never used  Substance and Sexual Activity  . Alcohol use: No  . Drug use: No  . Sexual activity: Yes    Partners: Male    Birth control/protection: Surgical, Implant  Other Topics Concern  . Not on file  Social History Narrative    2 children   45- son Allen Kell   2000- son Insurance claims handler in [place] in [place]   Enjoys reading   Widowed 08/03/2023, husband died from COVID-1.   Social Determinants of Health   Financial Resource Strain: Not on file  Food Insecurity: Not on file  Transportation Needs: Not on file  Physical Activity: Not on file  Stress: Not on file  Social Connections: Not on file  Intimate Partner Violence: Not on file    Past Surgical History:  Procedure Laterality Date  . DILATION AND CURETTAGE OF UTERUS    . ENDOMETRIAL BIOPSY  12/19/2017   normal per pt  . TONSILLECTOMY    . TUBAL LIGATION      Family History  Problem Relation Age of Onset  . Diabetes Mother   . Hypertension Mother   . Hyperlipidemia Mother   . Obesity Mother   . Ovarian cancer Maternal Grandmother   . HIV Father        died from complications (IVDU)  .  Alcohol abuse Father   . Drug abuse Father     No Known Allergies  Current Outpatient Medications on File Prior to Visit  Medication Sig Dispense Refill  . amLODipine (NORVASC) 10 MG tablet Take 1 tablet (10 mg total) by mouth daily. 90 tablet 1  . Dapagliflozin-metFORMIN HCl ER 02-999 MG TB24 Take by mouth.    . Dulaglutide 1.5 MG/0.5ML SOPN Inject into the skin.    [name] escitalopram (LEXAPRO) 20 MG tablet Take 1 tablet (20 mg total) by mouth daily. 30 tablet 1  . lisinopril (ZESTRIL) 40 MG tablet Take 1 tablet (40 mg total) by mouth daily. 90 tablet 1  . lovastatin (MEVACOR) 40 MG tablet TAKE 1 TABLET (40 MG TOTAL) BY MOUTH AT BEDTIME. 90 tablet 1   No current facility-administered medications on file prior to visit.    There were no vitals taken for this  visit.   Observations/Objective:     Gen: Awake, alert, no acute distress Resp: Breathing is even and non-labored Psych: calm/pleasant demeanor Neuro: Alert and Oriented x 3, + facial symmetry, speech is clear.   Assessment and Plan:  Insomnia-uncontrolled.  Will discontinue trazodone, and instead give her a trial of hydroxyzine 25 mg at bedtime as needed since she has some increased anxiety in the evenings, I suggested she take the hydroxyzine around 7 PM.  Hopefully this will help with both her anxiety and her insomnia.  She is scheduled for follow-up in a few weeks and we will plan to see her back at that time to reassess. Follow Up Instructions:    I discussed the assessment and treatment plan with the patient. The patient was provided an opportunity to ask questions and all were answered. The patient agreed with the plan and demonstrated an understanding of the instructions.   The patient was advised to call back or seek an in-person evaluation if the symptoms worsen or if the condition fails to improve as anticipated.  [name], NP

## 2020-12-04 ENCOUNTER — Other Ambulatory Visit (HOSPITAL_BASED_OUTPATIENT_CLINIC_OR_DEPARTMENT_OTHER): Payer: Self-pay | Admitting: Internal Medicine

## 2020-12-04 MED FILL — XIGDUO XR 5 MG-1,000 MG TAB: 5-1000 | 90 days supply | Qty: 180 | Fill #1

## 2020-12-04 MED FILL — AMLODIPINE BESYLATE 10 MG T: 10 | 90 days supply | Qty: 90 | Fill #3

## 2020-12-04 MED FILL — TRULICITY 1.5 MG/0.5 ML PEN: 1.5 | 28 days supply | Qty: 2 | Fill #0

## 2020-12-08 ENCOUNTER — Ambulatory Visit (INDEPENDENT_AMBULATORY_CARE_PROVIDER_SITE_OTHER): Payer: No Typology Code available for payment source | Admitting: Family

## 2020-12-08 ENCOUNTER — Other Ambulatory Visit: Payer: Self-pay

## 2020-12-08 ENCOUNTER — Telehealth: Payer: Self-pay | Admitting: Family

## 2020-12-08 ENCOUNTER — Encounter: Payer: Self-pay | Admitting: Family

## 2020-12-08 VITALS — BP 125/79 | HR 88 | Temp 98.5°F | Resp 16 | Ht 63.0 in | Wt 288.6 lb

## 2020-12-08 DIAGNOSIS — G4700 Insomnia, unspecified: Secondary | ICD-10-CM

## 2020-12-08 DIAGNOSIS — Z1159 Encounter for screening for other viral diseases: Secondary | ICD-10-CM

## 2020-12-08 DIAGNOSIS — Z0000 Encounter for general adult medical examination without abnormal findings: Secondary | ICD-10-CM | POA: Diagnosis not present

## 2020-12-08 DIAGNOSIS — I10 Essential (primary) hypertension: Secondary | ICD-10-CM

## 2020-12-08 DIAGNOSIS — E1165 Type 2 diabetes mellitus with hyperglycemia: Secondary | ICD-10-CM | POA: Diagnosis not present

## 2020-12-08 DIAGNOSIS — Z114 Encounter for screening for human immunodeficiency virus [HIV]: Secondary | ICD-10-CM

## 2020-12-08 LAB — BASIC METABOLIC PANEL
BUN: 8 mg/dL (ref 6–23)
CO2: 26 mEq/L (ref 19–32)
Calcium: 9.1 mg/dL (ref 8.4–10.5)
Chloride: 100 mEq/L (ref 96–112)
Creatinine, Ser: 0.61 mg/dL (ref 0.40–1.20)
GFR: 110.21 mL/min (ref >60.00)
Glucose, Bld: 111 mg/dL — ABNORMAL HIGH (ref 70–99)
Potassium: 4.5 mEq/L (ref 3.5–5.1)
Sodium: 135 mEq/L (ref 135–145)

## 2020-12-08 LAB — HEMOGLOBIN A1C: Hgb A1c MFr Bld: 6.9 % — ABNORMAL HIGH (ref 4.6–6.5)

## 2020-12-08 NOTE — Progress Notes (Signed)
Subjective:    Patient ID: Elizabeth [name], female    DOB: [birth year], 45 y.o.   MRN: [id number]  HPI   Patient is a 45 yr old female who presents today for follow up of her insomnia.  Last visit we discontinued trazodone and instead gave her a trial of hydroxyzine 25 mg in the evenings to help with both her insomnia as well as her evening anxiety. She was continued on lexapro 20 mg.   She reports that she is taking 1/2 tab and it is helping with her evening anxiety. No longer having racing thoughts and sleeps through the night.   Immunizations: up to date.  Diet: diet is fair Wt Readings from Last 3 Encounters:  12/08/20 288 lb 9.6 oz (130.9 kg)  09/04/20 277 lb (125.6 kg)  04/04/20 276 lb (125.2 kg)  Exercise: not exercising Pap Smear: She sees GYN, due Mammogram: up to date Vision: every June Dental:  Up to date- has gum disease   Review of Systems  Constitutional: Negative for unexpected weight change.  HENT: Negative for hearing loss and rhinorrhea.   Eyes: Negative for visual disturbance.  Respiratory: Negative for cough and shortness of breath.   Cardiovascular: Negative for chest pain.  Gastrointestinal: Negative for blood in stool, constipation and diarrhea.  Genitourinary: Negative for dysuria, frequency, hematuria and menstrual problem.  Musculoskeletal: Negative for arthralgias and myalgias.  Skin: Negative for rash.  Neurological: Negative for headaches.  Hematological: Negative for adenopathy.  Psychiatric/Behavioral:       See hpi     Past Medical History:  Diagnosis Date  . Anemia   . Arthritis of left knee   . Astigmatism   . Depression   . Diabetes mellitus without complication ([place])   . Dry mouth   . Fatigue   . GERD (gastroesophageal reflux disease)   . Heartburn   . High cholesterol   . Hypertension   . Keratoconjunctivitis   . Muscle pain   . Vitamin D deficiency   . Weakness      Social History   Socioeconomic History  . Marital  status: Married    Spouse name: [name]  . Number of children: 2  . Years of education: Not on file  . Highest education level: Not on file  Occupational History  . 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 Smoking status: Former Smoker    Years: 3.00  . Smokeless tobacco: Never Used  Vaping Use  . Vaping Use: Never used  Substance and Sexual Activity  . Alcohol use: No  . Drug use: No  . Sexual activity: Yes    Partners: Male    Birth control/protection: Surgical, Implant  Other Topics Concern  . Not on file  Social History Narrative    2 children   59- son Allen Kell   2000- son Insurance claims handler in [place] in [place]   Enjoys reading   Widowed 08-02-23, husband died from COVID-43.   Social Determinants of Health   Financial Resource Strain: Not on file  Food Insecurity: Not on file  Transportation Needs: Not on file  Physical Activity: Not on file  Stress: Not on file  Social Connections: Not on file  Intimate Partner Violence: Not on file    Past Surgical History:  Procedure Laterality Date  . DILATION AND CURETTAGE OF UTERUS    . ENDOMETRIAL BIOPSY  12/19/2017   normal per pt  .  TONSILLECTOMY    . TUBAL LIGATION      Family History  Problem Relation Age of Onset  . Diabetes Mother   . Hypertension Mother   . Hyperlipidemia Mother   . Obesity Mother   . Ovarian cancer Maternal Grandmother   . HIV Father        died from complications (IVDU)  . Alcohol abuse Father   . Drug abuse Father     No Known Allergies  Current Outpatient Medications on File Prior to Visit  Medication Sig Dispense Refill  . amLODipine (NORVASC) 10 MG tablet Take 1 tablet (10 mg total) by mouth daily. 90 tablet 1  . Dapagliflozin-metFORMIN HCl ER 02-999 MG TB24 Take by mouth.    . Dulaglutide 1.5 MG/0.5ML SOPN Inject into the skin.    [name] escitalopram (LEXAPRO) 20 MG tablet Take 1 tablet (20 mg total) by mouth daily. 30 tablet  1  . hydrOXYzine (ATARAX/VISTARIL) 25 MG tablet Take 1 tablet (25 mg total) by mouth at bedtime as needed for anxiety (and insomnia). 30 tablet 3  . lisinopril (ZESTRIL) 40 MG tablet Take 1 tablet (40 mg total) by mouth daily. 90 tablet 1  . lovastatin (MEVACOR) 40 MG tablet TAKE 1 TABLET (40 MG TOTAL) BY MOUTH AT BEDTIME. 90 tablet 1   No current facility-administered medications on file prior to visit.    BP 125/79 (BP Location: Right Arm, Patient Position: Sitting, Cuff Size: Large)   Pulse 88   Temp 98.5 F (36.9 C) (Oral)   Resp 16   Ht 5\' 3"  (1.6 m)   Wt 288 lb 9.6 oz (130.9 kg)   SpO2 100%   BMI 51.12 kg/m       Objective:   Physical Exam Physical Exam  Constitutional: She is oriented to person, place, and time. She appears well-developed and well-nourished. No distress.  HENT:  Head: Normocephalic and atraumatic.  Right Ear: Tympanic membrane and ear canal normal.  Left Ear: Tympanic membrane and ear canal normal.  Mouth/Throat: Not examined, pt wearing mask Eyes: Pupils are equal, round, and reactive to light. No scleral icterus.  Neck: Normal range of motion. No thyromegaly present.  Cardiovascular: Normal rate and regular rhythm.   No murmur heard. Pulmonary/Chest: Effort normal and breath sounds normal. No respiratory distress. He has no wheezes. She has no rales. She exhibits no tenderness.  Abdominal: Soft. Bowel sounds are normal. She exhibits no distension and no mass. There is no tenderness. There is no rebound and no guarding.  Musculoskeletal: She exhibits no edema.  Lymphadenopathy:    She has no cervical adenopathy.  Neurological: She is alert and oriented to person, place, and time. She has normal patellar reflexes. She exhibits normal muscle tone. Coordination normal.  Skin: Skin is warm and dry.  Psychiatric: She has a normal mood and affect. Her behavior is normal. Judgment and thought content normal.  Breast/pelvic: deferred           Assessment & Plan:    Preventative care- discussed healthy diet, exercise and weight loss. Unfortunately, she started smoking again and I advised her to work on quitting.  She will be due for pneumovax booster later this summer. Otherwise, immunizations are reviewed and up to date. She will schedule pap with GYN. Mammogram is up to date.  Insomnia- much better with the addition of atarax.  Continue 25mg  tabs. Pt is only taking 1/2 tab.    This visit occurred during the SARS-CoV-2 public health emergency.  Safety protocols were in place, including screening questions prior to the visit, additional usage of staff PPE, and extensive cleaning of exam room while observing appropriate contact time as indicated for disinfecting solutions.         Assessment & Plan:

## 2020-12-08 NOTE — Telephone Encounter (Signed)
Please call Dr. Devonne Doughty to request copy of vision exam.

## 2020-12-08 NOTE — Telephone Encounter (Signed)
Dr Creola Corn office closed for the day. Will fax a request on monday

## 2020-12-08 NOTE — Patient Instructions (Signed)
Please work on quitting smoking and adding regular exercise, portion control, good food choices.

## 2020-12-11 ENCOUNTER — Encounter: Payer: Self-pay | Admitting: Family

## 2020-12-11 DIAGNOSIS — E1165 Type 2 diabetes mellitus with hyperglycemia: Secondary | ICD-10-CM

## 2020-12-11 LAB — HEPATITIS C ANTIBODY
Hepatitis C Ab: NONREACTIVE
SIGNAL TO CUT-OFF: 0.01 (ref <1.00)

## 2020-12-11 LAB — HIV ANTIBODY (ROUTINE TESTING W REFLEX): HIV 1&2 Ab, 4th Generation: NONREACTIVE

## 2020-12-12 ENCOUNTER — Encounter: Payer: Self-pay | Admitting: Family

## 2020-12-13 ENCOUNTER — Other Ambulatory Visit: Payer: Self-pay

## 2020-12-13 ENCOUNTER — Ambulatory Visit (INDEPENDENT_AMBULATORY_CARE_PROVIDER_SITE_OTHER): Payer: No Typology Code available for payment source | Admitting: Medical

## 2020-12-13 ENCOUNTER — Other Ambulatory Visit: Payer: Self-pay | Admitting: Medical

## 2020-12-13 VITALS — BP 110/71 | HR 100 | Temp 98.6°F | Ht 63.0 in | Wt 290.4 lb

## 2020-12-13 DIAGNOSIS — R1013 Epigastric pain: Secondary | ICD-10-CM | POA: Diagnosis not present

## 2020-12-13 DIAGNOSIS — K219 Gastro-esophageal reflux disease without esophagitis: Secondary | ICD-10-CM | POA: Diagnosis not present

## 2020-12-13 LAB — COMPREHENSIVE METABOLIC PANEL
ALT: 16 U/L (ref 0–35)
AST: 14 U/L (ref 0–37)
Albumin: 3.8 g/dL (ref 3.5–5.2)
Alkaline Phosphatase: 94 U/L (ref 39–117)
BUN: 10 mg/dL (ref 6–23)
CO2: 24 mEq/L (ref 19–32)
Calcium: 9.2 mg/dL (ref 8.4–10.5)
Chloride: 105 mEq/L (ref 96–112)
Creatinine, Ser: 0.61 mg/dL (ref 0.40–1.20)
GFR: 110.2 mL/min (ref >60.00)
Glucose, Bld: 134 mg/dL — ABNORMAL HIGH (ref 70–99)
Potassium: 4.5 mEq/L (ref 3.5–5.1)
Sodium: 138 mEq/L (ref 135–145)
Total Bilirubin: 0.3 mg/dL (ref 0.2–1.2)
Total Protein: 6.6 g/dL (ref 6.0–8.3)

## 2020-12-13 LAB — CBC WITH DIFFERENTIAL/PLATELET
Basophils Absolute: 0.1 10*3/uL (ref 0.0–0.1)
Basophils Relative: 0.9 % (ref 0.0–3.0)
Eosinophils Absolute: 0.1 10*3/uL (ref 0.0–0.7)
Eosinophils Relative: 1.7 % (ref 0.0–5.0)
HCT: 41.1 % (ref 36.0–46.0)
Hemoglobin: 13 g/dL (ref 12.0–15.0)
Lymphocytes Relative: 28.4 % (ref 12.0–46.0)
Lymphs Abs: 2.3 10*3/uL (ref 0.7–4.0)
MCHC: 31.7 g/dL (ref 30.0–36.0)
MCV: 74.9 fl — ABNORMAL LOW (ref 78.0–100.0)
Monocytes Absolute: 0.5 10*3/uL (ref 0.1–1.0)
Monocytes Relative: 5.8 % (ref 3.0–12.0)
Neutro Abs: 5.2 10*3/uL (ref 1.4–7.7)
Neutrophils Relative %: 63.2 % (ref 43.0–77.0)
Platelets: 287 10*3/uL (ref 150.0–400.0)
RBC: 5.49 Mil/uL — ABNORMAL HIGH (ref 3.87–5.11)
RDW: 15.4 % (ref 11.5–15.5)
WBC: 8.2 10*3/uL (ref 4.0–10.5)

## 2020-12-13 LAB — LIPASE: Lipase: 69 U/L — ABNORMAL HIGH (ref 11.0–59.0)

## 2020-12-13 MED ORDER — OMEPRAZOLE 40 MG PO CPDR
40.0000 mg | DELAYED_RELEASE_CAPSULE | Freq: Every day | ORAL | 3 refills | Status: DC
Start: 2020-12-13 — End: 2021-01-18

## 2020-12-13 MED ORDER — SUCRALFATE 1 G PO TABS
ORAL_TABLET | ORAL | 0 refills | Status: DC
Start: 2020-12-13 — End: 2021-01-09

## 2020-12-13 MED ORDER — FAMOTIDINE 20 MG PO TABS
20.0000 mg | ORAL_TABLET | Freq: Every day | ORAL | 3 refills | Status: DC
Start: 2020-12-13 — End: 2021-01-18

## 2020-12-13 MED FILL — OMEPRAZOLE 40 MG CPDR: 40 | 30 days supply | Qty: 30 | Fill #0

## 2020-12-13 MED FILL — SUCRALFATE 1 GM TABLET: 1 | 30 days supply | Qty: 90 | Fill #0

## 2020-12-13 MED FILL — FAMOTIDINE 20 MG TABLET: 20 | 30 days supply | Qty: 30 | Fill #0

## 2020-12-13 NOTE — Progress Notes (Signed)
Subjective:    Patient ID: Elizabeth [name], female    DOB: November 07, 1975, 45 y.o.   MRN: [id number]  HPI  Pt in with epigastric pain. Feels nausea, sour taste in mouth and states feels like acid.   Pt states symptoms since Friday evening. Symptoms are definitive related to eating. Pt states feels like needs to vomit even with just drinking water. Pt has been belching alot  Pt states peptobismal did help and alka seltzer helped. Pt states past weekend her heart burns but she has had gerd for years. Overall has had for 7-8 years. Takes otc omeprazole 4-5 days a week.   Pt states her diet has been on and off healthy. Sometimes does not eat since some depression. Husband deceased 2019-07-19.  LMP- nexplanon.   Review of Systems  Constitutional: Negative for chills, fatigue and fever.  Respiratory: Negative for cough, chest tightness, shortness of breath and wheezing.   Cardiovascular: Negative for chest pain and palpitations.  Gastrointestinal: Positive for abdominal pain, nausea and vomiting. Negative for abdominal distention, blood in stool, constipation and diarrhea.       Vomited one time. She states can taste acid. Sometimes feels acid and food coming up.  Neurological: Negative for dizziness and light-headedness.    Past Medical History:  Diagnosis Date  . Anemia   . Arthritis of left knee   . Astigmatism   . Depression   . Diabetes mellitus without complication ([place])   . Dry mouth   . Fatigue   . GERD (gastroesophageal reflux disease)   . Heartburn   . High cholesterol   . Hypertension   . Keratoconjunctivitis   . Muscle pain   . Vitamin D deficiency   . Weakness      Social History   Socioeconomic History  . Marital status: Married    Spouse name: [name]  . Number of children: 2  . Years of education: Not on file  . Highest education level: Not on file  Occupational History  . Occupation: CMA - Economist: [employer]  Tobacco Use  .  Smoking status: Current Every Day Smoker    Years: 3.00  . Smokeless tobacco: Never Used  . Tobacco comment: 4-5/day  Vaping Use  . Vaping Use: Never used  Substance and Sexual Activity  . Alcohol use: No  . Drug use: No  . Sexual activity: Yes    Partners: Male    Birth control/protection: Surgical, Implant  Other Topics Concern  . Not on file  Social History Narrative    2 children   36- son Allen Kell   2000- son Insurance claims handler in [employer] in [place]   Enjoys reading   Widowed Jul 18, 2023, husband died from COVID-68.   Social Determinants of Health   Financial Resource Strain: Not on file  Food Insecurity: Not on file  Transportation Needs: Not on file  Physical Activity: Not on file  Stress: Not on file  Social Connections: Not on file  Intimate Partner Violence: Not on file    Past Surgical History:  Procedure Laterality Date  . DILATION AND CURETTAGE OF UTERUS    . ENDOMETRIAL BIOPSY  12/19/2017   normal per pt  . TONSILLECTOMY    . TUBAL LIGATION      Family History  Problem Relation Age of Onset  . Diabetes Mother   . Hypertension Mother   . Hyperlipidemia Mother   . Obesity Mother   .  Ovarian cancer Maternal Grandmother   . HIV Father        died from complications (IVDU)  . Alcohol abuse Father   . Drug abuse Father     No Known Allergies  Current Outpatient Medications on File Prior to Visit  Medication Sig Dispense Refill  . amLODipine (NORVASC) 10 MG tablet Take 1 tablet (10 mg total) by mouth daily. 90 tablet 1  . Dapagliflozin-metFORMIN HCl ER 02-999 MG TB24 Take by mouth.    . Dulaglutide 1.5 MG/0.5ML SOPN Inject into the skin.    [name] escitalopram (LEXAPRO) 20 MG tablet Take 1 tablet (20 mg total) by mouth daily. 30 tablet 1  . hydrOXYzine (ATARAX/VISTARIL) 25 MG tablet Take 1 tablet (25 mg total) by mouth at bedtime as needed for anxiety (and insomnia). 30 tablet 3  . lisinopril (ZESTRIL) 40 MG tablet Take 1 tablet (40 mg  total) by mouth daily. 90 tablet 1  . lovastatin (MEVACOR) 40 MG tablet TAKE 1 TABLET (40 MG TOTAL) BY MOUTH AT BEDTIME. 90 tablet 1   No current facility-administered medications on file prior to visit.    BP 110/71   Pulse 100   Temp 98.6 F (37 C) (Oral)   Ht 5\' 3"  (1.6 m)   Wt 290 lb 6.4 oz (131.7 kg)   SpO2 96%   BMI 51.44 kg/m       Objective:   Physical Exam   General Mental Status- Alert. General Appearance- Not in acute distress.   Skin General: Color- Normal Color. Moisture- Normal Moisture.  Neck Carotid Arteries- Normal color. Moisture- Normal Moisture. No carotid bruits. No JVD.  Chest and Lung Exam Auscultation: Breath Sounds:-Normal.  Cardiovascular Auscultation:Rythm- Regular. Murmurs & Other Heart Sounds:Auscultation of the heart reveals- No Murmurs.  Abdomen Inspection:-Inspeection Normal. Palpation/Percussion:Note:No mass. Palpation and Percussion of the abdomen reveal- faint epigastric Tender, Non Distended + BS, no rebound or guarding.    Neurologic Cranial Nerve exam:- CN III-XII intact(No nystagmus), symmetric smile. Strength:- 5/5 equal and symmetric strength both upper and lower extremities.      Assessment & Plan:  You have severe reflux symptoms recently. We gave you Gi cocktail in office today.(pain resolved with cocktail)  Will increase your omeprazole to 40 mg daily and can add on famotadine 20 mg daily. If with both symptoms persist then add on carafate. Eat bland healthy diet.  Will get cbc, cmp, lipase and h pylori antibodies.  If symptoms persist despite dx then refer to GI for probable egd.  Follow up 7-10 days or as needed.  Mackie Pai, PA-C

## 2020-12-13 NOTE — Patient Instructions (Addendum)
You have severe reflux symptoms recently. We gave you Gi cocktail in office today.(pain resolved with cocktail)   Will increase your omeprazole to 40 mg daily and can add on famotadine 20 mg daily. If with both symptoms persist then add on carafate. Eat bland healthy diet.  Will get cbc, cmp, lipase and h pylori antibodies.  If symptoms persist despite dx then refer to GI for probable egd.  Follow up 7-10 days or as needed.

## 2020-12-14 ENCOUNTER — Encounter: Payer: Self-pay | Admitting: Family

## 2020-12-15 LAB — H PYLORI, IGM, IGG, IGA AB
H pylori, IgM Abs: <9 units (ref 0.0–8.9)
H. pylori, IgA Abs: <9 units (ref 0.0–8.9)
H. pylori, IgG AbS: 0.36 Index Value (ref 0.00–0.79)

## 2020-12-19 ENCOUNTER — Ambulatory Visit: Payer: No Typology Code available for payment source | Admitting: Family

## 2020-12-20 ENCOUNTER — Encounter (HOSPITAL_BASED_OUTPATIENT_CLINIC_OR_DEPARTMENT_OTHER): Payer: Self-pay | Admitting: Emergency Medicine

## 2020-12-20 ENCOUNTER — Emergency Department (HOSPITAL_BASED_OUTPATIENT_CLINIC_OR_DEPARTMENT_OTHER)
Admission: EM | Admit: 2020-12-20 | Discharge: 2020-12-20 | Disposition: A | Discharge status: Home / Self Care | Payer: No Typology Code available for payment source | Attending: Emergency Medicine | Admitting: Emergency Medicine

## 2020-12-20 ENCOUNTER — Telehealth: Payer: Self-pay | Admitting: Family

## 2020-12-20 ENCOUNTER — Ambulatory Visit: Payer: No Typology Code available for payment source | Admitting: Family

## 2020-12-20 ENCOUNTER — Other Ambulatory Visit: Payer: Self-pay

## 2020-12-20 DIAGNOSIS — F17200 Nicotine dependence, unspecified, uncomplicated: Secondary | ICD-10-CM | POA: Insufficient documentation

## 2020-12-20 DIAGNOSIS — Z79899 Other long term (current) drug therapy: Secondary | ICD-10-CM | POA: Diagnosis not present

## 2020-12-20 DIAGNOSIS — I10 Essential (primary) hypertension: Secondary | ICD-10-CM | POA: Diagnosis not present

## 2020-12-20 DIAGNOSIS — R1013 Epigastric pain: Secondary | ICD-10-CM | POA: Diagnosis present

## 2020-12-20 DIAGNOSIS — E1165 Type 2 diabetes mellitus with hyperglycemia: Secondary | ICD-10-CM | POA: Insufficient documentation

## 2020-12-20 DIAGNOSIS — K219 Gastro-esophageal reflux disease without esophagitis: Secondary | ICD-10-CM | POA: Insufficient documentation

## 2020-12-20 LAB — CBC WITH DIFFERENTIAL/PLATELET
Abs Immature Granulocytes: 0.05 10*3/uL (ref 0.00–0.07)
Basophils Absolute: 0.1 10*3/uL (ref 0.0–0.1)
Basophils Relative: 1 %
Eosinophils Absolute: 0.1 10*3/uL (ref 0.0–0.5)
Eosinophils Relative: 1 %
HCT: 40 % (ref 36.0–46.0)
Hemoglobin: 12.7 g/dL (ref 12.0–15.0)
Immature Granulocytes: 1 %
Lymphocytes Relative: 33 %
Lymphs Abs: 3.4 10*3/uL (ref 0.7–4.0)
MCH: 23.5 pg — ABNORMAL LOW (ref 26.0–34.0)
MCHC: 31.8 g/dL (ref 30.0–36.0)
MCV: 74.1 fL — ABNORMAL LOW (ref 80.0–100.0)
Monocytes Absolute: 0.6 10*3/uL (ref 0.1–1.0)
Monocytes Relative: 6 %
Neutro Abs: 6.1 10*3/uL (ref 1.7–7.7)
Neutrophils Relative %: 58 %
Platelets: 308 10*3/uL (ref 150–400)
RBC: 5.4 MIL/uL — ABNORMAL HIGH (ref 3.87–5.11)
RDW: 15.5 % (ref 11.5–15.5)
WBC: 10.4 10*3/uL (ref 4.0–10.5)
nRBC: 0 % (ref 0.0–0.2)

## 2020-12-20 LAB — COMPREHENSIVE METABOLIC PANEL
ALT: 17 U/L (ref 0–44)
AST: 16 U/L (ref 15–41)
Albumin: 3.3 g/dL — ABNORMAL LOW (ref 3.5–5.0)
Alkaline Phosphatase: 85 U/L (ref 38–126)
Anion gap: 9 (ref 5–15)
BUN: 10 mg/dL (ref 6–20)
CO2: 22 mmol/L (ref 22–32)
Calcium: 8.4 mg/dL — ABNORMAL LOW (ref 8.9–10.3)
Chloride: 106 mmol/L (ref 98–111)
Creatinine, Ser: 0.65 mg/dL (ref 0.44–1.00)
GFR, Estimated: >60 mL/min (ref >60)
Glucose, Bld: 141 mg/dL — ABNORMAL HIGH (ref 70–99)
Potassium: 3.7 mmol/L (ref 3.5–5.1)
Sodium: 137 mmol/L (ref 135–145)
Total Bilirubin: <0.1 mg/dL — ABNORMAL LOW (ref 0.3–1.2)
Total Protein: 6.7 g/dL (ref 6.5–8.1)

## 2020-12-20 LAB — LIPASE, BLOOD: Lipase: 48 U/L (ref 11–51)

## 2020-12-20 MED ORDER — ONDANSETRON HCL 4 MG/2ML IJ SOLN
4.0000 mg | Freq: Once | INTRAMUSCULAR | Status: AC
  Administered 2020-12-20: 4 mg via INTRAVENOUS
  Filled 2020-12-20: qty 2

## 2020-12-20 MED ORDER — SUCRALFATE 1 G PO TABS
1.0000 g | ORAL_TABLET | Freq: Three times a day (TID) | ORAL | 0 refills | Status: DC
Start: 2020-12-20 — End: 2021-01-09

## 2020-12-20 MED ORDER — MORPHINE SULFATE (PF) 4 MG/ML IV SOLN
4.0000 mg | Freq: Once | INTRAVENOUS | Status: AC
  Administered 2020-12-20: 4 mg via INTRAVENOUS
  Filled 2020-12-20: qty 1

## 2020-12-20 MED ORDER — ONDANSETRON 4 MG PO TBDP: 4.0000 mg | ORAL_TABLET | Freq: Three times a day (TID) | ORAL | 0 refills | Status: DC | PRN

## 2020-12-20 MED ORDER — LIDOCAINE VISCOUS HCL 2 % MT SOLN
15.0000 mL | Freq: Once | OROMUCOSAL | Status: AC
  Administered 2020-12-20: 15 mL via ORAL
  Filled 2020-12-20: qty 15

## 2020-12-20 MED ORDER — OXYCODONE-ACETAMINOPHEN 5-325 MG PO TABS: 1.0000 | ORAL_TABLET | Freq: Four times a day (QID) | ORAL | 0 refills | Status: DC | PRN

## 2020-12-20 MED ORDER — SUCRALFATE 1 G PO TABS
1.0000 g | ORAL_TABLET | Freq: Three times a day (TID) | ORAL | 0 refills | Status: DC
Start: 2020-12-20 — End: 2020-12-20

## 2020-12-20 MED ORDER — ALUM & MAG HYDROXIDE-SIMETH 200-200-20 MG/5ML PO SUSP
30.0000 mL | Freq: Once | ORAL | Status: AC
  Administered 2020-12-20: 30 mL via ORAL
  Filled 2020-12-20: qty 30

## 2020-12-20 NOTE — Telephone Encounter (Signed)
Patient was seen in ED last night. She was given DX of Stomach Ulcer. ED told her she should be referred to a GI specialist. Patient is requesting  A referral be sent in for her to see Elizabeth [name].   Thanks

## 2020-12-20 NOTE — ED Triage Notes (Signed)
Pt c/o epigastric abd pain that got worse tonight. Pt was seen approx 6 days ago and dx with pancreatitis. Pt has a follow up appointment tomorrow but was told to come to the ER if the pain got worse. Pt states she has been taking the Rx meds with no relief for the pain. Pt aaox3, ambulatory with steady gait, VSS, GCS 15.

## 2020-12-20 NOTE — ED Provider Notes (Signed)
[facility] EMERGENCY DEPARTMENT Provider Note   CSN: [id number] Arrival date & time: 12/20/20  0210     History Chief Complaint  Patient presents with  . Pancreatitis    Elizabeth [name] is a 45 y.o. female.  HPI     This a 45 year old female with a history of diabetes, reflux, hypertension who presents with ongoing epigastric pain.  Patient states that she has had over 1 week history of worsening epigastric pain.  She was seen last week and started on omeprazole.  She reports taking omeprazole, famotidine. she states that that has helped with her gas pain but she continues to have sharp pain that radiates to her back.  She was told "you might have pancreatitis."  She is not a drinker.  She does occasionally use NSAIDs.  She currently rates her pain at 8 out of 10.  She denies nausea or vomiting.  She states the pain acutely worsened tonight.  Unclear whether the pain is worse with eating.  Past Medical History:  Diagnosis Date  . Anemia   . Arthritis of left knee   . Astigmatism   . Depression   . Diabetes mellitus without complication ([place])   . Dry mouth   . Fatigue   . GERD (gastroesophageal reflux disease)   . Heartburn   . High cholesterol   . Hypertension   . Keratoconjunctivitis   . Muscle pain   . Vitamin D deficiency   . Weakness     Patient Active Problem List   Diagnosis Date Noted  . Hyperlipidemia 01/21/2018  . Uncontrolled type 2 diabetes mellitus with hyperglycemia ([place]) 01/21/2018  . Keratoconjunctivitis sicca of both eyes not specified as Sjogren's 04/04/2017  . Regular astigmatism of both eyes 04/04/2017  . Vitamin D deficiency 12/02/2016  . Essential hypertension 02/07/2016  . Morbid obesity with BMI of 50.0-59.9, adult ([place]) 02/07/2016  . Pure hypercholesterolemia 02/07/2016  . Type 2 diabetes mellitus with hyperglycemia, without long-term current use of insulin ([place]) 02/07/2016    Past Surgical History:  Procedure Laterality Date   . DILATION AND CURETTAGE OF UTERUS    . ENDOMETRIAL BIOPSY  12/19/2017   normal per pt  . TONSILLECTOMY    . TUBAL LIGATION       OB History    Gravida  2   Para  2   Term  2   Preterm  0   AB  0   Living  2     SAB  0   IAB  0   Ectopic  0   Multiple  0   Live Births              Family History  Problem Relation Age of Onset  . Diabetes Mother   . Hypertension Mother   . Hyperlipidemia Mother   . Obesity Mother   . Ovarian cancer Maternal Grandmother   . HIV Father        died from complications (IVDU)  . Alcohol abuse Father   . Drug abuse Father     Social History   Tobacco Use  . Smoking status: Current Every Day Smoker    Years: 3.00  . Smokeless tobacco: Never Used  . Tobacco comment: 4-5/day  Vaping Use  . Vaping Use: Never used  Substance Use Topics  . Alcohol use: No  . Drug use: No    Home Medications Prior to Admission medications   Medication Sig Start Date End Date Taking? Authorizing Provider  ondansetron (ZOFRAN ODT) 4 MG disintegrating tablet Take 1 tablet (4 mg total) by mouth every 8 (eight) hours as needed. 12/20/20  Yes Alaisa Moffitt, Barbette Hair, MD  oxyCODONE-acetaminophen (PERCOCET/ROXICET) 5-325 MG tablet Take 1 tablet by mouth every 6 (six) hours as needed for severe pain. 12/20/20  Yes Calais Svehla, Barbette Hair, MD  sucralfate (CARAFATE) 1 g tablet Take 1 tablet (1 g total) by mouth 4 (four) times daily -  with meals and at bedtime. 12/20/20  Yes Lucilia Yanni, Barbette Hair, MD  amLODipine (NORVASC) 10 MG tablet Take 1 tablet (10 mg total) by mouth daily. 07/10/20   [name], NP  Dapagliflozin-metFORMIN HCl ER 02-999 MG TB24 Take by mouth. 12/09/17   [provider]  Dulaglutide 1.5 MG/0.5ML SOPN Inject into the skin.    [provider]  escitalopram (LEXAPRO) 20 MG tablet Take 1 tablet (20 mg total) by mouth daily. 10/24/20   [name], NP  famotidine (PEPCID) 20 MG tablet Take 1 tablet (20 mg total) by mouth  daily. 12/13/20   Saguier, Percell Miller, PA-C  hydrOXYzine (ATARAX/VISTARIL) 25 MG tablet Take 1 tablet (25 mg total) by mouth at bedtime as needed for anxiety (and insomnia). 11/20/20   [name], NP  lisinopril (ZESTRIL) 40 MG tablet Take 1 tablet (40 mg total) by mouth daily. 07/10/20   [name], NP  lovastatin (MEVACOR) 40 MG tablet TAKE 1 TABLET (40 MG TOTAL) BY MOUTH AT BEDTIME. 11/14/20   [name], NP  omeprazole (PRILOSEC) 40 MG capsule Take 1 capsule (40 mg total) by mouth daily. 12/13/20   Saguier, Percell Miller, PA-C  sucralfate (CARAFATE) 1 g tablet 1 tab po tid 12/13/20   Saguier, Percell Miller, PA-C    Allergies    Patient has no known allergies.  Review of Systems   Review of Systems  Constitutional: Negative for fever.  Respiratory: Negative for shortness of breath.   Cardiovascular: Negative for chest pain.  Gastrointestinal: Positive for abdominal pain. Negative for diarrhea, nausea and vomiting.  Genitourinary: Negative for dysuria.  All other systems reviewed and are negative.   Physical Exam Updated Vital Signs BP (!) 151/93 (BP Location: Right Arm)   Pulse 76   Temp 98.6 F (37 C) (Oral)   Resp 16   Ht 1.575 m (5\' 2" )   Wt 129.7 kg   SpO2 100%   BMI 52.31 kg/m   Physical Exam Vitals and nursing note reviewed.  Constitutional:      Appearance: She is well-developed and well-nourished. She is obese. She is not ill-appearing.  HENT:     Head: Normocephalic and atraumatic.     Mouth/Throat:     Mouth: Mucous membranes are moist.  Eyes:     Pupils: Pupils are equal, round, and reactive to light.  Cardiovascular:     Rate and Rhythm: Normal rate and regular rhythm.     Heart sounds: Normal heart sounds.  Pulmonary:     Effort: Pulmonary effort is normal. No respiratory distress.     Breath sounds: No wheezing.  Abdominal:     General: Bowel sounds are normal.     Palpations: Abdomen is soft.     Tenderness: There is abdominal tenderness.      Comments: Epigastric tenderness to palpation, no rebound or guarding  Musculoskeletal:     Cervical back: Neck supple.  Skin:    General: Skin is warm and dry.  Neurological:     Mental Status: She is alert and oriented to person, place, and time.  Psychiatric:        Mood and Affect: Mood and affect and mood normal.     ED Results / Procedures / Treatments   Labs (all labs ordered are listed, but only abnormal results are displayed) Labs Reviewed  CBC WITH DIFFERENTIAL/PLATELET - Abnormal; Notable for the following components:      Result Value   RBC 5.40 (*)    MCV 74.1 (*)    MCH 23.5 (*)    All other components within normal limits  COMPREHENSIVE METABOLIC PANEL - Abnormal; Notable for the following components:   Glucose, Bld 141 (*)    Calcium 8.4 (*)    Albumin 3.3 (*)    Total Bilirubin <0.1 (*)    All other components within normal limits  LIPASE, BLOOD    EKG None  Radiology No results found.  Procedures Procedures   Medications Ordered in ED Medications  alum & mag hydroxide-simeth (MAALOX/MYLANTA) 200-200-20 MG/5ML suspension 30 mL (30 mLs Oral Given 12/20/20 0310)    And  lidocaine (XYLOCAINE) 2 % viscous mouth solution 15 mL (15 mLs Oral Given 12/20/20 0310)  morphine 4 MG/ML injection 4 mg (4 mg Intravenous Given 12/20/20 0314)  ondansetron (ZOFRAN) injection 4 mg (4 mg Intravenous Given 12/20/20 1308)    ED Course  I have reviewed the triage vital signs and the nursing notes.  Pertinent labs & imaging results that were available during my care of the patient were reviewed by me and considered in my medical decision making (see chart for details).    MDM Rules/Calculators/A&P                          Patient presents with ongoing epigastric pain.  She is overall nontoxic and vital signs are reassuring.  She does have some epigastric tenderness palpation.  I reviewed her chart.  She only had a mild elevation in her lipase.  Do not feel this is consistent  with her presentation and she is low risk otherwise.  Vital signs are reassuring.  Other considerations include peptic ulcer disease, reflux, cholecystitis.  Labs obtained.  No leukocytosis.  No significant metabolic derangements.  Lipase is normal, no LFT derangement.  Patient was given a GI cocktail and pain and nausea medication.  On recheck, she states she feels much better.  She is able to tolerate fluids.  Feel her presentation is fairly consistent with peptic ulcer disease.  She may benefit from EGD.  We will add Carafate and sent home with a short course of pain medication and nausea medication.  Patient has follow-up later today with primary and will be given gastroenterology follow-up at discharge.  After history, exam, and medical workup I feel the patient has been appropriately medically screened and is safe for discharge home. Pertinent diagnoses were discussed with the patient. Patient was given return precautions.  Final Clinical Impression(s) / ED Diagnoses Final diagnoses:  Epigastric pain    Rx / DC Orders ED Discharge Orders         Ordered    sucralfate (CARAFATE) 1 g tablet  3 times daily with meals & bedtime        12/20/20 0409    oxyCODONE-acetaminophen (PERCOCET/ROXICET) 5-325 MG tablet  Every 6 hours PRN        12/20/20 0409    ondansetron (ZOFRAN ODT) 4 MG disintegrating tablet  Every 8 hours PRN        12/20/20 0409  Merryl Hacker, MD 12/20/20 [phone]

## 2020-12-20 NOTE — Discharge Instructions (Addendum)
You were seen today for ongoing epigastric pain.  You need to follow-up with gastroenterology for EGD as this may represent a peptic ulcer.  Avoid anti-inflammatory medication such as ibuprofen or naproxen.  Avoid alcohol.  Take medications as prescribed.  Continue omeprazole, famotidine, Carafate.

## 2020-12-21 NOTE — Telephone Encounter (Signed)
Referral was placed yesterday.

## 2020-12-25 ENCOUNTER — Ambulatory Visit (INDEPENDENT_AMBULATORY_CARE_PROVIDER_SITE_OTHER): Payer: No Typology Code available for payment source | Admitting: Obstetrics and Gynecology

## 2020-12-25 ENCOUNTER — Encounter: Payer: Self-pay | Admitting: Obstetrics and Gynecology

## 2020-12-25 ENCOUNTER — Other Ambulatory Visit (HOSPITAL_COMMUNITY)
Admission: RE | Admit: 2020-12-25 | Discharge: 2020-12-25 | Disposition: A | Discharge status: Home / Self Care | Payer: No Typology Code available for payment source | Source: Ambulatory Visit | Attending: Obstetrics and Gynecology | Admitting: Obstetrics and Gynecology

## 2020-12-25 ENCOUNTER — Other Ambulatory Visit: Payer: Self-pay

## 2020-12-25 VITALS — BP 140/81 | HR 89 | Ht 63.0 in | Wt 288.0 lb

## 2020-12-25 DIAGNOSIS — Z124 Encounter for screening for malignant neoplasm of cervix: Secondary | ICD-10-CM

## 2020-12-25 DIAGNOSIS — Z01419 Encounter for gynecological examination (general) (routine) without abnormal findings: Secondary | ICD-10-CM | POA: Insufficient documentation

## 2020-12-25 DIAGNOSIS — Z975 Presence of (intrauterine) contraceptive device: Secondary | ICD-10-CM

## 2020-12-25 DIAGNOSIS — Z113 Encounter for screening for infections with a predominantly sexual mode of transmission: Secondary | ICD-10-CM

## 2020-12-25 MED FILL — hydrOXYzine HCL 25 MG TABS: 25 | 30 days supply | Qty: 30 | Fill #1

## 2020-12-25 NOTE — Progress Notes (Signed)
Last pap- 11/19/17- negative Last mam- 04/25/20- normal

## 2020-12-25 NOTE — Progress Notes (Signed)
GYNECOLOGY ANNUAL PREVENTATIVE CARE ENCOUNTER NOTE  Subjective:   Elizabeth [name] is a 45 y.o. G55P2002 female here for a annual gynecologic exam. Current complaints: can't feel Nexplanon, otherwise doing well.  Has had nexplanon in for 1 year and has occasional bleeding, she uses micronor when bleeding is prolonged. Last bleeding was in December and like a normal period for her.     Denies abnormal vaginal bleeding, discharge, pelvic pain, problems with intercourse or other gynecologic concerns. Accepts STI screen.  Having some stomach/GI issues and seeing GI next month.   Gynecologic History No LMP recorded. Patient has had an implant. Contraception: tubal ligation and Nexplanon Last Pap: 10/2017. Results: normal Last mammogram: 04/2020. Results: Bir[facility] 1 DEXA: has never had  Obstetric History OB History  Gravida Para Term Preterm AB Living  2 2 2  0 0 2  SAB IAB Ectopic Multiple Live Births  0 0 0 0      # Outcome Date GA Lbr Len/2nd Weight Sex Delivery Anes PTL Lv  2 Term           1 Term             Past Medical History:  Diagnosis Date  . Anemia   . Arthritis of left knee   . Astigmatism   . Depression   . Diabetes mellitus without complication ([place])   . Dry mouth   . Fatigue   . GERD (gastroesophageal reflux disease)   . Heartburn   . High cholesterol   . Hypertension   . Keratoconjunctivitis   . Muscle pain   . Vitamin D deficiency   . Weakness     Past Surgical History:  Procedure Laterality Date  . DILATION AND CURETTAGE OF UTERUS    . ENDOMETRIAL BIOPSY  12/19/2017   normal per pt  . TONSILLECTOMY    . TUBAL LIGATION      Current Outpatient Medications on File Prior to Visit  Medication Sig Dispense Refill  . amLODipine (NORVASC) 10 MG tablet Take 1 tablet (10 mg total) by mouth daily. 90 tablet 1  . Dapagliflozin-metFORMIN HCl ER 02-999 MG TB24 Take by mouth.    . Dulaglutide 1.5 MG/0.5ML SOPN Inject into the skin.    [name] escitalopram  (LEXAPRO) 20 MG tablet Take 1 tablet (20 mg total) by mouth daily. 30 tablet 1  . etonogestrel (NEXPLANON) 68 MG IMPL implant 1 each by Subdermal route once.    . famotidine (PEPCID) 20 MG tablet Take 1 tablet (20 mg total) by mouth daily. 30 tablet 3  . hydrOXYzine (ATARAX/VISTARIL) 25 MG tablet Take 1 tablet (25 mg total) by mouth at bedtime as needed for anxiety (and insomnia). 30 tablet 3  . lisinopril (ZESTRIL) 40 MG tablet Take 1 tablet (40 mg total) by mouth daily. 90 tablet 1  . lovastatin (MEVACOR) 40 MG tablet TAKE 1 TABLET (40 MG TOTAL) BY MOUTH AT BEDTIME. 90 tablet 1  . omeprazole (PRILOSEC) 40 MG capsule Take 1 capsule (40 mg total) by mouth daily. 30 capsule 3  . ondansetron (ZOFRAN ODT) 4 MG disintegrating tablet Take 1 tablet (4 mg total) by mouth every 8 (eight) hours as needed. 20 tablet 0  . oxyCODONE-acetaminophen (PERCOCET/ROXICET) 5-325 MG tablet Take 1 tablet by mouth every 6 (six) hours as needed for severe pain. 10 tablet 0  . sucralfate (CARAFATE) 1 g tablet Take 1 tablet (1 g total) by mouth 4 (four) times daily -  with meals and at bedtime.  60 tablet 0  . sucralfate (CARAFATE) 1 g tablet 1 tab po tid 90 tablet 0   No current facility-administered medications on file prior to visit.    No Known Allergies  Social History   Socioeconomic History  . Marital status: Married    Spouse name: [name]  . Number of children: 2  . Years of education: Not on file  . Highest education level: Not on file  Occupational History  . Occupation: CMA - Economist: [employer]  Tobacco Use  . Smoking status: Current Every Day Smoker    Years: 3.00  . Smokeless tobacco: Never Used  . Tobacco comment: 4-5/day  Vaping Use  . Vaping Use: Never used  Substance and Sexual Activity  . Alcohol use: No  . Drug use: No  . Sexual activity: Yes    Partners: Male    Birth control/protection: Surgical, Implant  Other Topics Concern  . Not on file  Social History  Narrative    2 children   82- son Allen Kell   2000- son Insurance claims handler in [place] in [place]   Enjoys reading   Widowed 08/09/2023, husband died from COVID-17.   Social Determinants of Health   Financial Resource Strain: Not on file  Food Insecurity: Not on file  Transportation Needs: Not on file  Physical Activity: Not on file  Stress: Not on file  Social Connections: Not on file  Intimate Partner Violence: Not on file    Family History  Problem Relation Age of Onset  . Diabetes Mother   . Hypertension Mother   . Hyperlipidemia Mother   . Obesity Mother   . Ovarian cancer Maternal Grandmother   . HIV Father        died from complications (IVDU)  . Alcohol abuse Father   . Drug abuse Father     The following portions of the patient's history were reviewed and updated as appropriate: allergies, current medications, past family history, past medical history, past social history, past surgical history and problem list.  Review of Systems Pertinent items are noted in HPI.   Objective:  BP 140/81   Pulse 89   Ht 5\' 3"  (1.6 m)   Wt 288 lb (130.6 kg)   BMI 51.02 kg/m  CONSTITUTIONAL: Well-developed, well-nourished female in no acute distress.  HENT:  Normocephalic, atraumatic, External right and left ear normal. Oropharynx is clear and moist EYES: Conjunctivae and EOM are normal. Pupils are equal, round, and reactive to light. No scleral icterus.  NECK: Normal range of motion, supple, no masses.  Normal thyroid.  SKIN: Skin is warm and dry. No rash noted. Not diaphoretic. No erythema. No pallor. NEUROLOGIC: Alert and oriented to person, place, and time. Normal reflexes, muscle tone coordination. No cranial nerve deficit noted. PSYCHIATRIC: Normal mood and affect. Normal behavior. Normal judgment and thought content. CARDIOVASCULAR: Normal heart rate noted RESPIRATORY: ffort normal, no problems with respiration noted. BREASTS: Symmetric in size. No masses,  skin changes, nipple drainage, or lymphadenopathy. ABDOMEN: Soft, no distention noted.  No tenderness, rebound or guarding.  PELVIC: Normal appearing external genitalia; normal appearing vaginal mucosa and cervix.  No abnormal discharge noted.  Pap smear obtained. Normal uterine size, no other palpable masses, no uterine or adnexal tenderness. MUSCULOSKELETAL: Normal range of motion. No tenderness.  No cyanosis, clubbing, or edema.  2+ distal pulses. Nexplanon palpable in left upper extremity  Exam done with chaperone present.   Assessment and  Plan:  1. Routine screening for STI (sexually transmitted infection) Has already had HIV/Hep C done recently - [facility] - Hepatitis B surface antigen  2. Well woman exam Healthy female exam  3. Cervical cancer screening  4. Nexplanon in place palpable in place Due for removal/exchange in 2 years Call if needs micronor for extended bleeding   Will follow up results of pap smear/STI screen and manage accordingly. Encouraged improvement in diet and exercise.  COVID vaccine UTD Accepts STI screen. Mammogram UTD Referral for colonoscopy n/a DEXA not due based on age  Routine preventative health maintenance measures emphasized. Please refer to After Visit Summary for other counseling recommendations.     Feliz Beam, MD, [place] for [employer] [facility])

## 2020-12-26 LAB — CYTOLOGY - PAP
Chlamydia: NEGATIVE
Comment: NEGATIVE
Comment: NEGATIVE
Comment: NORMAL
Diagnosis: NEGATIVE
High risk HPV: NEGATIVE
Neisseria Gonorrhea: NEGATIVE

## 2020-12-26 LAB — RPR: RPR Ser Ql: NONREACTIVE

## 2020-12-26 LAB — HEPATITIS B SURFACE ANTIGEN: Hepatitis B Surface Ag: NONREACTIVE

## 2020-12-27 ENCOUNTER — Other Ambulatory Visit: Payer: Self-pay

## 2020-12-27 ENCOUNTER — Encounter: Payer: Self-pay | Admitting: Family

## 2020-12-27 ENCOUNTER — Ambulatory Visit (INDEPENDENT_AMBULATORY_CARE_PROVIDER_SITE_OTHER): Payer: No Typology Code available for payment source | Admitting: Family

## 2020-12-27 ENCOUNTER — Telehealth: Payer: Self-pay | Admitting: Family

## 2020-12-27 VITALS — BP 129/85 | HR 101 | Temp 98.6°F | Resp 16 | Ht 63.0 in | Wt 292.0 lb

## 2020-12-27 DIAGNOSIS — R1013 Epigastric pain: Secondary | ICD-10-CM

## 2020-12-27 DIAGNOSIS — R1011 Right upper quadrant pain: Secondary | ICD-10-CM

## 2020-12-27 DIAGNOSIS — K5900 Constipation, unspecified: Secondary | ICD-10-CM

## 2020-12-27 LAB — CBC WITH DIFFERENTIAL/PLATELET
Basophils Absolute: 0.1 10*3/uL (ref 0.0–0.1)
Basophils Relative: 1.2 % (ref 0.0–3.0)
Eosinophils Absolute: 0.1 10*3/uL (ref 0.0–0.7)
Eosinophils Relative: 1.1 % (ref 0.0–5.0)
HCT: 39.2 % (ref 36.0–46.0)
Hemoglobin: 12.4 g/dL (ref 12.0–15.0)
Lymphocytes Relative: 29 % (ref 12.0–46.0)
Lymphs Abs: 2.4 10*3/uL (ref 0.7–4.0)
MCHC: 31.6 g/dL (ref 30.0–36.0)
MCV: 75 fl — ABNORMAL LOW (ref 78.0–100.0)
Monocytes Absolute: 0.4 10*3/uL (ref 0.1–1.0)
Monocytes Relative: 5 % (ref 3.0–12.0)
Neutro Abs: 5.2 10*3/uL (ref 1.4–7.7)
Neutrophils Relative %: 63.7 % (ref 43.0–77.0)
Platelets: 245 10*3/uL (ref 150.0–400.0)
RBC: 5.23 Mil/uL — ABNORMAL HIGH (ref 3.87–5.11)
RDW: 15.1 % (ref 11.5–15.5)
WBC: 8.1 10*3/uL (ref 4.0–10.5)

## 2020-12-27 LAB — LIPASE: Lipase: 59 U/L (ref 11.0–59.0)

## 2020-12-27 NOTE — Telephone Encounter (Signed)
See mychart.  

## 2020-12-27 NOTE — Progress Notes (Signed)
Subjective:    Patient ID: Elizabeth [name], female    DOB: November 05, 1975, 45 y.o.   MRN: [id number]  HPI  Patient is a 45 yr old female who presents today for ER follow up. She presented with epigastric pain to the ED on 3/2.  She had mild elevation of her lipase. She still has some nausea (90% of the day).  Mild improvement with the zofran.  Epigastric pain has improved.  She is using tylenol PRN pain which helps. Pain is worse at night. She is scheduled to see GI on 4/11. Last BM was 3/7, having about 2 bm's a week. Typically she moves her bowels daily.    She is on on Trulicity- which is being prescribed by Dr. Meredith Pel, has been on for 4-5 years.    Review of Systems See HPI  Past Medical History:  Diagnosis Date  . Anemia   . Arthritis of left knee   . Astigmatism   . Depression   . Diabetes mellitus without complication ([place])   . Dry mouth   . Fatigue   . GERD (gastroesophageal reflux disease)   . Heartburn   . High cholesterol   . Hypertension   . Keratoconjunctivitis   . Muscle pain   . Vitamin D deficiency   . Weakness      Social History   Socioeconomic History  . Marital status: Married    Spouse name: [name]  . Number of children: 2  . Years of education: Not on file  . Highest education level: Not on file  Occupational History  . Occupation: CMA - Economist: [employer]  Tobacco Use  . Smoking status: Current Every Day Smoker    Years: 3.00  . Smokeless tobacco: Never Used  . Tobacco comment: 4-5/day  Vaping Use  . Vaping Use: Never used  Substance and Sexual Activity  . Alcohol use: No  . Drug use: No  . Sexual activity: Yes    Partners: Male    Birth control/protection: Surgical, Implant  Other Topics Concern  . Not on file  Social History Narrative    2 children   23- son Allen Kell   2000- son Insurance claims handler in [place] in [place]   Enjoys reading   Widowed 07/14/23, husband died from COVID-69.   Social  Determinants of Health   Financial Resource Strain: Not on file  Food Insecurity: Not on file  Transportation Needs: Not on file  Physical Activity: Not on file  Stress: Not on file  Social Connections: Not on file  Intimate Partner Violence: Not on file    Past Surgical History:  Procedure Laterality Date  . DILATION AND CURETTAGE OF UTERUS    . ENDOMETRIAL BIOPSY  12/19/2017   normal per pt  . TONSILLECTOMY    . TUBAL LIGATION      Family History  Problem Relation Age of Onset  . Diabetes Mother   . Hypertension Mother   . Hyperlipidemia Mother   . Obesity Mother   . Ovarian cancer Maternal Grandmother   . HIV Father        died from complications (IVDU)  . Alcohol abuse Father   . Drug abuse Father     No Known Allergies  Current Outpatient Medications on File Prior to Visit  Medication Sig Dispense Refill  . amLODipine (NORVASC) 10 MG tablet Take 1 tablet (10 mg total) by mouth daily. 90 tablet 1  . Dapagliflozin-metFORMIN  HCl ER 02-999 MG TB24 Take by mouth.    . Dulaglutide 1.5 MG/0.5ML SOPN Inject into the skin.    [name] escitalopram (LEXAPRO) 20 MG tablet Take 1 tablet (20 mg total) by mouth daily. 30 tablet 1  . etonogestrel (NEXPLANON) 68 MG IMPL implant 1 each by Subdermal route once.    . famotidine (PEPCID) 20 MG tablet Take 1 tablet (20 mg total) by mouth daily. 30 tablet 3  . hydrOXYzine (ATARAX/VISTARIL) 25 MG tablet Take 1 tablet (25 mg total) by mouth at bedtime as needed for anxiety (and insomnia). 30 tablet 3  . lisinopril (ZESTRIL) 40 MG tablet Take 1 tablet (40 mg total) by mouth daily. 90 tablet 1  . lovastatin (MEVACOR) 40 MG tablet TAKE 1 TABLET (40 MG TOTAL) BY MOUTH AT BEDTIME. 90 tablet 1  . omeprazole (PRILOSEC) 40 MG capsule Take 1 capsule (40 mg total) by mouth daily. 30 capsule 3  . ondansetron (ZOFRAN ODT) 4 MG disintegrating tablet Take 1 tablet (4 mg total) by mouth every 8 (eight) hours as needed. 20 tablet 0  . oxyCODONE-acetaminophen  (PERCOCET/ROXICET) 5-325 MG tablet Take 1 tablet by mouth every 6 (six) hours as needed for severe pain. 10 tablet 0  . sucralfate (CARAFATE) 1 g tablet Take 1 tablet (1 g total) by mouth 4 (four) times daily -  with meals and at bedtime. 60 tablet 0  . sucralfate (CARAFATE) 1 g tablet 1 tab po tid 90 tablet 0   No current facility-administered medications on file prior to visit.    BP 129/85 (BP Location: Right Arm, Patient Position: Sitting, Cuff Size: Large)   Pulse (!) 101   Temp 98.6 F (37 C) (Oral)   Resp 16   Ht 5\' 3"  (1.6 m)   Wt 292 lb (132.5 kg)   SpO2 100%   BMI 51.73 kg/m       Objective:   Physical Exam Constitutional:      Appearance: She is well-developed and well-nourished.  Neck:     Thyroid: No thyromegaly.  Cardiovascular:     Rate and Rhythm: Normal rate and regular rhythm.     Heart sounds: Normal heart sounds. No murmur heard.   Pulmonary:     Effort: Pulmonary effort is normal. No respiratory distress.     Breath sounds: Normal breath sounds. No wheezing.  Abdominal:     Tenderness: There is abdominal tenderness in the right upper quadrant and epigastric area. Positive signs include Murphy's sign.  Musculoskeletal:     Cervical back: Neck supple.  Skin:    General: Skin is warm and dry.  Neurological:     Mental Status: She is alert and oriented to person, place, and time.  Psychiatric:        Mood and Affect: Mood and affect normal.        Behavior: Behavior normal.        Thought Content: Thought content normal.        Judgment: Judgment normal.           Assessment & Plan:  RUQ pain with nausea- highly suspicious for cholecystitis. Will check CBC, LFT, lipase and abdominal ultrasound. Likely surgical referral pending review of [name] results.  Pt is advised to return to the ER if she develops severe/worsening pain or inability to keep down food/liquid. Pt verbalizes understanding.  Constipation- will add miralax prn.    This visit  occurred during the SARS-CoV-2 public health emergency.  Safety protocols were in  place, including screening questions prior to the visit, additional usage of staff PPE, and extensive cleaning of exam room while observing appropriate contact time as indicated for disinfecting solutions.

## 2020-12-27 NOTE — Patient Instructions (Signed)
Please complete lab work prior to leaving. You should be contacted about scheduling your appointment for your abdominal ultrasound.  Go to the ER if you develop severe pain, or inability to keep down food/liquid.

## 2020-12-28 ENCOUNTER — Ambulatory Visit (HOSPITAL_BASED_OUTPATIENT_CLINIC_OR_DEPARTMENT_OTHER)
Admission: RE | Admit: 2020-12-28 | Discharge: 2020-12-28 | Disposition: A | Discharge status: Home / Self Care | Payer: No Typology Code available for payment source | Source: Ambulatory Visit | Attending: Family | Admitting: Family

## 2020-12-28 DIAGNOSIS — R1013 Epigastric pain: Secondary | ICD-10-CM | POA: Diagnosis present

## 2020-12-28 MED FILL — TRULICITY 1.5 MG/0.5 ML PEN: 1.5 | 28 days supply | Qty: 2 | Fill #1

## 2020-12-31 ENCOUNTER — Telehealth: Payer: Self-pay | Admitting: Family

## 2020-12-31 DIAGNOSIS — R1011 Right upper quadrant pain: Secondary | ICD-10-CM

## 2020-12-31 NOTE — Telephone Encounter (Signed)
See mychart.  

## 2021-01-02 ENCOUNTER — Other Ambulatory Visit: Payer: Self-pay | Admitting: Family

## 2021-01-02 MED ORDER — ONDANSETRON 4 MG PO TBDP: 4.0000 mg | ORAL_TABLET | Freq: Three times a day (TID) | ORAL | 0 refills | Status: DC | PRN

## 2021-01-02 MED ORDER — TRAMADOL HCL 50 MG PO TABS: 50.0000 mg | ORAL_TABLET | Freq: Three times a day (TID) | ORAL | 0 refills | Status: DC | PRN

## 2021-01-02 NOTE — Addendum Note (Signed)
Addended by: [name] on: 01/02/2021 07:30 AM   Modules accepted: Orders

## 2021-01-08 ENCOUNTER — Encounter: Payer: Self-pay | Admitting: Family

## 2021-01-08 DIAGNOSIS — R1011 Right upper quadrant pain: Secondary | ICD-10-CM

## 2021-01-09 ENCOUNTER — Ambulatory Visit (INDEPENDENT_AMBULATORY_CARE_PROVIDER_SITE_OTHER): Payer: No Typology Code available for payment source | Admitting: Gastroenterology

## 2021-01-09 ENCOUNTER — Encounter: Payer: Self-pay | Admitting: Gastroenterology

## 2021-01-09 ENCOUNTER — Other Ambulatory Visit: Payer: Self-pay

## 2021-01-09 ENCOUNTER — Other Ambulatory Visit: Payer: Self-pay | Admitting: Gastroenterology

## 2021-01-09 VITALS — BP 120/72 | HR 107 | Ht 62.0 in | Wt 290.2 lb

## 2021-01-09 DIAGNOSIS — K219 Gastro-esophageal reflux disease without esophagitis: Secondary | ICD-10-CM

## 2021-01-09 DIAGNOSIS — R110 Nausea: Secondary | ICD-10-CM

## 2021-01-09 DIAGNOSIS — Z6841 Body Mass Index (BMI) 40.0 and over, adult: Secondary | ICD-10-CM

## 2021-01-09 DIAGNOSIS — R1011 Right upper quadrant pain: Secondary | ICD-10-CM | POA: Diagnosis not present

## 2021-01-09 DIAGNOSIS — R1013 Epigastric pain: Secondary | ICD-10-CM | POA: Diagnosis not present

## 2021-01-09 DIAGNOSIS — K5900 Constipation, unspecified: Secondary | ICD-10-CM

## 2021-01-09 MED ORDER — ONDANSETRON 4 MG PO TBDP: 4.0000 mg | ORAL_TABLET | Freq: Three times a day (TID) | ORAL | 3 refills | Status: DC | PRN

## 2021-01-09 MED ORDER — PANTOPRAZOLE SODIUM 40 MG PO TBEC: DELAYED_RELEASE_TABLET | ORAL | 3 refills | Status: DC

## 2021-01-09 MED FILL — ONDANSETRON ODT 4 MG TABLET: 4 | 10 days supply | Qty: 30 | Fill #0

## 2021-01-09 MED FILL — PANTOPRAZOLE SOD DR 40 MG T: 40 | 30 days supply | Qty: 60 | Fill #0

## 2021-01-09 NOTE — Patient Instructions (Addendum)
If you are age 44 or older, your body mass index should be between 23-30. Your Body mass index is 53.09 kg/m. If this is out of the aforementioned range listed, please consider follow up with your Primary Care Provider.  If you are age 39 or younger, your body mass index should be between 19-25. Your Body mass index is 53.09 kg/m. If this is out of the aformentioned range listed, please consider follow up with your Primary Care Provider.   We have sent the following medications to your pharmacy for you to pick up at your convenience:  Protonix 40mg  twice a day for 4 weeks then titrate back to 40 mg daily. Stop taking your Prilosec  Take Miralax 1 capful mixed in 8 ounces of water at bed time for constipation as tolerated.  Due to recent changes in healthcare laws, you may see the results of your imaging and laboratory studies on MyChart before your provider has had a chance to review them.  We understand that in some cases there may be results that are confusing or concerning to you. Not all laboratory results come back in the same time frame and the provider may be waiting for multiple results in order to interpret others.  Please give [name] 48 hours in order for your provider to thoroughly review all the results before contacting the office for clarification of your results.   Your Endoscopy is scheduled at [facility] for 01/18/2021. You will need to have a covid test Monday 01/15/2021. All instuctions have been sent to your mychart.  Thank you for choosing me and [facility].  Vito Cirigliano, D.O.

## 2021-01-09 NOTE — Progress Notes (Signed)
Chief Complaint: RUQ/MEG pain, Heartburn, nausea, constipation  Referring Provider:     [name], NP    HPI:     Elizabeth [name] is a 45 y.o. female with a history of diabetes, HTN, obesity (BMI 82), hyperlipidemia, GERD, depression, referred to the Gastroenterology Clinic for evaluation of abdominal pain, reflux symptoms, nausea, and separately constipation.  -Evaluation in [facility] on 12/13/2020 with worsening chronic reflux sxs (heartburn, regurgitation), along with new onset nausea, and MEG pain. No dysphagia. Mildly elevated lipase (69), otherwise unremarkable CBC and CMP.  Negative H. pylori antibody. Improvement with GI cocktail in office. Increased omeprazole to 40 mg/day and added Pepcid 20 mg/day with improvement in reflux sxs.   -Subsequent ER eval on 12/20/2020 for ongoing MEG pain, nausea. Eval unremarkable, to include normalized lipase, normal CBC (chronic microcytosis without anemia), CMP unremarkable. Started carafate QID and Zofran.     -Was seen by her PCM on 12/27/2020.  Pain was improving, and only mild improvement in nausea.  Repeat lipase was normal, with normal CBC.  Abdominal ultrasound on 12/28/2020 with hepatic steatosis, and otherwise unremarkable.  Scheduled for HIDA scan on 01/15/2021.  Today, she states she continues to have intermittent sharp stabbing RUQ pain along with continued MEG pain and nausea w/o emesis.  Pain worse with fatty, fried foods.    Separately, reports constipation for last few weeks described as decreased stool frequency and straining. Now Q2-3 days (baseline was 1 BM/day). +pellet likes stools. Started Miralax over last 2 weeks with some improvement, but not complete relief. No prior hx of constipation.  Started after Rx for Percocet from ER on 12/20/20.   No prior EGD or colonoscopy.   No known family history of CRC, GI malignancy, liver disease, pancreatic disease, or IBD.    Past Medical History:  Diagnosis  Date  . Anemia   . Arthritis of left knee   . Astigmatism   . Depression   . Diabetes mellitus without complication ([place])   . Dry mouth   . Fatigue   . GERD (gastroesophageal reflux disease)   . Heartburn   . High cholesterol   . Hypertension   . Keratoconjunctivitis   . Muscle pain   . Vitamin D deficiency   . Weakness      Past Surgical History:  Procedure Laterality Date  . DILATION AND CURETTAGE OF UTERUS    . ENDOMETRIAL BIOPSY  12/19/2017   normal per pt  . TONSILLECTOMY    . TUBAL LIGATION     Family History  Problem Relation Age of Onset  . Diabetes Mother   . Hypertension Mother   . Hyperlipidemia Mother   . Obesity Mother   . Ovarian cancer Maternal Grandmother   . HIV Father        died from complications (IVDU)  . Alcohol abuse Father   . Drug abuse Father    Social History   Tobacco Use  . Smoking status: Current Every Day Smoker    Years: 3.00  . Smokeless tobacco: Never Used  . Tobacco comment: 4-5/day  Vaping Use  . Vaping Use: Never used  Substance Use Topics  . Alcohol use: No  . Drug use: No   Current Outpatient Medications  Medication Sig Dispense Refill  . amLODipine (NORVASC) 10 MG tablet Take 1 tablet (10 mg total) by mouth daily. 90 tablet 1  . Dapagliflozin-metFORMIN HCl ER 02-999 MG TB24  Take by mouth.    . Dulaglutide 1.5 MG/0.5ML SOPN Inject into the skin.    [name] escitalopram (LEXAPRO) 20 MG tablet Take 1 tablet (20 mg total) by mouth daily. 30 tablet 1  . etonogestrel (NEXPLANON) 68 MG IMPL implant 1 each by Subdermal route once.    . famotidine (PEPCID) 20 MG tablet Take 1 tablet (20 mg total) by mouth daily. 30 tablet 3  . hydrOXYzine (ATARAX/VISTARIL) 25 MG tablet Take 1 tablet (25 mg total) by mouth at bedtime as needed for anxiety (and insomnia). 30 tablet 3  . lisinopril (ZESTRIL) 40 MG tablet Take 1 tablet (40 mg total) by mouth daily. 90 tablet 1  . lovastatin (MEVACOR) 40 MG tablet TAKE 1 TABLET (40 MG TOTAL) BY MOUTH  AT BEDTIME. 90 tablet 1  . omeprazole (PRILOSEC) 40 MG capsule Take 1 capsule (40 mg total) by mouth daily. 30 capsule 3  . ondansetron (ZOFRAN ODT) 4 MG disintegrating tablet Take 1 tablet (4 mg total) by mouth every 8 (eight) hours as needed. 30 tablet 3  . sucralfate (CARAFATE) 1 g tablet 1 tab po tid 90 tablet 0  . sucralfate (CARAFATE) 1 g tablet Take 1 tablet (1 g total) by mouth 4 (four) times daily -  with meals and at bedtime. 60 tablet 0   No current facility-administered medications for this visit.   No Known Allergies   Review of Systems: All systems reviewed and negative except where noted in HPI.     Physical Exam:    Wt Readings from Last 3 Encounters:  01/09/21 290 lb 4 oz (131.7 kg)  12/27/20 292 lb (132.5 kg)  12/25/20 288 lb (130.6 kg)    Ht 5\' 2"  (1.575 m)   Wt 290 lb 4 oz (131.7 kg)   BMI 53.09 kg/m  Constitutional:  Pleasant, in no acute distress. Psychiatric: Normal mood and affect. Behavior is normal. EENT: Pupils normal.  Conjunctivae are normal. No scleral icterus. Neck supple. No cervical LAD. Cardiovascular: Normal rate, regular rhythm. No edema Pulmonary/chest: Effort normal and breath sounds normal. No wheezing, rales or rhonchi. Abdominal: Soft, nondistended, nontender. Bowel sounds active throughout. There are no masses palpable. No hepatomegaly. Neurological: Alert and oriented to person place and time. Skin: Skin is warm and dry. No rashes noted.   ASSESSMENT AND PLAN;   1) Epigastric pain 2) Nausea 3) Reflux symptoms -EGD to evaluate for erosive esophagitis along with evaluation for PUD, gastritis with random directed biopsies -Change Prilosec to Protonix 40 mg p.o. twice daily x4 weeks for diagnostic and therapeutic intent -Okay to continue Pepcid for now -Okay to continue Zofran as needed -If work-up unrevealing and symptoms persist, plan for cross-sectional imaging +/-GES  4) RUQ pain -Abdominal ultrasound largely  unrevealing -Scheduled for HIDA next week -Evaluate for UGI pathology at time of EGD as above  5) Constipation -Started after taking pain medications for abdominal pain as above.  No prior existing history of constipation.  Has had some response to intermittent use of MiraLAX -Take MiraLAX 1 cap/day.  Can uptitrate as needed.  Once having regular, soft stools, plan to titrate down to effect, and eventually titrate off -Ensure drinking 64 ounces of water per day -No longer taking any pain medications  6) Morbid obesity -Morbid obesity places her at increased perioperative risks period plan for upper endoscopy completed at [facility]  The indications, risks, and benefits of EGD were explained to the patient in detail. Risks include but are not limited to  bleeding, perforation, adverse reaction to medications, and cardiopulmonary compromise. Sequelae include but are not limited to the possibility of surgery, hospitalization, and mortality. The patient verbalized understanding and wished to proceed. All questions answered, referred to scheduler. Further recommendations pending results of the exam.     Lavena Bullion, DO, FACG  01/09/2021, 1:13 PM   [name], NP

## 2021-01-09 NOTE — H&P (View-Only) (Signed)
Chief Complaint: RUQ/MEG pain, Heartburn, nausea, constipation  Referring Provider:     [name], NP    HPI:     Elizabeth [name] is a 45 y.o. female with a history of diabetes, HTN, obesity (BMI 54), hyperlipidemia, GERD, depression, referred to the Gastroenterology Clinic for evaluation of abdominal pain, reflux symptoms, nausea, and separately constipation.  -Evaluation in [facility] on 12/13/2020 with worsening chronic reflux sxs (heartburn, regurgitation), along with new onset nausea, and MEG pain. No dysphagia. Mildly elevated lipase (69), otherwise unremarkable CBC and CMP.  Negative H. pylori antibody. Improvement with GI cocktail in office. Increased omeprazole to 40 mg/day and added Pepcid 20 mg/day with improvement in reflux sxs.   -Subsequent ER eval on 12/20/2020 for ongoing MEG pain, nausea. Eval unremarkable, to include normalized lipase, normal CBC (chronic microcytosis without anemia), CMP unremarkable. Started carafate QID and Zofran.     -Was seen by her PCM on 12/27/2020.  Pain was improving, and only mild improvement in nausea.  Repeat lipase was normal, with normal CBC.  Abdominal ultrasound on 12/28/2020 with hepatic steatosis, and otherwise unremarkable.  Scheduled for HIDA scan on 01/15/2021.  Today, she states she continues to have intermittent sharp stabbing RUQ pain along with continued MEG pain and nausea w/o emesis.  Pain worse with fatty, fried foods.    Separately, reports constipation for last few weeks described as decreased stool frequency and straining. Now Q2-3 days (baseline was 1 BM/day). +pellet likes stools. Started Miralax over last 2 weeks with some improvement, but not complete relief. No prior hx of constipation.  Started after Rx for Percocet from ER on 12/20/20.   No prior EGD or colonoscopy.   No known family history of CRC, GI malignancy, liver disease, pancreatic disease, or IBD.    Past Medical History:  Diagnosis  Date  . Anemia   . Arthritis of left knee   . Astigmatism   . Depression   . Diabetes mellitus without complication ([place])   . Dry mouth   . Fatigue   . GERD (gastroesophageal reflux disease)   . Heartburn   . High cholesterol   . Hypertension   . Keratoconjunctivitis   . Muscle pain   . Vitamin D deficiency   . Weakness      Past Surgical History:  Procedure Laterality Date  . DILATION AND CURETTAGE OF UTERUS    . ENDOMETRIAL BIOPSY  12/19/2017   normal per pt  . TONSILLECTOMY    . TUBAL LIGATION     Family History  Problem Relation Age of Onset  . Diabetes Mother   . Hypertension Mother   . Hyperlipidemia Mother   . Obesity Mother   . Ovarian cancer Maternal Grandmother   . HIV Father        died from complications (IVDU)  . Alcohol abuse Father   . Drug abuse Father    Social History   Tobacco Use  . Smoking status: Current Every Day Smoker    Years: 3.00  . Smokeless tobacco: Never Used  . Tobacco comment: 4-5/day  Vaping Use  . Vaping Use: Never used  Substance Use Topics  . Alcohol use: No  . Drug use: No   Current Outpatient Medications  Medication Sig Dispense Refill  . amLODipine (NORVASC) 10 MG tablet Take 1 tablet (10 mg total) by mouth daily. 90 tablet 1  . Dapagliflozin-metFORMIN HCl ER 02-999 MG TB24  Take by mouth.    . Dulaglutide 1.5 MG/0.5ML SOPN Inject into the skin.    [name] escitalopram (LEXAPRO) 20 MG tablet Take 1 tablet (20 mg total) by mouth daily. 30 tablet 1  . etonogestrel (NEXPLANON) 68 MG IMPL implant 1 each by Subdermal route once.    . famotidine (PEPCID) 20 MG tablet Take 1 tablet (20 mg total) by mouth daily. 30 tablet 3  . hydrOXYzine (ATARAX/VISTARIL) 25 MG tablet Take 1 tablet (25 mg total) by mouth at bedtime as needed for anxiety (and insomnia). 30 tablet 3  . lisinopril (ZESTRIL) 40 MG tablet Take 1 tablet (40 mg total) by mouth daily. 90 tablet 1  . lovastatin (MEVACOR) 40 MG tablet TAKE 1 TABLET (40 MG TOTAL) BY MOUTH  AT BEDTIME. 90 tablet 1  . omeprazole (PRILOSEC) 40 MG capsule Take 1 capsule (40 mg total) by mouth daily. 30 capsule 3  . ondansetron (ZOFRAN ODT) 4 MG disintegrating tablet Take 1 tablet (4 mg total) by mouth every 8 (eight) hours as needed. 30 tablet 3  . sucralfate (CARAFATE) 1 g tablet 1 tab po tid 90 tablet 0  . sucralfate (CARAFATE) 1 g tablet Take 1 tablet (1 g total) by mouth 4 (four) times daily -  with meals and at bedtime. 60 tablet 0   No current facility-administered medications for this visit.   No Known Allergies   Review of Systems: All systems reviewed and negative except where noted in HPI.     Physical Exam:    Wt Readings from Last 3 Encounters:  01/09/21 290 lb 4 oz (131.7 kg)  12/27/20 292 lb (132.5 kg)  12/25/20 288 lb (130.6 kg)    Ht 5\' 2"  (1.575 m)   Wt 290 lb 4 oz (131.7 kg)   BMI 53.09 kg/m  Constitutional:  Pleasant, in no acute distress. Psychiatric: Normal mood and affect. Behavior is normal. EENT: Pupils normal.  Conjunctivae are normal. No scleral icterus. Neck supple. No cervical LAD. Cardiovascular: Normal rate, regular rhythm. No edema Pulmonary/chest: Effort normal and breath sounds normal. No wheezing, rales or rhonchi. Abdominal: Soft, nondistended, nontender. Bowel sounds active throughout. There are no masses palpable. No hepatomegaly. Neurological: Alert and oriented to person place and time. Skin: Skin is warm and dry. No rashes noted.   ASSESSMENT AND PLAN;   1) Epigastric pain 2) Nausea 3) Reflux symptoms -EGD to evaluate for erosive esophagitis along with evaluation for PUD, gastritis with random directed biopsies -Change Prilosec to Protonix 40 mg p.o. twice daily x4 weeks for diagnostic and therapeutic intent -Okay to continue Pepcid for now -Okay to continue Zofran as needed -If work-up unrevealing and symptoms persist, plan for cross-sectional imaging +/-GES  4) RUQ pain -Abdominal ultrasound largely  unrevealing -Scheduled for HIDA next week -Evaluate for UGI pathology at time of EGD as above  5) Constipation -Started after taking pain medications for abdominal pain as above.  No prior existing history of constipation.  Has had some response to intermittent use of MiraLAX -Take MiraLAX 1 cap/day.  Can uptitrate as needed.  Once having regular, soft stools, plan to titrate down to effect, and eventually titrate off -Ensure drinking 64 ounces of water per day -No longer taking any pain medications  6) Morbid obesity -Morbid obesity places her at increased perioperative risks period plan for upper endoscopy completed at [facility]  The indications, risks, and benefits of EGD were explained to the patient in detail. Risks include but are not limited to  bleeding, perforation, adverse reaction to medications, and cardiopulmonary compromise. Sequelae include but are not limited to the possibility of surgery, hospitalization, and mortality. The patient verbalized understanding and wished to proceed. All questions answered, referred to scheduler. Further recommendations pending results of the exam.     Lavena Bullion, DO, FACG  01/09/2021, 1:13 PM   [name], NP

## 2021-01-11 NOTE — Progress Notes (Signed)
Attempted to obtain medical history via telephone, unable to reach at this time. I left a voicemail to return pre surgical testing department's phone call.  

## 2021-01-12 ENCOUNTER — Other Ambulatory Visit: Payer: Self-pay

## 2021-01-12 NOTE — Progress Notes (Signed)
Attempted to obtain medical history via telephone, unable to reach at this time. I left a voicemail to return pre surgical testing department's phone call.  

## 2021-01-15 ENCOUNTER — Ambulatory Visit (HOSPITAL_COMMUNITY)
Admission: RE | Admit: 2021-01-15 | Discharge: 2021-01-15 | Disposition: A | Discharge status: Home / Self Care | Payer: No Typology Code available for payment source | Source: Ambulatory Visit | Attending: Family | Admitting: Family

## 2021-01-15 ENCOUNTER — Other Ambulatory Visit: Payer: Self-pay

## 2021-01-15 ENCOUNTER — Other Ambulatory Visit (HOSPITAL_COMMUNITY)
Admission: RE | Admit: 2021-01-15 | Discharge: 2021-01-15 | Disposition: A | Discharge status: Home / Self Care | Payer: No Typology Code available for payment source | Source: Ambulatory Visit | Attending: Gastroenterology | Admitting: Gastroenterology

## 2021-01-15 DIAGNOSIS — Z20822 Contact with and (suspected) exposure to covid-19: Secondary | ICD-10-CM | POA: Insufficient documentation

## 2021-01-15 DIAGNOSIS — R1011 Right upper quadrant pain: Secondary | ICD-10-CM | POA: Diagnosis not present

## 2021-01-15 DIAGNOSIS — Z01812 Encounter for preprocedural laboratory examination: Secondary | ICD-10-CM | POA: Insufficient documentation

## 2021-01-15 LAB — SARS CORONAVIRUS 2 (TAT 6-24 HRS): SARS Coronavirus 2: NEGATIVE

## 2021-01-15 MED ORDER — TECHNETIUM TC 99M MEBROFENIN IV KIT
5.4000 | PACK | Freq: Once | INTRAVENOUS | Status: AC | PRN
  Administered 2021-01-15: 5.4 via INTRAVENOUS

## 2021-01-16 ENCOUNTER — Telehealth: Payer: Self-pay | Admitting: Family

## 2021-01-16 ENCOUNTER — Other Ambulatory Visit: Payer: Self-pay | Admitting: General Surgery

## 2021-01-16 DIAGNOSIS — K219 Gastro-esophageal reflux disease without esophagitis: Secondary | ICD-10-CM

## 2021-01-16 DIAGNOSIS — R1013 Epigastric pain: Secondary | ICD-10-CM

## 2021-01-16 MED ORDER — TRAMADOL HCL 50 MG PO TABS: 50.0000 mg | ORAL_TABLET | Freq: Four times a day (QID) | ORAL | 0 refills | Status: DC | PRN

## 2021-01-16 MED FILL — traMADol HCL 50 MG TABS: 50 | 5 days supply | Qty: 20 | Fill #0

## 2021-01-16 NOTE — Telephone Encounter (Signed)
Spoke to pt. Reviewed normal GB function per HIDA results. She has EGD scheduled for 3/31 and surgery consult scheduled for 4/9. Using tylenol or ibuprofen as needed for pain. Has been missing work due to [place].  Advised pt that I would send a refill of tramadol for now. Will see what we learn from her EGD/Surgical consult. In the meantime, she plans to apply for FMLA for her GI issues and states that she has already sent the paperwork to our office.

## 2021-01-16 NOTE — Telephone Encounter (Signed)
See mychart.  

## 2021-01-17 DIAGNOSIS — Z0279 Encounter for issue of other medical certificate: Secondary | ICD-10-CM

## 2021-01-17 MED FILL — ESCITALOPRAM 10 MG TABLET: 10 | 90 days supply | Qty: 90 | Fill #1

## 2021-01-17 MED FILL — ONDANSETRON ODT 4 MG TABLET: 4 | 3 days supply | Qty: 30 | Fill #1

## 2021-01-18 ENCOUNTER — Other Ambulatory Visit: Payer: Self-pay

## 2021-01-18 ENCOUNTER — Ambulatory Visit (HOSPITAL_COMMUNITY): Payer: No Typology Code available for payment source | Admitting: Anesthesiology

## 2021-01-18 ENCOUNTER — Encounter (HOSPITAL_COMMUNITY)
Admission: RE | Disposition: A | Discharge status: Home / Self Care | Payer: Self-pay | Source: Home / Self Care | Attending: Gastroenterology

## 2021-01-18 ENCOUNTER — Encounter (HOSPITAL_COMMUNITY): Payer: Self-pay | Admitting: Gastroenterology

## 2021-01-18 ENCOUNTER — Ambulatory Visit (HOSPITAL_COMMUNITY)
Admission: RE | Admit: 2021-01-18 | Discharge: 2021-01-18 | Disposition: A | Discharge status: Home / Self Care | Payer: No Typology Code available for payment source | Attending: Gastroenterology | Admitting: Gastroenterology

## 2021-01-18 DIAGNOSIS — Z8349 Family history of other endocrine, nutritional and metabolic diseases: Secondary | ICD-10-CM | POA: Diagnosis not present

## 2021-01-18 DIAGNOSIS — Z830 Family history of human immunodeficiency virus [HIV] disease: Secondary | ICD-10-CM | POA: Insufficient documentation

## 2021-01-18 DIAGNOSIS — Z813 Family history of other psychoactive substance abuse and dependence: Secondary | ICD-10-CM | POA: Diagnosis not present

## 2021-01-18 DIAGNOSIS — Z79899 Other long term (current) drug therapy: Secondary | ICD-10-CM | POA: Diagnosis not present

## 2021-01-18 DIAGNOSIS — I10 Essential (primary) hypertension: Secondary | ICD-10-CM | POA: Insufficient documentation

## 2021-01-18 DIAGNOSIS — F32A Depression, unspecified: Secondary | ICD-10-CM | POA: Insufficient documentation

## 2021-01-18 DIAGNOSIS — E119 Type 2 diabetes mellitus without complications: Secondary | ICD-10-CM | POA: Diagnosis not present

## 2021-01-18 DIAGNOSIS — K760 Fatty (change of) liver, not elsewhere classified: Secondary | ICD-10-CM | POA: Insufficient documentation

## 2021-01-18 DIAGNOSIS — F17200 Nicotine dependence, unspecified, uncomplicated: Secondary | ICD-10-CM | POA: Insufficient documentation

## 2021-01-18 DIAGNOSIS — K449 Diaphragmatic hernia without obstruction or gangrene: Secondary | ICD-10-CM | POA: Insufficient documentation

## 2021-01-18 DIAGNOSIS — R1013 Epigastric pain: Secondary | ICD-10-CM | POA: Insufficient documentation

## 2021-01-18 DIAGNOSIS — Z6841 Body Mass Index (BMI) 40.0 and over, adult: Secondary | ICD-10-CM | POA: Insufficient documentation

## 2021-01-18 DIAGNOSIS — K219 Gastro-esophageal reflux disease without esophagitis: Secondary | ICD-10-CM | POA: Insufficient documentation

## 2021-01-18 DIAGNOSIS — E785 Hyperlipidemia, unspecified: Secondary | ICD-10-CM | POA: Insufficient documentation

## 2021-01-18 DIAGNOSIS — K5900 Constipation, unspecified: Secondary | ICD-10-CM | POA: Insufficient documentation

## 2021-01-18 DIAGNOSIS — Z811 Family history of alcohol abuse and dependence: Secondary | ICD-10-CM | POA: Insufficient documentation

## 2021-01-18 DIAGNOSIS — Z833 Family history of diabetes mellitus: Secondary | ICD-10-CM | POA: Diagnosis not present

## 2021-01-18 DIAGNOSIS — K2289 Other specified disease of esophagus: Secondary | ICD-10-CM | POA: Diagnosis not present

## 2021-01-18 DIAGNOSIS — Z7984 Long term (current) use of oral hypoglycemic drugs: Secondary | ICD-10-CM | POA: Insufficient documentation

## 2021-01-18 DIAGNOSIS — Z8249 Family history of ischemic heart disease and other diseases of the circulatory system: Secondary | ICD-10-CM | POA: Insufficient documentation

## 2021-01-18 DIAGNOSIS — Z8041 Family history of malignant neoplasm of ovary: Secondary | ICD-10-CM | POA: Insufficient documentation

## 2021-01-18 DIAGNOSIS — R1011 Right upper quadrant pain: Secondary | ICD-10-CM | POA: Insufficient documentation

## 2021-01-18 HISTORY — PX: ESOPHAGOGASTRODUODENOSCOPY (EGD) WITH PROPOFOL: SHX5813

## 2021-01-18 HISTORY — PX: BIOPSY: SHX5522

## 2021-01-18 LAB — GLUCOSE, CAPILLARY: Glucose-Capillary: 143 mg/dL — ABNORMAL HIGH (ref 70–99)

## 2021-01-18 SURGERY — ESOPHAGOGASTRODUODENOSCOPY (EGD) WITH PROPOFOL
Anesthesia: Monitor Anesthesia Care

## 2021-01-18 MED ORDER — LACTATED RINGERS IV SOLN: INTRAVENOUS | Status: DC

## 2021-01-18 MED ORDER — SODIUM CHLORIDE 0.9 % IV SOLN: INTRAVENOUS | Status: DC

## 2021-01-18 MED ORDER — ONDANSETRON HCL 4 MG/2ML IJ SOLN
INTRAMUSCULAR | Status: AC
  Filled 2021-01-18: qty 2

## 2021-01-18 MED ORDER — ONDANSETRON HCL 4 MG/2ML IJ SOLN
4.0000 mg | Freq: Once | INTRAMUSCULAR | Status: AC
  Administered 2021-01-18: 4 mg via INTRAVENOUS

## 2021-01-18 MED ORDER — PROPOFOL 500 MG/50ML IV EMUL
INTRAVENOUS | Status: DC | PRN
  Administered 2021-01-18: 150 ug/kg/min via INTRAVENOUS

## 2021-01-18 SURGICAL SUPPLY — 14 items

## 2021-01-18 NOTE — Op Note (Signed)
[facility] Patient Name: Elizabeth [name] Procedure Date: 01/18/2021 MRN: [id number] Attending MD: Gerrit Heck , MD Date of Birth: October 06, 1976 CSN: [id number] Age: 45 Admit Type: Outpatient Procedure:                Upper GI endoscopy Indications:              Epigastric abdominal pain, Abdominal pain in the                            right upper quadrant, Suspected esophageal reflux Providers:                Gerrit Heck, MD, Mariana Arn, Carmie End, RN, Ladona Ridgel, Technician, Herbie Drape, CRNA Referring MD:              Medicines:                Monitored Anesthesia Care Complications:            No immediate complications. Estimated Blood Loss:     Estimated blood loss was minimal. Procedure:                Pre-Anesthesia Assessment:                           - Prior to the procedure, a History and Physical                            was performed, and patient medications and                            allergies were reviewed. The patient's tolerance of                            previous anesthesia was also reviewed. The risks                            and benefits of the procedure and the sedation                            options and risks were discussed with the patient.                            All questions were answered, and informed consent                            was obtained. Prior Anticoagulants: The patient has                            taken no previous anticoagulant or antiplatelet  agents. ASA Grade Assessment: III - A patient with                            severe systemic disease. After reviewing the risks                            and benefits, the patient was deemed in                            satisfactory condition to undergo the procedure.                           After obtaining informed consent, the endoscope was                             passed under direct vision. Throughout the                            procedure, the patient's blood pressure, pulse, and                            oxygen saturations were monitored continuously. The                            GIF-H190 ([id number] Olympus gastroscope was                            introduced through the mouth, and advanced to the                            second part of duodenum. The upper GI endoscopy was                            accomplished without difficulty. The patient                            tolerated the procedure well. Scope In: Scope Out: Findings:      Three small islands of salmon-colored mucosa were present at 33 cm. No       other visible abnormalities were present and the Z line was otherwise       normal at 34 cm. Biopsies were taken with a cold forceps for histology.       Estimated blood loss was minimal.      A 3 cm hiatal hernia was present.      The entire examined stomach was normal. Biopsies were taken with a cold       forceps for Helicobacter pylori testing. Estimated blood loss was       minimal.      The examined duodenum was normal. Biopsies were taken with a cold       forceps for histology. Estimated blood loss was minimal. Impression:               - Thee small islands of salmon-colored mucosa in  the distal esophagus, located 1 cm proximal to an                            otherwise normal appearing Z line. Biopsied.                           - 3 cm hiatal hernia.                           - Normal stomach. Biopsied.                           - Normal examined duodenum. Biopsied. Moderate Sedation:      Not Applicable - Patient had care per Anesthesia. Recommendation:           - Patient has a contact number available for                            emergencies. The signs and symptoms of potential                            delayed complications were discussed with the                             patient. Return to normal activities tomorrow.                            Written discharge instructions were provided to the                            patient.                           - Resume previous diet.                           - Continue present medications.                           - Await pathology results. Procedure Code(s):        --- Professional ---                           [phone], Esophagogastroduodenoscopy, flexible,                            transoral; with biopsy, single or multiple Diagnosis Code(s):        --- Professional ---                           K22.8, Other specified diseases of esophagus                           K44.9, Diaphragmatic hernia without obstruction or                            gangrene  R10.13, Epigastric pain                           R10.11, Right upper quadrant pain CPT copyright 2019 American Medical Association. All rights reserved. The codes documented in this report are preliminary and upon coder review may  be revised to meet current compliance requirements. Gerrit Heck, MD 01/18/2021 9:57:58 AM Number of Addenda: 0

## 2021-01-18 NOTE — Interval H&P Note (Signed)
History and Physical Interval Note:  01/18/2021 8:18 AM  Elizabeth [name]  has presented today for surgery, with the diagnosis of Meg pain, RUQ pain, Gerd.  The various methods of treatment have been discussed with the patient and family. After consideration of risks, benefits and other options for treatment, the patient has consented to  Procedure(s): ESOPHAGOGASTRODUODENOSCOPY (EGD) WITH PROPOFOL (N/A) as a surgical intervention.  The patient's history has been reviewed, patient examined, no change in status, stable for surgery.  I have reviewed the patient's chart and labs.  Questions were answered to the patient's satisfaction.     Dominic Pea Mirian Casco

## 2021-01-18 NOTE — Anesthesia Postprocedure Evaluation (Signed)
Anesthesia Post Note  Patient: Elizabeth [name]  Procedure(s) Performed: ESOPHAGOGASTRODUODENOSCOPY (EGD) WITH PROPOFOL (N/A ) BIOPSY     Patient location during evaluation: PACU Anesthesia Type: MAC Level of consciousness: awake and alert Pain management: pain level controlled Vital Signs Assessment: post-procedure vital signs reviewed and stable Respiratory status: spontaneous breathing, nonlabored ventilation, respiratory function stable and patient connected to nasal cannula oxygen Cardiovascular status: stable and blood pressure returned to baseline Postop Assessment: no apparent nausea or vomiting Anesthetic complications: no   No complications documented.  Last Vitals:  Vitals:   01/18/21 0856 01/18/21 1000  BP: 120/75 112/77  Pulse: (!) 120 (!) 119  Resp: (!) 25 (!) 21  Temp: 36.4 C   SpO2: 95% 97%    Last Pain:  Vitals:   01/18/21 1000  TempSrc:   PainSc: 0-No pain                 Elizabeth [name] S

## 2021-01-18 NOTE — Transfer of Care (Signed)
Immediate Anesthesia Transfer of Care Note  Patient: Elizabeth [name]  Procedure(s) Performed: ESOPHAGOGASTRODUODENOSCOPY (EGD) WITH PROPOFOL (N/A ) BIOPSY  Patient Location: PACU  Anesthesia Type:MAC  Level of Consciousness: sedated, patient cooperative and responds to stimulation  Airway & Oxygen Therapy: Patient Spontanous Breathing and Patient connected to face mask oxygen  Post-op Assessment: Report given to RN and Post -op Vital signs reviewed and stable  Post vital signs: Reviewed and stable  Last Vitals:  Vitals Value Taken Time  BP    Temp    Pulse    Resp    SpO2      Last Pain:  Vitals:   01/18/21 0817  TempSrc: Oral  PainSc: 0-No pain         Complications: No complications documented.

## 2021-01-18 NOTE — Anesthesia Preprocedure Evaluation (Signed)
Anesthesia Evaluation  Patient identified by MRN, date of birth, ID band Patient awake    Reviewed: Allergy & Precautions, NPO status , Patient's Chart, lab work & pertinent test results  Airway Mallampati: III  TM Distance: <3 FB Neck ROM: Full    Dental no notable dental hx.    Pulmonary sleep apnea , Current Smoker,  Probable OSA   Pulmonary exam normal breath sounds clear to auscultation       Cardiovascular hypertension, Pt. on medications Normal cardiovascular exam Rhythm:Regular Rate:Normal     Neuro/Psych negative neurological ROS  negative psych ROS   GI/Hepatic Neg liver ROS, PUD, GERD  Medicated,  Endo/Other  diabetesMorbid obesity  Renal/GU negative Renal ROS  negative genitourinary   Musculoskeletal negative musculoskeletal ROS (+)   Abdominal (+) + obese,   Peds negative pediatric ROS (+)  Hematology negative hematology ROS (+)   Anesthesia Other Findings   Reproductive/Obstetrics negative OB ROS                             Anesthesia Physical Anesthesia Plan  ASA: III  Anesthesia Plan: MAC   Post-op Pain Management:    Induction: Intravenous  PONV Risk Score and Plan: 2 and Propofol infusion and Treatment may vary due to age or medical condition  Airway Management Planned: Simple Face Mask  Additional Equipment:   Intra-op Plan:   Post-operative Plan:   Informed Consent: I have reviewed the patients History and Physical, chart, labs and discussed the procedure including the risks, benefits and alternatives for the proposed anesthesia with the patient or authorized representative who has indicated his/her understanding and acceptance.     Dental advisory given  Plan Discussed with: CRNA and Surgeon  Anesthesia Plan Comments:         Anesthesia Quick Evaluation

## 2021-01-18 NOTE — Discharge Instructions (Signed)
YOU HAD AN ENDOSCOPIC PROCEDURE TODAY AT THE [facility]:   Refer to the procedure report that was given to you for any specific questions about what was found during the examination.  If the procedure report does not answer your questions, please call your gastroenterologist to clarify.  If you requested that your care partner not be given the details of your procedure findings, then the procedure report has been included in a sealed envelope for you to review at your convenience later.  YOU SHOULD EXPECT: Some feelings of bloating in the abdomen. Passage of more gas than usual.  Walking can help get rid of the air that was put into your GI tract during the procedure and reduce the bloating. If you had a lower endoscopy (such as a colonoscopy or flexible sigmoidoscopy) you may notice spotting of blood in your stool or on the toilet paper. If you underwent a bowel prep for your procedure, you may not have a normal bowel movement for a few days.  Please Note:  You might notice some irritation and congestion in your nose or some drainage.  This is from the oxygen used during your procedure.  There is no need for concern and it should clear up in a day or so.  SYMPTOMS TO REPORT IMMEDIATELY:    Following upper endoscopy (EGD)  Vomiting of blood or coffee ground material  New chest pain or pain under the shoulder blades  Painful or persistently difficult swallowing  New shortness of breath  Fever of 100F or higher  Black, tarry-looking stools  For urgent or emergent issues, a gastroenterologist can be reached at any hour by calling [phone]. Do not use MyChart messaging for urgent concerns.    DIET:  We do recommend a small meal at first, but then you may proceed to your regular diet.  Drink plenty of fluids but you should avoid alcoholic beverages for 24 hours.  ACTIVITY:  You should plan to take it easy for the rest of today and you should NOT DRIVE or use heavy machinery  until tomorrow (because of the sedation medicines used during the test).    FOLLOW UP: Our staff will call the number listed on your records 48-72 hours following your procedure to check on you and address any questions or concerns that you may have regarding the information given to you following your procedure. If we do not reach you, we will leave a message.  We will attempt to reach you two times.  During this call, we will ask if you have developed any symptoms of COVID 19. If you develop any symptoms (ie: fever, flu-like symptoms, shortness of breath, cough etc.) before then, please call [phone].  If you test positive for Covid 19 in the 2 weeks post procedure, please call and report this information to us.    If any biopsies were taken you will be contacted by phone or by letter within the next 1-3 weeks.  Please call us at [phone] if you have not heard about the biopsies in 3 weeks.    SIGNATURES/CONFIDENTIALITY: You and/or your care partner have signed paperwork which will be entered into your electronic medical record.  These signatures attest to the fact that that the information above on your After Visit Summary has been reviewed and is understood.  Full responsibility of the confidentiality of this discharge information lies with you and/or your care-partner. 

## 2021-01-19 ENCOUNTER — Encounter (HOSPITAL_COMMUNITY): Payer: Self-pay | Admitting: Gastroenterology

## 2021-01-20 ENCOUNTER — Other Ambulatory Visit (HOSPITAL_BASED_OUTPATIENT_CLINIC_OR_DEPARTMENT_OTHER): Payer: Self-pay

## 2021-01-22 ENCOUNTER — Encounter: Payer: Self-pay | Admitting: Family

## 2021-01-22 DIAGNOSIS — K219 Gastro-esophageal reflux disease without esophagitis: Secondary | ICD-10-CM

## 2021-01-22 DIAGNOSIS — R1013 Epigastric pain: Secondary | ICD-10-CM

## 2021-01-22 DIAGNOSIS — R1011 Right upper quadrant pain: Secondary | ICD-10-CM

## 2021-01-22 DIAGNOSIS — R110 Nausea: Secondary | ICD-10-CM

## 2021-01-22 LAB — SURGICAL PATHOLOGY

## 2021-01-29 ENCOUNTER — Ambulatory Visit: Payer: No Typology Code available for payment source | Admitting: Gastroenterology

## 2021-01-30 ENCOUNTER — Ambulatory Visit (HOSPITAL_COMMUNITY)
Admission: RE | Admit: 2021-01-30 | Discharge: 2021-01-30 | Disposition: A | Discharge status: Home / Self Care | Payer: No Typology Code available for payment source | Source: Ambulatory Visit | Attending: Gastroenterology | Admitting: Gastroenterology

## 2021-01-30 ENCOUNTER — Encounter (HOSPITAL_COMMUNITY): Payer: Self-pay

## 2021-01-30 ENCOUNTER — Other Ambulatory Visit (HOSPITAL_BASED_OUTPATIENT_CLINIC_OR_DEPARTMENT_OTHER): Payer: Self-pay

## 2021-01-30 ENCOUNTER — Other Ambulatory Visit: Payer: Self-pay

## 2021-01-30 ENCOUNTER — Other Ambulatory Visit: Payer: Self-pay | Admitting: Family

## 2021-01-30 DIAGNOSIS — R1011 Right upper quadrant pain: Secondary | ICD-10-CM | POA: Diagnosis present

## 2021-01-30 DIAGNOSIS — K219 Gastro-esophageal reflux disease without esophagitis: Secondary | ICD-10-CM | POA: Insufficient documentation

## 2021-01-30 DIAGNOSIS — R110 Nausea: Secondary | ICD-10-CM | POA: Insufficient documentation

## 2021-01-30 DIAGNOSIS — R1013 Epigastric pain: Secondary | ICD-10-CM | POA: Insufficient documentation

## 2021-01-30 LAB — POCT I-STAT CREATININE: Creatinine, Ser: 0.6 mg/dL (ref 0.44–1.00)

## 2021-01-30 MED ORDER — IOHEXOL 300 MG/ML  SOLN
100.0000 mL | Freq: Once | INTRAMUSCULAR | Status: AC | PRN
  Administered 2021-01-30: 100 mL via INTRAVENOUS

## 2021-01-30 MED FILL — Hydroxyzine HCl Tab 25 MG: ORAL | 30 days supply | Qty: 30 | Fill #0 | Status: AC

## 2021-01-30 MED FILL — Dulaglutide Soln Auto-injector 1.5 MG/0.5ML: SUBCUTANEOUS | 28 days supply | Qty: 2 | Fill #0 | Status: AC

## 2021-01-30 MED FILL — Ondansetron Orally Disintegrating Tab 4 MG: ORAL | 10 days supply | Qty: 30 | Fill #0 | Status: AC

## 2021-01-31 ENCOUNTER — Other Ambulatory Visit (HOSPITAL_BASED_OUTPATIENT_CLINIC_OR_DEPARTMENT_OTHER): Payer: Self-pay

## 2021-01-31 ENCOUNTER — Telehealth: Payer: Self-pay | Admitting: Family

## 2021-01-31 DIAGNOSIS — I10 Essential (primary) hypertension: Secondary | ICD-10-CM | POA: Diagnosis not present

## 2021-01-31 DIAGNOSIS — Z6841 Body Mass Index (BMI) 40.0 and over, adult: Secondary | ICD-10-CM | POA: Diagnosis not present

## 2021-01-31 DIAGNOSIS — E119 Type 2 diabetes mellitus without complications: Secondary | ICD-10-CM | POA: Diagnosis not present

## 2021-01-31 DIAGNOSIS — E559 Vitamin D deficiency, unspecified: Secondary | ICD-10-CM | POA: Diagnosis not present

## 2021-01-31 DIAGNOSIS — Z634 Disappearance and death of family member: Secondary | ICD-10-CM | POA: Diagnosis not present

## 2021-01-31 MED ORDER — TRAMADOL HCL 50 MG PO TABS
ORAL_TABLET | Freq: Four times a day (QID) | ORAL | 0 refills | Status: DC | PRN
  Filled 2021-01-31: qty 20, 5d supply, fill #0

## 2021-01-31 NOTE — Telephone Encounter (Signed)
See mychart.  

## 2021-02-02 ENCOUNTER — Other Ambulatory Visit (HOSPITAL_BASED_OUTPATIENT_CLINIC_OR_DEPARTMENT_OTHER): Payer: Self-pay

## 2021-02-07 ENCOUNTER — Other Ambulatory Visit (HOSPITAL_BASED_OUTPATIENT_CLINIC_OR_DEPARTMENT_OTHER): Payer: Self-pay

## 2021-02-07 ENCOUNTER — Encounter: Payer: Self-pay | Admitting: Family

## 2021-02-07 MED FILL — Pantoprazole Sodium EC Tab 40 MG (Base Equiv): ORAL | 15 days supply | Qty: 30 | Fill #0 | Status: AC

## 2021-02-07 NOTE — Telephone Encounter (Signed)
Spoke to patient.  She is feeling better mentally now, but is just tired of hurting.  I told her that I would request a copy of the surgical consult to review and would also touch base with her GI doctor.  Pt verbalizes understanding.  Rod Holler, could you please call [facility] and request consult?

## 2021-02-07 NOTE — Telephone Encounter (Signed)
Patient advised, per provider, she will receive a call from provider later today, in the mean time if she has any active suicidal thoughts with plan she needs to go to the ER.  She reports he is not having active suicidal thoughts at this time. She verbalized understanding instructions.

## 2021-02-09 ENCOUNTER — Ambulatory Visit: Payer: No Typology Code available for payment source | Admitting: Family

## 2021-02-09 NOTE — Progress Notes (Incomplete)
Subjective:   By signing my name below, I, Shehryar Baig, attest that this documentation has been prepared under the direction and in the presence of [name], NP. 02/09/2021    Patient ID: Elizabeth [name], female    DOB: Jul 03, 1976, 45 y.o.   MRN: [id number]  No chief complaint on file.   HPI Patient is in today for a office visit.  Depression-  Hypertension-  Hyperlipidemia-  Diabetes Mellitus-    Past Medical History:  Diagnosis Date  . Anemia   . Arthritis of left knee   . Astigmatism   . Depression   . Diabetes mellitus without complication ([place])   . Dry mouth   . Fatigue   . GERD (gastroesophageal reflux disease)   . Heartburn   . High cholesterol   . Hypertension   . Keratoconjunctivitis   . Muscle pain   . Vitamin D deficiency   . Weakness     Past Surgical History:  Procedure Laterality Date  . BIOPSY  01/18/2021   Procedure: BIOPSY;  Surgeon: Lavena Bullion, DO;  Location: [facility];  Service: Gastroenterology;;  . DILATION AND CURETTAGE OF UTERUS    . ENDOMETRIAL BIOPSY  12/19/2017   normal per pt  . ESOPHAGOGASTRODUODENOSCOPY (EGD) WITH PROPOFOL N/A 01/18/2021   Procedure: ESOPHAGOGASTRODUODENOSCOPY (EGD) WITH PROPOFOL;  Surgeon: Lavena Bullion, DO;  Location: [facility];  Service: Gastroenterology;  Laterality: N/A;  . TONSILLECTOMY    . TUBAL LIGATION      Family History  Problem Relation Age of Onset  . Diabetes Mother   . Hypertension Mother   . Hyperlipidemia Mother   . Obesity Mother   . Ovarian cancer Maternal Grandmother   . HIV Father        died from complications (IVDU)  . Alcohol abuse Father   . Drug abuse Father   . Colon cancer Neg Hx   . Esophageal cancer Neg Hx     Social History   Socioeconomic History  . Marital status: Married    Spouse name: [name]  . Number of children: 2  . Years of education: Not on file  . Highest education level: Not on file  Occupational History  . Occupation:  CMA - Economist: [employer]  Tobacco Use  . Smoking status: Current Every Day Smoker    Packs/day: 0.50    Years: 3.00    Pack years: 1.50    Types: Cigarettes  . Smokeless tobacco: Never Used  . Tobacco comment: 4-5/day  Vaping Use  . Vaping Use: Never used  Substance and Sexual Activity  . Alcohol use: No  . Drug use: No  . Sexual activity: Yes    Partners: Male    Birth control/protection: Surgical, Implant  Other Topics Concern  . Not on file  Social History Narrative    2 children   84- son Allen Kell   2000- son Insurance claims handler in [place] in [place]   Enjoys reading   Widowed 07-29-2023, husband died from COVID-49.   Social Determinants of Health   Financial Resource Strain: Not on file  Food Insecurity: Not on file  Transportation Needs: Not on file  Physical Activity: Not on file  Stress: Not on file  Social Connections: Not on file  Intimate Partner Violence: Not on file    Outpatient Medications Prior to Visit  Medication Sig Dispense Refill  . amLODipine (NORVASC) 10 MG tablet Take 1 tablet (10 mg  total) by mouth daily. 90 tablet 1  . COVID-19 mRNA vaccine, Pfizer, 30 MCG/0.3ML injection AS DIRECTED .3 mL 0  . Dapagliflozin-metFORMIN HCl ER 02-999 MG TB24 Take 2 tablets by mouth every morning.    . Dapagliflozin-metFORMIN HCl ER 02-999 MG TB24 TAKE 2 TABLETS BY MOUTH DAILY BEFORE BREAKFAST. 180 tablet 1  . Dulaglutide 1.5 MG/0.5ML SOPN Inject 1.5 mg into the skin every Sunday.    . escitalopram (LEXAPRO) 20 MG tablet TAKE 1 TABLET BY MOUTH ONCE DAILY 30 tablet 1  . etonogestrel (NEXPLANON) 68 MG IMPL implant 1 each by Subdermal route once.    . hydrOXYzine (ATARAX/VISTARIL) 25 MG tablet TAKE 1 TABLET BY MOUTH EVERY NIGHT AT BEDTIME AS NEEDED FOR ANXIETY AND INSOMNIA 30 tablet 3  . lisinopril (ZESTRIL) 40 MG tablet TAKE 1 TABLET BY MOUTH ONCE DAILY 90 tablet 0  . lovastatin (MEVACOR) 40 MG tablet TAKE 1 TABLET (40 MG  TOTAL) BY MOUTH AT BEDTIME. 90 tablet 1  . ondansetron (ZOFRAN-ODT) 4 MG disintegrating tablet DISSOLVE 1 TABLET (4 MG TOTAL) BY MOUTH EVERY 8 (EIGHT) HOURS AS NEEDED. (Patient taking differently: Take 4 mg by mouth every 8 (eight) hours as needed for nausea or vomiting.) 30 tablet 3  . pantoprazole (PROTONIX) 40 MG tablet TAKE 1 TABLET (40 MG TOTAL) BY MOUTH 2 (TWO) TIMES DAILY FOR 30 DAYS, THEN 1 TABLET (40 MG TOTAL) DAILY. 30 tablet 3  . traMADol (ULTRAM) 50 MG tablet TAKE 1 TABLET (50 MG TOTAL) BY MOUTH EVERY 8 (EIGHT) HOURS AS NEEDED FOR UP TO 5 DAYS. 15 tablet 0  . traMADol (ULTRAM) 50 MG tablet TAKE 1 TABLET BY MOUTH EVERY 6 HOURS AS NEEDED 20 tablet 0  . TRULICITY 1.5 ZE/0.9QZ SOPN TAKE 1.5MG  INTO THE SKIN EVERY 7 DAYS 2 mL 5  . TRULICITY 1.5 RA/0.7MA SOPN INJECT 1.5 MG UNDER THE SKIN ONCE EVERY 7 DAYS 2 mL 5  . TRULICITY 1.5 UQ/3.3HL SOPN INJECT 1.5 MG INTO THE SKIN EVERY 7 DAYS. 2 mL 3   No facility-administered medications prior to visit.    No Known Allergies  ROS     Objective:    Physical Exam  There were no vitals taken for this visit. Wt Readings from Last 3 Encounters:  01/18/21 289 lb 14.5 oz (131.5 kg)  01/09/21 290 lb 4 oz (131.7 kg)  12/27/20 292 lb (132.5 kg)    Diabetic Foot Exam - Simple   No data filed    Lab Results  Component Value Date   WBC 8.1 12/27/2020   HGB 12.4 12/27/2020   HCT 39.2 12/27/2020   PLT 245.0 12/27/2020   GLUCOSE 141 (H) 12/20/2020   CHOL 147 07/14/2020   TRIG 68 07/14/2020   HDL 48 (L) 07/14/2020   LDLCALC 85 07/14/2020   ALT 17 12/20/2020   AST 16 12/20/2020   NA 137 12/20/2020   K 3.7 12/20/2020   CL 106 12/20/2020   CREATININE 0.60 01/30/2021   BUN 10 12/20/2020   CO2 22 12/20/2020   TSH 0.837 12/22/2018   INR 1.02 06/25/2011   HGBA1C 6.9 (H) 12/08/2020    Lab Results  Component Value Date   TSH 0.837 12/22/2018   Lab Results  Component Value Date   WBC 8.1 12/27/2020   HGB 12.4 12/27/2020   HCT 39.2  12/27/2020   MCV 75.0 (L) 12/27/2020   PLT 245.0 12/27/2020   Lab Results  Component Value Date   NA 137 12/20/2020   K  3.7 12/20/2020   CO2 22 12/20/2020   GLUCOSE 141 (H) 12/20/2020   BUN 10 12/20/2020   CREATININE 0.60 01/30/2021   BILITOT <0.1 (L) 12/20/2020   ALKPHOS 85 12/20/2020   AST 16 12/20/2020   ALT 17 12/20/2020   PROT 6.7 12/20/2020   ALBUMIN 3.3 (L) 12/20/2020   CALCIUM 8.4 (L) 12/20/2020   ANIONGAP 9 12/20/2020   GFR 110.20 12/13/2020   Lab Results  Component Value Date   CHOL 147 07/14/2020   Lab Results  Component Value Date   HDL 48 (L) 07/14/2020   Lab Results  Component Value Date   LDLCALC 85 07/14/2020   Lab Results  Component Value Date   TRIG 68 07/14/2020   Lab Results  Component Value Date   CHOLHDL 3.1 07/14/2020   Lab Results  Component Value Date   HGBA1C 6.9 (H) 12/08/2020       Assessment & Plan:   Problem List Items Addressed This Visit   None      No orders of the defined types were placed in this encounter.   I, Shehryar [place], personally preformed the services described in this documentation.  All medical record entries made by the scribe were at my direction and in my presence.  I have reviewed the chart and discharge instructions (if applicable) and agree that the record reflects my personal performance and is accurate and complete. 02/09/2021   I,Shehryar Baig,acting as a scribe for [name], NP.,have documented all relevant documentation on the behalf of [name], NP,as directed by  [name], NP while in the presence of [name], NP.   Shehryar [employer]

## 2021-02-11 MED ORDER — DICYCLOMINE HCL 20 MG PO TABS
ORAL_TABLET | ORAL | 0 refills | Status: DC
  Filled 2021-02-11: qty 90, 30d supply, fill #0

## 2021-02-11 NOTE — Telephone Encounter (Signed)
Please advise pt that I reviewed her symptoms with Dr. Bryan Lemma. He would like for her to try Bentyl to see if that improves her pain.  I am sending rx to her pharmacy.

## 2021-02-12 ENCOUNTER — Other Ambulatory Visit (HOSPITAL_BASED_OUTPATIENT_CLINIC_OR_DEPARTMENT_OTHER): Payer: Self-pay

## 2021-02-13 ENCOUNTER — Ambulatory Visit (INDEPENDENT_AMBULATORY_CARE_PROVIDER_SITE_OTHER): Payer: No Typology Code available for payment source | Admitting: Family

## 2021-02-13 ENCOUNTER — Other Ambulatory Visit: Payer: Self-pay

## 2021-02-13 VITALS — BP 124/72 | HR 103 | Temp 99.1°F | Resp 18 | Ht 62.0 in

## 2021-02-13 DIAGNOSIS — R1011 Right upper quadrant pain: Secondary | ICD-10-CM

## 2021-02-13 NOTE — Patient Instructions (Signed)
Please begin Bentyl.

## 2021-02-13 NOTE — Progress Notes (Signed)
Subjective:    Patient ID: Elizabeth [name], female    DOB: December 01, 1975, 45 y.o.   MRN: [id number]  HPI  Patient is a 45 yr old female who presents today for follow up of her RUQ pain.  She is accompanied by her mom today.  She continues to have severe intermittent right upper quadrant pain which is worse when she lays on her stomach or with eating.  She has had nausea and poor appetite and as a result she has mainly been having Ensure rather than whole foods.  She has had an abdominal ultrasound, HIDA scan, and abdominal CT.  There were no obvious gallbladder abnormalities on any of these studies.  She has also seen gastroenterology and has undergone an endoscopy which noted a hiatal hernia.  She had a surgical consultation and the surgeon preferred to have her follow back up with GI and PCP at this time because there were no obvious gallbladder changes on imaging.  The patient is struggling to go to work and to sleep due to the pain.  She reports that it is also contributing to her depression symptoms.  She continues to have abdominal pain.  She reports that her bowel habits alternate between constipation and diarrhea.  Review of Systems See HPI  Past Medical History:  Diagnosis Date  . Anemia   . Arthritis of left knee   . Astigmatism   . Depression   . Diabetes mellitus without complication ([place])   . Dry mouth   . Fatigue   . GERD (gastroesophageal reflux disease)   . Heartburn   . High cholesterol   . Hypertension   . Keratoconjunctivitis   . Muscle pain   . Vitamin D deficiency   . Weakness      Social History   Socioeconomic History  . Marital status: Married    Spouse name: [name]  . Number of children: 2  . Years of education: Not on file  . Highest education level: Not on file  Occupational History  . Occupation: CMA - Economist: [employer]  Tobacco Use  . Smoking status: Current Every Day Smoker    Packs/day: 0.50    Years: 3.00     Pack years: 1.50    Types: Cigarettes  . Smokeless tobacco: Never Used  . Tobacco comment: 4-5/day  Vaping Use  . Vaping Use: Never used  Substance and Sexual Activity  . Alcohol use: No  . Drug use: No  . Sexual activity: Yes    Partners: Male    Birth control/protection: Surgical, Implant  Other Topics Concern  . Not on file  Social History Narrative    2 children   75- son Allen Kell   2000- son Insurance claims handler in [place] in [place]   Enjoys reading   Widowed 07-25-2023, husband died from COVID-9.   Social Determinants of Health   Financial Resource Strain: Not on file  Food Insecurity: Not on file  Transportation Needs: Not on file  Physical Activity: Not on file  Stress: Not on file  Social Connections: Not on file  Intimate Partner Violence: Not on file    Past Surgical History:  Procedure Laterality Date  . BIOPSY  01/18/2021   Procedure: BIOPSY;  Surgeon: Lavena Bullion, DO;  Location: [facility];  Service: Gastroenterology;;  . DILATION AND CURETTAGE OF UTERUS    . ENDOMETRIAL BIOPSY  12/19/2017   normal per pt  . ESOPHAGOGASTRODUODENOSCOPY (EGD)  WITH PROPOFOL N/A 01/18/2021   Procedure: ESOPHAGOGASTRODUODENOSCOPY (EGD) WITH PROPOFOL;  Surgeon: Lavena Bullion, DO;  Location: [facility];  Service: Gastroenterology;  Laterality: N/A;  . TONSILLECTOMY    . TUBAL LIGATION      Family History  Problem Relation Age of Onset  . Diabetes Mother   . Hypertension Mother   . Hyperlipidemia Mother   . Obesity Mother   . Ovarian cancer Maternal Grandmother   . HIV Father        died from complications (IVDU)  . Alcohol abuse Father   . Drug abuse Father   . Colon cancer Neg Hx   . Esophageal cancer Neg Hx     No Known Allergies  Current Outpatient Medications on File Prior to Visit  Medication Sig Dispense Refill  . amLODipine (NORVASC) 10 MG tablet Take 1 tablet (10 mg total) by mouth daily. 90 tablet 1  . COVID-19 mRNA vaccine,  Pfizer, 30 MCG/0.3ML injection AS DIRECTED .3 mL 0  . Dapagliflozin-metFORMIN HCl ER 02-999 MG TB24 Take 2 tablets by mouth every morning.    . Dapagliflozin-metFORMIN HCl ER 02-999 MG TB24 TAKE 2 TABLETS BY MOUTH DAILY BEFORE BREAKFAST. 180 tablet 1  . dicyclomine (BENTYL) 20 MG tablet Take 1 tablet by mouth every 8 hours for 2 weeks, then switch to every 8 hrs as needed for abdominal discomfort. (Patient taking differently: Take 1 tablet by mouth every 8 hours for 2 weeks, then switch to every 8 hrs as needed for abdominal discomfort.) 90 tablet 0  . Dulaglutide 1.5 MG/0.5ML SOPN Inject 1.5 mg into the skin every Sunday.    . escitalopram (LEXAPRO) 20 MG tablet TAKE 1 TABLET BY MOUTH ONCE DAILY 30 tablet 1  . etonogestrel (NEXPLANON) 68 MG IMPL implant 1 each by Subdermal route once.    . hydrOXYzine (ATARAX/VISTARIL) 25 MG tablet TAKE 1 TABLET BY MOUTH EVERY NIGHT AT BEDTIME AS NEEDED FOR ANXIETY AND INSOMNIA 30 tablet 3  . lisinopril (ZESTRIL) 40 MG tablet TAKE 1 TABLET BY MOUTH ONCE DAILY 90 tablet 0  . lovastatin (MEVACOR) 40 MG tablet TAKE 1 TABLET (40 MG TOTAL) BY MOUTH AT BEDTIME. 90 tablet 1  . ondansetron (ZOFRAN-ODT) 4 MG disintegrating tablet DISSOLVE 1 TABLET (4 MG TOTAL) BY MOUTH EVERY 8 (EIGHT) HOURS AS NEEDED. (Patient taking differently: Take 4 mg by mouth every 8 (eight) hours as needed for nausea or vomiting.) 30 tablet 3  . pantoprazole (PROTONIX) 40 MG tablet TAKE 1 TABLET (40 MG TOTAL) BY MOUTH 2 (TWO) TIMES DAILY FOR 30 DAYS, THEN 1 TABLET (40 MG TOTAL) DAILY. 30 tablet 3  . traMADol (ULTRAM) 50 MG tablet TAKE 1 TABLET (50 MG TOTAL) BY MOUTH EVERY 8 (EIGHT) HOURS AS NEEDED FOR UP TO 5 DAYS. (Patient taking differently: Take by mouth every 8 (eight) hours as needed. for up to 5 days) 15 tablet 0  . traMADol (ULTRAM) 50 MG tablet TAKE 1 TABLET BY MOUTH EVERY 6 HOURS AS NEEDED 20 tablet 0  . TRULICITY 1.5 ZO/1.0RU SOPN TAKE 1.5MG  INTO THE SKIN EVERY 7 DAYS 2 mL 5  . TRULICITY 1.5  EA/5.4UJ SOPN INJECT 1.5 MG UNDER THE SKIN ONCE EVERY 7 DAYS 2 mL 5  . TRULICITY 1.5 WJ/1.9JY SOPN INJECT 1.5 MG INTO THE SKIN EVERY 7 DAYS. 2 mL 3  . [DISCONTINUED] famotidine (PEPCID) 20 MG tablet Take 1 tablet (20 mg total) by mouth daily. (Patient not taking: Reported on 01/09/2021) 30 tablet 3  . [DISCONTINUED]  sucralfate (CARAFATE) 1 g tablet Take 1 tablet (1 g total) by mouth 4 (four) times daily -  with meals and at bedtime. 60 tablet 0  . [DISCONTINUED] traZODone (DESYREL) 50 MG tablet 1/2 to 1 tab by mouth at bedtime as needed for sleep 90 tablet 0   No current facility-administered medications on file prior to visit.    BP 124/72 (BP Location: Left Arm, Patient Position: Sitting, Cuff Size: Large)   Pulse (!) 103   Temp 99.1 F (37.3 C) (Oral)   Resp 18   Ht 5\' 2"  (1.575 m)   SpO2 100%   BMI 53.02 kg/m       Objective:   Physical Exam Constitutional:      Appearance: She is well-developed. She is obese.     Comments: Patient appears uncomfortable  Cardiovascular:     Rate and Rhythm: Normal rate and regular rhythm.     Heart sounds: Normal heart sounds. No murmur heard.   Pulmonary:     Effort: Pulmonary effort is normal. No respiratory distress.     Breath sounds: Normal breath sounds. No wheezing.  Abdominal:     General: Bowel sounds are decreased.     Palpations: Abdomen is soft.     Tenderness: There is abdominal tenderness in the right upper quadrant.  Psychiatric:        Behavior: Behavior normal.        Thought Content: Thought content normal.        Judgment: Judgment normal.           Assessment & Plan:  Right upper quadrant pain-will obtain complete metabolic panel, lipase, and CBC.  She has not yet started the Bentyl Rx that I gave her.  She plans to start this.  She is also requesting a second opinion from another Psychologist, sport and exercise.  I have placed the referral.  I suggested that she switch from Ensure to Glucerna due to her diabetes.  This visit  occurred during the SARS-CoV-2 public health emergency.  Safety protocols were in place, including screening questions prior to the visit, additional usage of staff PPE, and extensive cleaning of exam room while observing appropriate contact time as indicated for disinfecting solutions.

## 2021-02-14 LAB — HEPATIC FUNCTION PANEL
ALT: 18 U/L (ref 0–35)
AST: 13 U/L (ref 0–37)
Albumin: 4 g/dL (ref 3.5–5.2)
Alkaline Phosphatase: 93 U/L (ref 39–117)
Bilirubin, Direct: 0.1 mg/dL (ref 0.0–0.3)
Total Bilirubin: 0.4 mg/dL (ref 0.2–1.2)
Total Protein: 6.9 g/dL (ref 6.0–8.3)

## 2021-02-14 LAB — CBC WITH DIFFERENTIAL/PLATELET
Basophils Absolute: 0.1 10*3/uL (ref 0.0–0.1)
Basophils Relative: 0.7 % (ref 0.0–3.0)
Eosinophils Absolute: 0.1 10*3/uL (ref 0.0–0.7)
Eosinophils Relative: 0.9 % (ref 0.0–5.0)
HCT: 42.3 % (ref 36.0–46.0)
Hemoglobin: 13.3 g/dL (ref 12.0–15.0)
Lymphocytes Relative: 23.1 % (ref 12.0–46.0)
Lymphs Abs: 1.8 10*3/uL (ref 0.7–4.0)
MCHC: 31.5 g/dL (ref 30.0–36.0)
MCV: 74.4 fl — ABNORMAL LOW (ref 78.0–100.0)
Monocytes Absolute: 0.4 10*3/uL (ref 0.1–1.0)
Monocytes Relative: 4.7 % (ref 3.0–12.0)
Neutro Abs: 5.5 10*3/uL (ref 1.4–7.7)
Neutrophils Relative %: 70.6 % (ref 43.0–77.0)
Platelets: 303 10*3/uL (ref 150.0–400.0)
RBC: 5.69 Mil/uL — ABNORMAL HIGH (ref 3.87–5.11)
RDW: 15.2 % (ref 11.5–15.5)
WBC: 7.9 10*3/uL (ref 4.0–10.5)

## 2021-02-14 LAB — LIPASE: Lipase: 40 U/L (ref 11.0–59.0)

## 2021-02-20 NOTE — Telephone Encounter (Signed)
Patient taking medication and seeing some improvement

## 2021-02-21 ENCOUNTER — Encounter: Payer: Self-pay | Admitting: Family

## 2021-02-28 ENCOUNTER — Other Ambulatory Visit (HOSPITAL_BASED_OUTPATIENT_CLINIC_OR_DEPARTMENT_OTHER): Payer: Self-pay

## 2021-02-28 ENCOUNTER — Other Ambulatory Visit: Payer: Self-pay

## 2021-02-28 MED FILL — Dulaglutide Soln Auto-injector 1.5 MG/0.5ML: SUBCUTANEOUS | 28 days supply | Qty: 2 | Fill #1 | Status: AC

## 2021-02-28 MED FILL — Hydroxyzine HCl Tab 25 MG: ORAL | 30 days supply | Qty: 30 | Fill #1 | Status: AC

## 2021-02-28 MED FILL — Pantoprazole Sodium EC Tab 40 MG (Base Equiv): ORAL | 15 days supply | Qty: 30 | Fill #1 | Status: CN

## 2021-03-01 ENCOUNTER — Other Ambulatory Visit (HOSPITAL_BASED_OUTPATIENT_CLINIC_OR_DEPARTMENT_OTHER): Payer: Self-pay

## 2021-03-01 MED ORDER — PANTOPRAZOLE SODIUM 40 MG PO TBEC
40.0000 mg | DELAYED_RELEASE_TABLET | Freq: Two times a day (BID) | ORAL | 5 refills | Status: DC
Start: 2021-03-01 — End: 2022-03-04
  Filled 2021-03-01 – 2021-03-02 (×3): qty 180, 90d supply, fill #0
  Filled 2021-06-01: qty 180, 90d supply, fill #1
  Filled 2021-09-05: qty 180, 90d supply, fill #2
  Filled 2021-12-05: qty 180, 90d supply, fill #3

## 2021-03-01 NOTE — Telephone Encounter (Signed)
OK to give Rx to allow for BID dosing of Pantoprazole at current dose. Please give 90 day supply and RF 5. Thanks   Updated prescription sent to [facility] outpatient pharmacy.

## 2021-03-02 ENCOUNTER — Other Ambulatory Visit: Payer: Self-pay

## 2021-03-02 ENCOUNTER — Other Ambulatory Visit (HOSPITAL_BASED_OUTPATIENT_CLINIC_OR_DEPARTMENT_OTHER): Payer: Self-pay

## 2021-03-05 ENCOUNTER — Other Ambulatory Visit (HOSPITAL_BASED_OUTPATIENT_CLINIC_OR_DEPARTMENT_OTHER): Payer: Self-pay

## 2021-03-09 ENCOUNTER — Other Ambulatory Visit (HOSPITAL_BASED_OUTPATIENT_CLINIC_OR_DEPARTMENT_OTHER): Payer: Self-pay

## 2021-03-09 MED ORDER — DAPAGLIFLOZIN PRO-METFORMIN ER 5-1000 MG PO TB24
ORAL_TABLET | ORAL | 1 refills | Status: AC
  Filled 2021-03-09 (×3): qty 180, 90d supply, fill #0
  Filled 2021-06-17: qty 180, 90d supply, fill #1

## 2021-03-11 ENCOUNTER — Other Ambulatory Visit: Payer: Self-pay | Admitting: Family

## 2021-03-12 ENCOUNTER — Other Ambulatory Visit (HOSPITAL_BASED_OUTPATIENT_CLINIC_OR_DEPARTMENT_OTHER): Payer: Self-pay

## 2021-03-12 ENCOUNTER — Other Ambulatory Visit: Payer: Self-pay | Admitting: Family

## 2021-03-12 MED ORDER — DICYCLOMINE HCL 20 MG PO TABS
40.0000 mg | ORAL_TABLET | Freq: Two times a day (BID) | ORAL | 2 refills | Status: DC | PRN
  Filled 2021-03-12: qty 120, 30d supply, fill #0
  Filled 2021-04-23: qty 120, 30d supply, fill #1
  Filled 2021-06-17: qty 120, 30d supply, fill #2

## 2021-03-12 MED FILL — Lisinopril Tab 40 MG: ORAL | 90 days supply | Qty: 90 | Fill #0 | Status: AC

## 2021-03-13 ENCOUNTER — Other Ambulatory Visit (HOSPITAL_BASED_OUTPATIENT_CLINIC_OR_DEPARTMENT_OTHER): Payer: Self-pay

## 2021-03-14 ENCOUNTER — Other Ambulatory Visit: Payer: Self-pay

## 2021-03-14 ENCOUNTER — Ambulatory Visit (INDEPENDENT_AMBULATORY_CARE_PROVIDER_SITE_OTHER): Payer: No Typology Code available for payment source | Admitting: Gastroenterology

## 2021-03-14 ENCOUNTER — Encounter: Payer: Self-pay | Admitting: Gastroenterology

## 2021-03-14 ENCOUNTER — Ambulatory Visit (HOSPITAL_BASED_OUTPATIENT_CLINIC_OR_DEPARTMENT_OTHER)
Admission: RE | Admit: 2021-03-14 | Discharge: 2021-03-14 | Disposition: A | Discharge status: Home / Self Care | Payer: No Typology Code available for payment source | Source: Ambulatory Visit | Attending: Gastroenterology | Admitting: Gastroenterology

## 2021-03-14 ENCOUNTER — Other Ambulatory Visit (HOSPITAL_BASED_OUTPATIENT_CLINIC_OR_DEPARTMENT_OTHER): Payer: Self-pay

## 2021-03-14 VITALS — BP 126/78 | HR 105 | Ht 62.0 in | Wt 294.0 lb

## 2021-03-14 DIAGNOSIS — R1011 Right upper quadrant pain: Secondary | ICD-10-CM | POA: Insufficient documentation

## 2021-03-14 DIAGNOSIS — K219 Gastro-esophageal reflux disease without esophagitis: Secondary | ICD-10-CM | POA: Diagnosis present

## 2021-03-14 DIAGNOSIS — M546 Pain in thoracic spine: Secondary | ICD-10-CM

## 2021-03-14 DIAGNOSIS — R110 Nausea: Secondary | ICD-10-CM

## 2021-03-14 DIAGNOSIS — Z6841 Body Mass Index (BMI) 40.0 and over, adult: Secondary | ICD-10-CM

## 2021-03-14 NOTE — Patient Instructions (Signed)
If you are age 43 or older, your body mass index should be between 23-30. Your Body mass index is 53.77 kg/m. If this is out of the aforementioned range listed, please consider follow up with your Primary Care Provider.  If you are age 39 or younger, your body mass index should be between 19-25. Your Body mass index is 53.77 kg/m. If this is out of the aformentioned range listed, please consider follow up with your Primary Care Provider.   Please go to the 1st floor radiology and have a chest xray done today before you leave.  You may take Motrin 400mg  twice daily for 2 weeks.  Return to the clinic in 3 months.  Due to recent changes in healthcare laws, you may see the results of your imaging and laboratory studies on MyChart before your provider has had a chance to review them.  We understand that in some cases there may be results that are confusing or concerning to you. Not all laboratory results come back in the same time frame and the provider may be waiting for multiple results in order to interpret others.  Please give [name] 48 hours in order for your provider to thoroughly review all the results before contacting the office for clarification of your results.   Thank you for choosing me and [facility].  Vito Cirigliano, D.O.

## 2021-03-14 NOTE — Progress Notes (Signed)
P  Chief Complaint:    Abdominal pain, GERD  GI History: 45 y.o. female with a history of diabetes, HTN, obesity (BMI 53), hyperlipidemia, GERD, depression initially seen in the GI clinic on 01/08/2019 for evaluation of reflux symptoms, nausea, RUQ/MEG pain.  Evaluation to date as follows:  -12/13/2020: Evaluation in Primary Care Clinic: worsening chronic reflux sxs (heartburn, regurgitation), along with new onset nausea, and MEG pain. No dysphagia. Mildly elevated lipase (69), otherwise unremarkable CBC and CMP.  Negative H. pylori antibody. Improvement with GI cocktail in office. Increased omeprazole to 40 mg/day and added Pepcid 20 mg/day with improvement in reflux sxs.   -12/20/2020: ER eval for ongoing MEG pain, nausea. Eval unremarkable, to include normalized lipase, normal CBC (chronic microcytosis without anemia), CMP unremarkable. Started carafate QID and Zofran.     -12/27/2020: Follow-up with PCM:  Pain was improving, and only mild improvement in nausea.  Repeat lipase was normal, with normal CBC. - 12/28/2020: Abdominal ultrasound: hepatic steatosis - 01/09/2021: GI clinic evaluation for nausea, reflux, RUQ/MEG pain along with separate c/o constipation with pellet-like stools.  Some improvement with MiraLAX.  Started after Rx for Percocet from ER on 12/20/2020 - 01/15/2021: HIDA scan: Normal -01/18/2021: EGD: 3 cm HH, mild reflux changes, otherwise unremarkable with normal gastric/duodenal biopsies - 01/31/2021: CT abdomen/pelvis: Hepatic steatosis, otherwise unremarkable.  Normal liver enzymes and lipase - 02/07/2021: Was seen at [facility] and did not recommend ccy - 02/13/2021: Follow-up with PCM for ongoing RUQ pain.  Prescribed Bentyl and sent for second opinion with general surgery per patient request. Has appt with Dr. Colin Rhein in June at [facility]   HPI:     Patient is a 45 y.o. female presenting to the Gastroenterology Clinic for follow-up.  Increased Protonix 40 mg BID earlier this month.  Today,  she states the high dose PPI has helped her reflux significantly. No change in RUQ pain.  Pain present most days and tends to be worse with eating greasy, fried foods.  Pain can also be independent of eating.  Review of systems:     No chest pain, no SOB, no fevers, no urinary sx   Past Medical History:  Diagnosis Date  . Anemia   . Arthritis of left knee   . Astigmatism   . Depression   . Diabetes mellitus without complication ([place])   . Dry mouth   . Fatigue   . GERD (gastroesophageal reflux disease)   . Heartburn   . High cholesterol   . Hypertension   . Keratoconjunctivitis   . Muscle pain   . Vitamin D deficiency   . Weakness     Patient's surgical history, family medical history, social history, medications and allergies were all reviewed in Epic    Current Outpatient Medications  Medication Sig Dispense Refill  . amLODipine (NORVASC) 10 MG tablet Take 1 tablet (10 mg total) by mouth daily. 90 tablet 1  . COVID-19 mRNA vaccine, Pfizer, 30 MCG/0.3ML injection AS DIRECTED .3 mL 0  . Dapagliflozin-metFORMIN HCl ER (XIGDUO XR) 02-999 MG TB24 TAKE 2 TABLETS BY MOUTH DAILY BEFORE BREAKFAST. 180 tablet 1  . Dapagliflozin-metFORMIN HCl ER 02-999 MG TB24 Take 2 tablets by mouth every morning.    . dicyclomine (BENTYL) 20 MG tablet Take 2 tablets (40 mg total) by mouth 2 (two) times daily as needed for spasms. 120 tablet 2  . Dulaglutide 1.5 MG/0.5ML SOPN Inject 1.5 mg into the skin every Sunday.    . escitalopram (LEXAPRO) 20  MG tablet TAKE 1 TABLET BY MOUTH ONCE DAILY 30 tablet 1  . etonogestrel (NEXPLANON) 68 MG IMPL implant 1 each by Subdermal route once.    . hydrOXYzine (ATARAX/VISTARIL) 25 MG tablet TAKE 1 TABLET BY MOUTH EVERY NIGHT AT BEDTIME AS NEEDED FOR ANXIETY AND INSOMNIA 30 tablet 3  . lisinopril (ZESTRIL) 40 MG tablet TAKE 1 TABLET BY MOUTH ONCE DAILY 90 tablet 0  . lovastatin (MEVACOR) 40 MG tablet TAKE 1 TABLET (40 MG TOTAL) BY MOUTH AT BEDTIME. 90 tablet 1  .  ondansetron (ZOFRAN-ODT) 4 MG disintegrating tablet DISSOLVE 1 TABLET (4 MG TOTAL) BY MOUTH EVERY 8 (EIGHT) HOURS AS NEEDED. (Patient taking differently: Take 4 mg by mouth every 8 (eight) hours as needed for nausea or vomiting.) 30 tablet 3  . pantoprazole (PROTONIX) 40 MG tablet Take 1 tablet (40 mg total) by mouth 2 (two) times daily. 180 tablet 5  . TRULICITY 1.5 FX/5.8IT SOPN TAKE 1.5MG  INTO THE SKIN EVERY 7 DAYS 2 mL 5  . TRULICITY 1.5 GP/4.9IY SOPN INJECT 1.5 MG UNDER THE SKIN ONCE EVERY 7 DAYS 2 mL 5   No current facility-administered medications for this visit.    Physical Exam:     BP 126/78   Pulse (!) 105   Ht 5\' 2"  (1.575 m)   Wt 294 lb (133.4 kg)   SpO2 99%   BMI 53.77 kg/m   GENERAL:  Pleasant female in NAD PSYCH: : Cooperative, normal affect EENT:  conjunctiva pink, mucous membranes moist, neck supple without masses CARDIAC:  RRR, no murmur heard, no peripheral edema PULM: Normal respiratory effort, lungs CTA bilaterally, no wheezing ABDOMEN: TTP in epigastrium and RUQ.  No rebound or guarding.  No peritoneal signs.  Nondistended, soft, nontender. No obvious masses, no hepatomegaly,  normal bowel sounds SKIN:  turgor, no lesions or vesicles seen Musculoskeletal: TTP along costal margin all the way around to mid back on the right.  Overlying skin normal.  Obese.  Normal muscle tone, normal strength NEURO: Alert and oriented x 3, no focal neurologic deficits   IMPRESSION and PLAN:    1) RUQ pain 2) right back pain While her symptoms clinically would suggest GI/hepatobiliary (i.e. pain worse with greasy, fried foods), her extensive evaluation has otherwise been unrevealing.  Her exam today does have a component of MSK to it as she has TTP with palpation of her mid back and along the seventh/eighth rib costal margins.  Plan for the following: - X-ray to r/o rib fx -Trial of Motrin 400 mg PO BID scheduled x2 weeks - Continue high-dose Protonix for gastric prophylaxis  during trial of NSAIDs -F/u with [facility] as scheduled  3) GERD 4) Hiatal hernia 5) Nausea -Reflux symptoms and nausea well-controlled with high-dose Protonix - Continue Protonix 40 mg bid for now  6) Obesity -Reflux exacerbated by morbid obesity   - RTC in 3 months or sooner as needed    Lavena Bullion ,DO, FACG 03/14/2021, 2:19 PM

## 2021-03-22 ENCOUNTER — Encounter: Payer: Self-pay | Admitting: Family

## 2021-03-26 ENCOUNTER — Other Ambulatory Visit (HOSPITAL_BASED_OUTPATIENT_CLINIC_OR_DEPARTMENT_OTHER): Payer: Self-pay

## 2021-03-26 MED FILL — Dulaglutide Soln Auto-injector 1.5 MG/0.5ML: SUBCUTANEOUS | 28 days supply | Qty: 2 | Fill #2 | Status: AC

## 2021-03-26 MED FILL — Lovastatin Tab 40 MG: ORAL | 90 days supply | Qty: 90 | Fill #0 | Status: AC

## 2021-03-27 ENCOUNTER — Encounter: Payer: Self-pay | Admitting: Family

## 2021-03-29 ENCOUNTER — Other Ambulatory Visit (HOSPITAL_BASED_OUTPATIENT_CLINIC_OR_DEPARTMENT_OTHER): Payer: Self-pay

## 2021-03-29 ENCOUNTER — Other Ambulatory Visit: Payer: Self-pay | Admitting: Family

## 2021-03-29 MED ORDER — HYDROXYZINE HCL 25 MG PO TABS
25.0000 mg | ORAL_TABLET | Freq: Every evening | ORAL | 0 refills | Status: DC | PRN
  Filled 2021-03-29: qty 30, 30d supply, fill #0

## 2021-04-02 ENCOUNTER — Other Ambulatory Visit (HOSPITAL_COMMUNITY): Payer: Self-pay

## 2021-04-02 ENCOUNTER — Other Ambulatory Visit: Payer: Self-pay | Admitting: Family

## 2021-04-02 MED ORDER — AMLODIPINE BESYLATE 10 MG PO TABS
ORAL_TABLET | Freq: Every day | ORAL | 0 refills | Status: DC
Start: 2021-04-02 — End: 2021-06-30
  Filled 2021-04-02 – 2021-04-03 (×2): qty 90, 90d supply, fill #0

## 2021-04-03 ENCOUNTER — Other Ambulatory Visit: Payer: Self-pay | Admitting: Family

## 2021-04-03 ENCOUNTER — Other Ambulatory Visit (HOSPITAL_COMMUNITY): Payer: Self-pay

## 2021-04-03 ENCOUNTER — Other Ambulatory Visit (HOSPITAL_BASED_OUTPATIENT_CLINIC_OR_DEPARTMENT_OTHER): Payer: Self-pay

## 2021-04-03 MED ORDER — TRAMADOL HCL 50 MG PO TABS
50.0000 mg | ORAL_TABLET | Freq: Four times a day (QID) | ORAL | 0 refills | Status: DC | PRN
  Filled 2021-04-03: qty 20, 5d supply, fill #0

## 2021-04-06 ENCOUNTER — Other Ambulatory Visit (HOSPITAL_BASED_OUTPATIENT_CLINIC_OR_DEPARTMENT_OTHER): Payer: Self-pay

## 2021-04-06 ENCOUNTER — Encounter: Payer: Self-pay | Admitting: Family

## 2021-04-06 ENCOUNTER — Telehealth: Payer: Self-pay | Admitting: Family

## 2021-04-06 ENCOUNTER — Other Ambulatory Visit: Payer: Self-pay | Admitting: Family

## 2021-04-06 ENCOUNTER — Ambulatory Visit (INDEPENDENT_AMBULATORY_CARE_PROVIDER_SITE_OTHER): Payer: No Typology Code available for payment source | Admitting: Family

## 2021-04-06 ENCOUNTER — Other Ambulatory Visit: Payer: Self-pay

## 2021-04-06 VITALS — BP 141/89 | HR 99 | Temp 99.2°F | Resp 16 | Wt 294.0 lb

## 2021-04-06 DIAGNOSIS — I10 Essential (primary) hypertension: Secondary | ICD-10-CM | POA: Diagnosis not present

## 2021-04-06 DIAGNOSIS — G8929 Other chronic pain: Secondary | ICD-10-CM

## 2021-04-06 DIAGNOSIS — R109 Unspecified abdominal pain: Secondary | ICD-10-CM | POA: Diagnosis not present

## 2021-04-06 DIAGNOSIS — K219 Gastro-esophageal reflux disease without esophagitis: Secondary | ICD-10-CM | POA: Diagnosis not present

## 2021-04-06 DIAGNOSIS — E1165 Type 2 diabetes mellitus with hyperglycemia: Secondary | ICD-10-CM

## 2021-04-06 DIAGNOSIS — F4321 Adjustment disorder with depressed mood: Secondary | ICD-10-CM

## 2021-04-06 LAB — BASIC METABOLIC PANEL
BUN: 8 mg/dL (ref 6–23)
CO2: 21 mEq/L (ref 19–32)
Calcium: 8.9 mg/dL (ref 8.4–10.5)
Chloride: 102 mEq/L (ref 96–112)
Creatinine, Ser: 0.65 mg/dL (ref 0.40–1.20)
GFR: 108.29 mL/min (ref >60.00)
Glucose, Bld: 151 mg/dL — ABNORMAL HIGH (ref 70–99)
Potassium: 4.2 mEq/L (ref 3.5–5.1)
Sodium: 135 mEq/L (ref 135–145)

## 2021-04-06 LAB — HEMOGLOBIN A1C: Hgb A1c MFr Bld: 7.6 % — ABNORMAL HIGH (ref 4.6–6.5)

## 2021-04-06 MED ORDER — TRULICITY 3 MG/0.5ML ~~LOC~~ SOAJ
3.0000 mg | SUBCUTANEOUS | 5 refills | Status: DC
  Filled 2021-04-06: qty 2, 28d supply, fill #0
  Filled 2021-05-11: qty 2, 28d supply, fill #1
  Filled 2021-06-17: qty 2, 28d supply, fill #2
  Filled 2021-07-16: qty 2, 28d supply, fill #3
  Filled 2021-09-05: qty 2, 28d supply, fill #4

## 2021-04-06 NOTE — Assessment & Plan Note (Signed)
Stable on protonix 40mg  continue same.

## 2021-04-06 NOTE — Assessment & Plan Note (Signed)
Notes improvement with tramadol. Controlled substance contract is signed today per protocol.  Hopefully after her scheduled cholecystectomy we can stop the tramadol.

## 2021-04-06 NOTE — Progress Notes (Signed)
Subjective:   By signing my name below, I, Elizabeth [name], attest that this documentation has been prepared under the direction and in the presence of [name] NP. 04/06/2021     Patient ID: Elizabeth [name], female    DOB: November 06, 1975, 45 y.o.   MRN: [id number]  Chief Complaint  Patient presents with   Abdominal Pain    Still having pain, scheduled for cholecystectomy on 05-11-21.    Depression    Here for follow up    HPI Patient is in today for a office visit. She consulted with her surgeon and plans to get a biopsy of her liver and to removed her gallbladder on May 11, 2021. She continues to have stomach pain. She manages her pain by taking 50 mg tramadol 5-6x daily PO as needed and tylenol and finds relief. Her pain while on medication is 5/10. Her pain without medication is 8-9/10. She is also requesting for her FMLA form to be updated.  Anxiety/Depression- She continues taking 25 mg hydroxyzine daily PO to manage her insomnia. Her mood has slightly worsened due to pain.   Hyperlipidemia- She continues taking 40 mg lovastatin daily PO to manage her hyperlipidemia.  GERD- She continues taking 40 mg Protonix daily PO to manage her GERD.   Health Maintenance Due  Topic Date Due   Pneumococcal Vaccine 45-58 Years old (1 - PCV) Never done   OPHTHALMOLOGY EXAM  03/21/2018   FOOT EXAM  04/19/2020    Past Medical History:  Diagnosis Date   Anemia    Arthritis of left knee    Astigmatism    Depression    Diabetes mellitus without complication (HCC)    Dry mouth    Fatigue    GERD (gastroesophageal reflux disease)    Heartburn    High cholesterol    Hypertension    Keratoconjunctivitis    Muscle pain    Vitamin D deficiency    Weakness     Past Surgical History:  Procedure Laterality Date   BIOPSY  01/18/2021   Procedure: BIOPSY;  Surgeon: Lavena Bullion, DO;  Location: [facility];  Service: Gastroenterology;;   DILATION AND CURETTAGE OF UTERUS      ENDOMETRIAL BIOPSY  12/19/2017   normal per pt   ESOPHAGOGASTRODUODENOSCOPY (EGD) WITH PROPOFOL N/A 01/18/2021   Procedure: ESOPHAGOGASTRODUODENOSCOPY (EGD) WITH PROPOFOL;  Surgeon: Lavena Bullion, DO;  Location: [facility];  Service: Gastroenterology;  Laterality: N/A;   TONSILLECTOMY     TUBAL LIGATION      Family History  Problem Relation Age of Onset   Diabetes Mother    Hypertension Mother    Hyperlipidemia Mother    Obesity Mother    Ovarian cancer Maternal Grandmother    HIV Father        died from complications (IVDU)   Alcohol abuse Father    Drug abuse Father    Colon cancer Neg Hx    Esophageal cancer Neg Hx    Liver disease Neg Hx    Pancreatic cancer Neg Hx    Stomach cancer Neg Hx     Social History   Socioeconomic History   Marital status: Married    Spouse name: [name]   Number of children: 2   Years of education: Not on file   Highest education level: Not on file  Occupational History   Occupation: [employer]    Employer: Industry  Tobacco Use   Smoking status: Every Day  Packs/day: 0.50    Years: 3.00    Pack years: 1.50    Types: Cigarettes   Smokeless tobacco: Never   Tobacco comments:    4-5/day  Vaping Use   Vaping Use: Never used  Substance and Sexual Activity   Alcohol use: No   Drug use: No   Sexual activity: Yes    Partners: Male    Birth control/protection: Surgical, Implant  Other Topics Concern   Not on file  Social History Narrative    2 children   68- son Allen Kell   2000- son Insurance claims handler in [place] in [place]   Enjoys reading   Widowed 07-30-23, husband died from COVID-30.   Social Determinants of Health   Financial Resource Strain: Not on file  Food Insecurity: Not on file  Transportation Needs: Not on file  Physical Activity: Not on file  Stress: Not on file  Social Connections: Not on file  Intimate Partner Violence: Not on file    Outpatient Medications Prior to  Visit  Medication Sig Dispense Refill   amLODipine (NORVASC) 10 MG tablet TAKE 1 TABLET BY MOUTH ONCE DAILY 90 tablet 0   COVID-19 mRNA vaccine, Pfizer, 30 MCG/0.3ML injection AS DIRECTED .3 mL 0   Dapagliflozin-metFORMIN HCl ER (XIGDUO XR) 02-999 MG TB24 TAKE 2 TABLETS BY MOUTH DAILY BEFORE BREAKFAST. 180 tablet 1   Dapagliflozin-metFORMIN HCl ER 02-999 MG TB24 Take 2 tablets by mouth every morning.     dicyclomine (BENTYL) 20 MG tablet Take 2 tablets (40 mg total) by mouth 2 (two) times daily as needed for spasms. 120 tablet 2   Dulaglutide 1.5 MG/0.5ML SOPN Inject 1.5 mg into the skin every Sunday.     escitalopram (LEXAPRO) 20 MG tablet TAKE 1 TABLET BY MOUTH ONCE DAILY 30 tablet 1   etonogestrel (NEXPLANON) 68 MG IMPL implant 1 each by Subdermal route once.     hydrOXYzine (ATARAX/VISTARIL) 25 MG tablet Take 1 tablet (25 mg total) by mouth at bedtime as needed for anxiety (insomnia). 30 tablet 0   lisinopril (ZESTRIL) 40 MG tablet TAKE 1 TABLET BY MOUTH ONCE DAILY 90 tablet 0   lovastatin (MEVACOR) 40 MG tablet TAKE 1 TABLET (40 MG TOTAL) BY MOUTH AT BEDTIME. 90 tablet 1   ondansetron (ZOFRAN-ODT) 4 MG disintegrating tablet DISSOLVE 1 TABLET (4 MG TOTAL) BY MOUTH EVERY 8 (EIGHT) HOURS AS NEEDED. (Patient taking differently: Take 4 mg by mouth every 8 (eight) hours as needed for nausea or vomiting.) 30 tablet 3   pantoprazole (PROTONIX) 40 MG tablet Take 1 tablet (40 mg total) by mouth 2 (two) times daily. 180 tablet 5   traMADol (ULTRAM) 50 MG tablet Take 1 tablet (50 mg total) by mouth every 6 (six) hours as needed for up to 5 days. 20 tablet 0   TRULICITY 1.5 JJ/8.8CZ SOPN TAKE 1.5MG  INTO THE SKIN EVERY 7 DAYS 2 mL 5   No facility-administered medications prior to visit.    No Known Allergies  Review of Systems  Gastrointestinal:  Positive for abdominal pain.      Objective:    Physical Exam Constitutional:      General: She is not in acute distress.    Appearance: Normal  appearance. She is not ill-appearing.  HENT:     Head: Normocephalic and atraumatic.     Right Ear: External ear normal.     Left Ear: External ear normal.  Eyes:     Extraocular Movements: Extraocular movements  intact.     Pupils: Pupils are equal, round, and reactive to light.  Cardiovascular:     Rate and Rhythm: Normal rate and regular rhythm.     Pulses: Normal pulses.     Heart sounds: Normal heart sounds. No murmur heard.   No gallop.  Pulmonary:     Effort: Pulmonary effort is normal. No respiratory distress.     Breath sounds: Normal breath sounds. No wheezing, rhonchi or rales.  Abdominal:     General: Bowel sounds are normal.     Palpations: Abdomen is soft.     Tenderness: There is abdominal tenderness in the right upper quadrant. There is no guarding.  Skin:    General: Skin is warm and dry.  Neurological:     Mental Status: She is alert and oriented to person, place, and time.  Psychiatric:        Behavior: Behavior normal.    BP (!) 141/89 (BP Location: Right Arm, Patient Position: Sitting, Cuff Size: Large)   Pulse 99   Temp 99.2 F (37.3 C) (Oral)   Resp 16   Wt 294 lb (133.4 kg)   SpO2 100%   BMI 53.77 kg/m  Wt Readings from Last 3 Encounters:  04/06/21 294 lb (133.4 kg)  03/14/21 294 lb (133.4 kg)  01/18/21 289 lb 14.5 oz (131.5 kg)       Assessment & Plan:   Problem List Items Addressed This Visit       Unprioritized   Type 2 diabetes mellitus with hyperglycemia, without long-term current use of insulin (HCC) - Primary    Lab Results  Component Value Date   HGBA1C 6.9 (H) 12/08/2020  Due for follow up A1C today.        Relevant Orders   Hemoglobin Z6X   Basic metabolic panel   Grief reaction    She continues to grieve the loss of her husband. She will bring me a new FMLA so we can continue her intermittent leave. Continue lexapro 20mg .        Gastroesophageal reflux disease without esophagitis    Stable on protonix 40mg  continue  same.        Essential hypertension    BP Readings from Last 3 Encounters:  04/06/21 (!) 141/89  03/14/21 126/78  02/13/21 124/72  BP mildly elevated today. Continue lisinopril 40mg  once daily.        Chronic abdominal pain    Notes improvement with tramadol. Controlled substance contract is signed today per protocol.  Hopefully after her scheduled cholecystectomy we can stop the tramadol.          No orders of the defined types were placed in this encounter.   I, [name] NP, personally preformed the services described in this documentation.  All medical record entries made by the scribe were at my direction and in my presence.  I have reviewed the chart and discharge instructions (if applicable) and agree that the record reflects my personal performance and is accurate and complete. 04/06/2021   I,Elizabeth [name],acting as a Education administrator for [name], NP.,have documented all relevant documentation on the behalf of [name], NP,as directed by  [name], NP while in the presence of [name], NP.   [name], NP

## 2021-04-06 NOTE — Telephone Encounter (Signed)
A1C has risen from 6.9 to 7.6. I would like her to increase Trulicity to 3mg  once weekly from 1.5mg .

## 2021-04-06 NOTE — Assessment & Plan Note (Signed)
She continues to grieve the loss of her husband. She will bring me a new FMLA so we can continue her intermittent leave. Continue lexapro 20mg .

## 2021-04-06 NOTE — Patient Instructions (Signed)
Please complete lab work prior to leaving.   

## 2021-04-06 NOTE — Assessment & Plan Note (Signed)
BP Readings from Last 3 Encounters:  04/06/21 (!) 141/89  03/14/21 126/78  02/13/21 124/72   BP mildly elevated today. Continue lisinopril 40mg  once daily.

## 2021-04-06 NOTE — Assessment & Plan Note (Signed)
Lab Results  Component Value Date   HGBA1C 6.9 (H) 12/08/2020   Due for follow up A1C today.

## 2021-04-09 ENCOUNTER — Other Ambulatory Visit (HOSPITAL_BASED_OUTPATIENT_CLINIC_OR_DEPARTMENT_OTHER): Payer: Self-pay

## 2021-04-09 MED ORDER — TRAMADOL HCL 50 MG PO TABS
50.0000 mg | ORAL_TABLET | Freq: Four times a day (QID) | ORAL | 0 refills | Status: AC | PRN
  Filled 2021-04-09: qty 90, 23d supply, fill #0

## 2021-04-13 ENCOUNTER — Ambulatory Visit: Payer: No Typology Code available for payment source | Admitting: Family

## 2021-04-18 ENCOUNTER — Encounter: Payer: Self-pay | Admitting: Family

## 2021-04-19 ENCOUNTER — Other Ambulatory Visit: Payer: Self-pay | Admitting: Family

## 2021-04-19 ENCOUNTER — Telehealth: Payer: Self-pay | Admitting: Family

## 2021-04-19 NOTE — Telephone Encounter (Signed)
Document faxed to office for provider to fill out [facility] FMLA - 3 pages) Document put at front office tray under providers name.

## 2021-04-20 ENCOUNTER — Other Ambulatory Visit (HOSPITAL_BASED_OUTPATIENT_CLINIC_OR_DEPARTMENT_OTHER): Payer: Self-pay

## 2021-04-20 MED ORDER — HYDROXYZINE HCL 25 MG PO TABS
50.0000 mg | ORAL_TABLET | Freq: Every evening | ORAL | 3 refills | Status: DC | PRN
  Filled 2021-04-20 – 2021-04-26 (×2): qty 60, 30d supply, fill #0
  Filled 2021-05-25: qty 60, 30d supply, fill #1
  Filled 2021-06-17: qty 60, 30d supply, fill #2
  Filled 2021-07-16: qty 60, 30d supply, fill #3

## 2021-04-20 NOTE — Telephone Encounter (Signed)
Forms placed in provider's folder to be completed

## 2021-04-20 NOTE — Telephone Encounter (Signed)
Patient comment: This is supposed to change to 2 at night.

## 2021-04-24 ENCOUNTER — Other Ambulatory Visit (HOSPITAL_BASED_OUTPATIENT_CLINIC_OR_DEPARTMENT_OTHER): Payer: Self-pay

## 2021-04-24 DIAGNOSIS — Z0279 Encounter for issue of other medical certificate: Secondary | ICD-10-CM

## 2021-04-26 ENCOUNTER — Other Ambulatory Visit (HOSPITAL_BASED_OUTPATIENT_CLINIC_OR_DEPARTMENT_OTHER): Payer: Self-pay

## 2021-04-30 ENCOUNTER — Encounter: Payer: Self-pay | Admitting: Family

## 2021-05-01 ENCOUNTER — Other Ambulatory Visit (HOSPITAL_BASED_OUTPATIENT_CLINIC_OR_DEPARTMENT_OTHER): Payer: Self-pay

## 2021-05-01 NOTE — Patient Instructions (Addendum)
DUE TO COVID-19 ONLY ONE VISITOR IS ALLOWED TO COME WITH YOU AND STAY IN THE WAITING ROOM ONLY DURING PRE OP AND PROCEDURE DAY OF SURGERY. THE 1 VISITOR  MAY VISIT WITH YOU AFTER SURGERY IN YOUR PRIVATE ROOM DURING VISITING HOURS ONLY!                [place]     Your procedure is scheduled on: 05/11/21   Report to [facility] Main  Entrance   Report to short stay at 5:15 AM     Call this number if you have problems the morning of surgery [phone]    Remember: Do not eat food or drink liquids :After Midnight.   BRUSH YOUR TEETH MORNING OF SURGERY AND RINSE YOUR MOUTH OUT, NO CHEWING GUM CANDY OR MINTS.     Take these medicines the morning of surgery with A SIP OF WATER: Lexapro, Amlodipine  DO NOT TAKE ANY DIABETIC MEDICATIONS DAY OF YOUR SURGERY How to Manage Your Diabetes Before and After Surgery  Why is it important to control my blood sugar before and after surgery? Improving blood sugar levels before and after surgery helps healing and can limit problems. A way of improving blood sugar control is eating a healthy diet by:  Eating less sugar and carbohydrates  Increasing activity/exercise  Talking with your doctor about reaching your blood sugar goals High blood sugars (greater than 180 mg/dL) can raise your risk of infections and slow your recovery, so you will need to focus on controlling your diabetes during the weeks before surgery. Make sure that the doctor who takes care of your diabetes knows about your planned surgery including the date and location.  How do I manage my blood sugar before surgery? Check your blood sugar at least 4 times a day, starting 2 days before surgery, to make sure that the level is not too high or low. Check your blood sugar the morning of your surgery when you wake up and every 2 hours until you get to the Short Stay unit. If your blood sugar is less than 70 mg/dL, you will need to treat for low blood sugar: Do not take  insulin. Treat a low blood sugar (less than 70 mg/dL) with  cup of clear juice (cranberry or apple), 4 glucose tablets, OR glucose gel. Recheck blood sugar in 15 minutes after treatment (to make sure it is greater than 70 mg/dL). If your blood sugar is not greater than 70 mg/dL on recheck, call [phone] for further instructions. Report your blood sugar to the short stay nurse when you get to Short Stay.  If you are admitted to the hospital after surgery: Your blood sugar will be checked by the staff and you will probably be given insulin after surgery (instead of oral diabetes medicines) to make sure you have good blood sugar levels. The goal for blood sugar control after surgery is 80-180 mg/dL.   WHAT DO I DO ABOUT MY DIABETES MEDICATION?       Do not take Xigduo XR the day before surgery.  Do not take oral diabetes medicines (pills) the morning of surgery.  The day of surgery, do not take other diabetes injectables, including Byetta (exenatide), Bydureon (exenatide ER), Victoza (liraglutide), or Trulicity (dulaglutide).  .                                   You may not  have any metal on your body including hair pins and              piercings  Do not wear jewelry, make-up, lotions, powders or perfumes, deodorant             Do not wear nail polish on your fingernails.  Do not shave  48 hours prior to surgery.            .   Do not bring valuables to the hospital. [place].  Contacts, dentures or bridgework may not be worn into surgery.     Patients discharged the day of surgery will not be allowed to drive home.   IF YOU ARE HAVING SURGERY AND GOING HOME THE SAME DAY, YOU MUST HAVE AN ADULT TO DRIVE YOU HOME AND BE WITH YOU FOR 24 HOURS.   YOU MAY GO HOME BY TAXI OR UBER OR ORTHERWISE, BUT AN ADULT MUST ACCOMPANY YOU HOME AND STAY WITH YOU FOR 24 HOURS.  Name and phone number of your driver:  Special Instructions: N/A               Please read over the following fact sheets you were given: _____________________________________________________________________             [facility] - Preparing for Surgery Before surgery, you can play an important role.  Because skin is not sterile, your skin needs to be as free of germs as possible.  You can reduce the number of germs on your skin by washing with CHG (chlorahexidine gluconate) soap before surgery.  CHG is an antiseptic cleaner which kills germs and bonds with the skin to continue killing germs even after washing. Please DO NOT use if you have an allergy to CHG or antibacterial soaps.  If your skin becomes reddened/irritated stop using the CHG and inform your nurse when you arrive at Short Stay. Do not shave (including legs and underarms) for at least 48 hours prior to the first CHG shower.   Please follow these instructions carefully:  1.  Shower with CHG Soap the night before surgery and the  morning of Surgery.  2.  If you choose to wash your hair, wash your hair first as usual with your  normal  shampoo.  3.  After you shampoo, rinse your hair and body thoroughly to remove the  shampoo.                                        4.  Use CHG as you would any other liquid soap.  You can apply chg directly  to the skin and wash                       Gently with a scrungie or clean washcloth.  5.  Apply the CHG Soap to your body ONLY FROM THE NECK DOWN.   Do not use on face/ open                           Wound or open sores. Avoid contact with eyes, ears mouth and genitals (private parts).  Wash face,  Genitals (private parts) with your normal soap.             6.  Wash thoroughly, paying special attention to the area where your surgery  will be performed.  7.  Thoroughly rinse your body with warm water from the neck down.  8.  DO NOT shower/wash with your normal soap after using and rinsing off  the CHG Soap.             9.  Pat yourself dry with  a clean towel.            10.  Wear clean pajamas.            11.  Place clean sheets on your bed the night of your first shower and do not  sleep with pets. Day of Surgery : Do not apply any lotions/deodorants the morning of surgery.  Please wear clean clothes to the hospital/surgery center.  FAILURE TO FOLLOW THESE INSTRUCTIONS MAY RESULT IN THE CANCELLATION OF YOUR SURGERY PATIENT SIGNATURE_________________________________  NURSE SIGNATURE__________________________________  ________________________________________________________________________

## 2021-05-02 ENCOUNTER — Other Ambulatory Visit: Payer: Self-pay

## 2021-05-02 ENCOUNTER — Encounter (HOSPITAL_COMMUNITY)
Admission: RE | Admit: 2021-05-02 | Discharge: 2021-05-02 | Disposition: A | Discharge status: Home / Self Care | Payer: No Typology Code available for payment source | Source: Ambulatory Visit | Attending: Surgery | Admitting: Surgery

## 2021-05-02 ENCOUNTER — Encounter (HOSPITAL_COMMUNITY): Payer: Self-pay

## 2021-05-02 ENCOUNTER — Other Ambulatory Visit (HOSPITAL_BASED_OUTPATIENT_CLINIC_OR_DEPARTMENT_OTHER): Payer: Self-pay

## 2021-05-02 ENCOUNTER — Encounter: Payer: Self-pay | Admitting: Family

## 2021-05-02 ENCOUNTER — Telehealth: Payer: No Typology Code available for payment source | Admitting: Family

## 2021-05-02 DIAGNOSIS — J019 Acute sinusitis, unspecified: Secondary | ICD-10-CM | POA: Diagnosis not present

## 2021-05-02 DIAGNOSIS — Z01818 Encounter for other preprocedural examination: Secondary | ICD-10-CM | POA: Insufficient documentation

## 2021-05-02 LAB — BASIC METABOLIC PANEL
Anion gap: 9 (ref 5–15)
BUN: 7 mg/dL (ref 6–20)
CO2: 23 mmol/L (ref 22–32)
Calcium: 9 mg/dL (ref 8.9–10.3)
Chloride: 103 mmol/L (ref 98–111)
Creatinine, Ser: 0.7 mg/dL (ref 0.44–1.00)
GFR, Estimated: >60 mL/min (ref >60)
Glucose, Bld: 160 mg/dL — ABNORMAL HIGH (ref 70–99)
Potassium: 3.9 mmol/L (ref 3.5–5.1)
Sodium: 135 mmol/L (ref 135–145)

## 2021-05-02 LAB — CBC
HCT: 44.9 % (ref 36.0–46.0)
Hemoglobin: 13.8 g/dL (ref 12.0–15.0)
MCH: 23 pg — ABNORMAL LOW (ref 26.0–34.0)
MCHC: 30.7 g/dL (ref 30.0–36.0)
MCV: 75 fL — ABNORMAL LOW (ref 80.0–100.0)
Platelets: 282 10*3/uL (ref 150–400)
RBC: 5.99 MIL/uL — ABNORMAL HIGH (ref 3.87–5.11)
RDW: 16.2 % — ABNORMAL HIGH (ref 11.5–15.5)
WBC: 7.4 10*3/uL (ref 4.0–10.5)
nRBC: 0 % (ref 0.0–0.2)

## 2021-05-02 LAB — GLUCOSE, CAPILLARY: Glucose-Capillary: 175 mg/dL — ABNORMAL HIGH (ref 70–99)

## 2021-05-02 MED ORDER — ALBUTEROL SULFATE HFA 108 (90 BASE) MCG/ACT IN AERS
2.0000 | INHALATION_SPRAY | Freq: Four times a day (QID) | RESPIRATORY_TRACT | 0 refills | Status: DC | PRN
  Filled 2021-05-02: qty 8.5, 25d supply, fill #0

## 2021-05-02 MED ORDER — AMOXICILLIN-POT CLAVULANATE 875-125 MG PO TABS
1.0000 | ORAL_TABLET | Freq: Two times a day (BID) | ORAL | 0 refills | Status: DC
  Filled 2021-05-02: qty 14, 7d supply, fill #0

## 2021-05-02 NOTE — Progress Notes (Signed)
Virtual Visit Consent   Elizabeth [name], you are scheduled for a virtual visit with a [facility] provider today.     Just as with appointments in the office, your consent must be obtained to participate.  Your consent will be active for this visit and any virtual visit you may have with one of our providers in the next 365 days.     If you have a MyChart account, a copy of this consent can be sent to you electronically.  All virtual visits are billed to your insurance company just like a traditional visit in the office.    As this is a virtual visit, video technology does not allow for your provider to perform a traditional examination.  This may limit your provider's ability to fully assess your condition.  If your provider identifies any concerns that need to be evaluated in person or the need to arrange testing (such as labs, EKG, etc.), we will make arrangements to do so.     Although advances in technology are sophisticated, we cannot ensure that it will always work on either your end or our end.  If the connection with a video visit is poor, the visit may have to be switched to a telephone visit.  With either a video or telephone visit, we are not always able to ensure that we have a secure connection.     I need to obtain your verbal consent now.   Are you willing to proceed with your visit today?    Elizabeth [name] has provided verbal consent on 05/02/2021 for a virtual visit (video or telephone).   [name], FNP   Date: 05/02/2021 10:42 AM   Virtual Visit via Video Note   I, [name], connected with  Elizabeth [name]  ([id number], 10/25/1975) on 05/02/21 at 10:45 AM EDT by a video-enabled telemedicine application and verified that I am speaking with the correct person using two identifiers.  Location: Patient: Virtual Visit Location Patient: Home Provider: Virtual Visit Location Provider: Home   I discussed the limitations of evaluation and management by telemedicine and  the availability of in person appointments. The patient expressed understanding and agreed to proceed.    History of Present Illness: Elizabeth [name] is a 45 y.o. who identifies as a female who was assigned female at birth, and is being seen today for cough and sinus problems. She had a negative COVID test.   HPI: Sinusitis This is a new problem. The current episode started in the past 7 days. The problem has been gradually worsening since onset. There has been no fever. The pain is moderate. Associated symptoms include congestion, coughing, headaches, shortness of breath, sinus pressure and a sore throat. Pertinent negatives include no ear pain, hoarse voice or sneezing. Past treatments include oral decongestants and acetaminophen. The treatment provided mild relief.   Problems:  Patient Active Problem List   Diagnosis Date Noted   Chronic abdominal pain 04/06/2021   Grief reaction 04/06/2021   Gastroesophageal reflux disease without esophagitis    RUQ pain    Abdominal pain, epigastric    Hyperlipidemia 01/21/2018   Uncontrolled type 2 diabetes mellitus with hyperglycemia ([place]) 01/21/2018   Keratoconjunctivitis sicca of both eyes not specified as Sjogren's 04/04/2017   Regular astigmatism of both eyes 04/04/2017   Vitamin D deficiency 12/02/2016   Essential hypertension 02/07/2016   Morbid obesity with BMI of 50.0-59.9, adult ([place]) 02/07/2016   Pure hypercholesterolemia 02/07/2016   Type 2 diabetes mellitus with hyperglycemia, without  long-term current use of insulin ([place]) 02/07/2016    Allergies: No Known Allergies Medications:  Current Outpatient Medications:    albuterol (VENTOLIN HFA) 108 (90 Base) MCG/ACT inhaler, Inhale 2 puffs into the lungs every 6 (six) hours as needed for wheezing or shortness of breath., Disp: 8 g, Rfl: 0   amoxicillin-clavulanate (AUGMENTIN) 875-125 MG tablet, Take 1 tablet by mouth 2 (two) times daily., Disp: 14 tablet, Rfl: 0   amLODipine (NORVASC) 10 MG  tablet, TAKE 1 TABLET BY MOUTH ONCE DAILY (Patient taking differently: Take 10 mg by mouth daily.), Disp: 90 tablet, Rfl: 0   COVID-19 mRNA vaccine, Pfizer, 30 MCG/0.3ML injection, AS DIRECTED (Patient not taking: No sig reported), Disp: .3 mL, Rfl: 0   Dapagliflozin-metFORMIN HCl ER (XIGDUO XR) 02-999 MG TB24, TAKE 2 TABLETS BY MOUTH DAILY BEFORE BREAKFAST. (Patient taking differently: Take 2 tablets by mouth daily.), Disp: 180 tablet, Rfl: 1   dicyclomine (BENTYL) 20 MG tablet, Take 2 tablets (40 mg total) by mouth 2 (two) times daily as needed for spasms., Disp: 120 tablet, Rfl: 2   Dulaglutide (TRULICITY) 3 PY/1.9JK SOPN, Inject 3 mg as directed once a week., Disp: 2 mL, Rfl: 5   escitalopram (LEXAPRO) 20 MG tablet, TAKE 1 TABLET BY MOUTH ONCE DAILY (Patient taking differently: Take 10 mg by mouth daily.), Disp: 30 tablet, Rfl: 1   etonogestrel (NEXPLANON) 68 MG IMPL implant, 1 each by Subdermal route once., Disp: , Rfl:    hydrOXYzine (ATARAX/VISTARIL) 25 MG tablet, Take 2 tablets (50 mg total) by mouth at bedtime as needed for insomnia. (Patient taking differently: Take 50 mg by mouth at bedtime.), Disp: 60 tablet, Rfl: 3   lisinopril (ZESTRIL) 40 MG tablet, TAKE 1 TABLET BY MOUTH ONCE DAILY (Patient taking differently: Take 40 mg by mouth daily.), Disp: 90 tablet, Rfl: 0   lovastatin (MEVACOR) 40 MG tablet, TAKE 1 TABLET (40 MG TOTAL) BY MOUTH AT BEDTIME. (Patient taking differently: Take 40 mg by mouth at bedtime.), Disp: 90 tablet, Rfl: 1   ondansetron (ZOFRAN-ODT) 4 MG disintegrating tablet, DISSOLVE 1 TABLET (4 MG TOTAL) BY MOUTH EVERY 8 (EIGHT) HOURS AS NEEDED. (Patient taking differently: Take 4 mg by mouth every 8 (eight) hours as needed for nausea or vomiting.), Disp: 30 tablet, Rfl: 3   pantoprazole (PROTONIX) 40 MG tablet, Take 1 tablet (40 mg total) by mouth 2 (two) times daily., Disp: 180 tablet, Rfl: 5   traMADol (ULTRAM) 50 MG tablet, Take 1 tablet (50 mg total) by mouth every 6  (six) hours as needed. (Patient taking differently: Take 50 mg by mouth every 6 (six) hours as needed for severe pain.), Disp: 90 tablet, Rfl: 0  Observations/Objective: Patient is well-developed, well-nourished in no acute distress.  Resting comfortably  at home.  Head is normocephalic, atraumatic.  No labored breathing. Mild SOB  Speech is clear and coherent with logical content.  Patient is alert and oriented at baseline.  Intermittent coarse cough   Assessment and Plan: 1. Acute sinusitis, recurrence not specified, unspecified location - amoxicillin-clavulanate (AUGMENTIN) 875-125 MG tablet; Take 1 tablet by mouth 2 (two) times daily.  Dispense: 14 tablet; Refill: 0 - albuterol (VENTOLIN HFA) 108 (90 Base) MCG/ACT inhaler; Inhale 2 puffs into the lungs every 6 (six) hours as needed for wheezing or shortness of breath.  Dispense: 8 g; Refill: 0 - Take meds as prescribed - Use a cool mist humidifier  -Use saline nose sprays frequently -Force fluids -For any cough or congestion  Use plain Mucinex- regular strength or max strength is fine -For fever or aces or pains- take tylenol or ibuprofen. -Throat lozenges if help   Follow Up Instructions: I discussed the assessment and treatment plan with the patient. The patient was provided an opportunity to ask questions and all were answered. The patient agreed with the plan and demonstrated an understanding of the instructions.  A copy of instructions were sent to the patient via MyChart.  The patient was advised to call back or seek an in-person evaluation if the symptoms worsen or if the condition fails to improve as anticipated.  Time:  I spent 7 minutes with the patient via telehealth technology discussing the above problems/concerns.    [name], FNP

## 2021-05-02 NOTE — Progress Notes (Signed)
COVID Vaccine Completed:Yes Date COVID Vaccine completed:12/03/19-booster 09/08/20 COVID vaccine manufacturer: [place]     PCP - [name] LOV 04/06/21 Cardiologist - none  Chest x-ray - 03/15/21-epic EKG - 05/02/21-chart Stress Test - no ECHO - no Cardiac Cath - no Pacemaker/ICD device last checked:NA  Sleep Study - No CPAP -   Fasting Blood Sugar - 120-187. Last A1c was 7.6 on 04/06/21 Checks Blood Sugar _QD____ times a day  Blood Thinner Instructions:NA Aspirin Instructions: Last Dose:  Anesthesia review: no  Patient denies shortness of breath, fever, cough and chest pain at PAT appointment Yes Pt's BMI 52.4. She can climb 2 flights of stairs and ADLs without SOB . Sometimes she gets winded doing house work especially with the pain.    Patient verbalized understanding of instructions that were given to them at the PAT appointment. Patient was also instructed that they will need to review over the PAT instructions again at home before surgery. Yes Pt had clod symptoms on 04/30/21 covid test was negative.

## 2021-05-10 NOTE — Anesthesia Preprocedure Evaluation (Addendum)
Anesthesia Evaluation  Patient identified by MRN, date of birth, ID band Patient awake    Reviewed: Allergy & Precautions, NPO status , Patient's Chart, lab work & pertinent test results  History of Anesthesia Complications Negative for: history of anesthetic complications  Airway Mallampati: I  TM Distance: >3 FB Neck ROM: Full    Dental  (+) Dental Advisory Given   Pulmonary COPD,  COPD inhaler, Recent URI , Residual Cough, Current Smoker and Patient abstained from smoking.,    breath sounds clear to auscultation       Cardiovascular hypertension, Pt. on medications (-) angina Rhythm:Regular Rate:Normal  2020 Stress: EF 54%, no ischemia   Neuro/Psych Depression negative neurological ROS     GI/Hepatic Neg liver ROS, GERD  Medicated and Controlled,  Endo/Other  diabetes (glu 197)Morbid obesity  Renal/GU negative Renal ROS     Musculoskeletal   Abdominal (+) + obese,   Peds  Hematology negative hematology ROS (+)   Anesthesia Other Findings   Reproductive/Obstetrics                            Anesthesia Physical Anesthesia Plan  ASA: 3  Anesthesia Plan: General   Post-op Pain Management:    Induction: Intravenous  PONV Risk Score and Plan: 2 and Ondansetron, Dexamethasone and Scopolamine patch - Pre-op  Airway Management Planned: Oral ETT  Additional Equipment: None  Intra-op Plan:   Post-operative Plan: Extubation in OR  Informed Consent: I have reviewed the patients History and Physical, chart, labs and discussed the procedure including the risks, benefits and alternatives for the proposed anesthesia with the patient or authorized representative who has indicated his/her understanding and acceptance.     Dental advisory given  Plan Discussed with: CRNA and Surgeon  Anesthesia Plan Comments:        Anesthesia Quick Evaluation

## 2021-05-11 ENCOUNTER — Other Ambulatory Visit (HOSPITAL_COMMUNITY): Payer: Self-pay

## 2021-05-11 ENCOUNTER — Encounter (HOSPITAL_COMMUNITY)
Admission: RE | Disposition: A | Discharge status: Home / Self Care | Payer: Self-pay | Source: Home / Self Care | Attending: Surgery

## 2021-05-11 ENCOUNTER — Ambulatory Visit (HOSPITAL_COMMUNITY)
Admission: RE | Admit: 2021-05-11 | Discharge: 2021-05-11 | Disposition: A | Discharge status: Home / Self Care | Payer: No Typology Code available for payment source | Attending: Surgery | Admitting: Surgery

## 2021-05-11 ENCOUNTER — Ambulatory Visit (HOSPITAL_COMMUNITY): Payer: No Typology Code available for payment source | Admitting: Anesthesiology

## 2021-05-11 ENCOUNTER — Ambulatory Visit (HOSPITAL_COMMUNITY): Payer: No Typology Code available for payment source

## 2021-05-11 ENCOUNTER — Other Ambulatory Visit (HOSPITAL_BASED_OUTPATIENT_CLINIC_OR_DEPARTMENT_OTHER): Payer: Self-pay

## 2021-05-11 ENCOUNTER — Encounter (HOSPITAL_COMMUNITY): Payer: Self-pay | Admitting: Surgery

## 2021-05-11 DIAGNOSIS — Z793 Long term (current) use of hormonal contraceptives: Secondary | ICD-10-CM | POA: Diagnosis not present

## 2021-05-11 DIAGNOSIS — Z7984 Long term (current) use of oral hypoglycemic drugs: Secondary | ICD-10-CM | POA: Insufficient documentation

## 2021-05-11 DIAGNOSIS — R1011 Right upper quadrant pain: Secondary | ICD-10-CM | POA: Diagnosis present

## 2021-05-11 DIAGNOSIS — Z79899 Other long term (current) drug therapy: Secondary | ICD-10-CM | POA: Diagnosis not present

## 2021-05-11 DIAGNOSIS — K811 Chronic cholecystitis: Secondary | ICD-10-CM | POA: Diagnosis not present

## 2021-05-11 DIAGNOSIS — R109 Unspecified abdominal pain: Secondary | ICD-10-CM

## 2021-05-11 DIAGNOSIS — K219 Gastro-esophageal reflux disease without esophagitis: Secondary | ICD-10-CM | POA: Insufficient documentation

## 2021-05-11 DIAGNOSIS — Z792 Long term (current) use of antibiotics: Secondary | ICD-10-CM | POA: Diagnosis not present

## 2021-05-11 DIAGNOSIS — F32A Depression, unspecified: Secondary | ICD-10-CM | POA: Diagnosis not present

## 2021-05-11 DIAGNOSIS — F17210 Nicotine dependence, cigarettes, uncomplicated: Secondary | ICD-10-CM | POA: Diagnosis not present

## 2021-05-11 DIAGNOSIS — Z20822 Contact with and (suspected) exposure to covid-19: Secondary | ICD-10-CM | POA: Diagnosis not present

## 2021-05-11 DIAGNOSIS — K819 Cholecystitis, unspecified: Secondary | ICD-10-CM

## 2021-05-11 HISTORY — PX: CHOLECYSTECTOMY: SHX55

## 2021-05-11 LAB — GLUCOSE, CAPILLARY
Glucose-Capillary: 197 mg/dL — ABNORMAL HIGH (ref 70–99)
Glucose-Capillary: 273 mg/dL — ABNORMAL HIGH (ref 70–99)
Glucose-Capillary: 262 mg/dL — ABNORMAL HIGH (ref 70–99)

## 2021-05-11 LAB — SARS CORONAVIRUS 2 BY RT PCR (HOSPITAL ORDER, PERFORMED IN ~~LOC~~ HOSPITAL LAB): SARS Coronavirus 2: NEGATIVE

## 2021-05-11 LAB — PREGNANCY, URINE: Preg Test, Ur: NEGATIVE

## 2021-05-11 SURGERY — LAPAROSCOPIC CHOLECYSTECTOMY
Anesthesia: General

## 2021-05-11 MED ORDER — OXYCODONE HCL 5 MG PO TABS
5.0000 mg | ORAL_TABLET | Freq: Once | ORAL | Status: AC | PRN
  Administered 2021-05-11: 5 mg via ORAL

## 2021-05-11 MED ORDER — KETAMINE HCL 10 MG/ML IJ SOLN
INTRAMUSCULAR | Status: AC
  Filled 2021-05-11: qty 1

## 2021-05-11 MED ORDER — KETAMINE HCL 10 MG/ML IJ SOLN
INTRAMUSCULAR | Status: DC | PRN
  Administered 2021-05-11: 30 mg via INTRAVENOUS

## 2021-05-11 MED ORDER — DEXAMETHASONE SODIUM PHOSPHATE 10 MG/ML IJ SOLN
INTRAMUSCULAR | Status: DC | PRN
  Administered 2021-05-11: 8 mg via INTRAVENOUS

## 2021-05-11 MED ORDER — BUPIVACAINE-EPINEPHRINE 0.25% -1:200000 IJ SOLN
INTRAMUSCULAR | Status: DC | PRN
  Administered 2021-05-11: 20 mL

## 2021-05-11 MED ORDER — PROMETHAZINE HCL 25 MG/ML IJ SOLN: 6.2500 mg | INTRAMUSCULAR | Status: DC | PRN

## 2021-05-11 MED ORDER — MIDAZOLAM HCL 2 MG/2ML IJ SOLN
INTRAMUSCULAR | Status: AC
  Filled 2021-05-11: qty 2

## 2021-05-11 MED ORDER — HYDROMORPHONE HCL 1 MG/ML IJ SOLN
INTRAMUSCULAR | Status: AC
  Filled 2021-05-11: qty 1

## 2021-05-11 MED ORDER — HYDROMORPHONE HCL 1 MG/ML IJ SOLN
0.2500 mg | INTRAMUSCULAR | Status: DC | PRN
  Administered 2021-05-11 (×2): 0.5 mg via INTRAVENOUS

## 2021-05-11 MED ORDER — FENTANYL CITRATE (PF) 250 MCG/5ML IJ SOLN
INTRAMUSCULAR | Status: AC
  Filled 2021-05-11: qty 5

## 2021-05-11 MED ORDER — INSULIN ASPART 100 UNIT/ML IJ SOLN
5.0000 [IU] | Freq: Once | INTRAMUSCULAR | Status: AC
  Administered 2021-05-11: 5 [IU] via SUBCUTANEOUS

## 2021-05-11 MED ORDER — ONDANSETRON HCL 4 MG/2ML IJ SOLN
INTRAMUSCULAR | Status: DC | PRN
  Administered 2021-05-11: 4 mg via INTRAVENOUS

## 2021-05-11 MED ORDER — ROCURONIUM BROMIDE 10 MG/ML (PF) SYRINGE
PREFILLED_SYRINGE | INTRAVENOUS | Status: AC
  Filled 2021-05-11: qty 10

## 2021-05-11 MED ORDER — CEFAZOLIN IN SODIUM CHLORIDE 3-0.9 GM/100ML-% IV SOLN
INTRAVENOUS | Status: AC
  Filled 2021-05-11: qty 100

## 2021-05-11 MED ORDER — OXYCODONE HCL 5 MG PO TABS
ORAL_TABLET | ORAL | Status: AC
  Filled 2021-05-11: qty 1

## 2021-05-11 MED ORDER — FENTANYL CITRATE (PF) 250 MCG/5ML IJ SOLN
INTRAMUSCULAR | Status: DC | PRN
  Administered 2021-05-11: 100 ug via INTRAVENOUS
  Administered 2021-05-11: 50 ug via INTRAVENOUS
  Administered 2021-05-11: 150 ug via INTRAVENOUS
  Administered 2021-05-11 (×3): 50 ug via INTRAVENOUS

## 2021-05-11 MED ORDER — LIDOCAINE HCL (CARDIAC) PF 100 MG/5ML IV SOSY
PREFILLED_SYRINGE | INTRAVENOUS | Status: DC | PRN
  Administered 2021-05-11: 20 mg via INTRAVENOUS

## 2021-05-11 MED ORDER — PROPOFOL 10 MG/ML IV BOLUS
INTRAVENOUS | Status: DC | PRN
Start: 2021-05-11 — End: 2021-05-11
  Administered 2021-05-11: 200 mg via INTRAVENOUS

## 2021-05-11 MED ORDER — LIDOCAINE 2% (20 MG/ML) 5 ML SYRINGE
INTRAMUSCULAR | Status: AC
  Filled 2021-05-11: qty 5

## 2021-05-11 MED ORDER — LACTATED RINGERS IV SOLN
INTRAVENOUS | Status: DC
  Administered 2021-05-11: 1000 mL via INTRAVENOUS

## 2021-05-11 MED ORDER — OXYCODONE-ACETAMINOPHEN 5-325 MG PO TABS
1.0000 | ORAL_TABLET | ORAL | 0 refills | Status: DC | PRN
  Filled 2021-05-11: qty 15, 3d supply, fill #0

## 2021-05-11 MED ORDER — INSULIN ASPART 100 UNIT/ML IJ SOLN
INTRAMUSCULAR | Status: AC
  Filled 2021-05-11: qty 1

## 2021-05-11 MED ORDER — OXYCODONE HCL 5 MG/5ML PO SOLN
5.0000 mg | Freq: Once | ORAL | Status: AC | PRN
Start: 2021-05-11 — End: 2021-05-11

## 2021-05-11 MED ORDER — BUPIVACAINE-EPINEPHRINE 0.25% -1:200000 IJ SOLN
INTRAMUSCULAR | Status: AC
  Filled 2021-05-11: qty 1

## 2021-05-11 MED ORDER — 0.9 % SODIUM CHLORIDE (POUR BTL) OPTIME
TOPICAL | Status: DC | PRN
  Administered 2021-05-11: 1000 mL

## 2021-05-11 MED ORDER — PROPOFOL 10 MG/ML IV BOLUS
INTRAVENOUS | Status: AC
  Filled 2021-05-11: qty 40

## 2021-05-11 MED ORDER — SCOPOLAMINE 1 MG/3DAYS TD PT72
1.0000 | MEDICATED_PATCH | TRANSDERMAL | Status: DC
  Administered 2021-05-11: 1.5 mg via TRANSDERMAL
  Filled 2021-05-11: qty 1

## 2021-05-11 MED ORDER — SODIUM CHLORIDE 0.9 % IV SOLN
INTRAVENOUS | Status: AC
  Filled 2021-05-11: qty 2

## 2021-05-11 MED ORDER — ORAL CARE MOUTH RINSE: 15.0000 mL | Freq: Once | OROMUCOSAL | Status: DC

## 2021-05-11 MED ORDER — DEXTROSE 5 % IV SOLN
INTRAVENOUS | Status: DC | PRN
  Administered 2021-05-11: 3 g via INTRAVENOUS

## 2021-05-11 MED ORDER — LIDOCAINE 2% (20 MG/ML) 5 ML SYRINGE
INTRAMUSCULAR | Status: DC | PRN
  Administered 2021-05-11: 1.5 mg/kg/h via INTRAVENOUS

## 2021-05-11 MED ORDER — MIDAZOLAM HCL 2 MG/2ML IJ SOLN
INTRAMUSCULAR | Status: DC | PRN
  Administered 2021-05-11: 2 mg via INTRAVENOUS

## 2021-05-11 MED ORDER — MEPERIDINE HCL 50 MG/ML IJ SOLN: 6.2500 mg | INTRAMUSCULAR | Status: DC | PRN

## 2021-05-11 MED ORDER — CHLORHEXIDINE GLUCONATE 0.12 % MT SOLN: 15.0000 mL | Freq: Once | OROMUCOSAL | Status: DC

## 2021-05-11 MED ORDER — MIDAZOLAM HCL 2 MG/2ML IJ SOLN: 0.5000 mg | Freq: Once | INTRAMUSCULAR | Status: DC | PRN

## 2021-05-11 MED ORDER — ROCURONIUM BROMIDE 10 MG/ML (PF) SYRINGE
PREFILLED_SYRINGE | INTRAVENOUS | Status: DC | PRN
  Administered 2021-05-11: 20 mg via INTRAVENOUS
  Administered 2021-05-11: 70 mg via INTRAVENOUS

## 2021-05-11 MED ORDER — SUGAMMADEX SODIUM 500 MG/5ML IV SOLN
INTRAVENOUS | Status: DC | PRN
  Administered 2021-05-11: 260 mg via INTRAVENOUS

## 2021-05-11 MED ORDER — IOHEXOL 300 MG/ML  SOLN
INTRAMUSCULAR | Status: DC | PRN
  Administered 2021-05-11: 30 mL

## 2021-05-11 SURGICAL SUPPLY — 41 items
APPLIER CLIP ROT 10 11.4 M/L (STAPLE) ×2
BAG COUNTER SPONGE SURGICOUNT (BAG) ×4 IMPLANT
CABLE HIGH FREQUENCY MONO STRZ (ELECTRODE) ×2 IMPLANT
CATH URET 5FR 28IN OPEN ENDED (CATHETERS) IMPLANT
CHLORAPREP W/TINT 26 (MISCELLANEOUS) ×2 IMPLANT
CLIP APPLIE ROT 10 11.4 M/L (STAPLE) ×1 IMPLANT
COVER MAYO STAND STRL (DRAPES) ×2 IMPLANT
COVER SURGICAL LIGHT HANDLE (MISCELLANEOUS) ×2 IMPLANT
DECANTER SPIKE VIAL GLASS SM (MISCELLANEOUS) ×2 IMPLANT
DERMABOND ADVANCED (GAUZE/BANDAGES/DRESSINGS) ×1
DERMABOND ADVANCED .7 DNX12 (GAUZE/BANDAGES/DRESSINGS) ×1 IMPLANT
DRAPE C-ARM 42X120 X-RAY (DRAPES) ×2 IMPLANT
ELECT REM PT RETURN 15FT ADLT (MISCELLANEOUS) ×2 IMPLANT
ENDOLOOP SUT PDS II  0 18 (SUTURE) ×1
ENDOLOOP SUT PDS II 0 18 (SUTURE) ×1 IMPLANT
GLOVE SRG 8 PF TXTR STRL LF DI (GLOVE) ×1 IMPLANT
GLOVE SURG ENC MOIS LTX SZ7.5 (GLOVE) ×2 IMPLANT
GLOVE SURG UNDER POLY LF SZ8 (GLOVE) ×1
GOWN STRL REUS W/TWL LRG LVL3 (GOWN DISPOSABLE) ×2 IMPLANT
GOWN STRL REUS W/TWL XL LVL3 (GOWN DISPOSABLE) ×4 IMPLANT
GRASPER SUT TROCAR 14GX15 (MISCELLANEOUS) ×2 IMPLANT
HEMOSTAT SNOW SURGICEL 2X4 (HEMOSTASIS) IMPLANT
IRRIG SUCT STRYKERFLOW 2 WTIP (MISCELLANEOUS) ×2
IRRIGATION SUCT STRKRFLW 2 WTP (MISCELLANEOUS) ×1 IMPLANT
IV CATH 14GX2 1/4 (CATHETERS) ×2 IMPLANT
KIT BASIN OR (CUSTOM PROCEDURE TRAY) ×2 IMPLANT
KIT TURNOVER KIT A (KITS) ×2 IMPLANT
NEEDLE INSUFFLATION 14GA 120MM (NEEDLE) ×2 IMPLANT
PENCIL SMOKE EVACUATOR (MISCELLANEOUS) IMPLANT
POUCH RETRIEVAL ECOSAC 10 (ENDOMECHANICALS) ×1 IMPLANT
POUCH RETRIEVAL ECOSAC 10MM (ENDOMECHANICALS) ×1
SCISSORS LAP 5X35 DISP (ENDOMECHANICALS) ×2 IMPLANT
SET TUBE SMOKE EVAC HIGH FLOW (TUBING) ×2 IMPLANT
SLEEVE XCEL OPT CAN 5 100 (ENDOMECHANICALS) ×4 IMPLANT
STOPCOCK 4 WAY LG BORE MALE ST (IV SETS) IMPLANT
SUT MNCRL AB 4-0 PS2 18 (SUTURE) ×2 IMPLANT
TOWEL OR 17X26 10 PK STRL BLUE (TOWEL DISPOSABLE) ×2 IMPLANT
TOWEL OR NON WOVEN STRL DISP B (DISPOSABLE) IMPLANT
TRAY LAPAROSCOPIC (CUSTOM PROCEDURE TRAY) ×2 IMPLANT
TROCAR BLADELESS OPT 5 100 (ENDOMECHANICALS) ×2 IMPLANT
TROCAR XCEL 12X100 BLDLESS (ENDOMECHANICALS) ×2 IMPLANT

## 2021-05-11 NOTE — H&P (Signed)
Admitting Physician: Nickola Major Areeba Sulser  Service: General surgery  CC: RUQ abdominal pain  Subjective   HPI: Elizabeth [name] is an 45 y.o. female who is here for elective laparoscopic cholecystectomy with intraoperative cholangiogram.  Past Medical History:  Diagnosis Date   Anemia    Arthritis of left knee    Astigmatism    Depression    Diabetes mellitus without complication (HCC)    Dry mouth    Fatigue    GERD (gastroesophageal reflux disease)    Heartburn    High cholesterol    Hypertension    Keratoconjunctivitis    Muscle pain    Vitamin D deficiency    Weakness     Past Surgical History:  Procedure Laterality Date   BIOPSY  01/18/2021   Procedure: BIOPSY;  Surgeon: Lavena Bullion, DO;  Location: [facility];  Service: Gastroenterology;;   DILATION AND CURETTAGE OF UTERUS     ENDOMETRIAL BIOPSY  12/19/2017   normal per pt   ESOPHAGOGASTRODUODENOSCOPY (EGD) WITH PROPOFOL N/A 01/18/2021   Procedure: ESOPHAGOGASTRODUODENOSCOPY (EGD) WITH PROPOFOL;  Surgeon: Lavena Bullion, DO;  Location: [facility];  Service: Gastroenterology;  Laterality: N/A;   TONSILLECTOMY     TUBAL LIGATION      Family History  Problem Relation Age of Onset   Diabetes Mother    Hypertension Mother    Hyperlipidemia Mother    Obesity Mother    Ovarian cancer Maternal Grandmother    HIV Father        died from complications (IVDU)   Alcohol abuse Father    Drug abuse Father    Colon cancer Neg Hx    Esophageal cancer Neg Hx    Liver disease Neg Hx    Pancreatic cancer Neg Hx    Stomach cancer Neg Hx     Social:  reports that she has been smoking cigarettes. She has a 1.50 pack-year smoking history. She has never used smokeless tobacco. She reports that she does not drink alcohol and does not use drugs.  Allergies: No Known Allergies  Medications: Current Outpatient Medications  Medication Instructions   albuterol (VENTOLIN HFA) 108 (90 Base) MCG/ACT inhaler  Inhale 2 puffs by mouth into the lungs every 6 (six) hours as needed for wheezing or shortness of breath.   amLODipine (NORVASC) 10 MG tablet TAKE 1 TABLET BY MOUTH ONCE DAILY   amoxicillin-clavulanate (AUGMENTIN) 875-125 MG tablet 1 tablet, Oral, 2 times daily   COVID-19 mRNA vaccine, Pfizer, 30 MCG/0.3ML injection AS DIRECTED   Dapagliflozin-metFORMIN HCl ER (XIGDUO XR) 02-999 MG TB24 TAKE 2 TABLETS BY MOUTH DAILY BEFORE BREAKFAST.   dicyclomine (BENTYL) 40 mg, Oral, 2 times daily PRN   escitalopram (LEXAPRO) 20 MG tablet TAKE 1 TABLET BY MOUTH ONCE DAILY   etonogestrel (NEXPLANON) 68 MG IMPL implant 1 each, Subdermal,  Once   hydrOXYzine (ATARAX/VISTARIL) 25 MG tablet Take 2 tablets (50 mg total) by mouth at bedtime as needed for insomnia.   lisinopril (ZESTRIL) 40 MG tablet TAKE 1 TABLET BY MOUTH ONCE DAILY   lovastatin (MEVACOR) 40 MG tablet TAKE 1 TABLET (40 MG TOTAL) BY MOUTH AT BEDTIME.   ondansetron (ZOFRAN-ODT) 4 MG disintegrating tablet DISSOLVE 1 TABLET (4 MG TOTAL) BY MOUTH EVERY 8 (EIGHT) HOURS AS NEEDED.   pantoprazole (PROTONIX) 40 mg, Oral, 2 times daily   Trulicity 3 mg, Injection, Weekly    ROS - all of the below systems have been reviewed with the patient and positives are indicated  with bold text General: chills, fever or night sweats Eyes: blurry vision or double vision ENT: epistaxis or sore throat Allergy/Immunology: itchy/watery eyes or nasal congestion Hematologic/Lymphatic: bleeding problems, blood clots or swollen lymph nodes Endocrine: temperature intolerance or unexpected weight changes Breast: new or changing breast lumps or nipple discharge Resp: cough, shortness of breath, or wheezing CV: chest pain or dyspnea on exertion GI: as per HPI GU: dysuria, trouble voiding, or hematuria MSK: joint pain or joint stiffness Neuro: TIA or stroke symptoms Derm: pruritus and skin lesion changes Psych: anxiety and depression  Objective   PE Blood pressure (!)  151/108, pulse (!) 101, temperature 98.4 F (36.9 C), temperature source Oral, resp. rate 18, SpO2 99 %. Constitutional: NAD; conversant; no deformities Eyes: Moist conjunctiva; no lid lag; anicteric; PERRL Neck: Trachea midline; no thyromegaly Lungs: Normal respiratory effort; no tactile fremitus CV: RRR; no palpable thrills; no pitting edema GI: Abd Soft, RUQ pain; no palpable hepatosplenomegaly MSK: Normal range of motion of extremities; no clubbing/cyanosis Psychiatric: Appropriate affect; alert and oriented x3 Lymphatic: No palpable cervical or axillary lymphadenopathy  Results for orders placed or performed during the hospital encounter of 05/11/21 (from the past 24 hour(s))  Pregnancy, urine     Status: None   Collection Time: 05/11/21  5:16 AM  Result Value Ref Range   Preg Test, Ur NEGATIVE NEGATIVE  Glucose, capillary     Status: Abnormal   Collection Time: 05/11/21  5:38 AM  Result Value Ref Range   Glucose-Capillary 197 (H) 70 - 99 mg/dL     Imaging Orders  DG C-Arm 1-60 Min    Assessment and Plan   Elizabeth [name] is a 45 year old female who I have seen multiple times in office for complaints of right upper quadrant abdominal pain.   During our first visit in office on 02/07/21, we discussed that the patient has a normal right upper quadrant ultrasound and a normal HIDA scan as well as a normal CT scan. I don't see any clear surgical cause of her abdominal pain. It does not appear to be her gallbladder. She has had an elevated lipase in the past, we discussed the causes of pancreatitis. She doesn't use any alcohol, and doesn't appear to have any gallstones, so perhaps there is a different medical explanation for her pancreatitis. She is following up with her gastroenterologist in the coming weeks. We discussed there is a chance she may require cholecystectomy in the future, but any other causes of her right upper quadrant abdominal pain should be ruled out by her medical  team's first.   She returned to office on 03/28/21, we discussed how despite a normal workup and thorough evaluation by the gastroenterology and medicine teams, the patient's right upper quadrant abdominal pain continued. Her history seems consistent with biliary colic, with the pain being associated with eating, especially eating fatty foods. At this point, with no clear explanation for her symptoms, I offered to perform a diagnostic laparoscopy with cholecystectomy and intraoperative cholangiogram with the hopes of diagnosing her pain, and possibly resolving it by taking the gallbladder out. I explained the risks, benefits, and alternatives of this procedure. The risks discussed included but were not limited to the risk of infection, bleeding, damage to nearby structures, bile leak, common bile duct injury, and incisional hernia. I expressed my concern that she may have continued abdominal pain despite the cholecystectomy. During surgery I would have the opportunity to look for signs of Fitz-Hugh Curtis syndrome or other  explanations of her symptoms. After full discussion all questions answered the patient granted consent to proceed.   Today she presents for surgery.  We reviewed the risks, benefits and alternatives again in the preoperative area.  After a full discussion and all questions answered the patient granted consent to proceed.  Felicie Morn, MD  [facility] Use [email or web address] to contact on call provider

## 2021-05-11 NOTE — Anesthesia Procedure Notes (Signed)
Procedure Name: Intubation Date/Time: 05/11/2021 8:27 AM Performed by: Raenette Rover, CRNA Pre-anesthesia Checklist: Patient identified, Emergency Drugs available, Suction available and Patient being monitored Patient Re-evaluated:Patient Re-evaluated prior to induction Oxygen Delivery Method: Circle system utilized Preoxygenation: Pre-oxygenation with 100% oxygen Induction Type: IV induction Ventilation: Mask ventilation without difficulty Laryngoscope Size: Mac and 3 Grade View: Grade I Tube type: Oral Tube size: 7.0 mm Number of attempts: 1 Airway Equipment and Method: Stylet Placement Confirmation: ETT inserted through vocal cords under direct vision, positive ETCO2 and breath sounds checked- equal and bilateral Secured at: 21 cm Tube secured with: Tape Dental Injury: Teeth and Oropharynx as per pre-operative assessment

## 2021-05-11 NOTE — Transfer of Care (Signed)
Immediate Anesthesia Transfer of Care Note  Patient: Elizabeth [name]  Procedure(s) Performed: LAPAROSCOPIC CHOLECYSTECTOMY WITH INTRAOPERATIVE CHOLANGIOGRAM  Patient Location: PACU  Anesthesia Type:General  Level of Consciousness: awake, alert , oriented and patient cooperative  Airway & Oxygen Therapy: Patient Spontanous Breathing and Patient connected to face mask oxygen  Post-op Assessment: Report given to RN and Post -op Vital signs reviewed and stable  Post vital signs: Reviewed and stable  Last Vitals:  Vitals Value Taken Time  BP 156/80 05/11/21 1016  Temp 36.7 C 05/11/21 1016  Pulse 111 05/11/21 1022  Resp 26 05/11/21 1022  SpO2 100 % 05/11/21 1022  Vitals shown include unvalidated device data.  Last Pain:  Vitals:   05/11/21 0540  TempSrc: Oral  PainSc:       Patients Stated Pain Goal: 3 ([id number])  Complications: No notable events documented.

## 2021-05-11 NOTE — Op Note (Signed)
Patient: Elizabeth [name] (March 28, 1976, [id number])  Date of Surgery: 05/11/2021   Preoperative Diagnosis: Biliary colic and right upper quadrant abdominal pain  Postoperative Diagnosis: Biliary Colic and right upper quadrant abdominal pain   Surgical Procedure: LAPAROSCOPIC CHOLECYSTECTOMY WITH INTRAOPERATIVE CHOLANGIOGRAM:    Operative Team Members:  Surgeon(s) and Role:    * Caralina Nop, Nickola Major, MD - Primary  [facility] Resident Assistant:  Lucas Mallow, MD  Anesthesiologist: Annye Asa, MD CRNA: Raenette Rover, CRNA   Anesthesia: General   Fluids:  Total I/O In: [id number] [I.V.:1500; IV Piggyback:50] Out: 15 [id number]  Complications: None  Drains:  none   Specimen:  ID Type Source Tests Collected by Time Destination  1 : Gallbladder for pathology Tissue PATH Gallbladder SURGICAL PATHOLOGY Shravya Wickwire, Nickola Major, MD 05/11/2021 0909      Disposition:  PACU - hemodynamically stable.  Plan of Care: Discharge to home after PACU    Indications for Procedure:  Elizabeth [name] is a 45 year old female who I have seen multiple times in office for complaints of right upper quadrant abdominal pain.   During our first visit in office on 02/07/21, we discussed that the patient has a normal right upper quadrant ultrasound and a normal HIDA scan as well as a normal CT scan. I don't see any clear surgical cause of her abdominal pain. It does not appear to be her gallbladder. She has had an elevated lipase in the past, we discussed the causes of pancreatitis. She doesn't use any alcohol, and doesn't appear to have any gallstones, so perhaps there is a different medical explanation for her pancreatitis. She is following up with her gastroenterologist in the coming weeks. We discussed there is a chance she may require cholecystectomy in the future, but any other causes of her right upper quadrant abdominal pain should be ruled out by her medical team's first.     She returned to office on  03/28/21, we discussed how despite a normal workup and thorough evaluation by the gastroenterology and medicine teams, the patient's right upper quadrant abdominal pain continued. Her history seems consistent with biliary colic, with the pain being associated with eating, especially eating fatty foods. At this point, with no clear explanation for her symptoms, I offered to perform a diagnostic laparoscopy with cholecystectomy and intraoperative cholangiogram with the hopes of diagnosing her pain, and possibly resolving it by taking the gallbladder out. I explained the risks, benefits, and alternatives of this procedure. The risks discussed included but were not limited to the risk of infection, bleeding, damage to nearby structures, bile leak, common bile duct injury, and incisional hernia. I expressed my concern that she may have continued abdominal pain despite the cholecystectomy. During surgery I would have the opportunity to look for signs of Fitz-Hugh Curtis syndrome or other explanations of her symptoms. After full discussion all questions answered the patient granted consent to proceed.    Today she presents for surgery.  We reviewed the risks, benefits and alternatives again in the preoperative area.  After a full discussion and all questions answered the patient granted consent to proceed.  Findings: Gallbladder, no palpable gallstones, fatty appearance of the liver    Infection status: Patient: Private Patient Elective Case Case: Elective Infection Present At Time Of Surgery (PATOS): None   Description of Procedure:   On the date stated above, the patient was taken to the operating room suite and placed in supine positioning.  Sequential compression devices were placed on the lower extremities to  prevent blood clots.  General endotracheal anesthesia was induced. Preoperative antibiotics (cefazolin) were given within 30 minutes of incision.  The patient's abdomen was prepped and draped in the  usual sterile fashion.  A time-out was completed verifying the correct patient, procedure, positioning and equipment needed for the case.  We began by anesthetizing the skin with local anesthetic and then making a 5 mm incision just below the umbilicus.  We dissected through the subcutaneous tissues to the fascia.  The fascia was grasped and elevated using a Kocher clamp.  A Veress needle was inserted into the abdomen and the abdomen was insufflated to 15 mmHg.  A 5 mm trocar was inserted in this position under optical guidance and then the abdomen was inspected.  There was no trauma to the underlying viscera with initial trocar placement.  Any abnormal findings, other than inflammation in the right upper quadrant, are listed above in the findings section.  Three additional trocars were placed, one 12 mm trocar in the subxiphoid position, one 5 mm trocar in the midline epigastric area and one 45m trocar in the right upper quadrant subcostally.  These were placed under direct vision without any trauma to the underlying viscera.    The abdomen was inspected.  The liver appeared fatty.  There were no other abnormalities present to explain the patient's right upper quadrant pain so we proceeded with cholecystectomy.  The patient was then placed in head up, left side down positioning.  The gallbladder was identified and dissected free from its attachments to the omentum allowing the duodenum to fall away.  The infundibulum of the gallbladder was dissected free working laterally to medially.  The cystic duct and cystic artery were dissected free from surrounding connective tissue.  The infundibulum of the gallbladder was dissected off the cystic plate.  A critical view of safety was obtained with the cystic duct and cystic artery being cleared of connective tissues and clearly the only two structures entering into the gallbladder with the liver clearly visible behind.  One clip was applied high on the cystic duct.   A small ductotomy was created below this using the endoscopic shears.  A cholangiogram catheter was introduced through the abdominal wall and into the cystic duct through this ductotomy.  The catheter was clipped into position.  The catheter was flushed to ensure no leakage around the clip.  We then removed the laparoscopic instruments and positioned the C-Arm to perform a cholangiogram.  The catheter was flushed with contrast under fluoroscopic visualization and a cholangiogram was obtained.  The cholangiogram visualized the biliary tree from the ampulla up to the first two biliary radicals in the liver.  There were no filling defects identified.  The catheter clearly entered the cystic duct.  There was gradual tapering of the common bile duct down to the ampulla without evidence of stricture or other abnormalities.  Please see the EMR for saved representative images.  With our cholangiogram compete, we moved the c-arm away from the field and returned to laparoscopic surgery.    Clips were then applied to the cystic duct and cystic artery and then these structures were divided.  The gallbladder was dissected off the cystic plate, placed in an endocatch bag and removed from the 12 mm subxiphoid port site.  The clips were inspected and appeared effective.  The cystic plate was inspected and hemostasis was obtained using electrocautery.  A suction irrigator was used to clean the operative field.  Attention was turned to closure.  The 12 mm subxiphoid port site was closed using a 0-vicryl suture on a fascial suture passer.  The abdomen was desufflated.  The skin was closed using 4-0 monocryl and dermabond.  All sponge and needle counts were correct at the conclusion of the case.    Louanna Raw, MD General, Bariatric, & Minimally Invasive Surgery [facility], [place]

## 2021-05-11 NOTE — Anesthesia Postprocedure Evaluation (Signed)
Anesthesia Post Note  Patient: Elizabeth [name]  Procedure(s) Performed: LAPAROSCOPIC CHOLECYSTECTOMY WITH INTRAOPERATIVE CHOLANGIOGRAM     Patient location during evaluation: PACU Anesthesia Type: General Level of consciousness: awake and alert, patient cooperative and oriented Pain management: pain level controlled Vital Signs Assessment: post-procedure vital signs reviewed and stable Respiratory status: spontaneous breathing, nonlabored ventilation and respiratory function stable Cardiovascular status: blood pressure returned to baseline and stable Postop Assessment: no apparent nausea or vomiting Anesthetic complications: no   No notable events documented.  Last Vitals:  Vitals:   05/11/21 1130 05/11/21 1136  BP:  (!) 157/90  Pulse: (!) 119 99  Resp: (!) 9 16  Temp: 36.8 C 36.9 C  SpO2: 97% 97%    Last Pain:  Vitals:   05/11/21 1136  TempSrc:   PainSc: 0-No pain                 Seraj Dunnam,E. Guilianna Mckoy

## 2021-05-11 NOTE — Discharge Instructions (Signed)
 CHOLECYSTECTOMY POST OPERATIVE INSTRUCTIONS  Thinking Clearly  The anesthesia may cause you to feel different for 1 or 2 days. Do not drive, drink alcohol, or make any big decisions for at least 2 days.  Nutrition When you wake up, you will be able to drink small amounts of liquid. If you do not feel sick, you can slowly advance your diet to regular foods. Continue to drink lots of fluids, usually about 8 to 10 glasses per day. Eat a high-fiber diet so you don't strain during bowel movements. High-Fiber Foods Foods high in fiber include beans, bran cereals and whole-grain breads, peas, dried fruit (figs, apricots, and dates), raspberries, blackberries, strawberries, sweet corn, broccoli, baked potatoes with skin, plums, pears, apples, greens, and nuts. Activity Slowly increase your activity. Be sure to get up and walk every hour or so to prevent blood clots. No heavy lifting or strenuous activity for 4 weeks following surgery to prevent hernias at your incision sites It is normal to feel tired. You may need more sleep than usual.  Get your rest but make sure to get up and move around frequently to prevent blood clots and pneumonia.  Work and Return to School You can go back to work when you feel well enough. Discuss the timing with your surgeon. You can usually go back to school or work 1 week after an operation. If your work requires heavy lifting or strenuous activity you need to be placed on light duty for 4 weeks following surgery. You can return to gym class, sports or other physical activities 4 weeks after surgery.  Wound Care Always wash your hands before and after touching near your incision site. Do not soak in a bathtub until cleared at your follow up appointment. You may take a shower 24 hours after surgery. A small amount of drainage from the incision is normal. If the drainage is thick and yellow or the site is red, you may have an infection, so call your surgeon. If you  have a drain in one of your incisions, it will be taken out in office when the drainage stops. Steri-Strips will fall off in 7 to 10 days or they will be removed during your first office visit. If you have dermabond glue covering over the incision, allow the glue to flake off on its own. Avoid wearing tight or rough clothing. It may rub your incisions and make it harder for them to heal. Protect the new skin, especially from the sun. The sun can burn and cause darker scarring. Your scar will heal in about 4 to 6 weeks and will become softer and continue to fade over the next year.  The cosmetic appearance of the incisions will improve over the course of the first year after surgery. Sensation around your incision will return in a few weeks or months.  Bowel Movements After intestinal surgery, you may have loose watery stools for several days. If watery diarrhea lasts longer than 3 days, contact your surgeon. Pain medication (narcotics) can cause constipation. Increase the fiber in your diet with high-fiber foods if you are constipated. You can take an over the counter stool softener like Colace to avoid constipation.  Additional over the counter medications can also be used if Colace isn't sufficient (for example, Milk of Magnesia or Miralax).  Pain The amount of pain is different for each person. Some people need only 1 to 3 doses of pain control medication, while others need more. Take alternating doses of tylenol   and ibuprofen around the clock for the first five days following surgery.  This will provide a baseline of pain control and help with inflammation.  Take the narcotic pain medication in addition if needed for severe pain.  Contact Your Surgeon at [phone], if you have: Pain in your right upper abdomen like a gallbladder attack. Pain that will not go away Pain that gets worse A fever of more than 101F (38.3C) Repeated vomiting Swelling, redness, bleeding, or bad-smelling  drainage from your wound site Strong abdominal pain No bowel movement or unable to pass gas for 3 days Watery diarrhea lasting longer than 3 days  Pain Control The goal of pain control is to minimize pain, keep you moving and help you heal. Your surgical team will work with you on your pain plan. Most often a combination of therapies and medications are used to control your pain. You may also be given medication (local anesthetic) at the surgical site. This may help control your pain for several days. Extreme pain puts extra stress on your body at a time when your body needs to focus on healing. Do not wait until your pain has reached a level "10" or is unbearable before telling your doctor or nurse. It is much easier to control pain before it becomes severe. Following a laparoscopic procedure, pain is sometimes felt in the shoulder. This is due to the gas inserted into your abdomen during the procedure. Moving and walking helps to decrease the gas and the right shoulder pain.  Use the guide below for ways to manage your post-operative pain. Learn more by going to facs.org/safepaincontrol.  How Intense Is My Pain Common Therapies to Feel Better       I hardly notice my pain, and it does not interfere with my activities.  I notice my pain and it distracts me, but I can still do activities (sitting up, walking, standing).  Non-Medication Therapies  Ice (in a bag, applied over clothing at the surgical site), elevation, rest, meditation, massage, distraction (music, TV, play) walking and mild exercise Splinting the abdomen with pillows +  Non-Opioid Medications Acetaminophen (Tylenol) Non-steroidal anti-inflammatory drugs (NSAIDS) Aspirin, Ibuprofen (Motrin, Advil) Naproxen (Aleve) Take these as needed, when you feel pain. Both acetaminophen and NSAIDs help to decrease pain and swelling (inflammation).      My pain is hard to ignore and is more noticeable even when I rest.  My  pain interferes with my usual activities.  Non-Medication Therapies  +  Non-Opioid medications  Take on a regular schedule (around-the-clock) instead of as needed. (For example, Tylenol every 6 hours at 9:00 am, 3:00 pm, 9:00 pm, 3:00 am and Motrin every 6 hours at 12:00 am, 6:00 am, 12:00 pm, 6:00 pm)         I am focused on my pain, and I am not doing my daily activities.  I am groaning in pain, and I cannot sleep. I am unable to do anything.  My pain is as bad as it could be, and nothing else matters.  Non-Medication Therapies  +  Around-the-Clock Non-Opioid Medications  +  Short-acting opioids  Opioids should be used with other medications to manage severe pain. Opioids block pain and give a feeling of euphoria (feel high). Addiction, a serious side effect of opioids, is rare with short-term (a few days) use.  Examples of short-acting opioids include: Tramadol (Ultram), Hydrocodone (Norco, Vicodin), Hydromorphone (Dilaudid), Oxycodone (Oxycontin)     The above directions have been adapted from   the [employer] Surgical Patient Education Program.  Please refer to the ACS website if needed: https://[email or web address].org/-/media/files/education/[email or web address].[email or web address].   Stevan Eberwein, MD [facility] [address], [address], [place], [place]  [address] ?  [address] [address], [place], [place]   [address] [phone] ? [phone] ? FAX [phone] Web site: [email or web address]  

## 2021-05-13 ENCOUNTER — Other Ambulatory Visit: Payer: Self-pay

## 2021-05-14 ENCOUNTER — Encounter (HOSPITAL_COMMUNITY): Payer: Self-pay | Admitting: Surgery

## 2021-05-14 ENCOUNTER — Other Ambulatory Visit (HOSPITAL_BASED_OUTPATIENT_CLINIC_OR_DEPARTMENT_OTHER): Payer: Self-pay

## 2021-05-14 LAB — SURGICAL PATHOLOGY

## 2021-05-14 MED ORDER — OXYCODONE-ACETAMINOPHEN 5-325 MG PO TABS
ORAL_TABLET | ORAL | 0 refills | Status: DC
  Filled 2021-05-14: qty 20, 5d supply, fill #0

## 2021-05-15 ENCOUNTER — Other Ambulatory Visit (HOSPITAL_BASED_OUTPATIENT_CLINIC_OR_DEPARTMENT_OTHER): Payer: Self-pay

## 2021-05-21 ENCOUNTER — Encounter: Payer: Self-pay | Admitting: Family

## 2021-05-25 ENCOUNTER — Other Ambulatory Visit (HOSPITAL_BASED_OUTPATIENT_CLINIC_OR_DEPARTMENT_OTHER): Payer: Self-pay

## 2021-05-31 ENCOUNTER — Other Ambulatory Visit (HOSPITAL_BASED_OUTPATIENT_CLINIC_OR_DEPARTMENT_OTHER): Payer: Self-pay

## 2021-06-04 ENCOUNTER — Other Ambulatory Visit (HOSPITAL_BASED_OUTPATIENT_CLINIC_OR_DEPARTMENT_OTHER): Payer: Self-pay

## 2021-06-06 NOTE — Telephone Encounter (Signed)
Forms faxed in  Placed into osullivan bin up front

## 2021-06-15 ENCOUNTER — Other Ambulatory Visit (HOSPITAL_BASED_OUTPATIENT_CLINIC_OR_DEPARTMENT_OTHER): Payer: Self-pay

## 2021-06-15 MED ORDER — CHOLESTYRAMINE 4 G PO PACK
PACK | ORAL | 0 refills | Status: DC
  Filled 2021-06-15: qty 60, 30d supply, fill #0

## 2021-06-17 ENCOUNTER — Other Ambulatory Visit: Payer: Self-pay | Admitting: Family

## 2021-06-18 ENCOUNTER — Other Ambulatory Visit (HOSPITAL_BASED_OUTPATIENT_CLINIC_OR_DEPARTMENT_OTHER): Payer: Self-pay | Admitting: Family

## 2021-06-18 ENCOUNTER — Other Ambulatory Visit (HOSPITAL_BASED_OUTPATIENT_CLINIC_OR_DEPARTMENT_OTHER): Payer: Self-pay

## 2021-06-18 DIAGNOSIS — Z1231 Encounter for screening mammogram for malignant neoplasm of breast: Secondary | ICD-10-CM

## 2021-06-18 MED ORDER — LISINOPRIL 40 MG PO TABS
ORAL_TABLET | Freq: Every day | ORAL | 0 refills | Status: DC
  Filled 2021-06-18: qty 90, 90d supply, fill #0

## 2021-06-21 ENCOUNTER — Ambulatory Visit (INDEPENDENT_AMBULATORY_CARE_PROVIDER_SITE_OTHER): Payer: No Typology Code available for payment source

## 2021-06-21 ENCOUNTER — Other Ambulatory Visit: Payer: Self-pay

## 2021-06-21 DIAGNOSIS — Z1231 Encounter for screening mammogram for malignant neoplasm of breast: Secondary | ICD-10-CM

## 2021-06-26 ENCOUNTER — Other Ambulatory Visit (HOSPITAL_BASED_OUTPATIENT_CLINIC_OR_DEPARTMENT_OTHER): Payer: Self-pay

## 2021-06-26 ENCOUNTER — Other Ambulatory Visit: Payer: Self-pay

## 2021-06-26 ENCOUNTER — Encounter: Payer: Self-pay | Admitting: Gastroenterology

## 2021-06-26 ENCOUNTER — Ambulatory Visit (INDEPENDENT_AMBULATORY_CARE_PROVIDER_SITE_OTHER): Payer: No Typology Code available for payment source | Admitting: Gastroenterology

## 2021-06-26 VITALS — BP 122/82 | HR 94 | Ht 62.0 in | Wt 293.0 lb

## 2021-06-26 DIAGNOSIS — K219 Gastro-esophageal reflux disease without esophagitis: Secondary | ICD-10-CM | POA: Diagnosis not present

## 2021-06-26 DIAGNOSIS — K449 Diaphragmatic hernia without obstruction or gangrene: Secondary | ICD-10-CM

## 2021-06-26 DIAGNOSIS — K30 Functional dyspepsia: Secondary | ICD-10-CM

## 2021-06-26 MED ORDER — FAMOTIDINE 20 MG PO TABS
20.0000 mg | ORAL_TABLET | Freq: Every day | ORAL | 3 refills | Status: DC
  Filled 2021-06-26: qty 90, 90d supply, fill #0
  Filled 2021-10-07: qty 90, 90d supply, fill #1
  Filled 2022-01-15: qty 90, 90d supply, fill #2
  Filled 2022-04-26: qty 90, 90d supply, fill #3

## 2021-06-26 NOTE — Progress Notes (Signed)
Chief Complaint:    GERD  GI History: 45 y.o. female with a history of diabetes, HTN, obesity (BMI 60), hyperlipidemia, GERD, depression initially seen in the GI clinic on 01/07/2021 for evaluation of reflux symptoms, nausea, RUQ/MEG pain.  Evaluation to date as follows:   -12/13/2020: Evaluation in Primary Care Clinic: worsening chronic reflux sxs (heartburn, regurgitation), along with new onset nausea, and MEG pain. No dysphagia. Mildly elevated lipase (69), otherwise unremarkable CBC and CMP.  Negative H. pylori antibody. Improvement with GI cocktail in office. Increased omeprazole to 40 mg/day and added Pepcid 20 mg/day with improvement in reflux sxs.   -12/20/2020: ER eval for ongoing MEG pain, nausea. Eval unremarkable, to include normalized lipase, normal CBC (chronic microcytosis without anemia), CMP unremarkable. Started carafate QID and Zofran.     -12/27/2020: Follow-up with PCM:  Pain was improving, and only mild improvement in nausea.  Repeat lipase was normal, with normal CBC. - 12/28/2020: Abdominal ultrasound: hepatic steatosis - 01/09/2021: GI clinic evaluation for nausea, reflux, RUQ/MEG pain along with separate c/o constipation with pellet-like stools.  Some improvement with MiraLAX.  Started after Rx for Percocet from ER on 12/20/2020 - 01/15/2021: HIDA scan: Normal -01/18/2021: EGD: 3 cm HH, mild reflux changes, otherwise unremarkable with normal gastric/duodenal biopsies - 01/31/2021: CT abdomen/pelvis: Hepatic steatosis, otherwise unremarkable.  Normal liver enzymes and lipase - 02/07/2021: Was seen at [facility] and initially did not recommend ccy - 02/13/2021: Follow-up with PCM for ongoing RUQ pain.  Prescribed Bentyl and sent for second opinion with general surgery per patient request. - 05/11/2021: Lap cholecystectomy  HPI:     Patient is a 45 y.o. female presenting to the Gastroenterology Clinic for follow-up.  Last seen by me on 03/14/2021.  Reflux symptoms were relatively well  controlled with Protonix at that time.  Today, she states her reflux symptoms have worsened despite continued Protonix 40 mg bid.  Describes as breakthrough heartburn and indigestion, worse during the late morning. Taking Rx pre lunch and dinner. No nocturnal sxs. No dysphagia.   Underwent laparoscopic cholecystectomy on 05/11/2021 and reports significant improvement in upper abdominal symptoms. Did have post op diarrhea; started on Questran with improvement.   Review of systems:     No chest pain, no SOB, no fevers, no urinary sx   Past Medical History:  Diagnosis Date   Anemia    Arthritis of left knee    Astigmatism    Depression    Diabetes mellitus without complication (HCC)    Dry mouth    Fatigue    GERD (gastroesophageal reflux disease)    Heartburn    High cholesterol    Hypertension    Keratoconjunctivitis    Muscle pain    Vitamin D deficiency    Weakness     Patient's surgical history, family medical history, social history, medications and allergies were all reviewed in Epic    Current Outpatient Medications  Medication Sig Dispense Refill   albuterol (VENTOLIN HFA) 108 (90 Base) MCG/ACT inhaler Inhale 2 puffs by mouth into the lungs every 6 (six) hours as needed for wheezing or shortness of breath. 8.5 g 0   amLODipine (NORVASC) 10 MG tablet TAKE 1 TABLET BY MOUTH ONCE DAILY (Patient taking differently: Take 10 mg by mouth daily.) 90 tablet 0   cholestyramine (QUESTRAN) 4 g packet Mix 1 packet in 60-180 ml of water, milk, or juice and drink twice a day before meals for 30 days 60 packet 0   Dapagliflozin-metFORMIN HCl  ER (XIGDUO XR) 02-999 MG TB24 TAKE 2 TABLETS BY MOUTH DAILY BEFORE BREAKFAST. (Patient taking differently: Take 2 tablets by mouth daily.) 180 tablet 1   dicyclomine (BENTYL) 20 MG tablet Take 2 tablets (40 mg total) by mouth 2 (two) times daily as needed for spasms. 120 tablet 2   Dulaglutide (TRULICITY) 3 0000000 SOPN Inject 3 mg as directed once a  week. 2 mL 5   etonogestrel (NEXPLANON) 68 MG IMPL implant 1 each by Subdermal route once.     hydrOXYzine (ATARAX/VISTARIL) 25 MG tablet Take 2 tablets (50 mg total) by mouth at bedtime as needed for insomnia. (Patient taking differently: Take 50 mg by mouth at bedtime.) 60 tablet 3   lisinopril (ZESTRIL) 40 MG tablet TAKE 1 TABLET BY MOUTH ONCE DAILY 90 tablet 0   lovastatin (MEVACOR) 40 MG tablet TAKE 1 TABLET (40 MG TOTAL) BY MOUTH AT BEDTIME. (Patient taking differently: Take 40 mg by mouth at bedtime.) 90 tablet 1   pantoprazole (PROTONIX) 40 MG tablet Take 1 tablet (40 mg total) by mouth 2 (two) times daily. 180 tablet 5   No current facility-administered medications for this visit.    Physical Exam:     BP 122/82   Pulse 94   Ht '5\' 2"'$  (1.575 m)   Wt 293 lb (132.9 kg)   SpO2 98%   BMI 53.59 kg/m   GENERAL:  Pleasant female in NAD PSYCH: : Cooperative, normal affect SKIN:  turgor, no lesions seen Musculoskeletal:  Normal muscle tone, normal strength NEURO: Alert and oriented x 3, no focal neurologic deficits   IMPRESSION and PLAN:    1) GERD 2) Hiatal hernia 3) Indigestion - Change Protonix timing; take 30-60 minutes before breakfast and 30-60 minutes before dinnertime - Add Pepcid 20 mg to take prelunchtime - Continue antireflux lifestyle/dietary modifications - Continue avoidance of exacerbating foods - Depending on response to therapy, did discuss possible change in PPI to Aciphex or Dexilant if needed in the future - Trial probiotics x4 weeks  4) Obesity - As above, reflux exacerbated by morbid obesity.  Has made some dietary modifications recently  RTC prn    Lavena Bullion ,DO, FACG 06/26/2021, 9:09 AM

## 2021-06-26 NOTE — Patient Instructions (Addendum)
If you are age 43 or older, your body mass index should be between 23-30. Your Body mass index is 53.59 kg/m. If this is out of the aforementioned range listed, please consider follow up with your Primary Care Provider.  If you are age 64 or younger, your body mass index should be between 19-25. Your Body mass index is 53.59 kg/m. If this is out of the aformentioned range listed, please consider follow up with your Primary Care Provider.   __________________________________________________________  The [facility] providers would like to encourage you to use [facility] to communicate with providers for non-urgent requests or questions.  Due to long hold times on the telephone, sending your provider a message by [facility] may be a faster and more efficient way to get a response.  Please allow 48 business hours for a response.  Please remember that this is for non-urgent requests.   Please take Protonix 30-6 minutes before breakfast and dinner.  Please take Pepcid 30-60 minutes before lunch  Please purchase the following medications over the counter and take as directed: Probiotic for 4 weeks.  Please call with any questions or concerns.  It was a pleasure to see you today!  Vito Cirigliano, D.O.

## 2021-06-30 ENCOUNTER — Other Ambulatory Visit: Payer: Self-pay | Admitting: Family

## 2021-07-03 ENCOUNTER — Other Ambulatory Visit (HOSPITAL_BASED_OUTPATIENT_CLINIC_OR_DEPARTMENT_OTHER): Payer: Self-pay

## 2021-07-03 MED ORDER — AMLODIPINE BESYLATE 10 MG PO TABS
ORAL_TABLET | Freq: Every day | ORAL | 0 refills | Status: DC
  Filled 2021-07-03: qty 90, 90d supply, fill #0

## 2021-07-06 ENCOUNTER — Ambulatory Visit: Payer: No Typology Code available for payment source | Admitting: Family

## 2021-07-13 ENCOUNTER — Other Ambulatory Visit (HOSPITAL_BASED_OUTPATIENT_CLINIC_OR_DEPARTMENT_OTHER): Payer: Self-pay

## 2021-07-13 ENCOUNTER — Ambulatory Visit: Payer: No Typology Code available for payment source | Admitting: Family

## 2021-07-16 ENCOUNTER — Other Ambulatory Visit (HOSPITAL_BASED_OUTPATIENT_CLINIC_OR_DEPARTMENT_OTHER): Payer: Self-pay

## 2021-07-16 ENCOUNTER — Other Ambulatory Visit: Payer: Self-pay | Admitting: Family

## 2021-07-16 MED ORDER — LOVASTATIN 40 MG PO TABS
ORAL_TABLET | Freq: Every day | ORAL | 1 refills | Status: DC
Start: 2021-07-16 — End: 2022-03-04
  Filled 2021-07-16: qty 90, 90d supply, fill #0
  Filled 2021-12-05: qty 90, 90d supply, fill #1

## 2021-07-20 ENCOUNTER — Emergency Department (HOSPITAL_BASED_OUTPATIENT_CLINIC_OR_DEPARTMENT_OTHER)
Admission: EM | Admit: 2021-07-20 | Discharge: 2021-07-20 | Disposition: A | Discharge status: Home / Self Care | Payer: No Typology Code available for payment source | Attending: Emergency Medicine | Admitting: Emergency Medicine

## 2021-07-20 ENCOUNTER — Other Ambulatory Visit: Payer: Self-pay

## 2021-07-20 ENCOUNTER — Emergency Department (HOSPITAL_BASED_OUTPATIENT_CLINIC_OR_DEPARTMENT_OTHER): Payer: No Typology Code available for payment source

## 2021-07-20 ENCOUNTER — Encounter (HOSPITAL_BASED_OUTPATIENT_CLINIC_OR_DEPARTMENT_OTHER): Payer: Self-pay | Admitting: *Deleted

## 2021-07-20 DIAGNOSIS — R1010 Upper abdominal pain, unspecified: Secondary | ICD-10-CM | POA: Diagnosis not present

## 2021-07-20 DIAGNOSIS — E119 Type 2 diabetes mellitus without complications: Secondary | ICD-10-CM | POA: Insufficient documentation

## 2021-07-20 DIAGNOSIS — F17210 Nicotine dependence, cigarettes, uncomplicated: Secondary | ICD-10-CM | POA: Diagnosis not present

## 2021-07-20 DIAGNOSIS — I10 Essential (primary) hypertension: Secondary | ICD-10-CM | POA: Diagnosis not present

## 2021-07-20 LAB — COMPREHENSIVE METABOLIC PANEL
ALT: 43 U/L (ref 0–44)
AST: 44 U/L — ABNORMAL HIGH (ref 15–41)
Albumin: 3.8 g/dL (ref 3.5–5.0)
Alkaline Phosphatase: 87 U/L (ref 38–126)
Anion gap: 12 (ref 5–15)
BUN: 8 mg/dL (ref 6–20)
CO2: 20 mmol/L — ABNORMAL LOW (ref 22–32)
Calcium: 9.1 mg/dL (ref 8.9–10.3)
Chloride: 102 mmol/L (ref 98–111)
Creatinine, Ser: 0.72 mg/dL (ref 0.44–1.00)
GFR, Estimated: >60 mL/min (ref >60)
Glucose, Bld: 144 mg/dL — ABNORMAL HIGH (ref 70–99)
Potassium: 3.9 mmol/L (ref 3.5–5.1)
Sodium: 134 mmol/L — ABNORMAL LOW (ref 135–145)
Total Bilirubin: 0.3 mg/dL (ref 0.3–1.2)
Total Protein: 7.6 g/dL (ref 6.5–8.1)

## 2021-07-20 LAB — CBC WITH DIFFERENTIAL/PLATELET
Abs Immature Granulocytes: 0.04 10*3/uL (ref 0.00–0.07)
Basophils Absolute: 0.1 10*3/uL (ref 0.0–0.1)
Basophils Relative: 1 %
Eosinophils Absolute: 0 10*3/uL (ref 0.0–0.5)
Eosinophils Relative: 0 %
HCT: 42.1 % (ref 36.0–46.0)
Hemoglobin: 13.6 g/dL (ref 12.0–15.0)
Immature Granulocytes: 0 %
Lymphocytes Relative: 23 %
Lymphs Abs: 2.1 10*3/uL (ref 0.7–4.0)
MCH: 23.5 pg — ABNORMAL LOW (ref 26.0–34.0)
MCHC: 32.3 g/dL (ref 30.0–36.0)
MCV: 72.8 fL — ABNORMAL LOW (ref 80.0–100.0)
Monocytes Absolute: 0.6 10*3/uL (ref 0.1–1.0)
Monocytes Relative: 6 %
Neutro Abs: 6.6 10*3/uL (ref 1.7–7.7)
Neutrophils Relative %: 70 %
Platelets: 295 10*3/uL (ref 150–400)
RBC: 5.78 MIL/uL — ABNORMAL HIGH (ref 3.87–5.11)
RDW: 15.1 % (ref 11.5–15.5)
WBC: 9.4 10*3/uL (ref 4.0–10.5)
nRBC: 0 % (ref 0.0–0.2)

## 2021-07-20 LAB — URINALYSIS, ROUTINE W REFLEX MICROSCOPIC
Bilirubin Urine: NEGATIVE
Glucose, UA: >=500 mg/dL — AB
Hgb urine dipstick: NEGATIVE
Ketones, ur: 80 mg/dL — AB
Leukocytes,Ua: NEGATIVE
Nitrite: NEGATIVE
Protein, ur: NEGATIVE mg/dL
Specific Gravity, Urine: 1.02 (ref 1.005–1.030)
pH: 6 (ref 5.0–8.0)

## 2021-07-20 LAB — LIPASE, BLOOD: Lipase: 38 U/L (ref 11–51)

## 2021-07-20 LAB — PREGNANCY, URINE: Preg Test, Ur: NEGATIVE

## 2021-07-20 LAB — URINALYSIS, MICROSCOPIC (REFLEX)
RBC / HPF: NONE SEEN RBC/hpf (ref 0–5)
WBC, UA: NONE SEEN WBC/hpf (ref 0–5)

## 2021-07-20 MED ORDER — MORPHINE SULFATE (PF) 4 MG/ML IV SOLN
4.0000 mg | Freq: Once | INTRAVENOUS | Status: AC
  Administered 2021-07-20: 4 mg via INTRAVENOUS
  Filled 2021-07-20: qty 1

## 2021-07-20 MED ORDER — IOHEXOL 350 MG/ML SOLN
100.0000 mL | Freq: Once | INTRAVENOUS | Status: AC | PRN
  Administered 2021-07-20: 100 mL via INTRAVENOUS

## 2021-07-20 MED ORDER — OXYCODONE-ACETAMINOPHEN 5-325 MG PO TABS: 1.0000 | ORAL_TABLET | Freq: Four times a day (QID) | ORAL | 0 refills | Status: AC | PRN

## 2021-07-20 MED ORDER — ONDANSETRON HCL 4 MG/2ML IJ SOLN
4.0000 mg | Freq: Once | INTRAMUSCULAR | Status: AC
  Administered 2021-07-20: 4 mg via INTRAVENOUS
  Filled 2021-07-20: qty 2

## 2021-07-20 NOTE — ED Triage Notes (Signed)
Abdominal and back pain since this am. Her MD is aware of the pain. He gave her Famotine. She feels bloated.

## 2021-07-20 NOTE — ED Provider Notes (Signed)
[facility] EMERGENCY DEPARTMENT Provider Note   CSN: [id number] Arrival date & time: 07/20/21  1713     History Chief Complaint  Patient presents with   Abdominal Pain   Back Pain    Elizabeth [name] is a 45 y.o. female.  With past medical history of GERD, diabetes, hiatal hernia, cholecystectomy in July complicated by dumping syndrome who presents emergency department abdominal pain.  States that the abdominal pain started today around 1 PM.  Sudden onset and sharp.  States that the abdominal pain is epigastric, right upper quadrant, left upper quadrant and radiates to her back.  Endorses nausea without vomiting.  No inciting event to abdominal pain.  Nothing makes the pain better or worse.  She takes famotidine, pantoprazole without relief of symptoms.  States that she had a brief episode of similar pain at the beginning of September but pain abated until today when it has become constant.  She was seen on 06/26/2021 by Dr. Bryan Lemma with [facility].  12/28/20 had abdominal ultrasound which showed hepatic steatosis.  01/15/21 HIDA scan which was normal.  01/18/2021 had EGD with 3 cm hiatal hernia with mild reflux changes.  05/11/21 lap cholecystectomy with Nelson surgery.    Abdominal Pain Associated symptoms: nausea   Associated symptoms: no chest pain, no diarrhea, no dysuria, no fever, no shortness of breath, no vaginal discharge and no vomiting   Back Pain Associated symptoms: abdominal pain   Associated symptoms: no chest pain, no dysuria and no fever       Past Medical History:  Diagnosis Date   Anemia    Arthritis of left knee    Astigmatism    Depression    Diabetes mellitus without complication (HCC)    Dry mouth    Fatigue    GERD (gastroesophageal reflux disease)    Heartburn    High cholesterol    Hypertension    Keratoconjunctivitis    Muscle pain    Vitamin D deficiency    Weakness     Patient Active Problem List   Diagnosis Date  Noted   Chronic abdominal pain 04/06/2021   Grief reaction 04/06/2021   Gastroesophageal reflux disease without esophagitis    RUQ pain    Abdominal pain, epigastric    Hyperlipidemia 01/21/2018   Uncontrolled type 2 diabetes mellitus with hyperglycemia ([place]) 01/21/2018   Keratoconjunctivitis sicca of both eyes not specified as Sjogren's 04/04/2017   Regular astigmatism of both eyes 04/04/2017   Vitamin D deficiency 12/02/2016   Essential hypertension 02/07/2016   Morbid obesity with BMI of 50.0-59.9, adult ([place]) 02/07/2016   Pure hypercholesterolemia 02/07/2016   Type 2 diabetes mellitus with hyperglycemia, without long-term current use of insulin ([place]) 02/07/2016    Past Surgical History:  Procedure Laterality Date   BIOPSY  01/18/2021   Procedure: BIOPSY;  Surgeon: Lavena Bullion, DO;  Location: [facility];  Service: Gastroenterology;;   CHOLECYSTECTOMY N/A 05/11/2021   Procedure: LAPAROSCOPIC CHOLECYSTECTOMY WITH INTRAOPERATIVE CHOLANGIOGRAM;  Surgeon: Felicie Morn, MD;  Location: [facility];  Service: General;  Laterality: N/A;   DILATION AND CURETTAGE OF UTERUS     ENDOMETRIAL BIOPSY  12/19/2017   normal per pt   ESOPHAGOGASTRODUODENOSCOPY (EGD) WITH PROPOFOL N/A 01/18/2021   Procedure: ESOPHAGOGASTRODUODENOSCOPY (EGD) WITH PROPOFOL;  Surgeon: Lavena Bullion, DO;  Location: [facility];  Service: Gastroenterology;  Laterality: N/A;   TONSILLECTOMY     TUBAL LIGATION       OB History  Gravida  2   Para  2   Term  2   Preterm  0   AB  0   Living  2      SAB  0   IAB  0   Ectopic  0   Multiple  0   Live Births              Family History  Problem Relation Age of Onset   Diabetes Mother    Hypertension Mother    Hyperlipidemia Mother    Obesity Mother    Ovarian cancer Maternal Grandmother    HIV Father        died from complications ([facility])   Alcohol abuse Father    Drug abuse Father    Colon cancer Neg Hx    Esophageal  cancer Neg Hx    Liver disease Neg Hx    Pancreatic cancer Neg Hx    Stomach cancer Neg Hx     Social History   Tobacco Use   Smoking status: Every Day    Packs/day: 0.50    Years: 3.00    Pack years: 1.50    Types: Cigarettes   Smokeless tobacco: Never   Tobacco comments:    4-5/day  Vaping Use   Vaping Use: Never used  Substance Use Topics   Alcohol use: No   Drug use: No    Home Medications Prior to Admission medications   Medication Sig Start Date End Date Taking? Authorizing Provider  amLODipine (NORVASC) 10 MG tablet TAKE 1 TABLET BY MOUTH ONCE DAILY 07/03/21 07/03/22 Yes [name], NP  cholestyramine (QUESTRAN) 4 g packet Mix 1 packet in 60-180 ml of water, milk, or juice and drink twice a day before meals for 30 days 06/15/21  Yes   Dapagliflozin-metFORMIN HCl ER (XIGDUO XR) 02-999 MG TB24 TAKE 2 TABLETS BY MOUTH DAILY BEFORE BREAKFAST. Patient taking differently: Take 2 tablets by mouth daily. 03/09/21 03/09/22 Yes   dicyclomine (BENTYL) 20 MG tablet Take 2 tablets (40 mg total) by mouth 2 (two) times daily as needed for spasms. 03/12/21  Yes [name], NP  Dulaglutide (TRULICITY) 3 IO/9.7DZ SOPN Inject 3 mg as directed once a week. 04/06/21  Yes [name], NP  famotidine (PEPCID) 20 MG tablet Take 1 tablet (20 mg total) by mouth daily. 06/26/21  Yes Cirigliano, Vito V, DO  hydrOXYzine (ATARAX/VISTARIL) 25 MG tablet Take 2 tablets (50 mg total) by mouth at bedtime as needed for insomnia. Patient taking differently: Take 50 mg by mouth at bedtime. 04/20/21 04/20/22 Yes [name], NP  lisinopril (ZESTRIL) 40 MG tablet TAKE 1 TABLET BY MOUTH ONCE DAILY 06/18/21 06/18/22 Yes [name], NP  lovastatin (MEVACOR) 40 MG tablet TAKE 1 TABLET (40 MG TOTAL) BY MOUTH AT BEDTIME. 07/16/21 07/16/22 Yes [name], NP  pantoprazole (PROTONIX) 40 MG tablet Take 1 tablet (40 mg total) by mouth 2 (two) times daily. 03/01/21  Yes Cirigliano, Vito  V, DO  albuterol (VENTOLIN HFA) 108 (90 Base) MCG/ACT inhaler Inhale 2 puffs by mouth into the lungs every 6 (six) hours as needed for wheezing or shortness of breath. 05/02/21   [name] A, FNP  etonogestrel (NEXPLANON) 68 MG IMPL implant 1 each by Subdermal route once.    [provider]  sucralfate (CARAFATE) 1 g tablet Take 1 tablet (1 g total) by mouth 4 (four) times daily -  with meals and at bedtime. 12/20/20 01/09/21  Merryl Hacker, MD  traZODone (  DESYREL) 50 MG tablet 1/2 to 1 tab by mouth at bedtime as needed for sleep 09/04/20 11/20/20  [name], NP    Allergies    Patient has no known allergies.  Review of Systems   Review of Systems  Constitutional:  Negative for diaphoresis and fever.  Respiratory:  Negative for shortness of breath.   Cardiovascular:  Negative for chest pain.  Gastrointestinal:  Positive for abdominal pain and nausea. Negative for blood in stool, diarrhea and vomiting.  Genitourinary:  Negative for dysuria and vaginal discharge.  Musculoskeletal:  Positive for back pain.  Neurological:  Positive for light-headedness. Negative for dizziness and syncope.  All other systems reviewed and are negative.  Physical Exam Updated Vital Signs BP (!) 112/94 (BP Location: Left Arm)   Pulse (!) 109   Temp 98.5 F (36.9 C) (Oral)   Resp 20   Ht 5\' 2"  (1.575 m)   Wt 132.9 kg   SpO2 100%   BMI 53.59 kg/m   Physical Exam Vitals and nursing note reviewed.  Constitutional:      Appearance: Normal appearance. She is well-developed. She is obese. She is ill-appearing.  HENT:     Head: Normocephalic and atraumatic.     Mouth/Throat:     Pharynx: Oropharynx is clear.  Eyes:     General: No scleral icterus.    Extraocular Movements: Extraocular movements intact.     Pupils: Pupils are equal, round, and reactive to light.  Cardiovascular:     Rate and Rhythm: Regular rhythm. Tachycardia present.     Heart sounds: Normal heart sounds. No  murmur heard. Pulmonary:     Effort: Pulmonary effort is normal. No respiratory distress.     Breath sounds: Normal breath sounds.  Abdominal:     General: Abdomen is protuberant. A surgical scar is present. Bowel sounds are normal. There is no distension.     Palpations: Abdomen is soft. There is no hepatomegaly.     Tenderness: There is abdominal tenderness in the right upper quadrant, epigastric area and left upper quadrant. There is no right CVA tenderness, left CVA tenderness or rebound. Positive signs include Murphy's sign. Negative signs include McBurney's sign.  Musculoskeletal:     Cervical back: Normal range of motion.  Skin:    General: Skin is warm and dry.     Capillary Refill: Capillary refill takes less than 2 seconds.  Neurological:     General: No focal deficit present.     Mental Status: She is alert and oriented to person, place, and time. Mental status is at baseline.  Psychiatric:        Mood and Affect: Mood normal.        Behavior: Behavior normal.    ED Results / Procedures / Treatments   Labs (all labs ordered are listed, but only abnormal results are displayed) Labs Reviewed  URINALYSIS, ROUTINE W REFLEX MICROSCOPIC - Abnormal; Notable for the following components:      Result Value   APPearance HAZY (*)    Glucose, UA >=500 (*)    Ketones, ur 80 (*)    All other components within normal limits  COMPREHENSIVE METABOLIC PANEL - Abnormal; Notable for the following components:   Sodium 134 (*)    CO2 20 (*)    Glucose, Bld 144 (*)    AST 44 (*)    All other components within normal limits  CBC WITH DIFFERENTIAL/PLATELET - Abnormal; Notable for the following components:   RBC 5.78 (*)  MCV 72.8 (*)    MCH 23.5 (*)    All other components within normal limits  URINALYSIS, MICROSCOPIC (REFLEX) - Abnormal; Notable for the following components:   Bacteria, UA RARE (*)    All other components within normal limits  LIPASE, BLOOD  PREGNANCY, URINE    EKG EKG Interpretation  Date/Time:  Friday July 20 2021 18:41:45 EDT Ventricular Rate:  101 PR Interval:  149 QRS Duration: 89 QT Interval:  401 QTC Calculation: 520 R Axis:   74 Text Interpretation: Sinus tachycardia Borderline low voltage, extremity leads Prolonged QT interval Confirmed by Fredia Sorrow [phone]) on 07/20/2021 6:46:45 PM  Radiology CT Abdomen Pelvis W Contrast  Result Date: 07/20/2021 CLINICAL DATA:  Acute abdominal pain. EXAM: CT ABDOMEN AND PELVIS WITH CONTRAST TECHNIQUE: Multidetector CT imaging of the abdomen and pelvis was performed using the standard protocol following bolus administration of intravenous contrast. CONTRAST:  156mL OMNIPAQUE IOHEXOL 350 MG/ML SOLN COMPARISON:  CT abdomen and pelvis 01/30/2021. FINDINGS: Lower chest: No acute abnormality. Hepatobiliary: Liver is enlarged and there is diffuse fatty infiltration of the liver, unchanged from prior. The gallbladder is surgically absent. There is no bile duct dilatation. Pancreas: Unremarkable. No pancreatic ductal dilatation or surrounding inflammatory changes. Spleen: Normal in size without focal abnormality. Adrenals/Urinary Tract: Adrenal glands are unremarkable. Kidneys are normal, without renal calculi, focal lesion, or hydronephrosis. Bladder is unremarkable. Stomach/Bowel: Stomach is within normal limits. Appendix appears normal. No evidence of bowel wall thickening, distention, or inflammatory changes. Vascular/Lymphatic: Aortic atherosclerosis. No enlarged abdominal or pelvic lymph nodes. Reproductive: Uterus and left adnexa are within normal limits. There is a rounded 3.4 cm right adnexal/right ovarian cyst. Other: No abdominal wall hernia or abnormality. No abdominopelvic ascites. Musculoskeletal: No acute or significant osseous findings. IMPRESSION: 1. 3.4 cm right ovarian cyst. No follow-up imaging recommended. Note: This recommendation does not apply to premenarchal patients and to those with  increased risk (genetic, family history, elevated tumor markers or other high-risk factors) of ovarian cancer. Reference: JACR 2020 Feb; 17(2):248-254 2. Hepatomegaly and hepatic steatosis. Electronically Signed   By: Ronney Asters M.D.   On: 07/20/2021 21:34    Procedures Procedures   Medications Ordered in ED Medications  ondansetron [facility]) injection 4 mg (4 mg Intravenous Given 07/20/21 1838)  morphine 4 MG/ML injection 4 mg (4 mg Intravenous Given 07/20/21 1954)  iohexol (OMNIPAQUE) 350 MG/ML injection 100 mL (100 mLs Intravenous Contrast Given 07/20/21 2126)    ED Course  I have reviewed the triage vital signs and the nursing notes.  Pertinent labs & imaging results that were available during my care of the patient were reviewed by me and considered in my medical decision making (see chart for details).    MDM Rules/Calculators/A&P 45 year old female with history, HPI and work-up as described above.  With GI and abdominal history as described above initial differential is broad. Presentation is not quite consistent with cholecystitis and she is now status post cholecystectomy.  CMP without elevated LFTs or bilirubin so doubt choledocholithiasis or cholangitis. Lipase negative so doubt acute pancreatitis. No reported fevers, no leukocytosis, doubt hepatitis. No urinary symptoms so doubt pyelonephritis. CT abdomen pelvis with contrast with no acute findings.  Doubt PUD. Pain controlled with 4 mg of morphine. Discussed with the patient the findings in her work-up.  Her work-up is reassuring and her presentation is not consistent with another acute, emergent cause abdominal pain at this time.  I have provided her with a prescription for Percocet to take  over the next few days.  She states that she also has Zofran at home from previous GI visits and I have instructed her that it is okay for her to continue to take these at this time.  I have given her strict return precautions for her  abdominal pain and she verbalizes understanding. Vital signs stable and she is safe for discharge at this time.  I discussed this case with Dr. Rogene Houston who agrees with the plan at this time. Final Clinical Impression(s) / ED Diagnoses Final diagnoses:  Pain of upper abdomen    Rx / DC Orders ED Discharge Orders          Ordered    oxyCODONE-acetaminophen (PERCOCET/ROXICET) 5-325 MG tablet  Every 6 hours PRN        07/20/21 2158             Mickie Hillier, PA-C 07/20/21 2205    Fredia Sorrow, MD 07/23/21 1332

## 2021-07-20 NOTE — Discharge Instructions (Addendum)
You are seen in the emergency department today for abdominal pain.  While you were here we did lab work and imaging of your abdomen and pelvis.  All of your lab work was reassuring that there is nothing acute going on in your belly.  Additionally the imaging that we did of your abdomen showed no acute findings that would need intervention at this time.  While you are here we gave you some morphine and Zofran which helped with your symptoms.  You have follow-up on 07/31/2021 with your primary care provider.  Please call her in the morning or on Monday and ensure close follow-up.  Please also call your GI doctor and let them know about your visit and if they need to see you sooner.  I have provided you with a prescription for Percocet.  You have been prescribed a medication that is considered an opiate. Opiates are pain medications that should be used with caution. It is important that you do not drive while taking this medication as it can cause drowsiness and impaired reaction times. Do not mix this medication with benzodiazepine medications or alcohol as this can cause respiratory depression. Additionally, opiates have addicting properties to them. Please use medication as prescribed by your provider.  You may also return to the emergency department for increasing abdominal pain, nausea and vomiting, fevers, or any other reason that is concerning to you.

## 2021-07-23 NOTE — Telephone Encounter (Signed)
Glad to hear that she is feeling better today.  I reviewed her recent ER evaluation which was notable for the following:  - UA with elevated glucose and ketones (80).  Recommend following up with her PCM for that - Sodium 134, blood glucose 144, AST minimally elevated at 44 and ALT 43 with otherwise normal T bili, alkaline phosphatase and remainder of CMP - Normal lipase and CBC at baseline without leukocytosis or anemia - CT abdomen/pelvis: Fatty liver but no duct dilation.  Otherwise normal GI tract, pancreas, spleen - Heart rate 109, otherwise normal vital signs.  EKG performed and reviewed by ER staff  Recommend conservative management at this point since the symptoms have improved by her report and work-up was otherwise pretty unrevealing.  Can have follow-up with me or one of the APP's in the GI clinic.

## 2021-07-24 ENCOUNTER — Encounter: Payer: Self-pay | Admitting: Family

## 2021-08-01 ENCOUNTER — Ambulatory Visit: Payer: No Typology Code available for payment source | Admitting: Family

## 2021-08-10 ENCOUNTER — Ambulatory Visit: Payer: No Typology Code available for payment source | Admitting: Family

## 2021-08-14 ENCOUNTER — Ambulatory Visit: Payer: No Typology Code available for payment source | Admitting: Family

## 2021-08-22 ENCOUNTER — Ambulatory Visit: Payer: No Typology Code available for payment source | Admitting: Family

## 2021-08-29 ENCOUNTER — Ambulatory Visit: Payer: No Typology Code available for payment source | Admitting: Family

## 2021-09-05 ENCOUNTER — Other Ambulatory Visit: Payer: Self-pay | Admitting: Family

## 2021-09-05 ENCOUNTER — Encounter: Payer: Self-pay | Admitting: Family

## 2021-09-05 ENCOUNTER — Other Ambulatory Visit: Payer: Self-pay

## 2021-09-05 ENCOUNTER — Ambulatory Visit (INDEPENDENT_AMBULATORY_CARE_PROVIDER_SITE_OTHER): Payer: No Typology Code available for payment source | Admitting: Family

## 2021-09-05 ENCOUNTER — Other Ambulatory Visit (HOSPITAL_BASED_OUTPATIENT_CLINIC_OR_DEPARTMENT_OTHER): Payer: Self-pay

## 2021-09-05 VITALS — BP 113/78 | HR 104 | Temp 98.6°F | Resp 16 | Wt 268.0 lb

## 2021-09-05 DIAGNOSIS — F4321 Adjustment disorder with depressed mood: Secondary | ICD-10-CM

## 2021-09-05 DIAGNOSIS — F4320 Adjustment disorder, unspecified: Secondary | ICD-10-CM

## 2021-09-05 DIAGNOSIS — K219 Gastro-esophageal reflux disease without esophagitis: Secondary | ICD-10-CM | POA: Diagnosis not present

## 2021-09-05 DIAGNOSIS — E1165 Type 2 diabetes mellitus with hyperglycemia: Secondary | ICD-10-CM | POA: Diagnosis not present

## 2021-09-05 DIAGNOSIS — I10 Essential (primary) hypertension: Secondary | ICD-10-CM | POA: Diagnosis not present

## 2021-09-05 DIAGNOSIS — Z23 Encounter for immunization: Secondary | ICD-10-CM | POA: Diagnosis not present

## 2021-09-05 LAB — COMPREHENSIVE METABOLIC PANEL
ALT: 38 U/L — ABNORMAL HIGH (ref 0–35)
AST: 35 U/L (ref 0–37)
Albumin: 4 g/dL (ref 3.5–5.2)
Alkaline Phosphatase: 93 U/L (ref 39–117)
BUN: 10 mg/dL (ref 6–23)
CO2: 22 mEq/L (ref 19–32)
Calcium: 9.1 mg/dL (ref 8.4–10.5)
Chloride: 104 mEq/L (ref 96–112)
Creatinine, Ser: 0.64 mg/dL (ref 0.40–1.20)
GFR: 108.38 mL/min (ref >60.00)
Glucose, Bld: 189 mg/dL — ABNORMAL HIGH (ref 70–99)
Potassium: 4.4 mEq/L (ref 3.5–5.1)
Sodium: 137 mEq/L (ref 135–145)
Total Bilirubin: 0.4 mg/dL (ref 0.2–1.2)
Total Protein: 6.5 g/dL (ref 6.0–8.3)

## 2021-09-05 LAB — HEMOGLOBIN A1C: Hgb A1c MFr Bld: 7.8 % — ABNORMAL HIGH (ref 4.6–6.5)

## 2021-09-05 MED ORDER — HYDROXYZINE HCL 25 MG PO TABS
50.0000 mg | ORAL_TABLET | Freq: Every evening | ORAL | 3 refills | Status: DC | PRN
  Filled 2021-09-05: qty 60, 30d supply, fill #0
  Filled 2021-11-08: qty 60, 30d supply, fill #1
  Filled 2021-12-05: qty 60, 30d supply, fill #2
  Filled 2022-01-15: qty 60, 30d supply, fill #3

## 2021-09-05 MED ORDER — VARENICLINE TARTRATE 0.5 MG X 11 & 1 MG X 42 PO TBPK
ORAL_TABLET | ORAL | 0 refills | Status: DC
  Filled 2021-09-05: qty 53, 28d supply, fill #0

## 2021-09-05 NOTE — Assessment & Plan Note (Signed)
Clinically stable on pepcid and protonix. Continue same.

## 2021-09-05 NOTE — Progress Notes (Signed)
Subjective:   By signing my name below, I, Shehryar Baig, attest that this documentation has been prepared under the direction and in the presence of [name] NP. 09/05/2021    Patient ID: Elizabeth [name], female    DOB: [birth year], 45 y.o.   MRN: [id number]  Chief Complaint  Patient presents with   Diabetes    Here for follow up   Depression    Follow up depression with anxiety    Diabetes  Depression       Patient is in today for a office visit.   Depression/Anxiety- Her depression has improved since her last visit. She reports her mother recently had surgery and she was out of work to take care of her. She was not taking her medication during that time. She is doing better at this time.  Blood sugars- She regularly checks her blood sugar at home. She reports they typically measure from 143-170. She thinks her machine may be broken due to giving a large range of measurements. She continues taking trulicity injections to manage her diabetes.   Lab Results  Component Value Date   HGBA1C 7.6 (H) 04/06/2021   Blood pressure- She continues taking 10 mg amlodipine daily PO, 40 mg lisinopril daily PO and reports no new issues while taking them.   BP Readings from Last 3 Encounters:  09/05/21 113/78  07/20/21 135/85  06/26/21 122/82   Pulse Readings from Last 3 Encounters:  09/05/21 (!) 104  07/20/21 91  06/26/21 94   Smoking- She is requesting aide to help her quit smoking.  Reflux- She continues taking 20 mg Pepcid daily PO in the morning and 40 mg Protonix daily PO and reports no new issues while taking them.  Birth control- She has a nexplanon implant for birth control. She continues seeing a GYN specialist regularly. Sleep- She continues taking 25 mg hydroxyzine daily PO to manage her sleep and reports no new issues while taking it.  Immunizations- She is eligible for a pneumonia vaccine and is interested in receiving it. She is UTD on flu vaccines. She was  informed of the bivalent Covid-19 vaccine and is interested in receiving it at a later time at her pharmacy.  Vision- She has an upcomming appointment for vision care.    Health Maintenance Due  Topic Date Due   OPHTHALMOLOGY EXAM  03/21/2018   FOOT EXAM  04/19/2020   COVID-19 Vaccine (4 - Booster for Pfizer series) 11/03/2020    Past Medical History:  Diagnosis Date   Anemia    Arthritis of left knee    Astigmatism    Depression    Diabetes mellitus without complication (HCC)    Dry mouth    Fatigue    GERD (gastroesophageal reflux disease)    Heartburn    High cholesterol    Hypertension    Keratoconjunctivitis    Muscle pain    Vitamin D deficiency    Weakness     Past Surgical History:  Procedure Laterality Date   BIOPSY  01/18/2021   Procedure: BIOPSY;  Surgeon: Lavena Bullion, DO;  Location: [facility];  Service: Gastroenterology;;   CHOLECYSTECTOMY N/A 05/11/2021   Procedure: LAPAROSCOPIC CHOLECYSTECTOMY WITH INTRAOPERATIVE CHOLANGIOGRAM;  Surgeon: Felicie Morn, MD;  Location: [facility];  Service: General;  Laterality: N/A;   DILATION AND CURETTAGE OF UTERUS     ENDOMETRIAL BIOPSY  12/19/2017   normal per pt   ESOPHAGOGASTRODUODENOSCOPY (EGD) WITH PROPOFOL N/A 01/18/2021   Procedure: ESOPHAGOGASTRODUODENOSCOPY (  EGD) WITH PROPOFOL;  Surgeon: Lavena Bullion, DO;  Location: [facility];  Service: Gastroenterology;  Laterality: N/A;   TONSILLECTOMY     TUBAL LIGATION      Family History  Problem Relation Age of Onset   Diabetes Mother    Hypertension Mother    Hyperlipidemia Mother    Obesity Mother    Ovarian cancer Maternal Grandmother    HIV Father        died from complications (IVDU)   Alcohol abuse Father    Drug abuse Father    Colon cancer Neg Hx    Esophageal cancer Neg Hx    Liver disease Neg Hx    Pancreatic cancer Neg Hx    Stomach cancer Neg Hx     Social History   Socioeconomic History   Marital status: Married    Spouse  name: [name]   Number of children: 2   Years of education: Not on file   Highest education level: Not on file  Occupational History   Occupation: CMA - Economist: [employer]  Tobacco Use   Smoking status: Every Day    Packs/day: 0.50    Years: 3.00    Pack years: 1.50    Types: Cigarettes   Smokeless tobacco: Never   Tobacco comments:    4-5/day  Vaping Use   Vaping Use: Never used  Substance and Sexual Activity   Alcohol use: No   Drug use: No   Sexual activity: Yes    Partners: Male    Birth control/protection: Surgical, Implant  Other Topics Concern   Not on file  Social History Narrative    2 children   1997- son Allen Kell   2000- son Insurance claims handler in [place] in [place]   Enjoys reading   Widowed 2023/07/19, husband died from COVID-48.   Social Determinants of Health   Financial Resource Strain: Not on file  Food Insecurity: Not on file  Transportation Needs: Not on file  Physical Activity: Not on file  Stress: Not on file  Social Connections: Not on file  Intimate Partner Violence: Not on file    Outpatient Medications Prior to Visit  Medication Sig Dispense Refill   albuterol (VENTOLIN HFA) 108 (90 Base) MCG/ACT inhaler Inhale 2 puffs by mouth into the lungs every 6 (six) hours as needed for wheezing or shortness of breath. 8.5 g 0   amLODipine (NORVASC) 10 MG tablet TAKE 1 TABLET BY MOUTH ONCE DAILY 90 tablet 0   Dapagliflozin-metFORMIN HCl ER (XIGDUO XR) 02-999 MG TB24 TAKE 2 TABLETS BY MOUTH DAILY BEFORE BREAKFAST. (Patient taking differently: Take 2 tablets by mouth daily.) 180 tablet 1   Dulaglutide (TRULICITY) 3 LM/7.8ML SOPN Inject 3 mg as directed once a week. 2 mL 5   etonogestrel (NEXPLANON) 68 MG IMPL implant 1 each by Subdermal route once.     famotidine (PEPCID) 20 MG tablet Take 1 tablet (20 mg total) by mouth daily. 90 tablet 3   hydrOXYzine (ATARAX/VISTARIL) 25 MG tablet Take 2 tablets (50 mg total) by mouth  at bedtime as needed for insomnia. (Patient taking differently: Take 50 mg by mouth at bedtime.) 60 tablet 3   lisinopril (ZESTRIL) 40 MG tablet TAKE 1 TABLET BY MOUTH ONCE DAILY 90 tablet 0   lovastatin (MEVACOR) 40 MG tablet TAKE 1 TABLET (40 MG TOTAL) BY MOUTH AT BEDTIME. 90 tablet 1   pantoprazole (PROTONIX) 40 MG tablet Take 1 tablet (  40 mg total) by mouth 2 (two) times daily. 180 tablet 5   cholestyramine (QUESTRAN) 4 g packet Mix 1 packet in 60-180 ml of water, milk, or juice and drink twice a day before meals for 30 days 60 packet 0   dicyclomine (BENTYL) 20 MG tablet Take 2 tablets (40 mg total) by mouth 2 (two) times daily as needed for spasms. 120 tablet 2   No facility-administered medications prior to visit.    No Known Allergies  Review of Systems  Psychiatric/Behavioral:  Positive for depression.       Objective:    Physical Exam Constitutional:      General: She is not in acute distress.    Appearance: Normal appearance. She is not ill-appearing.  HENT:     Head: Normocephalic and atraumatic.     Right Ear: External ear normal.     Left Ear: External ear normal.  Eyes:     Extraocular Movements: Extraocular movements intact.     Pupils: Pupils are equal, round, and reactive to light.  Cardiovascular:     Rate and Rhythm: Normal rate and regular rhythm.     Heart sounds: Normal heart sounds. No murmur heard.   No gallop.  Pulmonary:     Effort: Pulmonary effort is normal. No respiratory distress.     Breath sounds: Normal breath sounds. No wheezing or rales.  Skin:    General: Skin is warm and dry.  Neurological:     Mental Status: She is alert and oriented to person, place, and time.  Psychiatric:        Behavior: Behavior normal.    BP 113/78 (BP Location: Right Arm, Patient Position: Sitting, Cuff Size: Large)   Pulse (!) 104   Temp 98.6 F (37 C) (Oral)   Resp 16   Wt 268 lb (121.6 kg)   SpO2 (!) 10%   BMI 49.02 kg/m  Wt Readings from Last 3  Encounters:  09/05/21 268 lb (121.6 kg)  07/20/21 292 lb 15.9 oz (132.9 kg)  06/26/21 293 lb (132.9 kg)       Assessment & Plan:   Problem List Items Addressed This Visit       Unprioritized   Uncontrolled type 2 diabetes mellitus with hyperglycemia ([place])    Lab Results  Component Value Date   HGBA1C 7.6 (H) 04/06/2021   HGBA1C 6.9 (H) 12/08/2020   HGBA1C 6.6 (H) 07/14/2020   Lab Results  Component Value Date   LDLCALC 85 07/14/2020   CREATININE 0.72 07/20/2021  Maintained on Trulicity.  Reports home readings are in the mid 100's generally.        Relevant Orders   Hemoglobin A1c   Comp Met (CMET)   Grief reaction    She seems to be doing much better overall. She is sleeping well and doing well at work.       Gastroesophageal reflux disease without esophagitis    Clinically stable on pepcid and protonix. Continue same.       Essential hypertension    BP Readings from Last 3 Encounters:  09/05/21 113/78  07/20/21 135/85  06/26/21 122/82  Stable on amlodipine 18m.  Continue same.       Relevant Orders   Comp Met (CMET)   Other Visit Diagnoses     Immunization due    -  Primary   Relevant Orders   Pneumococcal conjugate vaccine 20-valent (Prevnar 20) (Completed)        Meds ordered this encounter  Medications   varenicline (CHANTIX PAK) 0.5 MG X 11 & 1 MG X 42 tablet    Sig: Take one 0.5 mg tablet by mouth once daily for 3 days, then increase to one 0.5 mg tablet twice daily for 4 days, then increase to one 1 mg tablet twice daily.    Dispense:  53 tablet    Refill:  0    Order Specific Question:   Supervising Provider    Answer:   Penni Homans A [[id number]]    I, [name] NP, personally preformed the services described in this documentation.  All medical record entries made by the scribe were at my direction and in my presence.  I have reviewed the chart and discharge instructions (if applicable) and agree that the record reflects my personal  performance and is accurate and complete. 09/05/2021   I,Shehryar Baig,acting as a Education administrator for [name], NP.,have documented all relevant documentation on the behalf of [name], NP,as directed by  [name], NP while in the presence of [name], NP.   [name], NP

## 2021-09-05 NOTE — Patient Instructions (Addendum)
Please complete lab work prior to leaving.  Start chantix.  Please get your covid booster [employer]).

## 2021-09-05 NOTE — Assessment & Plan Note (Signed)
She seems to be doing much better overall. She is sleeping well and doing well at work.

## 2021-09-05 NOTE — Assessment & Plan Note (Addendum)
BP Readings from Last 3 Encounters:  09/05/21 113/78  07/20/21 135/85  06/26/21 122/82   Stable on amlodipine 10mg .  Continue same.

## 2021-09-05 NOTE — Assessment & Plan Note (Signed)
Lab Results  Component Value Date   CHOL 147 07/14/2020   HDL 48 (L) 07/14/2020   LDLCALC 85 07/14/2020   TRIG 68 07/14/2020   CHOLHDL 3.1 07/14/2020   Continue mevacor. Will need follow up lipid panel next visit.

## 2021-09-05 NOTE — Assessment & Plan Note (Signed)
Lab Results  Component Value Date   HGBA1C 7.6 (H) 04/06/2021   HGBA1C 6.9 (H) 12/08/2020   HGBA1C 6.6 (H) 07/14/2020   Lab Results  Component Value Date   LDLCALC 85 07/14/2020   CREATININE 0.72 07/20/2021   Maintained on Trulicity.  Reports home readings are in the mid 100's generally.

## 2021-09-06 ENCOUNTER — Encounter: Payer: Self-pay | Admitting: Family

## 2021-09-06 ENCOUNTER — Telehealth: Payer: Self-pay | Admitting: Family

## 2021-09-06 ENCOUNTER — Other Ambulatory Visit (HOSPITAL_BASED_OUTPATIENT_CLINIC_OR_DEPARTMENT_OTHER): Payer: Self-pay

## 2021-09-06 MED ORDER — TRULICITY 4.5 MG/0.5ML ~~LOC~~ SOAJ
4.5000 mg | SUBCUTANEOUS | 5 refills | Status: DC
  Filled 2021-09-06: qty 2, 28d supply, fill #0

## 2021-09-06 NOTE — Telephone Encounter (Signed)
Sugar remains above goal. I would like to increase her trulicity to 4.5mg  injection once weekly.  Let me know if she has any issues with nausea at this dose.

## 2021-09-06 NOTE — Telephone Encounter (Signed)
Advised patient of note below and she stated that she not want to change medication dose.

## 2021-09-06 NOTE — Telephone Encounter (Signed)
Lvm for patient to call back about this results

## 2021-09-07 ENCOUNTER — Other Ambulatory Visit (HOSPITAL_BASED_OUTPATIENT_CLINIC_OR_DEPARTMENT_OTHER): Payer: Self-pay

## 2021-09-07 MED ORDER — TRULICITY 3 MG/0.5ML ~~LOC~~ SOAJ
3.0000 mg | SUBCUTANEOUS | 1 refills | Status: DC
  Filled 2021-09-07: qty 2, 28d supply, fill #0
  Filled 2021-10-07: qty 2, 28d supply, fill #1

## 2021-09-07 NOTE — Telephone Encounter (Signed)
See mychart.  

## 2021-09-11 ENCOUNTER — Other Ambulatory Visit (HOSPITAL_BASED_OUTPATIENT_CLINIC_OR_DEPARTMENT_OTHER): Payer: Self-pay

## 2021-09-27 ENCOUNTER — Encounter: Payer: Self-pay | Admitting: Family

## 2021-09-28 ENCOUNTER — Other Ambulatory Visit (HOSPITAL_BASED_OUTPATIENT_CLINIC_OR_DEPARTMENT_OTHER): Payer: Self-pay

## 2021-09-28 MED ORDER — ONDANSETRON HCL 4 MG PO TABS
4.0000 mg | ORAL_TABLET | Freq: Three times a day (TID) | ORAL | 0 refills | Status: DC | PRN
  Filled 2021-09-28: qty 30, 10d supply, fill #0

## 2021-10-07 ENCOUNTER — Encounter: Payer: Self-pay | Admitting: Family

## 2021-10-07 ENCOUNTER — Other Ambulatory Visit: Payer: Self-pay | Admitting: Family

## 2021-10-08 ENCOUNTER — Other Ambulatory Visit (HOSPITAL_BASED_OUTPATIENT_CLINIC_OR_DEPARTMENT_OTHER): Payer: Self-pay

## 2021-10-08 MED ORDER — VARENICLINE TARTRATE 1 MG PO TABS
1.0000 mg | ORAL_TABLET | Freq: Two times a day (BID) | ORAL | 1 refills | Status: DC
  Filled 2021-10-08: qty 60, 30d supply, fill #0
  Filled 2021-11-08: qty 60, 30d supply, fill #1

## 2021-10-08 MED ORDER — XIGDUO XR 5-1000 MG PO TB24
ORAL_TABLET | ORAL | 0 refills | Status: DC
  Filled 2021-10-08: qty 180, 90d supply, fill #0

## 2021-10-08 MED ORDER — LISINOPRIL 40 MG PO TABS
ORAL_TABLET | Freq: Every day | ORAL | 0 refills | Status: DC
  Filled 2021-10-08: qty 90, 90d supply, fill #0

## 2021-10-08 MED ORDER — AMLODIPINE BESYLATE 10 MG PO TABS
ORAL_TABLET | Freq: Every day | ORAL | 0 refills | Status: DC
  Filled 2021-10-08: qty 90, 90d supply, fill #0

## 2021-10-12 ENCOUNTER — Encounter: Payer: Self-pay | Admitting: Family

## 2021-10-30 DIAGNOSIS — Z6841 Body Mass Index (BMI) 40.0 and over, adult: Secondary | ICD-10-CM | POA: Diagnosis not present

## 2021-10-30 DIAGNOSIS — I10 Essential (primary) hypertension: Secondary | ICD-10-CM | POA: Diagnosis not present

## 2021-10-30 DIAGNOSIS — E559 Vitamin D deficiency, unspecified: Secondary | ICD-10-CM | POA: Diagnosis not present

## 2021-10-30 DIAGNOSIS — E119 Type 2 diabetes mellitus without complications: Secondary | ICD-10-CM | POA: Diagnosis not present

## 2021-10-30 DIAGNOSIS — Z634 Disappearance and death of family member: Secondary | ICD-10-CM | POA: Diagnosis not present

## 2021-10-31 ENCOUNTER — Encounter: Payer: Self-pay | Admitting: Family

## 2021-10-31 NOTE — Telephone Encounter (Signed)
Is there an opening today with another provider or tomorrow AM?

## 2021-11-01 ENCOUNTER — Telehealth (INDEPENDENT_AMBULATORY_CARE_PROVIDER_SITE_OTHER): Payer: No Typology Code available for payment source | Admitting: Family Medicine

## 2021-11-01 ENCOUNTER — Other Ambulatory Visit (HOSPITAL_BASED_OUTPATIENT_CLINIC_OR_DEPARTMENT_OTHER): Payer: Self-pay

## 2021-11-01 ENCOUNTER — Encounter: Payer: Self-pay | Admitting: Family Medicine

## 2021-11-01 DIAGNOSIS — J329 Chronic sinusitis, unspecified: Secondary | ICD-10-CM | POA: Diagnosis not present

## 2021-11-01 MED ORDER — CARESTART COVID-19 HOME TEST VI KIT
PACK | 0 refills | Status: DC
  Filled 2021-11-01: qty 2, 4d supply, fill #0

## 2021-11-01 MED ORDER — AZITHROMYCIN 250 MG PO TABS
ORAL_TABLET | ORAL | 0 refills | Status: AC
  Filled 2021-11-01: qty 6, 5d supply, fill #0

## 2021-11-02 DIAGNOSIS — J329 Chronic sinusitis, unspecified: Secondary | ICD-10-CM | POA: Insufficient documentation

## 2021-11-02 NOTE — Progress Notes (Signed)
MyChart Video Visit    Virtual Visit via Video Note   This visit type was conducted due to national recommendations for restrictions regarding the COVID-19 Pandemic (e.g. social distancing) in an effort to limit this patient's exposure and mitigate transmission in our community. This patient is at least at moderate risk for complications without adequate follow up. This format is felt to be most appropriate for this patient at this time. Physical exam was limited by quality of the video and audio technology used for the visit. S Chism, CMA was able to get the patient set up on a video visit.  Patient location: home Patient and provider in visit Provider location: Office  I discussed the limitations of evaluation and management by telemedicine and the availability of in person appointments. The patient expressed understanding and agreed to proceed.  Visit Date: 11/01/2021  Today's healthcare provider: Penni Homans, MD     Subjective:    Patient ID: Elizabeth [name], female    DOB: 1976/10/09, 46 y.o.   MRN: [id number]  Chief Complaint  Patient presents with   Sinus Problem    HPI Patient is in today for evaluation of congestion, sore throat, malaise, cough. Her son had COVID over New Years but patient tests negative. Denies CP/palp/SOB/fevers/GI or GU c/o. Taking meds as prescribed   Past Medical History:  Diagnosis Date   Anemia    Arthritis of left knee    Astigmatism    Depression    Diabetes mellitus without complication (HCC)    Dry mouth    Fatigue    GERD (gastroesophageal reflux disease)    Heartburn    High cholesterol    Hypertension    Keratoconjunctivitis    Muscle pain    Vitamin D deficiency    Weakness     Past Surgical History:  Procedure Laterality Date   BIOPSY  01/18/2021   Procedure: BIOPSY;  Surgeon: Lavena Bullion, DO;  Location: [facility];  Service: Gastroenterology;;   CHOLECYSTECTOMY N/A 05/11/2021   Procedure: LAPAROSCOPIC  CHOLECYSTECTOMY WITH INTRAOPERATIVE CHOLANGIOGRAM;  Surgeon: Felicie Morn, MD;  Location: [facility];  Service: General;  Laterality: N/A;   DILATION AND CURETTAGE OF UTERUS     ENDOMETRIAL BIOPSY  12/19/2017   normal per pt   ESOPHAGOGASTRODUODENOSCOPY (EGD) WITH PROPOFOL N/A 01/18/2021   Procedure: ESOPHAGOGASTRODUODENOSCOPY (EGD) WITH PROPOFOL;  Surgeon: Lavena Bullion, DO;  Location: [facility];  Service: Gastroenterology;  Laterality: N/A;   TONSILLECTOMY     TUBAL LIGATION      Family History  Problem Relation Age of Onset   Diabetes Mother    Hypertension Mother    Hyperlipidemia Mother    Obesity Mother    Ovarian cancer Maternal Grandmother    HIV Father        died from complications (IVDU)   Alcohol abuse Father    Drug abuse Father    Colon cancer Neg Hx    Esophageal cancer Neg Hx    Liver disease Neg Hx    Pancreatic cancer Neg Hx    Stomach cancer Neg Hx     Social History   Socioeconomic History   Marital status: Married    Spouse name: [name]   Number of children: 2   Years of education: Not on file   Highest education level: Not on file  Occupational History   Occupation: [employer]    Employer: [employer]  Tobacco Use   Smoking status: Every Day  Packs/day: 0.50    Years: 3.00    Pack years: 1.50    Types: Cigarettes   Smokeless tobacco: Never   Tobacco comments:    4-5/day  Vaping Use   Vaping Use: Never used  Substance and Sexual Activity   Alcohol use: No   Drug use: No   Sexual activity: Yes    Partners: Male    Birth control/protection: Surgical, Implant  Other Topics Concern   Not on file  Social History Narrative    2 children   67- son Allen Kell   2000- son Insurance claims handler in [place] in [place]   Enjoys reading   Widowed 30-Jul-2023, husband died from COVID-67.   Social Determinants of Health   Financial Resource Strain: Not on file  Food Insecurity: Not on file  Transportation  Needs: Not on file  Physical Activity: Not on file  Stress: Not on file  Social Connections: Not on file  Intimate Partner Violence: Not on file    Outpatient Medications Prior to Visit  Medication Sig Dispense Refill   amLODipine (NORVASC) 10 MG tablet TAKE 1 TABLET BY MOUTH ONCE DAILY 90 tablet 0   Dapagliflozin-metFORMIN HCl ER (XIGDUO XR) 02-999 MG TB24 TAKE 2 TABLETS BY MOUTH DAILY BEFORE BREAKFAST. (Patient taking differently: Take 2 tablets by mouth daily.) 180 tablet 1   Dulaglutide (TRULICITY) 3 WU/9.8JX SOPN Inject 3 mg as directed once a week. 2 mL 1   etonogestrel (NEXPLANON) 68 MG IMPL implant 1 each by Subdermal route once.     famotidine (PEPCID) 20 MG tablet Take 1 tablet (20 mg total) by mouth daily. 90 tablet 3   hydrOXYzine (ATARAX/VISTARIL) 25 MG tablet Take 2 tablets (50 mg total) by mouth at bedtime as needed for insomnia. 60 tablet 3   lisinopril (ZESTRIL) 40 MG tablet TAKE 1 TABLET BY MOUTH ONCE DAILY 90 tablet 0   lovastatin (MEVACOR) 40 MG tablet TAKE 1 TABLET (40 MG TOTAL) BY MOUTH AT BEDTIME. 90 tablet 1   pantoprazole (PROTONIX) 40 MG tablet Take 1 tablet (40 mg total) by mouth 2 (two) times daily. 180 tablet 5   varenicline (CHANTIX CONTINUING MONTH PAK) 1 MG tablet Take 1 tablet (1 mg total) by mouth 2 (two) times daily. 60 tablet 1   XIGDUO XR 02-999 MG TB24 Take 2 tablets by mouth daily before breakfast. 180 tablet 0   albuterol (VENTOLIN HFA) 108 (90 Base) MCG/ACT inhaler Inhale 2 puffs by mouth into the lungs every 6 (six) hours as needed for wheezing or shortness of breath. 8.5 g 0   ondansetron (ZOFRAN) 4 MG tablet Take 1 tablet (4 mg total) by mouth every 8 (eight) hours as needed for nausea or vomiting. 30 tablet 0   No facility-administered medications prior to visit.    No Known Allergies  Review of Systems  Constitutional:  Positive for malaise/fatigue. Negative for fever.  HENT:  Positive for congestion, sinus pain and sore throat.   Eyes:   Negative for blurred vision.  Respiratory:  Positive for cough and sputum production. Negative for shortness of breath.   Cardiovascular:  Negative for chest pain, palpitations and leg swelling.  Gastrointestinal:  Negative for abdominal pain, blood in stool and nausea.  Genitourinary:  Negative for dysuria and frequency.  Musculoskeletal:  Negative for falls.  Skin:  Negative for rash.  Neurological:  Negative for dizziness, loss of consciousness and headaches.  Endo/Heme/Allergies:  Negative for environmental allergies.  Psychiatric/Behavioral:  Negative for  depression. The patient is not nervous/anxious.       Objective:    Physical Exam Constitutional:      General: She is not in acute distress.    Appearance: Normal appearance. She is not ill-appearing or toxic-appearing.  HENT:     Head: Normocephalic and atraumatic.     Right Ear: External ear normal.     Left Ear: External ear normal.     Nose: Nose normal.  Eyes:     General:        Right eye: No discharge.        Left eye: No discharge.  Pulmonary:     Effort: Pulmonary effort is normal.  Skin:    Findings: No rash.  Neurological:     Mental Status: She is alert and oriented to person, place, and time.  Psychiatric:        Behavior: Behavior normal.    BP 123/89    Pulse (!) 119    Temp 98.9 F (37.2 C)    Wt 286 lb (129.7 kg)    SpO2 92%    BMI 52.31 kg/m  Wt Readings from Last 3 Encounters:  11/01/21 286 lb (129.7 kg)  09/05/21 268 lb (121.6 kg)  07/20/21 292 lb 15.9 oz (132.9 kg)    Diabetic Foot Exam - Simple   No data filed    Lab Results  Component Value Date   WBC 9.4 07/20/2021   HGB 13.6 07/20/2021   HCT 42.1 07/20/2021   PLT 295 07/20/2021   GLUCOSE 189 (H) 09/05/2021   CHOL 147 07/14/2020   TRIG 68 07/14/2020   HDL 48 (L) 07/14/2020   LDLCALC 85 07/14/2020   ALT 38 (H) 09/05/2021   AST 35 09/05/2021   NA 137 09/05/2021   K 4.4 09/05/2021   CL 104 09/05/2021   CREATININE 0.64  09/05/2021   BUN 10 09/05/2021   CO2 22 09/05/2021   TSH 0.837 12/22/2018   INR 1.02 06/25/2011   HGBA1C 7.8 (H) 09/05/2021    Lab Results  Component Value Date   TSH 0.837 12/22/2018   Lab Results  Component Value Date   WBC 9.4 07/20/2021   HGB 13.6 07/20/2021   HCT 42.1 07/20/2021   MCV 72.8 (L) 07/20/2021   PLT 295 07/20/2021   Lab Results  Component Value Date   NA 137 09/05/2021   K 4.4 09/05/2021   CO2 22 09/05/2021   GLUCOSE 189 (H) 09/05/2021   BUN 10 09/05/2021   CREATININE 0.64 09/05/2021   BILITOT 0.4 09/05/2021   ALKPHOS 93 09/05/2021   AST 35 09/05/2021   ALT 38 (H) 09/05/2021   PROT 6.5 09/05/2021   ALBUMIN 4.0 09/05/2021   CALCIUM 9.1 09/05/2021   ANIONGAP 12 07/20/2021   GFR 108.38 09/05/2021   Lab Results  Component Value Date   CHOL 147 07/14/2020   Lab Results  Component Value Date   HDL 48 (L) 07/14/2020   Lab Results  Component Value Date   LDLCALC 85 07/14/2020   Lab Results  Component Value Date   TRIG 68 07/14/2020   Lab Results  Component Value Date   CHOLHDL 3.1 07/14/2020   Lab Results  Component Value Date   HGBA1C 7.8 (H) 09/05/2021       Assessment & Plan:   Problem List Items Addressed This Visit     Sinusitis    She has been feeling poorly for about a week with sinus pressure, congestion, cough,  sore throat, malaise and fatigue. Negative COVID test. Started on azithromycin, mucinex and probiotics. Encouraged increased rest and hydration, add probiotics Treat fevers as needed      Relevant Medications   azithromycin (ZITHROMAX) 250 MG tablet    I have discontinued Rainbow Medeiros's albuterol and ondansetron. I am also having her start on azithromycin. Additionally, I am having her maintain her Nexplanon, pantoprazole, Dapagliflozin-metFORMIN HCl ER, famotidine, lovastatin, hydrOXYzine, Trulicity, lisinopril, amLODipine, varenicline, and Xigduo XR.  Meds ordered this encounter  Medications   azithromycin  (ZITHROMAX) 250 MG tablet    Sig: Take 2 tablets on day 1, then 1 tablet daily on days 2 through 5    Dispense:  6 tablet    Refill:  0    I discussed the assessment and treatment plan with the patient. The patient was provided an opportunity to ask questions and all were answered. The patient agreed with the plan and demonstrated an understanding of the instructions.   The patient was advised to call back or seek an in-person evaluation if the symptoms worsen or if the condition fails to improve as anticipated.  I provided 10 minutes of face-to-face time during this encounter.   Penni Homans, MD [facility] [phone] (phone) [phone] (fax)  [place]

## 2021-11-02 NOTE — Assessment & Plan Note (Signed)
She has been feeling poorly for about a week with sinus pressure, congestion, cough, sore throat, malaise and fatigue. Negative COVID test. Started on azithromycin, mucinex and probiotics. Encouraged increased rest and hydration, add probiotics Treat fevers as needed

## 2021-11-07 ENCOUNTER — Encounter: Payer: Self-pay | Admitting: Family

## 2021-11-07 ENCOUNTER — Telehealth (INDEPENDENT_AMBULATORY_CARE_PROVIDER_SITE_OTHER): Payer: No Typology Code available for payment source | Admitting: Family

## 2021-11-07 ENCOUNTER — Other Ambulatory Visit (HOSPITAL_BASED_OUTPATIENT_CLINIC_OR_DEPARTMENT_OTHER): Payer: Self-pay

## 2021-11-07 DIAGNOSIS — F32A Depression, unspecified: Secondary | ICD-10-CM | POA: Diagnosis not present

## 2021-11-07 MED ORDER — ESCITALOPRAM OXALATE 10 MG PO TABS
ORAL_TABLET | ORAL | 0 refills | Status: DC
  Filled 2021-11-07: qty 30, 30d supply, fill #0

## 2021-11-07 NOTE — Progress Notes (Signed)
MyChart Video Visit    Virtual Visit via Video Note   This visit type was conducted due to national recommendations for restrictions regarding the COVID-19 Pandemic (e.g. social distancing) in an effort to limit this patient's exposure and mitigate transmission in our community. This patient is at least at moderate risk for complications without adequate follow up. This format is felt to be most appropriate for this patient at this time. Physical exam was limited by quality of the video and audio technology used for the visit.  CMAwas able to get the patient set up on a video visit.  Patient location: work  Provider location: Office  I discussed the limitations of evaluation and management by telemedicine and the availability of in person appointments. The patient expressed understanding and agreed to proceed.  Visit Date: 11/07/2021  Today's healthcare provider: [name], NP     Subjective:    Patient ID: Elizabeth [name], female    DOB: 03/19/1976, 46 y.o.   MRN: [id number]  Chief Complaint  Patient presents with   Depression    Patient will like to go back on lexapro    Depression       Patient is in today for a video visit.  Depression- She is requesting to resume taking lexapro. She is frequently crying and her mind is racing at night. Her work is increasing her stress. She has FMLA for her work dur to depression. She is requesting to renew it for next month. She previously stopped taking lexapro due to her symptoms improving. She continues seeing a counselor at this time and reports having an [place] appointment today.    Past Medical History:  Diagnosis Date   Anemia    Arthritis of left knee    Astigmatism    Depression    Diabetes mellitus without complication (HCC)    Dry mouth    Fatigue    GERD (gastroesophageal reflux disease)    Heartburn    High cholesterol    Hypertension    Keratoconjunctivitis    Muscle pain    Vitamin D deficiency     Weakness     Past Surgical History:  Procedure Laterality Date   BIOPSY  01/18/2021   Procedure: BIOPSY;  Surgeon: Lavena Bullion, DO;  Location: [facility];  Service: Gastroenterology;;   CHOLECYSTECTOMY N/A 05/11/2021   Procedure: LAPAROSCOPIC CHOLECYSTECTOMY WITH INTRAOPERATIVE CHOLANGIOGRAM;  Surgeon: Felicie Morn, MD;  Location: [facility];  Service: General;  Laterality: N/A;   DILATION AND CURETTAGE OF UTERUS     ENDOMETRIAL BIOPSY  12/19/2017   normal per pt   ESOPHAGOGASTRODUODENOSCOPY (EGD) WITH PROPOFOL N/A 01/18/2021   Procedure: ESOPHAGOGASTRODUODENOSCOPY (EGD) WITH PROPOFOL;  Surgeon: Lavena Bullion, DO;  Location: [facility];  Service: Gastroenterology;  Laterality: N/A;   TONSILLECTOMY     TUBAL LIGATION      Family History  Problem Relation Age of Onset   Diabetes Mother    Hypertension Mother    Hyperlipidemia Mother    Obesity Mother    Ovarian cancer Maternal Grandmother    HIV Father        died from complications (IVDU)   Alcohol abuse Father    Drug abuse Father    Colon cancer Neg Hx    Esophageal cancer Neg Hx    Liver disease Neg Hx    Pancreatic cancer Neg Hx    Stomach cancer Neg Hx     Social History   Socioeconomic History  Marital status: Married    Spouse name: [name]   Number of children: 2   Years of education: Not on file   Highest education level: Not on file  Occupational History   Occupation: [place]    Employer: [employer]  Tobacco Use   Smoking status: Every Day    Packs/day: 0.50    Years: 3.00    Pack years: 1.50    Types: Cigarettes   Smokeless tobacco: Never   Tobacco comments:    4-5/day  Vaping Use   Vaping Use: Never used  Substance and Sexual Activity   Alcohol use: No   Drug use: No   Sexual activity: Yes    Partners: Male    Birth control/protection: Surgical, Implant  Other Topics Concern   Not on file  Social History Narrative    2 children   1997- son Allen Kell    2000- son Insurance claims handler in [place] in [place]   Enjoys reading   Widowed July 25, 2023, husband died from COVID-17.   Social Determinants of Health   Financial Resource Strain: Not on file  Food Insecurity: Not on file  Transportation Needs: Not on file  Physical Activity: Not on file  Stress: Not on file  Social Connections: Not on file  Intimate Partner Violence: Not on file    Outpatient Medications Prior to Visit  Medication Sig Dispense Refill   amLODipine (NORVASC) 10 MG tablet TAKE 1 TABLET BY MOUTH ONCE DAILY 90 tablet 0   COVID-19 At Home Antigen Test (CARESTART COVID-19 HOME TEST) KIT Use as directed. 2 kit 0   Dapagliflozin-metFORMIN HCl ER (XIGDUO XR) 02-999 MG TB24 TAKE 2 TABLETS BY MOUTH DAILY BEFORE BREAKFAST. (Patient taking differently: Take 2 tablets by mouth daily.) 180 tablet 1   Dulaglutide (TRULICITY) 3 TS/1.7BL SOPN Inject 3 mg as directed once a week. 2 mL 1   etonogestrel (NEXPLANON) 68 MG IMPL implant 1 each by Subdermal route once.     famotidine (PEPCID) 20 MG tablet Take 1 tablet (20 mg total) by mouth daily. 90 tablet 3   hydrOXYzine (ATARAX/VISTARIL) 25 MG tablet Take 2 tablets (50 mg total) by mouth at bedtime as needed for insomnia. 60 tablet 3   lisinopril (ZESTRIL) 40 MG tablet TAKE 1 TABLET BY MOUTH ONCE DAILY 90 tablet 0   lovastatin (MEVACOR) 40 MG tablet TAKE 1 TABLET (40 MG TOTAL) BY MOUTH AT BEDTIME. 90 tablet 1   pantoprazole (PROTONIX) 40 MG tablet Take 1 tablet (40 mg total) by mouth 2 (two) times daily. 180 tablet 5   varenicline (CHANTIX CONTINUING MONTH PAK) 1 MG tablet Take 1 tablet (1 mg total) by mouth 2 (two) times daily. 60 tablet 1   XIGDUO XR 02-999 MG TB24 Take 2 tablets by mouth daily before breakfast. 180 tablet 0   No facility-administered medications prior to visit.    No Known Allergies  Review of Systems  Psychiatric/Behavioral:  Positive for depression.       Objective:    Physical  Exam Constitutional:      General: She is not in acute distress.    Appearance: Normal appearance. She is well-developed.  HENT:     Head: Normocephalic and atraumatic.     Right Ear: External ear normal.     Left Ear: External ear normal.  Eyes:     General: No scleral icterus. Cardiovascular:     Heart sounds: No murmur heard. Pulmonary:  Effort: Pulmonary effort is normal.  Neurological:     Mental Status: She is alert and oriented to person, place, and time.  Psychiatric:        Mood and Affect: Mood normal.        Behavior: Behavior normal.        Thought Content: Thought content normal.        Judgment: Judgment normal.    There were no vitals taken for this visit. Wt Readings from Last 3 Encounters:  11/01/21 286 lb (129.7 kg)  09/05/21 268 lb (121.6 kg)  07/20/21 292 lb 15.9 oz (132.9 kg)    Diabetic Foot Exam - Simple   No data filed    Lab Results  Component Value Date   WBC 9.4 07/20/2021   HGB 13.6 07/20/2021   HCT 42.1 07/20/2021   PLT 295 07/20/2021   GLUCOSE 189 (H) 09/05/2021   CHOL 147 07/14/2020   TRIG 68 07/14/2020   HDL 48 (L) 07/14/2020   LDLCALC 85 07/14/2020   ALT 38 (H) 09/05/2021   AST 35 09/05/2021   NA 137 09/05/2021   K 4.4 09/05/2021   CL 104 09/05/2021   CREATININE 0.64 09/05/2021   BUN 10 09/05/2021   CO2 22 09/05/2021   TSH 0.837 12/22/2018   INR 1.02 06/25/2011   HGBA1C 7.8 (H) 09/05/2021    Lab Results  Component Value Date   TSH 0.837 12/22/2018   Lab Results  Component Value Date   WBC 9.4 07/20/2021   HGB 13.6 07/20/2021   HCT 42.1 07/20/2021   MCV 72.8 (L) 07/20/2021   PLT 295 07/20/2021   Lab Results  Component Value Date   NA 137 09/05/2021   K 4.4 09/05/2021   CO2 22 09/05/2021   GLUCOSE 189 (H) 09/05/2021   BUN 10 09/05/2021   CREATININE 0.64 09/05/2021   BILITOT 0.4 09/05/2021   ALKPHOS 93 09/05/2021   AST 35 09/05/2021   ALT 38 (H) 09/05/2021   PROT 6.5 09/05/2021   ALBUMIN 4.0 09/05/2021    CALCIUM 9.1 09/05/2021   ANIONGAP 12 07/20/2021   GFR 108.38 09/05/2021   Lab Results  Component Value Date   CHOL 147 07/14/2020   Lab Results  Component Value Date   HDL 48 (L) 07/14/2020   Lab Results  Component Value Date   LDLCALC 85 07/14/2020   Lab Results  Component Value Date   TRIG 68 07/14/2020   Lab Results  Component Value Date   CHOLHDL 3.1 07/14/2020   Lab Results  Component Value Date   HGBA1C 7.8 (H) 09/05/2021       Assessment & Plan:   Problem List Items Addressed This Visit       Unprioritized   Depression    Uncontrolled. Has done well on lexapro in the past. Will resume $RemoveBef'10mg'[id number]$ , 1/2 tab once daily for 1 week, then increase to a full tab once daily on week two. Encouraged pt to continue her counseling and follow up in 1 month for re-evaluation here.       Relevant Medications   escitalopram (LEXAPRO) 10 MG tablet     Meds ordered this encounter  Medications   escitalopram (LEXAPRO) 10 MG tablet    Sig: Take 1/2 tablet by mouth once daily for 1 week, then increase to a full tab once daily on week two.    Dispense:  30 tablet    Refill:  0    Order Specific Question:   Supervising  Provider    Answer:   Penni Homans A [[id number]]    I discussed the assessment and treatment plan with the patient. The patient was provided an opportunity to ask questions and all were answered. The patient agreed with the plan and demonstrated an understanding of the instructions.   The patient was advised to call back or seek an in-person evaluation if the symptoms worsen or if the condition fails to improve as anticipated.  I,Shehryar Multimedia programmer as a Education administrator for [employer], NP.,have documented all relevant documentation on the behalf of [name], NP,as directed by  [name], NP while in the presence of [name], NP.  I provided 20 minutes of face-to-face time during this encounter.   [name],  NP [employer] [facility] [employer] [phone] (phone) [phone] (fax)  [place]

## 2021-11-07 NOTE — Assessment & Plan Note (Signed)
Uncontrolled. Has done well on lexapro in the past. Will resume 10mg , 1/2 tab once daily for 1 week, then increase to a full tab once daily on week two. Encouraged pt to continue her counseling and follow up in 1 month for re-evaluation here.

## 2021-11-08 ENCOUNTER — Other Ambulatory Visit (HOSPITAL_BASED_OUTPATIENT_CLINIC_OR_DEPARTMENT_OTHER): Payer: Self-pay

## 2021-11-09 ENCOUNTER — Other Ambulatory Visit (HOSPITAL_BASED_OUTPATIENT_CLINIC_OR_DEPARTMENT_OTHER): Payer: Self-pay

## 2021-11-09 MED ORDER — TRULICITY 3 MG/0.5ML ~~LOC~~ SOAJ
SUBCUTANEOUS | 5 refills | Status: DC
  Filled 2021-11-09: qty 2, 28d supply, fill #0
  Filled 2021-12-05: qty 2, 28d supply, fill #1

## 2021-11-18 DIAGNOSIS — Z0279 Encounter for issue of other medical certificate: Secondary | ICD-10-CM

## 2021-12-05 ENCOUNTER — Other Ambulatory Visit: Payer: Self-pay | Admitting: Pharmacist

## 2021-12-05 ENCOUNTER — Other Ambulatory Visit: Payer: Self-pay | Admitting: Family

## 2021-12-06 ENCOUNTER — Other Ambulatory Visit: Payer: Self-pay

## 2021-12-06 ENCOUNTER — Other Ambulatory Visit (HOSPITAL_BASED_OUTPATIENT_CLINIC_OR_DEPARTMENT_OTHER): Payer: Self-pay

## 2021-12-06 MED ORDER — VARENICLINE TARTRATE 1 MG PO TABS
1.0000 mg | ORAL_TABLET | Freq: Two times a day (BID) | ORAL | 1 refills | Status: DC
  Filled 2021-12-06: qty 60, 30d supply, fill #0

## 2021-12-06 MED ORDER — ESCITALOPRAM OXALATE 10 MG PO TABS
ORAL_TABLET | ORAL | 0 refills | Status: DC
  Filled 2021-12-06: qty 30, 30d supply, fill #0

## 2021-12-07 ENCOUNTER — Ambulatory Visit: Payer: No Typology Code available for payment source | Admitting: Family

## 2021-12-11 ENCOUNTER — Other Ambulatory Visit (HOSPITAL_BASED_OUTPATIENT_CLINIC_OR_DEPARTMENT_OTHER): Payer: Self-pay

## 2021-12-12 LAB — HM DIABETES EYE EXAM

## 2021-12-14 ENCOUNTER — Encounter: Payer: Self-pay | Admitting: Family

## 2021-12-17 ENCOUNTER — Telehealth: Payer: Self-pay

## 2021-12-17 NOTE — Telephone Encounter (Signed)
Yes. Tks.

## 2021-12-17 NOTE — Telephone Encounter (Signed)
Pt needed to reschedule, I made a boo boo and told her 12/31/21 at 11:20 and that is a 40 minute slot can she please be scheduled

## 2021-12-21 ENCOUNTER — Encounter: Payer: No Typology Code available for payment source | Admitting: Family

## 2021-12-25 ENCOUNTER — Encounter: Payer: No Typology Code available for payment source | Admitting: Family

## 2021-12-26 ENCOUNTER — Telehealth: Payer: Self-pay | Admitting: *Deleted

## 2021-12-26 NOTE — Telephone Encounter (Signed)
Left patient a message to call the office to schedule annual. ?

## 2021-12-31 ENCOUNTER — Encounter: Payer: Self-pay | Admitting: Family

## 2021-12-31 ENCOUNTER — Ambulatory Visit (INDEPENDENT_AMBULATORY_CARE_PROVIDER_SITE_OTHER): Payer: No Typology Code available for payment source | Admitting: Family

## 2021-12-31 ENCOUNTER — Other Ambulatory Visit (HOSPITAL_BASED_OUTPATIENT_CLINIC_OR_DEPARTMENT_OTHER): Payer: Self-pay

## 2021-12-31 VITALS — BP 133/75 | HR 90 | Temp 98.0°F | Resp 16 | Ht 62.0 in | Wt 290.0 lb

## 2021-12-31 DIAGNOSIS — F32A Depression, unspecified: Secondary | ICD-10-CM

## 2021-12-31 DIAGNOSIS — Z0000 Encounter for general adult medical examination without abnormal findings: Secondary | ICD-10-CM

## 2021-12-31 DIAGNOSIS — K5903 Drug induced constipation: Secondary | ICD-10-CM | POA: Insufficient documentation

## 2021-12-31 DIAGNOSIS — R635 Abnormal weight gain: Secondary | ICD-10-CM

## 2021-12-31 DIAGNOSIS — E785 Hyperlipidemia, unspecified: Secondary | ICD-10-CM | POA: Diagnosis not present

## 2021-12-31 LAB — COMPREHENSIVE METABOLIC PANEL
ALT: 42 U/L — ABNORMAL HIGH (ref 0–35)
AST: 28 U/L (ref 0–37)
Albumin: 3.9 g/dL (ref 3.5–5.2)
Alkaline Phosphatase: 91 U/L (ref 39–117)
BUN: 9 mg/dL (ref 6–23)
CO2: 23 mEq/L (ref 19–32)
Calcium: 9 mg/dL (ref 8.4–10.5)
Chloride: 103 mEq/L (ref 96–112)
Creatinine, Ser: 0.65 mg/dL (ref 0.40–1.20)
GFR: 107.73 mL/min (ref >60.00)
Glucose, Bld: 136 mg/dL — ABNORMAL HIGH (ref 70–99)
Potassium: 4.1 mEq/L (ref 3.5–5.1)
Sodium: 136 mEq/L (ref 135–145)
Total Bilirubin: 0.4 mg/dL (ref 0.2–1.2)
Total Protein: 6.4 g/dL (ref 6.0–8.3)

## 2021-12-31 LAB — TSH: TSH: 2.24 u[IU]/mL (ref 0.35–5.50)

## 2021-12-31 LAB — LIPID PANEL
Cholesterol: 172 mg/dL (ref 0–200)
HDL: 53.7 mg/dL (ref >39.00)
LDL Cholesterol: 98 mg/dL (ref 0–99)
NonHDL: 118.48
Total CHOL/HDL Ratio: 3
Triglycerides: 103 mg/dL (ref 0.0–149.0)
VLDL: 20.6 mg/dL (ref 0.0–40.0)

## 2021-12-31 MED ORDER — ONDANSETRON HCL 4 MG PO TABS
4.0000 mg | ORAL_TABLET | Freq: Three times a day (TID) | ORAL | 0 refills | Status: DC | PRN
  Filled 2021-12-31: qty 30, 10d supply, fill #0

## 2021-12-31 MED ORDER — ESCITALOPRAM OXALATE 10 MG PO TABS: 10.0000 mg | ORAL_TABLET | Freq: Every day | ORAL | Status: DC

## 2021-12-31 NOTE — Assessment & Plan Note (Signed)
Depression screen [facility] 2/9 12/31/2021 09/05/2021 04/06/2021 12/08/2020 04/04/2020  ?Decreased Interest '2 1 2 2 2  '$ ?Down, Depressed, Hopeless '1 1 2 1 1  '$ ?PHQ - 2 Score '3 2 4 3 3  '$ ?Altered sleeping '1 1 2 '$ 0 3  ?Tired, decreased energy '2 1 3 2 2  '$ ?Change in appetite 2 0 '2 1 2  '$ ?Feeling bad or failure about yourself  2 0 2 0 0  ?Trouble concentrating 0 0 0 1 1  ?Moving slowly or fidgety/restless 0 0 1 0 0  ?Suicidal thoughts 0 0 0 0 0  ?PHQ-9 Score '10 4 14 7 11  '$ ?Difficult doing work/chores - - - - Somewhat difficult  ? ?Uncontrolled. Will increase lexapro from '5mg'$  to '10mg'$ .  ?

## 2021-12-31 NOTE — Assessment & Plan Note (Addendum)
New. Constipation as well as nausea likely side effect of Trulicity. I advised her to discuss this with her endocrinologist. In the meantime, discussed high fiber diet, exercise, increase water intake and add prn miralax. Continue zofran prn.  ?

## 2021-12-31 NOTE — Patient Instructions (Addendum)
Add Miralax once daily as needed for constipation. ?Please get your bivalent covid booster.  ?Stop chantix.  ?Great job quitting smoking! ?

## 2021-12-31 NOTE — Progress Notes (Signed)
Subjective:   By signing my name below, I, Elizabeth [name], attest that this documentation has been prepared under the direction and in the presence of Elizabeth Alar, NP 12/31/2021        Patient ID: Elizabeth [name], female    DOB: [birth year], 46 y.o.   MRN: [id number]  Chief Complaint  Patient presents with   Annual Exam    HPI Patient is in today for a comprehensive physical exam.  Depression- She restarted lexapro after her last visit because her depression was worsening. She is doing well on it and did not increase it to 10 mg.  Nausea/constipation- nausea and abdominal pain. She is also very constipated and has bowel movements every 3 days. She has not used miralax. It started before she started lexapro but has gotten worse recently. She uses 4 mg zofran prn and it provides great relief. She is not interested in surgery. Mammogram- Last checked on 06/21/2021. Results were normal. Repeat in 1 year. Pap smear- Last checked on 12/25/2020. Results were normal. Repeat in 3-5 years.  Immunizations- She has received the flu, pneumonia and tetanus vaccines. She has 3 Covid-19 vaccines and is going to receive the booster vaccine.  Diet and Exercise- She exercises by walking. Her weight has increased. She does not eat often but when she eats, she eats large portions. She drinks only water. She is interested in bariatric surgery. Wt Readings from Last 3 Encounters:  12/31/21 290 lb (131.5 kg)  11/01/21 286 lb (129.7 kg)  09/05/21 268 lb (121.6 kg)    Social History- She does not drink alcohol. She stopped smoking cigarettes and is using chantix to manage the cravings.  Vision and Dental- She is UTD on dental and vision.  Her mother recently had a peripheral artery surgery.   She denies having any unexpected weight change, ear pain, hearing loss and rhinorrhea, visual disturbance, cough, chest pain and leg swelling, vomiting, diarrhea and blood in stool, or dysuria and frequency, for  myalgias and arthralgias, rash, headaches, adenopathy, depression or anxiety at this time   Past Medical History:  Diagnosis Date   Anemia    Arthritis of left knee    Astigmatism    Depression    Diabetes mellitus without complication (HCC)    Dry mouth    Fatigue    GERD (gastroesophageal reflux disease)    Heartburn    High cholesterol    Hypertension    Keratoconjunctivitis    Muscle pain    Vitamin D deficiency    Weakness     Past Surgical History:  Procedure Laterality Date   BIOPSY  01/18/2021   Procedure: BIOPSY;  Surgeon: Lavena Bullion, DO;  [facility];  Service: Gastroenterology;;   CHOLECYSTECTOMY N/A 05/11/2021   Procedure: LAPAROSCOPIC CHOLECYSTECTOMY WITH INTRAOPERATIVE CHOLANGIOGRAM;  Surgeon: Felicie Morn, MD;  Location: [facility];  Service: General;  Laterality: N/A;   DILATION AND CURETTAGE OF UTERUS     ENDOMETRIAL BIOPSY  12/19/2017   normal per pt   ESOPHAGOGASTRODUODENOSCOPY (EGD) WITH PROPOFOL N/A 01/18/2021   Procedure: ESOPHAGOGASTRODUODENOSCOPY (EGD) WITH PROPOFOL;  Surgeon: Lavena Bullion, DO;  [facility];  Service: Gastroenterology;  Laterality: N/A;   TONSILLECTOMY     TUBAL LIGATION      Family History  Problem Relation Age of Onset   Diabetes Mother    Hypertension Mother    Hyperlipidemia Mother    Obesity Mother    Peripheral Artery Disease Mother  HIV Father        died from complications (IVDU)   Alcohol abuse Father    Drug abuse Father    Ovarian cancer Maternal Grandmother    Colon cancer Neg Hx    Esophageal cancer Neg Hx    Liver disease Neg Hx    Pancreatic cancer Neg Hx    Stomach cancer Neg Hx     Social History   Socioeconomic History   Marital status: Married    Spouse name: Elizabeth [name]   Number of children: 2   Years of education: Not on file   Highest education level: Not on file  Occupational History   Occupation: [place]    Employer: [employer]  Tobacco  Use   Smoking status: Former    Packs/day: 0.50    Years: 3.00    Pack years: 1.50    Types: Cigarettes    Quit date: 11/14/2021    Years since quitting: 0.1   Smokeless tobacco: Never   Tobacco comments:    4-5/day  Vaping Use   Vaping Use: Never used  Substance and Sexual Activity   Alcohol use: No   Drug use: No   Sexual activity: Yes    Partners: Male    Birth control/protection: Surgical, Implant  Other Topics Concern   Not on file  Social History Narrative    2 children   1997- son Elizabeth [name]   2000- son Insurance claims handler in [place] in [place]   Enjoys reading   Widowed August 09, 2023, husband died from COVID-47.   Social Determinants of Health   Financial Resource Strain: Not on file  Food Insecurity: Not on file  Transportation Needs: Not on file  Physical Activity: Not on file  Stress: Not on file  Social Connections: Not on file  Intimate Partner Violence: Not on file    Outpatient Medications Prior to Visit  Medication Sig Dispense Refill   amLODipine (NORVASC) 10 MG tablet TAKE 1 TABLET BY MOUTH ONCE DAILY 90 tablet 0   COVID-19 At Home Antigen Test ([facility] COVID-19 HOME TEST) KIT Use as directed. 2 kit 0   Dapagliflozin-metFORMIN HCl ER (XIGDUO XR) 02-999 MG TB24 TAKE 2 TABLETS BY MOUTH DAILY BEFORE BREAKFAST. (Patient taking differently: Take 2 tablets by mouth daily.) 180 tablet 1   etonogestrel (NEXPLANON) 68 MG IMPL implant 1 each by Subdermal route once.     famotidine (PEPCID) 20 MG tablet Take 1 tablet (20 mg total) by mouth daily. 90 tablet 3   hydrOXYzine (ATARAX) 25 MG tablet Take 2 tablets (50 mg total) by mouth at bedtime as needed for insomnia. 60 tablet 3   lisinopril (ZESTRIL) 40 MG tablet TAKE 1 TABLET BY MOUTH ONCE DAILY 90 tablet 0   lovastatin (MEVACOR) 40 MG tablet TAKE 1 TABLET (40 MG TOTAL) BY MOUTH AT BEDTIME. 90 tablet 1   pantoprazole (PROTONIX) 40 MG tablet Take 1 tablet (40 mg total) by mouth 2 (two) times daily. 254  tablet 5   TRULICITY 3 YH/0.6CB SOPN Inject 3 mg under the skin once a week 2 mL 5   XIGDUO XR 02-999 MG TB24 Take 2 tablets by mouth daily before breakfast. 180 tablet 0   escitalopram (LEXAPRO) 5 MG tablet Take 5 mg by mouth daily.     varenicline (CHANTIX CONTINUING MONTH PAK) 1 MG tablet Take 1 tablet (1 mg total) by mouth 2 (two) times daily. 60 tablet 1   escitalopram (LEXAPRO) 10 MG  tablet Take 1 tablet by mouth daily 30 tablet 0   No facility-administered medications prior to visit.    No Known Allergies  Review of Systems  Constitutional:  Negative for fever.  HENT:  Negative for ear pain and hearing loss.        (-)nystagmus (-)adenopathy  Eyes:  Negative for blurred vision.  Respiratory:  Negative for cough, shortness of breath and wheezing.   Cardiovascular:  Negative for chest pain and leg swelling.  Gastrointestinal:  Positive for abdominal pain, constipation and nausea. Negative for blood in stool, diarrhea and vomiting.  Genitourinary:  Negative for dysuria and frequency.  Musculoskeletal:  Negative for joint pain and myalgias.  Skin:  Negative for rash.  Neurological:  Negative for headaches.  Psychiatric/Behavioral:  Negative for depression. The patient is not nervous/anxious.       Objective:    Physical Exam Constitutional:      General: She is not in acute distress.    Appearance: Normal appearance. She is not ill-appearing.  HENT:     Head: Normocephalic and atraumatic.     Right Ear: Tympanic membrane, ear canal and external ear normal.     Left Ear: Tympanic membrane, ear canal and external ear normal.  Eyes:     Extraocular Movements: Extraocular movements intact.     Right eye: No nystagmus.     Left eye: No nystagmus.     Pupils: Pupils are equal, round, and reactive to light.  Cardiovascular:     Rate and Rhythm: Normal rate and regular rhythm.     Pulses: Normal pulses.     Heart sounds: Normal heart sounds. No murmur heard. Pulmonary:      Effort: Pulmonary effort is normal. No respiratory distress.     Breath sounds: Normal breath sounds. No wheezing or rhonchi.  Abdominal:     General: Bowel sounds are normal. There is no distension.     Palpations: Abdomen is soft.     Tenderness: There is no abdominal tenderness. There is no guarding or rebound.  Musculoskeletal:     Cervical back: Neck supple.     Comments: 5/5 strength in upper and lower extremities  Feet:     Comments: Diabetic Foot Exam - Simple   No data filed    Lymphadenopathy:     Cervical: No cervical adenopathy.  Skin:    General: Skin is warm and dry.  Neurological:     Mental Status: She is alert and oriented to person, place, and time.  Psychiatric:        Behavior: Behavior normal.        Judgment: Judgment normal.    BP 133/75 (BP Location: Right Arm, Patient Position: Sitting, Cuff Size: Large)    Pulse 90    Temp 98 F (36.7 C) (Oral)    Resp 16    Ht 5' 2" (1.575 m)    Wt 290 lb (131.5 kg)    SpO2 100%    BMI 53.04 kg/m  Wt Readings from Last 3 Encounters:  12/31/21 290 lb (131.5 kg)  11/01/21 286 lb (129.7 kg)  09/05/21 268 lb (121.6 kg)    Diabetic Foot Exam - Simple   Simple Foot Form Diabetic Foot exam was performed with the following findings: Yes 12/31/2021 11:49 AM  Visual Inspection No deformities, no ulcerations, no other skin breakdown bilaterally: Yes Sensation Testing Intact to touch and monofilament testing bilaterally: Yes Pulse Check Posterior Tibialis and Dorsalis pulse intact bilaterally: Yes Comments  Lab Results  Component Value Date   WBC 9.4 07/20/2021   HGB 13.6 07/20/2021   HCT 42.1 07/20/2021   PLT 295 07/20/2021   GLUCOSE 189 (H) 09/05/2021   CHOL 147 07/14/2020   TRIG 68 07/14/2020   HDL 48 (L) 07/14/2020   LDLCALC 85 07/14/2020   ALT 38 (H) 09/05/2021   AST 35 09/05/2021   NA 137 09/05/2021   K 4.4 09/05/2021   CL 104 09/05/2021   CREATININE 0.64 09/05/2021   BUN 10 09/05/2021   CO2 22  09/05/2021   TSH 0.837 12/22/2018   INR 1.02 06/25/2011   HGBA1C 7.8 (H) 09/05/2021    Lab Results  Component Value Date   TSH 0.837 12/22/2018   Lab Results  Component Value Date   WBC 9.4 07/20/2021   HGB 13.6 07/20/2021   HCT 42.1 07/20/2021   MCV 72.8 (L) 07/20/2021   PLT 295 07/20/2021   Lab Results  Component Value Date   NA 137 09/05/2021   K 4.4 09/05/2021   CO2 22 09/05/2021   GLUCOSE 189 (H) 09/05/2021   BUN 10 09/05/2021   CREATININE 0.64 09/05/2021   BILITOT 0.4 09/05/2021   ALKPHOS 93 09/05/2021   AST 35 09/05/2021   ALT 38 (H) 09/05/2021   PROT 6.5 09/05/2021   ALBUMIN 4.0 09/05/2021   CALCIUM 9.1 09/05/2021   ANIONGAP 12 07/20/2021   GFR 108.38 09/05/2021   Lab Results  Component Value Date   CHOL 147 07/14/2020   Lab Results  Component Value Date   HDL 48 (L) 07/14/2020   Lab Results  Component Value Date   LDLCALC 85 07/14/2020   Lab Results  Component Value Date   TRIG 68 07/14/2020   Lab Results  Component Value Date   CHOLHDL 3.1 07/14/2020   Lab Results  Component Value Date   HGBA1C 7.8 (H) 09/05/2021       Assessment & Plan:   Problem List Items Addressed This Visit       Unprioritized   Preventative health care    Wt Readings from Last 3 Encounters:  12/31/21 290 lb (131.5 kg)  11/01/21 286 lb (129.7 kg)  09/05/21 268 lb (121.6 kg)  Discussed diet/exercise/weight loss. Pap/mammo up to date. Recommended bivalent covid booster.      Hyperlipidemia   Relevant Orders   Comp Met (CMET)   Lipid panel   Drug-induced constipation    New. Constipation as well as nausea likely side effect of Trulicity. I advised her to discuss this with her endocrinologist. In the meantime, discussed high fiber diet, exercise, increase water intake and add prn miralax.      Depression    Depression screen [facility] 2/9 12/31/2021 09/05/2021 04/06/2021 12/08/2020 04/04/2020  Decreased Interest _0 Down, Depressed, Hopeless _1 PHQ - 2 Score _2 Altered sleeping _3 0 3  Tired, decreased energy _4 Change in appetite 2 0 _5 Feeling bad or failure about yourself  2 0 2 0 0  Trouble concentrating 0 0 0 1 1  Moving slowly or fidgety/restless 0 0 1 0 0  Suicidal thoughts 0 0 0 0 0  PHQ-9 Score _6 Difficult doing work/chores - - - - Somewhat difficult  Uncontrolled. Will increase lexapro from 35m to 119m       Relevant Medications  escitalopram (LEXAPRO) 10 MG tablet   Other Visit Diagnoses     Morbid obesity ([place])    -  Primary   Relevant Orders   Amb Referral to Bariatric Surgery   Weight gain       Relevant Orders   TSH       Meds ordered this encounter  Medications   ondansetron (ZOFRAN) 4 MG tablet    Sig: Take 1 tablet (4 mg total) by mouth every 8 (eight) hours as needed for nausea or vomiting.    Dispense:  30 tablet    Refill:  0    Order Specific Question:   Supervising Provider    Answer:   Penni Homans A [[id number]]   escitalopram (LEXAPRO) 10 MG tablet    Sig: Take 1 tablet (10 mg total) by mouth daily.    Order Specific Question:   Supervising Provider    Answer:   Penni Homans A [[id number]]    I,Elizabeth [name],acting as a scribe for [name], NP.,have documented all relevant documentation on the behalf of [name], NP,as directed by  [name], NP while in the presence of [name], NP.   I, Elizabeth Alar, NP, personally preformed the services described in this documentation.  All medical record entries made by the scribe were at my direction and in my presence.  I have reviewed the chart and discharge instructions (if applicable) and agree that the record reflects my personal performance and is accurate and complete. 12/31/2021

## 2021-12-31 NOTE — Assessment & Plan Note (Addendum)
Wt Readings from Last 3 Encounters:  ?12/31/21 290 lb (131.5 kg)  ?11/01/21 286 lb (129.7 kg)  ?09/05/21 268 lb (121.6 kg)  ? ?Discussed diet/exercise/weight loss. Pap/mammo up to date. Recommended bivalent covid booster. ?

## 2022-01-07 ENCOUNTER — Other Ambulatory Visit (HOSPITAL_BASED_OUTPATIENT_CLINIC_OR_DEPARTMENT_OTHER): Payer: Self-pay

## 2022-01-07 ENCOUNTER — Encounter: Payer: Self-pay | Admitting: Family

## 2022-01-07 MED ORDER — TRULICITY 1.5 MG/0.5ML ~~LOC~~ SOAJ
SUBCUTANEOUS | 5 refills | Status: DC
  Filled 2022-01-07: qty 2, 28d supply, fill #0
  Filled 2022-02-04: qty 2, 28d supply, fill #1
  Filled 2022-03-04: qty 2, 28d supply, fill #2
  Filled 2022-04-02: qty 2, 28d supply, fill #3
  Filled 2022-05-06 – 2022-05-08 (×2): qty 2, 28d supply, fill #4
  Filled 2022-06-02: qty 2, 28d supply, fill #5

## 2022-01-09 NOTE — Telephone Encounter (Signed)
See letter I wrote today. Can you please fax to bariatric coordinator? (See media attachments for contact info) ?

## 2022-01-15 ENCOUNTER — Other Ambulatory Visit: Payer: Self-pay | Admitting: Family

## 2022-01-15 ENCOUNTER — Other Ambulatory Visit (HOSPITAL_COMMUNITY): Payer: Self-pay

## 2022-01-15 MED ORDER — AMLODIPINE BESYLATE 10 MG PO TABS
ORAL_TABLET | Freq: Every day | ORAL | 1 refills | Status: DC
  Filled 2022-01-15: qty 90, 90d supply, fill #0
  Filled 2022-04-26: qty 90, 90d supply, fill #1

## 2022-01-15 MED ORDER — LISINOPRIL 40 MG PO TABS
ORAL_TABLET | Freq: Every day | ORAL | 1 refills | Status: DC
  Filled 2022-01-15: qty 90, 90d supply, fill #0
  Filled 2022-04-26: qty 90, 90d supply, fill #1

## 2022-01-22 ENCOUNTER — Other Ambulatory Visit (HOSPITAL_COMMUNITY): Payer: Self-pay

## 2022-01-22 MED ORDER — XIGDUO XR 5-1000 MG PO TB24
ORAL_TABLET | ORAL | 0 refills | Status: DC
  Filled 2022-01-22: qty 180, 90d supply, fill #0

## 2022-01-23 ENCOUNTER — Other Ambulatory Visit (HOSPITAL_COMMUNITY): Payer: Self-pay

## 2022-01-30 ENCOUNTER — Encounter: Payer: Self-pay | Admitting: Family

## 2022-02-04 ENCOUNTER — Other Ambulatory Visit (HOSPITAL_COMMUNITY): Payer: Self-pay

## 2022-02-07 ENCOUNTER — Other Ambulatory Visit: Payer: Self-pay | Admitting: Surgery

## 2022-02-07 ENCOUNTER — Other Ambulatory Visit (HOSPITAL_COMMUNITY): Payer: Self-pay | Admitting: Surgery

## 2022-02-07 DIAGNOSIS — Z01818 Encounter for other preprocedural examination: Secondary | ICD-10-CM

## 2022-02-08 ENCOUNTER — Other Ambulatory Visit (HOSPITAL_BASED_OUTPATIENT_CLINIC_OR_DEPARTMENT_OTHER): Payer: No Typology Code available for payment source

## 2022-02-08 ENCOUNTER — Encounter (HOSPITAL_BASED_OUTPATIENT_CLINIC_OR_DEPARTMENT_OTHER): Payer: Self-pay

## 2022-02-08 ENCOUNTER — Ambulatory Visit (INDEPENDENT_AMBULATORY_CARE_PROVIDER_SITE_OTHER): Payer: No Typology Code available for payment source

## 2022-02-08 DIAGNOSIS — Z6841 Body Mass Index (BMI) 40.0 and over, adult: Secondary | ICD-10-CM | POA: Diagnosis not present

## 2022-02-08 DIAGNOSIS — Z01818 Encounter for other preprocedural examination: Secondary | ICD-10-CM

## 2022-02-11 ENCOUNTER — Ambulatory Visit: Payer: No Typology Code available for payment source | Admitting: Obstetrics & Gynecology

## 2022-02-22 ENCOUNTER — Ambulatory Visit (INDEPENDENT_AMBULATORY_CARE_PROVIDER_SITE_OTHER): Payer: No Typology Code available for payment source | Admitting: Family

## 2022-02-22 VITALS — BP 148/84 | HR 84 | Temp 99.0°F | Resp 16 | Wt 301.0 lb

## 2022-02-22 DIAGNOSIS — M25562 Pain in left knee: Secondary | ICD-10-CM

## 2022-02-22 NOTE — Patient Instructions (Signed)
Please stop ibuprofen. Apply voltaren gel twice daily to left knee as needed.  ?For breakthrough pain, you may use tylenol as needed. ?Continue your work on weight loss. ?Swimming and stationary bike are great ways to exercise when you have knee pain.  ?Call if pain worsens or if pain does not improve with above recommendations.  ?

## 2022-02-22 NOTE — Progress Notes (Signed)
? ?Subjective:  ? ?By signing my name below, I, [name], attest that this documentation has been prepared under the direction and in the presence of [name] NP, 02/22/2022  ? ? Patient ID: Elizabeth [name], female    DOB: 01-Mar-1976, 46 y.o.   MRN: [id number] ? ?Chief Complaint  ?Patient presents with  ? Knee Pain  ?  Patient complains of left knee pain with out injury.  ? ? ?HPI ?Patient is in today for an office visit ? ?Left Knee Pain - She complains of left knee pain. Usually the pain comes and goes but this time the pain has been consistent. She uses Ibuprofen which eases up the pain some. She also uses a heating pad. She reports that she wears good supporting shoes.  ? ?Bariatric surgery - She is in process of getting bariatric surgery. She believes that she will be ready to receive surgery in about 6 months  ? ?Health Maintenance Due  ?Topic Date Due  ? COVID-19 Vaccine (4 - Booster for Pfizer series) 11/03/2020  ? ? ?Past Medical History:  ?Diagnosis Date  ? Anemia   ? Arthritis of left knee   ? Astigmatism   ? Depression   ? Diabetes mellitus without complication ([place])   ? Dry mouth   ? Fatigue   ? GERD (gastroesophageal reflux disease)   ? Heartburn   ? High cholesterol   ? Hypertension   ? Keratoconjunctivitis   ? Muscle pain   ? Vitamin D deficiency   ? Weakness   ? ? ?Past Surgical History:  ?Procedure Laterality Date  ? BIOPSY  01/18/2021  ? Procedure: BIOPSY;  Surgeon: Lavena Bullion, DO;  [facility];  Service: Gastroenterology;;  ? CHOLECYSTECTOMY N/A 05/11/2021  ? Procedure: LAPAROSCOPIC CHOLECYSTECTOMY WITH INTRAOPERATIVE CHOLANGIOGRAM;  Surgeon: Felicie Morn, MD;  Location: [facility];  Service: General;  Laterality: N/A;  ? DILATION AND CURETTAGE OF UTERUS    ? ENDOMETRIAL BIOPSY  12/19/2017  ? normal per pt  ? ESOPHAGOGASTRODUODENOSCOPY (EGD) WITH PROPOFOL N/A 01/18/2021  ? Procedure: ESOPHAGOGASTRODUODENOSCOPY (EGD) WITH PROPOFOL;  Surgeon: Lavena Bullion,  DO;  [facility];  Service: Gastroenterology;  Laterality: N/A;  ? TONSILLECTOMY    ? TUBAL LIGATION    ? ? ?Family History  ?Problem Relation Age of Onset  ? Diabetes Mother   ? Hypertension Mother   ? Hyperlipidemia Mother   ? Obesity Mother   ? Peripheral Artery Disease Mother   ? HIV Father   ?     died from complications (IVDU)  ? Alcohol abuse Father   ? Drug abuse Father   ? Ovarian cancer Maternal Grandmother   ? Colon cancer Neg Hx   ? Esophageal cancer Neg Hx   ? Liver disease Neg Hx   ? Pancreatic cancer Neg Hx   ? Stomach cancer Neg Hx   ? ? ?Social History  ? ?Socioeconomic History  ? Marital status: Married  ?  Spouse name: [name]  ? Number of children: 2  ? Years of education: Not on file  ? Highest education level: Not on file  ?Occupational History  ? Occupation: [employer]  ?  Employer: [employer]  ?Tobacco Use  ? Smoking status: Former  ?  Packs/day: 0.50  ?  Years: 3.00  ?  Pack years: 1.50  ?  Types: Cigarettes  ?  Quit date: 11/14/2021  ?  Years since quitting: 0.2  ? Smokeless tobacco: Never  ?  Tobacco comments:  ?  4-5/day  ?Vaping Use  ? Vaping Use: Never used  ?Substance and Sexual Activity  ? Alcohol use: No  ? Drug use: No  ? Sexual activity: Yes  ?  Partners: Male  ?  Birth control/protection: Surgical, Implant  ?Other Topics Concern  ? Not on file  ?Social History Narrative  ?  2 children  ? 70- son Allen Kell  ? 47- son Lincoln Brigham  ? [employer] in [place] in [place]  ? Enjoys reading  ? Widowed July 23, 2023, husband died from COVID-70.  ? ?Social Determinants of Health  ? ?Financial Resource Strain: Not on file  ?Food Insecurity: Not on file  ?Transportation Needs: Not on file  ?Physical Activity: Not on file  ?Stress: Not on file  ?Social Connections: Not on file  ?Intimate Partner Violence: Not on file  ? ? ?Outpatient Medications Prior to Visit  ?Medication Sig Dispense Refill  ? amLODipine (NORVASC) 10 MG tablet TAKE 1 TABLET BY MOUTH ONCE DAILY 90  tablet 1  ? COVID-19 At Home Antigen Test [facility] COVID-19 HOME TEST) KIT Use as directed. 2 kit 0  ? Dapagliflozin-metFORMIN HCl ER (XIGDUO XR) 02-999 MG TB24 TAKE 2 TABLETS BY MOUTH DAILY BEFORE BREAKFAST. (Patient taking differently: Take 2 tablets by mouth daily.) 180 tablet 1  ? escitalopram (LEXAPRO) 10 MG tablet Take 1 tablet (10 mg total) by mouth daily.    ? etonogestrel (NEXPLANON) 68 MG IMPL implant 1 each by Subdermal route once.    ? famotidine (PEPCID) 20 MG tablet Take 1 tablet (20 mg total) by mouth daily. 90 tablet 3  ? hydrOXYzine (ATARAX) 25 MG tablet Take 2 tablets (50 mg total) by mouth at bedtime as needed for insomnia. 60 tablet 3  ? lisinopril (ZESTRIL) 40 MG tablet TAKE 1 TABLET BY MOUTH ONCE DAILY 90 tablet 1  ? lovastatin (MEVACOR) 40 MG tablet TAKE 1 TABLET (40 MG TOTAL) BY MOUTH AT BEDTIME. 90 tablet 1  ? ondansetron (ZOFRAN) 4 MG tablet Take 1 tablet (4 mg total) by mouth every 8 (eight) hours as needed for nausea or vomiting. 30 tablet 0  ? pantoprazole (PROTONIX) 40 MG tablet Take 1 tablet (40 mg total) by mouth 2 (two) times daily. 180 tablet 5  ? TRULICITY 1.5 QZ/3.0QT SOPN Inject 1.5 mg under the skin once a week - STOP the 3.0 mg pens 2 mL 5  ? XIGDUO XR 02-999 MG TB24 Take 2 tablets by mouth daily before breakfast. 180 tablet 0  ? ?No facility-administered medications prior to visit.  ? ? ?No Known Allergies ? ?Review of Systems  ?Musculoskeletal:  Positive for joint pain (Left Knee).  ? ?   ?Objective:  ?  ?Physical Exam ?Constitutional:   ?   General: She is not in acute distress. ?   Appearance: Normal appearance. She is not ill-appearing.  ?HENT:  ?   Head: Normocephalic and atraumatic.  ?   Right Ear: External ear normal.  ?   Left Ear: External ear normal.  ?Eyes:  ?   Extraocular Movements: Extraocular movements intact.  ?   Pupils: Pupils are equal, round, and reactive to light.  ?Cardiovascular:  ?   Rate and Rhythm: Normal rate and regular rhythm.  ?   Heart sounds:  Normal heart sounds. No murmur heard. ?  No gallop.  ?Pulmonary:  ?   Effort: Pulmonary effort is normal. No respiratory distress.  ?   Breath sounds: Normal breath sounds. No wheezing  or rales.  ?Musculoskeletal:  ?   Left knee: Crepitus present. No swelling.  ?Skin: ?   General: Skin is warm and dry.  ?Neurological:  ?   Mental Status: She is alert and oriented to person, place, and time.  ?Psychiatric:     ?   Mood and Affect: Mood normal.     ?   Behavior: Behavior normal.     ?   Judgment: Judgment normal.  ? ? ?BP (!) 148/84 (BP Location: Right Arm, Patient Position: Sitting, Cuff Size: Large)   Pulse 84   Temp 99 ?F (37.2 ?C) (Oral)   Resp 16   Wt (!) 301 lb (136.5 kg)   SpO2 100%   BMI 55.05 kg/m?  ?Wt Readings from Last 3 Encounters:  ?02/22/22 (!) 301 lb (136.5 kg)  ?12/31/21 290 lb (131.5 kg)  ?11/01/21 286 lb (129.7 kg)  ? ? ?   ?Assessment & Plan:  ? ?Problem List Items Addressed This Visit   ? ?  ? Unprioritized  ? Left knee pain - Primary  ?  New. Pt is advised as follows: ? ?Please stop ibuprofen. Apply voltaren gel twice daily to left knee as needed.  ?For breakthrough pain, you may use tylenol as needed. ?Continue your work on weight loss. ?Swimming and stationary bike are great ways to exercise when you have knee pain.  ?Call if pain worsens or if pain does not improve with above recommendations.  ?  ?  ? ? ? ? ?No orders of the defined types were placed in this encounter. ? ? ?I, [name], NP, personally preformed the services described in this documentation.  All medical record entries made by the scribe were at my direction and in my presence.  I have reviewed the chart and discharge instructions (if applicable) and agree that the record reflects my personal performance and is accurate and complete. 02/22/2022 ? ? ?I,Amber Collins,acting as a Education administrator for [employer], NP.,have documented all relevant documentation on the behalf of [name], NP,as directed by   [name], NP while in the presence of [name], NP. ? ? ? ?[name], NP ? ?

## 2022-02-22 NOTE — Assessment & Plan Note (Signed)
New. Pt is advised as follows: ? ?Please stop ibuprofen. Apply voltaren gel twice daily to left knee as needed.  ?For breakthrough pain, you may use tylenol as needed. ?Continue your work on weight loss. ?Swimming and stationary bike are great ways to exercise when you have knee pain.  ?Call if pain worsens or if pain does not improve with above recommendations.  ?

## 2022-02-27 LAB — VITAMIN B12: Vitamin B-12: 356

## 2022-02-27 LAB — BASIC METABOLIC PANEL
Chloride: 101 (ref 99–108)
Creatinine: 0.8 (ref 0.5–1.1)
Glucose: 217
Potassium: 4.8 mEq/L (ref 3.5–5.1)
Sodium: 137 (ref 137–147)

## 2022-02-27 LAB — CBC AND DIFFERENTIAL
HCT: 42 (ref 36–46)
Hemoglobin: 12.9 (ref 12.0–16.0)
WBC: 6

## 2022-02-27 LAB — TSH: TSH: 1.47 (ref 0.41–5.90)

## 2022-02-27 LAB — HEMOGLOBIN A1C: Hemoglobin A1C: 9.5

## 2022-02-27 LAB — COMPREHENSIVE METABOLIC PANEL
Calcium: 9.4 (ref 8.7–10.7)
eGFR: 97

## 2022-02-27 LAB — VITAMIN D 25 HYDROXY (VIT D DEFICIENCY, FRACTURES): Vit D, 25-Hydroxy: 23

## 2022-02-27 LAB — LIPID PANEL
Cholesterol: 194 (ref 0–200)
HDL: 56 (ref 35–70)
LDL Cholesterol: 117
Triglycerides: 100 (ref 40–160)

## 2022-02-27 LAB — CBC: RBC: 5.65 — AB (ref 3.87–5.11)

## 2022-03-04 ENCOUNTER — Other Ambulatory Visit: Payer: Self-pay | Admitting: Gastroenterology

## 2022-03-04 ENCOUNTER — Other Ambulatory Visit: Payer: Self-pay | Admitting: Family

## 2022-03-04 ENCOUNTER — Other Ambulatory Visit (HOSPITAL_COMMUNITY): Payer: Self-pay

## 2022-03-04 MED ORDER — PANTOPRAZOLE SODIUM 40 MG PO TBEC
40.0000 mg | DELAYED_RELEASE_TABLET | Freq: Two times a day (BID) | ORAL | 0 refills | Status: DC
  Filled 2022-03-04: qty 180, 90d supply, fill #0

## 2022-03-04 MED ORDER — HYDROXYZINE HCL 25 MG PO TABS
50.0000 mg | ORAL_TABLET | Freq: Every evening | ORAL | 3 refills | Status: DC | PRN
  Filled 2022-03-04: qty 60, 30d supply, fill #0
  Filled 2022-04-02: qty 60, 30d supply, fill #1
  Filled 2022-05-06 – 2022-07-02 (×2): qty 60, 30d supply, fill #2
  Filled 2022-08-06: qty 60, 30d supply, fill #3

## 2022-03-04 MED ORDER — LOVASTATIN 40 MG PO TABS
ORAL_TABLET | Freq: Every day | ORAL | 1 refills | Status: DC
  Filled 2022-03-04: qty 90, 90d supply, fill #0
  Filled 2022-07-08 – 2022-08-06 (×2): qty 90, 90d supply, fill #1

## 2022-03-05 ENCOUNTER — Other Ambulatory Visit (HOSPITAL_COMMUNITY): Payer: Self-pay

## 2022-03-05 MED ORDER — ESCITALOPRAM OXALATE 10 MG PO TABS
10.0000 mg | ORAL_TABLET | Freq: Every day | ORAL | 0 refills | Status: DC
  Filled 2022-03-05: qty 90, 90d supply, fill #0

## 2022-03-06 ENCOUNTER — Other Ambulatory Visit (HOSPITAL_COMMUNITY): Payer: Self-pay

## 2022-03-12 ENCOUNTER — Ambulatory Visit: Payer: No Typology Code available for payment source | Admitting: Skilled Nursing Facility1

## 2022-03-19 ENCOUNTER — Other Ambulatory Visit (HOSPITAL_BASED_OUTPATIENT_CLINIC_OR_DEPARTMENT_OTHER): Payer: Self-pay

## 2022-03-19 ENCOUNTER — Encounter (INDEPENDENT_AMBULATORY_CARE_PROVIDER_SITE_OTHER): Payer: No Typology Code available for payment source | Admitting: Family

## 2022-03-19 DIAGNOSIS — M179 Osteoarthritis of knee, unspecified: Secondary | ICD-10-CM | POA: Diagnosis not present

## 2022-03-19 MED ORDER — MELOXICAM 7.5 MG PO TABS
7.5000 mg | ORAL_TABLET | Freq: Every day | ORAL | 1 refills | Status: DC | PRN
  Filled 2022-03-19: qty 30, 30d supply, fill #0
  Filled 2022-04-26: qty 30, 30d supply, fill #1

## 2022-03-19 NOTE — Telephone Encounter (Signed)

## 2022-04-02 ENCOUNTER — Other Ambulatory Visit (HOSPITAL_COMMUNITY): Payer: Self-pay

## 2022-04-04 ENCOUNTER — Other Ambulatory Visit (HOSPITAL_BASED_OUTPATIENT_CLINIC_OR_DEPARTMENT_OTHER): Payer: Self-pay

## 2022-04-04 MED ORDER — CHANTIX STARTING MONTH PAK 0.5 MG X 11 & 1 MG X 42 PO TBPK
ORAL_TABLET | ORAL | 1 refills | Status: DC
  Filled 2022-04-04: qty 53, 30d supply, fill #0
  Filled 2022-04-26: qty 53, 28d supply, fill #0

## 2022-04-05 ENCOUNTER — Ambulatory Visit: Payer: No Typology Code available for payment source | Admitting: Family

## 2022-04-09 ENCOUNTER — Institutional Professional Consult (permissible substitution): Payer: No Typology Code available for payment source | Admitting: Neurology

## 2022-04-10 ENCOUNTER — Other Ambulatory Visit (HOSPITAL_BASED_OUTPATIENT_CLINIC_OR_DEPARTMENT_OTHER): Payer: Self-pay

## 2022-04-15 ENCOUNTER — Ambulatory Visit: Payer: No Typology Code available for payment source | Admitting: Dietician

## 2022-04-15 ENCOUNTER — Other Ambulatory Visit (HOSPITAL_BASED_OUTPATIENT_CLINIC_OR_DEPARTMENT_OTHER): Payer: Self-pay

## 2022-04-15 ENCOUNTER — Encounter (INDEPENDENT_AMBULATORY_CARE_PROVIDER_SITE_OTHER): Payer: No Typology Code available for payment source | Admitting: Family

## 2022-04-15 DIAGNOSIS — F32A Depression, unspecified: Secondary | ICD-10-CM | POA: Diagnosis not present

## 2022-04-15 MED ORDER — ESCITALOPRAM OXALATE 20 MG PO TABS
20.0000 mg | ORAL_TABLET | Freq: Every day | ORAL | 0 refills | Status: DC
  Filled 2022-04-15 – 2022-05-31 (×2): qty 30, 30d supply, fill #0

## 2022-04-19 ENCOUNTER — Other Ambulatory Visit (HOSPITAL_COMMUNITY): Payer: Self-pay

## 2022-04-26 ENCOUNTER — Other Ambulatory Visit (HOSPITAL_BASED_OUTPATIENT_CLINIC_OR_DEPARTMENT_OTHER): Payer: Self-pay

## 2022-04-29 ENCOUNTER — Encounter: Payer: Self-pay | Admitting: Dietician

## 2022-04-29 ENCOUNTER — Encounter: Payer: No Typology Code available for payment source | Attending: Family | Admitting: Dietician

## 2022-04-29 DIAGNOSIS — E1165 Type 2 diabetes mellitus with hyperglycemia: Secondary | ICD-10-CM | POA: Insufficient documentation

## 2022-04-29 DIAGNOSIS — Z713 Dietary counseling and surveillance: Secondary | ICD-10-CM | POA: Insufficient documentation

## 2022-04-29 DIAGNOSIS — Z6841 Body Mass Index (BMI) 40.0 and over, adult: Secondary | ICD-10-CM | POA: Insufficient documentation

## 2022-04-29 NOTE — Progress Notes (Signed)
Nutrition Assessment for Bariatric Surgery Medical Nutrition Therapy Appt Start Time: 10:53   End Time: 11:52  Patient was seen on 04/29/2022 for Pre-Operative Nutrition Assessment. Letter of approval faxed to [facility] Surgery bariatric surgery program coordinator on 04/29/2022.   Referral stated Supervised Weight Loss (SWL) visits needed: 0  Pt completed visits.   Pt has cleared nutrition requirements.   Planned surgery: RYGB Pt expectation of surgery: To be healthier and live longer Pt expectation of dietitian: help guide in the direction of better eating.    NUTRITION ASSESSMENT   Anthropometrics  Start weight at [facility]: 308.0 lbs (date: 04/29/2022)  Height: 62 in BMI: 56.33 kg/m2     Clinical  Medical hx: Obesity, hyperlipidemia, HTN, hiatal hernia, T2DM, depression, arthritis, GERD  Medications: Xigduo XR, lisinopril, amlodipine, Trulicity, escitalopram, ondansetron, hydroxyzine, lovastatin, famotidine  Labs: A1C 9.4; HDL 56;  Notable signs/symptoms: none noted Any previous deficiencies? No  Micronutrient Nutrition Focused Physical Exam: Hair: No issues observed Eyes: No issues observed Mouth: No issues observed Neck: No issues observed Nails: No issues observed Skin: No issues observed  Lifestyle & Dietary Hx  Pt states she husband died in 01/08/19. Patient states she lives with mother and 2 sons. The pt performs the food shopping and the pt prepares the meals. She reports that she typically skips or misses 4-5 out of 21 possible meals per week. She may have 4-5 meals per week that are take-out or at a restaurant. At home she may have frozen entrees. She snacks between meals chips or ice cream. Patient works as Psychologist, sport and exercise. She denies binge eating though has felt shame and/or guilt after eating too much food.  She denies having used laxatives or vomiting to facilitate weight loss. She denies emotional eating during times of stress. She states that she knows  the difference between hunger and thirst and can tell when she is full vs satisfied. Pt states she loves her zero sodas (orange). Pt states she is kind of a "see it eater", stating if she sees it, in her mind she says, okay, let's taste this. Pt states she does not check her blood sugars, stating she used, but her glucose monitor gave out on her.  Pt states she is going to check with her diabetes nurse about getting another monitor.  Dietitian agreed and encouraged pt to check blood sugars. Pt states she has a gym down the hall from her office. Pt states she loves nuts, and is willing to use those as snacks in place of the Little Debbie cakes. Pt states she is allergic to tomatoes, stating it gives her hives.  24-Hr Dietary Recall First Meal: skip or protein shake or eggs Snack: Second Meal: sandwich and chips and little Debbie cake Snack: chips or cheese its Third Meal: take out might be [place] or subway, or frozen tyson meal or corn dogs Snack: ice cream or snack cake Beverages: zero orange sodas, water, zero sugar green tea, coffee, Gatorade   Estimated Energy Needs Calories: 1500   NUTRITION DIAGNOSIS  Overweight/obesity ([place]-3.3) related to past poor dietary habits and physical inactivity as evidenced by patient w/ planned RYGB surgery following dietary guidelines for continued weight loss.    NUTRITION INTERVENTION  Nutrition counseling (C-1) and education (E-2) to facilitate bariatric surgery goals.  Educated pt on micronutrient deficiencies post surgery and strategies to mitigate that risk   Pre-Op Goals Reviewed with the Patient Track food and beverage intake (pen and paper, MyFitness Pal, Baritastic app, etc.)  Make healthy food choices while monitoring portion sizes Consume 3 meals per day or try to eat every 3-5 hours Avoid concentrated sugars and fried foods Keep sugar & fat in the single digits per serving on food labels Practice CHEWING your food (aim for applesauce  consistency) Practice not drinking 15 minutes before, during, and 30 minutes after each meal and snack Avoid all carbonated beverages (ex: soda, sparkling beverages)  Limit caffeinated beverages (ex: coffee, tea, energy drinks) Avoid all sugar-sweetened beverages (ex: regular soda, sports drinks)  Avoid alcohol  Aim for 64-100 ounces of FLUID daily (with at least half of fluid intake being plain water)  Aim for at least 60-80 grams of PROTEIN daily Look for a liquid protein source that contains ?15 g protein and ?5 g carbohydrate (ex: shakes, drinks, shots) Make a list of non-food related activities Physical activity is an important part of a healthy lifestyle so keep it moving! The goal is to reach 150 minutes of exercise per week, including cardiovascular and weight baring activity. Get a new glucose monitor and check Blood sugars  *Goals that are bolded indicate the pt would like to start working towards these  Handouts Provided Include  Bariatric Surgery handouts (Nutrition Visits, Pre-Op Goals, Protein Shakes, Vitamins & Minerals)  Learning Style & Readiness for Change Teaching method utilized: Visual & Auditory  Demonstrated degree of understanding via: Teach Back  Readiness Level: Preparation Barriers to learning/adherence to lifestyle change: previous habits  RD's Notes for Next Visit     MONITORING & EVALUATION Dietary intake, weekly physical activity, body weight, and pre-op goals reached at next nutrition visit.    Next Steps  Pt has completed visits. No further supervised visits required/recomended  Patient is to follow up at [facility] for Pre-Op Class >2 weeks before surgery for further nutrition education.

## 2022-05-02 ENCOUNTER — Encounter: Payer: Self-pay | Admitting: *Deleted

## 2022-05-03 ENCOUNTER — Ambulatory Visit: Payer: No Typology Code available for payment source | Admitting: Family

## 2022-05-06 ENCOUNTER — Other Ambulatory Visit (HOSPITAL_COMMUNITY): Payer: Self-pay

## 2022-05-06 ENCOUNTER — Other Ambulatory Visit: Payer: Self-pay | Admitting: Gastroenterology

## 2022-05-06 ENCOUNTER — Ambulatory Visit: Payer: No Typology Code available for payment source | Admitting: Neurology

## 2022-05-06 ENCOUNTER — Encounter: Payer: Self-pay | Admitting: Neurology

## 2022-05-06 VITALS — BP 128/85 | Ht 62.0 in | Wt 306.4 lb

## 2022-05-06 DIAGNOSIS — Z9189 Other specified personal risk factors, not elsewhere classified: Secondary | ICD-10-CM

## 2022-05-06 DIAGNOSIS — R0683 Snoring: Secondary | ICD-10-CM

## 2022-05-06 DIAGNOSIS — G4763 Sleep related bruxism: Secondary | ICD-10-CM

## 2022-05-06 DIAGNOSIS — Z820 Family history of epilepsy and other diseases of the nervous system: Secondary | ICD-10-CM | POA: Diagnosis not present

## 2022-05-06 DIAGNOSIS — Z6841 Body Mass Index (BMI) 40.0 and over, adult: Secondary | ICD-10-CM

## 2022-05-06 DIAGNOSIS — R351 Nocturia: Secondary | ICD-10-CM

## 2022-05-06 MED ORDER — PANTOPRAZOLE SODIUM 40 MG PO TBEC
40.0000 mg | DELAYED_RELEASE_TABLET | Freq: Two times a day (BID) | ORAL | 0 refills | Status: DC
  Filled 2022-05-06 – 2022-07-08 (×3): qty 180, 90d supply, fill #0

## 2022-05-06 NOTE — Progress Notes (Signed)
Subjective:    Patient ID: Elizabeth [name] is a 46 y.o. female.  HPI    Star Age, MD, PhD [facility] [address] [place], [place] [address]  Dear Dr. Thermon Leyland,   I saw your patient, Elizabeth [name], upon your kind request in my sleep clinic today for initial consultation of her sleep disorder, in particular concern for underlying obstructive sleep apnea.  The patient is unaccompanied today.  As you know, Elizabeth [name] is a 46 year old right-handed woman with an underlying medical history of hypertension, reflux disease, diabetes, hyperlipidemia, hiatal hernia, anxiety, vitamin D deficiency, and morbid obesity with a BMI of over 50, who reports snoring and excessive daytime somnolence.  I reviewed your office note from 02/06/2022.  She is being evaluated for bariatric surgery.  Her Epworth sleepiness score is 3 out of 24, fatigue severity score is 60 3 out of 24.  She lives with her mom and her 2 sons, ages 59 and 75.  She lost her husband in 2020.  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  She keeps a fairly set bedtime and rise time.  She has nocturia about once per average night, she denies recurrent morning headaches.  She drinks caffeine in the form of coffee, generally 1 cup in the morning, she takes hydroxyzine 2 pills at night, no longer takes trazodone.  She drinks alcohol rarely, maybe once or twice a year, she quit smoking in November 2022.  Her brother has sleep apnea.  She has a first cousin also with sleep apnea.  She endorses tooth grinding at night, she wakes up often with a soreness in her TMJ.  Her Past Medical History Is Significant For: Past Medical History:  Diagnosis Date   Anemia    Anxiety    Arthritis of left knee    Astigmatism    Depression    Diabetes mellitus without complication (HCC)    Dry mouth    Fatigue    GERD  (gastroesophageal reflux disease)    Heartburn    High cholesterol    Hypertension    Keratoconjunctivitis    Liver disease    Muscle pain    Vitamin D deficiency    Weakness     Her Past Surgical History Is Significant For: Past Surgical History:  Procedure Laterality Date   BIOPSY  01/18/2021   Procedure: BIOPSY;  Surgeon: Lavena Bullion, DO;  Location: [facility];  Service: Gastroenterology;;   CHOLECYSTECTOMY N/A 05/11/2021   Procedure: LAPAROSCOPIC CHOLECYSTECTOMY WITH INTRAOPERATIVE CHOLANGIOGRAM;  Surgeon: Felicie Morn, MD;  Location: [facility];  Service: General;  Laterality: N/A;   DILATION AND CURETTAGE OF UTERUS     ENDOMETRIAL BIOPSY  12/19/2017   normal per pt   ESOPHAGOGASTRODUODENOSCOPY (EGD) WITH PROPOFOL N/A 01/18/2021   Procedure: ESOPHAGOGASTRODUODENOSCOPY (EGD) WITH PROPOFOL;  Surgeon: Lavena Bullion, DO;  Location: [facility];  Service: Gastroenterology;  Laterality: N/A;   TONSILLECTOMY     TUBAL LIGATION      Her Family History Is Significant For: Family History  Problem Relation Age of Onset   Diabetes Mother    Hypertension Mother    Hyperlipidemia Mother    Obesity Mother    Peripheral Artery Disease Mother    HIV Father        died from complications (IVDU)   Alcohol abuse Father    Drug abuse Father  Ovarian cancer Maternal Grandmother    Colon cancer Neg Hx    Esophageal cancer Neg Hx    Liver disease Neg Hx    Pancreatic cancer Neg Hx    Stomach cancer Neg Hx    Sleep apnea Neg Hx     Her Social History Is Significant For: Social History   Socioeconomic History   Marital status: Married    Spouse name: Elizabeth [name]   Number of children: 2   Years of education: Not on file   Highest education level: Not on file  Occupational History   Occupation: CMA - Economist: [employer]  Tobacco Use   Smoking status: Former    Packs/day: 0.50    Years: 3.00    Total pack years: 1.50    Types: Cigarettes     Quit date: 11/14/2021    Years since quitting: 0.4   Smokeless tobacco: Never   Tobacco comments:    4-5/day  Vaping Use   Vaping Use: Never used  Substance and Sexual Activity   Alcohol use: No   Drug use: No   Sexual activity: Yes    Partners: Male    Birth control/protection: Surgical, Implant  Other Topics Concern   Not on file  Social History Narrative    2 children   1997- son Elizabeth [name]   2000- son Insurance claims handler in [place] in [place]   Enjoys reading   Widowed 08/10/23, husband died from COVID-15.   Social Determinants of Health   Financial Resource Strain: Not on file  Food Insecurity: Not on file  Transportation Needs: Not on file  Physical Activity: Not on file  Stress: Not on file  Social Connections: Not on file    Her Allergies Are:  No Known Allergies:   Her Current Medications Are:  Outpatient Encounter Medications as of 05/06/2022  Medication Sig   amLODipine (NORVASC) 10 MG tablet TAKE 1 TABLET BY MOUTH ONCE DAILY   escitalopram (LEXAPRO) 20 MG tablet Take 1 tablet (20 mg total) by mouth daily.   etonogestrel (NEXPLANON) 68 MG IMPL implant 1 each by Subdermal route once.   famotidine (PEPCID) 20 MG tablet Take 1 tablet (20 mg total) by mouth daily.   hydrOXYzine (ATARAX) 25 MG tablet Take 2 tablets by mouth at bedtime as needed for insomnia.   lisinopril (ZESTRIL) 40 MG tablet TAKE 1 TABLET BY MOUTH ONCE DAILY   lovastatin (MEVACOR) 40 MG tablet TAKE 1 TABLET BY MOUTH AT BEDTIME.   meloxicam (MOBIC) 7.5 MG tablet Take 1 tablet (7.5 mg total) by mouth daily as needed for pain.   ondansetron (ZOFRAN) 4 MG tablet Take 1 tablet (4 mg total) by mouth every 8 (eight) hours as needed for nausea or vomiting.   pantoprazole (PROTONIX) 40 MG tablet Take 1 tablet (40 mg total) by mouth 2 (two) times daily.   TRULICITY 1.5 GD/9.2EQ SOPN Inject 1.5 mg under the skin once a week - STOP the 3.0 mg pens   Varenicline Tartrate, Starter, (CHANTIX  STARTING MONTH PAK) 0.5 MG X 11 & 1 MG X 42 TBPK Take according to package directions   XIGDUO XR 02-999 MG TB24 Take 2 tablets by mouth daily before breakfast.   COVID-19 At Home Antigen Test (CARESTART COVID-19 HOME TEST) KIT Use as directed.   [DISCONTINUED] sucralfate (CARAFATE) 1 g tablet Take 1 tablet (1 g total) by mouth 4 (four) times daily -  with meals and at bedtime.   [  DISCONTINUED] traZODone (DESYREL) 50 MG tablet 1/2 to 1 tab by mouth at bedtime as needed for sleep   No facility-administered encounter medications on file as of 05/06/2022.  :   Review of Systems:  Out of a complete 14 point review of systems, all are reviewed and negative with the exception of these symptoms as listed below:   Review of Systems  Neurological:        Pt here for sleep consult  Pt states snores,some fatigue,hypertension. Pt denies sleep study and CPAP machine.   ESS:3 FSS:16    Objective:  Neurological Exam  Physical Exam Physical Examination:   Vitals:   05/06/22 0913  BP: 128/85    General Examination: The patient is a very pleasant 46 y.o. female in no acute distress. She appears well-developed and well-nourished and well groomed.   HEENT: Normocephalic, atraumatic, pupils are equal, round and reactive to light, extraocular tracking is good without limitation to gaze excursion or nystagmus noted. Hearing is grossly intact. Face is symmetric with normal facial animation. Speech is clear with no dysarthria noted. There is no hypophonia. There is no lip, neck/head, jaw or voice tremor. Neck is supple with full range of passive and active motion. There are no carotid bruits on auscultation. Oropharynx exam reveals: No significant mouth dryness, good dental hygiene, mild airway crowding secondary to small airway opening, Mallampati class II, tonsils absent.  Neck circumference of 16-7/8 inches.  She has a minimal overbite.  Tongue protrudes centrally and palate elevates symmetrically.    Chest: Clear to auscultation without wheezing, rhonchi or crackles noted.  Heart: S1+S2+0, regular and normal without murmurs, rubs or gallops noted.   Abdomen: Soft, non-tender and non-distended.  Extremities: There is no pitting edema in the distal lower extremities bilaterally.   Skin: Warm and dry without trophic changes noted.   Musculoskeletal: exam reveals no obvious joint deformities, tenderness or joint swelling or erythema.   Neurologically:  Mental status: The patient is awake, alert and oriented in all 4 spheres. Her immediate and remote memory, attention, language skills and fund of knowledge are appropriate. There is no evidence of aphasia, agnosia, apraxia or anomia. Speech is clear with normal prosody and enunciation. Thought process is linear. Mood is normal and affect is normal.  Cranial nerves II - XII are as described above under HEENT exam.  Motor exam: Normal bulk, strength and tone is noted. There is no tremor, Romberg is negative. Reflexes are 2+ throughout. Fine motor skills and coordination: grossly intact.  Cerebellar testing: No dysmetria or intention tremor. There is no truncal or gait ataxia.  Sensory exam: intact to light touch in the upper and lower extremities.  Gait, station and balance: She stands easily. No veering to one side is noted. No leaning to one side is noted. Posture is age-appropriate and stance is narrow based. Gait shows normal stride length and normal pace. No problems turning are noted.   Assessment and plan:  In summary, Macall Brannick is a very pleasant 45 y.o.-year old female with an underlying medical history of hypertension, reflux disease, diabetes, hyperlipidemia, hiatal hernia, anxiety, vitamin D deficiency, and morbid obesity with a BMI of over 69, whose history and physical exam concerning for sleep disordered breathing, supporting a current working diagnosis of unspecified sleep apnea, with the main differential diagnoses of  obstructive sleep apnea (OSA) versus upper airway resistance syndrome (UARS) versus central sleep apnea (CSA), or mixed sleep apnea. A laboratory attended sleep study is considered gold standard  for evaluation of sleep disordered breathing and is recommended at this time and clinically justified.   I had a long chat with the patient about my findings and the diagnosis of sleep apnea, particularly OSA, its prognosis and treatment options. We talked about medical/conservative treatments, surgical interventions and non-pharmacological approaches for symptom control. I explained, in particular, the risks and ramifications of untreated moderate to severe OSA, especially with respect to developing cardiovascular disease down the road, including congestive heart failure (CHF), difficult to treat hypertension, cardiac arrhythmias (particularly A-fib), neurovascular complications including TIA, stroke and dementia. Even type 2 diabetes has, in part, been linked to untreated OSA. Symptoms of untreated OSA may include (but may not be limited to) daytime sleepiness, nocturia (i.e. frequent nighttime urination), memory problems, mood irritability and suboptimally controlled or worsening mood disorder such as depression and/or anxiety, lack of energy, lack of motivation, physical discomfort, as well as recurrent headaches, especially morning or nocturnal headaches. We talked about the importance of maintaining a healthy lifestyle and striving for healthy weight.  The importance of smoking cessation was also addressed.  In addition, we talked about the importance of striving for and maintaining good sleep hygiene. I recommended the following at this time: sleep study.  I outlined the differences between a laboratory attended sleep study which is considered more comprehensive and accurate over the option of a home sleep test (HST); the latter may lead to underestimation of sleep disordered breathing in some instances and does not  help with diagnosing upper airway resistance syndrome and is not accurate enough to diagnose primary central sleep apnea typically. I explained the different sleep test procedures to the patient in detail and also outlined possible surgical and non-surgical treatment options of OSA, including the use of a pressure airway pressure (PAP) device (ie CPAP, AutoPAP/APAP or BiPAP in certain circumstances), a custom-made dental device (aka oral appliance, which would require a referral to a specialist dentist or orthodontist typically, and is generally speaking not considered a good choice for patients with full dentures or edentulous state), upper airway surgical options, such as traditional UPPP (which is not considered a first-line treatment) or the Inspire device (hypoglossal nerve stimulator, which would involve a referral for consultation with an ENT surgeon, after careful selection, following inclusion criteria). I explained the PAP treatment option to the patient in detail, as this is generally considered first-line treatment.  The patient indicated that she would be willing to try PAP therapy, if the need arises. I explained the importance of being compliant with PAP treatment, not only for insurance purposes but primarily to improve patient's symptoms symptoms, and for the patient's long term health benefit, including to reduce Her cardiovascular risks longer-term.    We will pick up our discussion about the next steps and treatment options after testing.  We will keep her posted as to the test results by phone call and/or MyChart messaging where possible.  We will plan to follow-up in sleep clinic accordingly as well.  I answered all her questions today and the patient was in agreement.   I encouraged her to call with any interim questions, concerns, problems or updates or email [name] through [place].  Generally speaking, sleep test authorizations may take up to 2 weeks, sometimes less, sometimes longer, the  patient is encouraged to get in touch with [name] if they do not hear back from the sleep lab staff directly within the next 2 weeks.  Thank you very much for allowing me to participate in  the care of this nice patient. If I can be of any further assistance to you please do not hesitate to call me at [phone].  Sincerely,   Star Age, MD, PhD

## 2022-05-06 NOTE — Patient Instructions (Signed)

## 2022-05-08 ENCOUNTER — Other Ambulatory Visit (HOSPITAL_BASED_OUTPATIENT_CLINIC_OR_DEPARTMENT_OTHER): Payer: Self-pay

## 2022-05-08 ENCOUNTER — Other Ambulatory Visit (HOSPITAL_COMMUNITY): Payer: Self-pay

## 2022-05-09 ENCOUNTER — Other Ambulatory Visit: Payer: Self-pay | Admitting: Family

## 2022-05-09 DIAGNOSIS — Z1231 Encounter for screening mammogram for malignant neoplasm of breast: Secondary | ICD-10-CM

## 2022-05-10 ENCOUNTER — Ambulatory Visit (INDEPENDENT_AMBULATORY_CARE_PROVIDER_SITE_OTHER): Payer: No Typology Code available for payment source | Admitting: Family

## 2022-05-10 ENCOUNTER — Telehealth: Payer: Self-pay | Admitting: Family

## 2022-05-10 ENCOUNTER — Other Ambulatory Visit (HOSPITAL_BASED_OUTPATIENT_CLINIC_OR_DEPARTMENT_OTHER): Payer: Self-pay

## 2022-05-10 VITALS — BP 129/83 | HR 78 | Temp 97.8°F | Resp 16 | Ht 62.0 in | Wt 310.0 lb

## 2022-05-10 DIAGNOSIS — I10 Essential (primary) hypertension: Secondary | ICD-10-CM

## 2022-05-10 DIAGNOSIS — M179 Osteoarthritis of knee, unspecified: Secondary | ICD-10-CM

## 2022-05-10 DIAGNOSIS — E559 Vitamin D deficiency, unspecified: Secondary | ICD-10-CM

## 2022-05-10 DIAGNOSIS — E1165 Type 2 diabetes mellitus with hyperglycemia: Secondary | ICD-10-CM | POA: Diagnosis not present

## 2022-05-10 DIAGNOSIS — F32A Depression, unspecified: Secondary | ICD-10-CM | POA: Diagnosis not present

## 2022-05-10 LAB — BASIC METABOLIC PANEL
BUN: 9 mg/dL (ref 6–23)
CO2: 24 mEq/L (ref 19–32)
Calcium: 9.4 mg/dL (ref 8.4–10.5)
Chloride: 100 mEq/L (ref 96–112)
Creatinine, Ser: 0.67 mg/dL (ref 0.40–1.20)
GFR: 106.68 mL/min (ref >60.00)
Glucose, Bld: 245 mg/dL — ABNORMAL HIGH (ref 70–99)
Potassium: 4.7 mEq/L (ref 3.5–5.1)
Sodium: 136 mEq/L (ref 135–145)

## 2022-05-10 LAB — VITAMIN D 25 HYDROXY (VIT D DEFICIENCY, FRACTURES): VITD: 23.19 ng/mL — ABNORMAL LOW (ref 30.00–100.00)

## 2022-05-10 MED ORDER — FREESTYLE LITE W/DEVICE KIT
PACK | 0 refills | Status: DC
Start: 2022-05-10 — End: 2023-08-29
  Filled 2022-05-10: qty 1, 30d supply, fill #0

## 2022-05-10 MED ORDER — FREESTYLE LITE TEST VI STRP
ORAL_STRIP | 0 refills | Status: DC
  Filled 2022-05-10: qty 100, 25d supply, fill #0

## 2022-05-10 MED ORDER — XIGDUO XR 5-1000 MG PO TB24
ORAL_TABLET | ORAL | 0 refills | Status: DC
  Filled 2022-05-10: qty 180, 90d supply, fill #0

## 2022-05-10 MED ORDER — FREESTYLE LANCETS MISC
0 refills | Status: DC
  Filled 2022-05-10: qty 100, 25d supply, fill #0

## 2022-05-10 NOTE — Telephone Encounter (Signed)
See mychart.  

## 2022-05-10 NOTE — Assessment & Plan Note (Signed)
Did not tolerate lexapro '20mg'$  due to GI side effects.  She is taking '10mg'$  1.5 tabs once daily.  Feeling well on this dose.

## 2022-05-10 NOTE — Assessment & Plan Note (Addendum)
Lab Results  Component Value Date   HGBA1C 9.5 02/27/2022   HGBA1C 7.8 (H) 09/05/2021   HGBA1C 7.6 (H) 04/06/2021   Lab Results  Component Value Date   LDLCALC 117 02/27/2022   CREATININE 0.8 02/27/2022   Last A1C was elevated. She is requesting a refill on her Zigduo while she waits to get re-established with her endocrinologist Dr. Meredith Pel next month. Refill sent.

## 2022-05-10 NOTE — Addendum Note (Signed)
Addended by: [name] on: 05/10/2022 02:12 PM   Modules accepted: Orders

## 2022-05-10 NOTE — Assessment & Plan Note (Signed)
Not currently on supplement .  Will obtain follow up level.

## 2022-05-10 NOTE — Assessment & Plan Note (Signed)
BP Readings from Last 3 Encounters:  05/10/22 129/83  05/06/22 128/85  02/22/22 (!) 148/84   Stable on amlodipine '10mg'$  and lisinopril '40mg'$ . Continue same.

## 2022-05-10 NOTE — Progress Notes (Signed)
Subjective:   By signing my name below, I, Elizabeth [name], attest that this documentation has been prepared under the direction and in the presence of [place], NP 05/10/2022   Patient ID: Elizabeth [name], female    DOB: 07/22/1976, 46 y.o.   MRN: [id number]  Chief Complaint  Patient presents with   Depression    Here for follow up   Insomnia    "Sleeping well"   Diabetes    Here for follow up    HPI Patient is in today for an office visit.  Refill - She is requesting a refill of two tablets of 02-999 Mg of Xigduo XR.  Insomnia - She is currently taking two tablets of 25 Mg of Hydroxyzine.  Mood - She states that taking 20 Mg of Lexapro hurt her stomach. She then switched to 15 Mg of Lexapro and reports that she is feeling much better in terms of stomach and mood symptoms. She reports that she is not taking 1/2 to 1 tablets of the 50 Mg of Trazodone.  Blood Pressure - As of today's visit, her blood pressure is normal. She is currently taking 10 Mg of Amlodipine and 40 Mg of Lisinopril. BP Readings from Last 3 Encounters:  05/10/22 129/83  05/06/22 128/85  02/22/22 (!) 148/84   Pulse Readings from Last 3 Encounters:  05/10/22 78  02/22/22 84  12/31/21 90   A1C - Her A1C levels are elevating. As of today's visit, her endocrinologist is moving locations and therefore, the patient has not been able to get a refill of her 02-999 Mg of Ziguduo. She is also interested in restarting her Glipizide medications. She currently does not have a monitor because it broke. She states that her insurance was not covering enough to comfortably afford the continuous glucose monitor.  Lab Results  Component Value Date   HGBA1C 9.5 02/27/2022   Left Knee Pain- Since taking 7.5 Mg of Meloxicam, she reports that her symptoms are improving. She is also using Voltaren for her symptoms  Vitamin D - She states that she needs her vitamin D levels checked.   Health Maintenance Due  Topic  Date Due   COVID-19 Vaccine (4 - Pfizer series) 11/03/2020    Past Medical History:  Diagnosis Date   Anemia    Anxiety    Arthritis of left knee    Astigmatism    Depression    Diabetes mellitus without complication (HCC)    Dry mouth    Fatigue    GERD (gastroesophageal reflux disease)    Heartburn    High cholesterol    Hypertension    Keratoconjunctivitis    Liver disease    Muscle pain    Vitamin D deficiency    Weakness     Past Surgical History:  Procedure Laterality Date   BIOPSY  01/18/2021   Procedure: BIOPSY;  Surgeon: Lavena Bullion, DO;  Location: [facility];  Service: Gastroenterology;;   CHOLECYSTECTOMY N/A 05/11/2021   Procedure: LAPAROSCOPIC CHOLECYSTECTOMY WITH INTRAOPERATIVE CHOLANGIOGRAM;  Surgeon: Felicie Morn, MD;  Location: [facility];  Service: General;  Laterality: N/A;   DILATION AND CURETTAGE OF UTERUS     ENDOMETRIAL BIOPSY  12/19/2017   normal per pt   ESOPHAGOGASTRODUODENOSCOPY (EGD) WITH PROPOFOL N/A 01/18/2021   Procedure: ESOPHAGOGASTRODUODENOSCOPY (EGD) WITH PROPOFOL;  Surgeon: Lavena Bullion, DO;  Location: [facility];  Service: Gastroenterology;  Laterality: N/A;   TONSILLECTOMY     TUBAL LIGATION  Family History  Problem Relation Age of Onset   Diabetes Mother    Hypertension Mother    Hyperlipidemia Mother    Obesity Mother    Peripheral Artery Disease Mother    HIV Father        died from complications (IVDU)   Alcohol abuse Father    Drug abuse Father    Ovarian cancer Maternal Grandmother    Colon cancer Neg Hx    Esophageal cancer Neg Hx    Liver disease Neg Hx    Pancreatic cancer Neg Hx    Stomach cancer Neg Hx    Sleep apnea Neg Hx     Social History   Socioeconomic History   Marital status: Married    Spouse name: Elizabeth [name]   Number of children: 2   Years of education: Not on file   Highest education level: Not on file  Occupational History   Occupation: [employer]     Employer: [employer]  Tobacco Use   Smoking status: Former    Packs/day: 0.50    Years: 3.00    Total pack years: 1.50    Types: Cigarettes    Quit date: 11/14/2021    Years since quitting: 0.4   Smokeless tobacco: Never   Tobacco comments:    4-5/day  Vaping Use   Vaping Use: Never used  Substance and Sexual Activity   Alcohol use: No   Drug use: No   Sexual activity: Yes    Partners: Male    Birth control/protection: Surgical, Implant  Other Topics Concern   Not on file  Social History Narrative    2 children   1997- son Laquan   2000- son Insurance claims handler in [place] in [place]   Enjoys reading   Widowed Jul 12, 2023, husband died from COVID-13.   Social Determinants of Health   Financial Resource Strain: Not on file  Food Insecurity: Not on file  Transportation Needs: Not on file  Physical Activity: Not on file  Stress: Not on file  Social Connections: Not on file  Intimate Partner Violence: Not on file    Outpatient Medications Prior to Visit  Medication Sig Dispense Refill   amLODipine (NORVASC) 10 MG tablet TAKE 1 TABLET BY MOUTH ONCE DAILY 90 tablet 1   escitalopram (LEXAPRO) 20 MG tablet Take 1 tablet (20 mg total) by mouth daily. 30 tablet 0   etonogestrel (NEXPLANON) 68 MG IMPL implant 1 each by Subdermal route once.     famotidine (PEPCID) 20 MG tablet Take 1 tablet (20 mg total) by mouth daily. 90 tablet 3   hydrOXYzine (ATARAX) 25 MG tablet Take 2 tablets by mouth at bedtime as needed for insomnia. 60 tablet 3   lisinopril (ZESTRIL) 40 MG tablet TAKE 1 TABLET BY MOUTH ONCE DAILY 90 tablet 1   lovastatin (MEVACOR) 40 MG tablet TAKE 1 TABLET BY MOUTH AT BEDTIME. 90 tablet 1   meloxicam (MOBIC) 7.5 MG tablet Take 1 tablet (7.5 mg total) by mouth daily as needed for pain. 30 tablet 1   ondansetron (ZOFRAN) 4 MG tablet Take 1 tablet (4 mg total) by mouth every 8 (eight) hours as needed for nausea or vomiting. 30 tablet 0   pantoprazole  (PROTONIX) 40 MG tablet Take 1 tablet (40 mg total) by mouth 2 (two) times daily. 665 tablet 0   TRULICITY 1.5 LD/3.5TS SOPN Inject 1.5 mg under the skin once a week - STOP the 3.0 mg pens  2 mL 5   Varenicline Tartrate, Starter, (CHANTIX STARTING MONTH PAK) 0.5 MG X 11 & 1 MG X 42 TBPK Take according to package directions 53 each 1   COVID-19 At Home Antigen Test (CARESTART COVID-19 HOME TEST) KIT Use as directed. 2 kit 0   XIGDUO XR 02-999 MG TB24 Take 2 tablets by mouth daily before breakfast. 180 tablet 0   No facility-administered medications prior to visit.    No Known Allergies  ROS    See HPI  Objective:    Physical Exam Constitutional:      General: She is not in acute distress.    Appearance: Normal appearance. She is not ill-appearing.  HENT:     Head: Normocephalic and atraumatic.     Right Ear: External ear normal.     Left Ear: External ear normal.  Eyes:     Extraocular Movements: Extraocular movements intact.     Pupils: Pupils are equal, round, and reactive to light.  Cardiovascular:     Rate and Rhythm: Normal rate and regular rhythm.     Heart sounds: Normal heart sounds. No murmur heard.    No gallop.  Pulmonary:     Effort: Pulmonary effort is normal. No respiratory distress.     Breath sounds: Normal breath sounds. No wheezing or rales.  Skin:    General: Skin is warm and dry.  Neurological:     Mental Status: She is alert and oriented to person, place, and time.  Psychiatric:        Mood and Affect: Mood normal.        Behavior: Behavior normal.        Judgment: Judgment normal.     BP 129/83 (BP Location: Right Arm, Patient Position: Sitting, Cuff Size: Large)   Pulse 78   Temp 97.8 F (36.6 C) (Oral)   Resp 16   Ht _0  (1.575 m)   Wt (!) 310 lb (140.6 kg)   SpO2 100%   BMI 56.70 kg/m  Wt Readings from Last 3 Encounters:  05/10/22 (!) 310 lb (140.6 kg)  05/06/22 (!) 306 lb 6.4 oz (139 kg)  04/29/22 (!) 308 lb (139.7 kg)        Assessment & Plan:   Problem List Items Addressed This Visit       Unprioritized   Vitamin D deficiency    Not currently on supplement .  Will obtain follow up level.       Relevant Orders   Vitamin D (25 hydroxy)   Uncontrolled type 2 diabetes mellitus with hyperglycemia (HCC)    Lab Results  Component Value Date   HGBA1C 9.5 02/27/2022   HGBA1C 7.8 (H) 09/05/2021   HGBA1C 7.6 (H) 04/06/2021   Lab Results  Component Value Date   LDLCALC 117 02/27/2022   CREATININE 0.8 02/27/2022  Last A1C was elevated. She is requesting a refill on her Zigduo while she waits to get re-established with her endocrinologist Dr. Meredith Pel next month. Refill sent.       Relevant Medications   XIGDUO XR 02-999 MG TB24   Osteoarthritis of knee    Improved with prn meloxicam.       Essential hypertension    BP Readings from Last 3 Encounters:  05/10/22 129/83  05/06/22 128/85  02/22/22 (!) 148/84  Stable on amlodipine 13m and lisinopril 458m Continue same.       Relevant Orders   Basic metabolic panel   Depression - Primary  Did not tolerate lexapro 32m due to GI side effects.  She is taking 124m1.5 tabs once daily.  Feeling well on this dose.         Meds ordered this encounter  Medications   XIGDUO XR 02-999 MG TB24    Sig: Take 2 tablets by mouth daily before breakfast.    Dispense:  180 tablet    Refill:  0    Order Specific Question:   Supervising Provider    Answer:   BLPenni Homans [[id number]]   Blood Glucose Monitoring Suppl (FREESTYLE LITE) w/Device KIT    Sig: Use to check blood sugar 4 times a day as directed    Dispense:  1 kit    Refill:  0    Order Specific Question:   Supervising Provider    Answer:   BLMosie [id number]]    Order Specific Question:   Number of strips    Answer:   100    Order Specific Question:   Number of lancets    Answer:   100    I, MeNance PearNP, personally preformed the services described in this documentation.  All  medical record entries made by the scribe were at my direction and in my presence.  I have reviewed the chart and discharge instructions (if applicable) and agree that the record reflects my personal performance and is accurate and complete. 05/10/2022  I,Amber Collins,acting as a scEducation administratoror MeNance PearNP.,have documented all relevant documentation on the behalf of MeNance PearNP,as directed by  MeNance PearNP while in the presence of MeNance PearNP.  MeNance PearNP

## 2022-05-10 NOTE — Assessment & Plan Note (Signed)
Improved with prn meloxicam.

## 2022-05-11 ENCOUNTER — Telehealth: Payer: Self-pay | Admitting: Family

## 2022-05-11 MED ORDER — VITAMIN D (ERGOCALCIFEROL) 1.25 MG (50000 UNIT) PO CAPS
50000.0000 [IU] | ORAL_CAPSULE | ORAL | 0 refills | Status: DC
  Filled 2022-05-11: qty 12, 84d supply, fill #0

## 2022-05-11 NOTE — Telephone Encounter (Signed)
Sugar is elevated. Please keep upcoming appointment with Dr. Meredith Pel. Vitamin D level is low.  Advise patient to begin vit D 50000 units once weekly for 12 weeks, then repeat vit D level (dx Vit D deficiency).

## 2022-05-13 ENCOUNTER — Other Ambulatory Visit (HOSPITAL_BASED_OUTPATIENT_CLINIC_OR_DEPARTMENT_OTHER): Payer: Self-pay

## 2022-05-13 NOTE — Telephone Encounter (Signed)
Patient advised of results, new rx and to follow up in 3 months

## 2022-05-14 ENCOUNTER — Encounter (INDEPENDENT_AMBULATORY_CARE_PROVIDER_SITE_OTHER): Payer: Self-pay

## 2022-05-15 ENCOUNTER — Encounter: Payer: Self-pay | Admitting: Family

## 2022-05-20 ENCOUNTER — Other Ambulatory Visit (HOSPITAL_BASED_OUTPATIENT_CLINIC_OR_DEPARTMENT_OTHER): Payer: Self-pay

## 2022-05-20 DIAGNOSIS — Z0279 Encounter for issue of other medical certificate: Secondary | ICD-10-CM

## 2022-05-20 NOTE — Telephone Encounter (Signed)
Pt called with fax info to send FMLA paperwork to:  [phone] ATTN: Samella Parr

## 2022-05-29 ENCOUNTER — Encounter (INDEPENDENT_AMBULATORY_CARE_PROVIDER_SITE_OTHER): Payer: Self-pay

## 2022-05-31 ENCOUNTER — Other Ambulatory Visit (HOSPITAL_BASED_OUTPATIENT_CLINIC_OR_DEPARTMENT_OTHER): Payer: Self-pay

## 2022-05-31 MED ORDER — GLIPIZIDE ER 5 MG PO TB24
ORAL_TABLET | ORAL | 5 refills | Status: DC
  Filled 2022-05-31: qty 30, 30d supply, fill #0
  Filled 2022-07-02: qty 30, 30d supply, fill #1
  Filled 2022-08-06: qty 30, 30d supply, fill #2
  Filled 2022-09-08: qty 30, 30d supply, fill #3

## 2022-06-03 ENCOUNTER — Other Ambulatory Visit: Payer: Self-pay

## 2022-06-03 ENCOUNTER — Other Ambulatory Visit (HOSPITAL_BASED_OUTPATIENT_CLINIC_OR_DEPARTMENT_OTHER): Payer: Self-pay

## 2022-06-03 ENCOUNTER — Other Ambulatory Visit: Payer: Self-pay | Admitting: Family

## 2022-06-03 MED ORDER — ESCITALOPRAM OXALATE 10 MG PO TABS
ORAL_TABLET | ORAL | 1 refills | Status: DC
  Filled 2022-06-03: qty 45, 30d supply, fill #0
  Filled 2022-07-02: qty 45, 30d supply, fill #1

## 2022-06-03 MED ORDER — ESCITALOPRAM OXALATE 20 MG PO TABS: 20.0000 mg | ORAL_TABLET | Freq: Every day | ORAL | 0 refills | Status: DC

## 2022-06-27 ENCOUNTER — Ambulatory Visit (INDEPENDENT_AMBULATORY_CARE_PROVIDER_SITE_OTHER): Payer: No Typology Code available for payment source

## 2022-06-27 DIAGNOSIS — Z1231 Encounter for screening mammogram for malignant neoplasm of breast: Secondary | ICD-10-CM

## 2022-07-02 ENCOUNTER — Other Ambulatory Visit (HOSPITAL_BASED_OUTPATIENT_CLINIC_OR_DEPARTMENT_OTHER): Payer: Self-pay

## 2022-07-02 ENCOUNTER — Other Ambulatory Visit: Payer: Self-pay | Admitting: Family

## 2022-07-02 MED ORDER — MELOXICAM 7.5 MG PO TABS
7.5000 mg | ORAL_TABLET | Freq: Every day | ORAL | 1 refills | Status: DC | PRN
  Filled 2022-07-02: qty 30, 30d supply, fill #0
  Filled 2022-08-06: qty 30, 30d supply, fill #1

## 2022-07-05 ENCOUNTER — Other Ambulatory Visit (HOSPITAL_BASED_OUTPATIENT_CLINIC_OR_DEPARTMENT_OTHER): Payer: Self-pay

## 2022-07-05 MED ORDER — TRULICITY 1.5 MG/0.5ML ~~LOC~~ SOAJ
1.5000 mg | SUBCUTANEOUS | 5 refills | Status: DC
  Filled 2022-07-05: qty 2, 28d supply, fill #0

## 2022-07-08 ENCOUNTER — Other Ambulatory Visit (HOSPITAL_BASED_OUTPATIENT_CLINIC_OR_DEPARTMENT_OTHER): Payer: Self-pay

## 2022-07-09 ENCOUNTER — Telehealth: Payer: Self-pay | Admitting: Neurology

## 2022-07-09 ENCOUNTER — Other Ambulatory Visit (HOSPITAL_BASED_OUTPATIENT_CLINIC_OR_DEPARTMENT_OTHER): Payer: Self-pay

## 2022-07-09 NOTE — Telephone Encounter (Signed)
LVM for patient to call back to schedule   [employer] auth: [id number] (exp. 07/28/22 to 08/27/22)

## 2022-07-15 ENCOUNTER — Other Ambulatory Visit (HOSPITAL_BASED_OUTPATIENT_CLINIC_OR_DEPARTMENT_OTHER): Payer: Self-pay

## 2022-07-19 ENCOUNTER — Other Ambulatory Visit (HOSPITAL_BASED_OUTPATIENT_CLINIC_OR_DEPARTMENT_OTHER): Payer: Self-pay

## 2022-07-21 ENCOUNTER — Encounter: Payer: Self-pay | Admitting: Family

## 2022-07-22 ENCOUNTER — Ambulatory Visit: Payer: No Typology Code available for payment source | Admitting: Obstetrics & Gynecology

## 2022-08-06 ENCOUNTER — Other Ambulatory Visit: Payer: Self-pay | Admitting: Gastroenterology

## 2022-08-06 ENCOUNTER — Other Ambulatory Visit: Payer: Self-pay | Admitting: Family

## 2022-08-06 ENCOUNTER — Other Ambulatory Visit (HOSPITAL_BASED_OUTPATIENT_CLINIC_OR_DEPARTMENT_OTHER): Payer: Self-pay

## 2022-08-06 MED ORDER — ESCITALOPRAM OXALATE 10 MG PO TABS
ORAL_TABLET | ORAL | 1 refills | Status: DC
  Filled 2022-08-06: qty 45, 30d supply, fill #0
  Filled 2022-09-08: qty 45, 30d supply, fill #1

## 2022-08-06 MED ORDER — XIGDUO XR 5-1000 MG PO TB24
ORAL_TABLET | ORAL | 0 refills | Status: DC
  Filled 2022-08-06: qty 180, 90d supply, fill #0

## 2022-08-06 MED ORDER — VITAMIN D (ERGOCALCIFEROL) 1.25 MG (50000 UNIT) PO CAPS
50000.0000 [IU] | ORAL_CAPSULE | ORAL | 0 refills | Status: DC
  Filled 2022-08-06: qty 12, 84d supply, fill #0

## 2022-08-06 MED ORDER — AMLODIPINE BESYLATE 10 MG PO TABS
ORAL_TABLET | Freq: Every day | ORAL | 1 refills | Status: DC
Start: 2022-08-06 — End: 2022-11-11
  Filled 2022-08-06: qty 90, 90d supply, fill #0
  Filled 2022-11-11: qty 90, 90d supply, fill #1

## 2022-08-06 MED ORDER — FAMOTIDINE 20 MG PO TABS
20.0000 mg | ORAL_TABLET | Freq: Every day | ORAL | 0 refills | Status: DC
  Filled 2022-08-06: qty 90, 90d supply, fill #0

## 2022-08-06 MED ORDER — LISINOPRIL 40 MG PO TABS
ORAL_TABLET | Freq: Every day | ORAL | 1 refills | Status: DC
  Filled 2022-08-06: qty 90, 90d supply, fill #0
  Filled 2022-11-11: qty 90, 90d supply, fill #1

## 2022-08-07 ENCOUNTER — Other Ambulatory Visit (HOSPITAL_BASED_OUTPATIENT_CLINIC_OR_DEPARTMENT_OTHER): Payer: Self-pay

## 2022-08-08 ENCOUNTER — Other Ambulatory Visit (HOSPITAL_BASED_OUTPATIENT_CLINIC_OR_DEPARTMENT_OTHER): Payer: Self-pay

## 2022-08-09 ENCOUNTER — Ambulatory Visit: Payer: No Typology Code available for payment source | Admitting: Family

## 2022-08-12 ENCOUNTER — Other Ambulatory Visit (HOSPITAL_BASED_OUTPATIENT_CLINIC_OR_DEPARTMENT_OTHER): Payer: Self-pay

## 2022-08-12 ENCOUNTER — Ambulatory Visit (INDEPENDENT_AMBULATORY_CARE_PROVIDER_SITE_OTHER): Payer: No Typology Code available for payment source

## 2022-08-12 ENCOUNTER — Encounter: Payer: Self-pay | Admitting: Obstetrics & Gynecology

## 2022-08-12 ENCOUNTER — Ambulatory Visit (INDEPENDENT_AMBULATORY_CARE_PROVIDER_SITE_OTHER): Payer: No Typology Code available for payment source | Admitting: Obstetrics & Gynecology

## 2022-08-12 VITALS — BP 147/96 | HR 102 | Ht 63.0 in | Wt 314.0 lb

## 2022-08-12 DIAGNOSIS — N921 Excessive and frequent menstruation with irregular cycle: Secondary | ICD-10-CM | POA: Diagnosis not present

## 2022-08-12 DIAGNOSIS — Z3046 Encounter for surveillance of implantable subdermal contraceptive: Secondary | ICD-10-CM | POA: Diagnosis not present

## 2022-08-12 DIAGNOSIS — Z975 Presence of (intrauterine) contraceptive device: Secondary | ICD-10-CM

## 2022-08-12 MED ORDER — NORETHINDRONE 0.35 MG PO TABS
1.0000 | ORAL_TABLET | Freq: Every day | ORAL | 11 refills | Status: DC
  Filled 2022-08-12: qty 84, 84d supply, fill #0
  Filled 2022-10-18 – 2022-11-04 (×3): qty 84, 84d supply, fill #1

## 2022-08-12 MED ORDER — TRULICITY 1.5 MG/0.5ML ~~LOC~~ SOAJ
1.5000 mg | SUBCUTANEOUS | 5 refills | Status: DC
  Filled 2022-08-12: qty 2, 28d supply, fill #0
  Filled 2022-09-08: qty 2, 28d supply, fill #1
  Filled 2022-10-18: qty 2, 28d supply, fill #2

## 2022-08-12 NOTE — Progress Notes (Signed)
   Subjective:    Patient ID: Elizabeth [name], female    DOB: [birth year], 46 y.o.   MRN: [id number]  HPI Is a very pleasant 46 year old female who presents for bleeding on Nexplanon.  She used to be able to feel her Nexplanon and now she cannot.  She is undergoing evaluation for bariatric surgery and is soon to meet the physician to schedule.  She is passed all the tests.  Her blood pressure is high today but she did not take her hypertensive medications.  Review of Systems  Constitutional: Negative.   Respiratory: Negative.    Cardiovascular: Negative.   Gastrointestinal: Negative.   Genitourinary:  Positive for menstrual problem and vaginal bleeding.       Objective:   Physical Exam Vitals reviewed.  Constitutional:      General: She is not in acute distress.    Appearance: She is well-developed.  HENT:     Head: Normocephalic and atraumatic.  Eyes:     Conjunctiva/sclera: Conjunctivae normal.  Cardiovascular:     Rate and Rhythm: Normal rate.  Pulmonary:     Effort: Pulmonary effort is normal.  Musculoskeletal:     Comments: Nexplanon should be in her left arm and I am unable to palpate.  Patient cannot find the Nexplanon either.  Skin:    General: Skin is warm and dry.  Neurological:     Mental Status: She is alert and oriented to person, place, and time.  Psychiatric:        Mood and Affect: Mood normal.    Vitals:   08/12/22 0824  BP: (!) 147/96  Pulse: (!) 102  Weight: (!) 314 lb (142.4 kg)  Height: '5\' 3"'$  (1.6 m)      Assessment & Plan:  46 year old female with bleeding on Nexplanon outpatient Nexplanon in left arm Ultrasound of left arm Micronor to help with bleeding But will be based on ultrasound results.  She would consider an IUD in the future.  HTN--patient encouraged to take meds daily.

## 2022-08-23 ENCOUNTER — Other Ambulatory Visit (HOSPITAL_BASED_OUTPATIENT_CLINIC_OR_DEPARTMENT_OTHER): Payer: Self-pay

## 2022-08-26 ENCOUNTER — Encounter: Payer: Self-pay | Admitting: Skilled Nursing Facility1

## 2022-08-26 ENCOUNTER — Encounter: Payer: No Typology Code available for payment source | Attending: Surgery | Admitting: Skilled Nursing Facility1

## 2022-08-26 VITALS — Wt 314.0 lb

## 2022-08-26 DIAGNOSIS — Z6841 Body Mass Index (BMI) 40.0 and over, adult: Secondary | ICD-10-CM | POA: Diagnosis present

## 2022-08-26 NOTE — Progress Notes (Signed)
Pre-Operative Nutrition Class:    Patient was seen on 08/26/2022 for Pre-Operative Bariatric Surgery Education at [facility].    Surgery date: 09/30/2022 Surgery type: RYGB Start weight at [facility]: 308 Weight today: 314 pounds  Samples given per MNT protocol. Patient educated on appropriate usage:  Purdin Lot # [id number] Exp: 9/26   Bariatric Advantage Calcium  Lot # [id number] Exp:09/10/2022   Ensure Max Protein Shake Lot # [id number] Exp: [id number]     The following the learning objectives were met by the patient during this course: Identify Pre-Op Dietary Goals and will begin 2 weeks pre-operatively Identify appropriate sources of fluids and proteins  State protein recommendations and appropriate sources pre and post-operatively Identify Post-Operative Dietary Goals and will follow for 2 weeks post-operatively Identify appropriate multivitamin and calcium sources Describe the need for physical activity post-operatively and will follow MD recommendations State when to call healthcare provider regarding medication questions or post-operative complications When having a diagnosis of diabetes understanding hypoglycemia symptoms and the inclusion of 1 complex carbohydrate per meal  Handouts given during class include: Pre-Op Bariatric Surgery Diet Handout Protein Shake Handout Post-Op Bariatric Surgery Nutrition Handout BELT Program Information Flyer Support Group Information Flyer [facility] Bariatric Supplements Price List  Follow-Up Plan: Patient will follow-up at [facility] 2 weeks post operatively for diet advancement per MD.

## 2022-09-05 ENCOUNTER — Encounter: Payer: Self-pay | Admitting: Family

## 2022-09-05 ENCOUNTER — Ambulatory Visit: Payer: Self-pay | Admitting: Surgery

## 2022-09-05 DIAGNOSIS — Z01818 Encounter for other preprocedural examination: Secondary | ICD-10-CM

## 2022-09-05 NOTE — Progress Notes (Signed)
Sent message, via epic in basket, requesting orders in epic from surgeon.  

## 2022-09-06 ENCOUNTER — Ambulatory Visit: Payer: No Typology Code available for payment source | Admitting: Family

## 2022-09-06 NOTE — Telephone Encounter (Signed)
Patient advised to come in monday

## 2022-09-06 NOTE — Telephone Encounter (Signed)
Let's bring her in at 7:40 on Monday please, advised her to go to the ER if severe/worsening pain over the weekend.

## 2022-09-09 ENCOUNTER — Ambulatory Visit: Payer: No Typology Code available for payment source | Admitting: Family

## 2022-09-09 ENCOUNTER — Other Ambulatory Visit (HOSPITAL_BASED_OUTPATIENT_CLINIC_OR_DEPARTMENT_OTHER): Payer: Self-pay

## 2022-09-10 ENCOUNTER — Other Ambulatory Visit (HOSPITAL_BASED_OUTPATIENT_CLINIC_OR_DEPARTMENT_OTHER): Payer: Self-pay

## 2022-09-13 NOTE — Progress Notes (Addendum)
COVID Vaccine received:  _0  No _1  Yes Date of any COVID positive Test in last 90 days: none  PCP - [name], NP  Cardiologist - none  Chest x-ray - 02-10-22 EKG - 07-20-21  Will repeat at PST    EKG has Nonspecific T wave abnormality Stress Test - 05-03-21  ECHO -  Cardiac Cath -   PCR screen: _2  Ordered & Completed                      _3   No Order but Needs PROFEND                      _4   N/A for this surgery  Surgery Plan:  _5  Ambulatory                            _6  Outpatient in bed                            _7  Admit  Anesthesia:    _8  General  _9  Spinal                           _10   Choice _11   MAC  Bowel Prep - _12  No  _13   Yes _2 week preop diet per nutritionist_  Pacemaker / ICD device _14  No _15  Yes        Device order form faxed _16  No    _17   Yes      Faxed to:  Spinal Cord Stimulator:_18  No _19  Yes      (Remind patient to bring remote DOS) Other Implants:   History of Sleep Apnea? _20  No _21  Yes   CPAP used?- _22  No _23  Yes    Does the patient monitor blood sugar? _24  No _25  Yes  _26  N/A Does patient have a [employer] or Dexacom? _27  No _28  Yes   Fasting Blood Sugar Ranges- 177-200 Checks Blood Sugar __1  times a day  Last dose of GLP1 agonist- Trulicity  (Sunday) GLP1 instructions: Okay to Inject on Sunday 09-22-22, Hold the week of surgery and then Resume the week after surgery.   Last dose of SGLT-2 inhibitors- Xigduo XR  SGLT-2 instructions: Do Not take on Sunday 09-29-22 or the day of surgery 09-30-2022  Also, Glipizide - you may take the morning dose on Sunday 09-29-22 but DO NOT take on Monday 09-30-2022  Blood Thinner / Instructions: none Aspirin Instructions:  ERAS Protocol Ordered: _29  No  _30  Yes PRE-SURGERY _31  ENSURE  _32  G2  _33  No Drink Ordered Patient is to be NPO after: 04:15  Comments: Patient had breakthrough bleeding on the [facility] implant so MD started her on Norethindrone BCPs (she stopped these BCPs 09-01-22).  UPT  has been ordered for DOS  Activity level:  She can climb 2 flights of stairs and ADLs with only slight SOB . Sometimes she gets winded doing house work especially with the pain.     Anesthesia review: HTN, DM2 (CBG at PST was 258, patient is to start preop protein diet today. Her last A1C was 9.5 in May 2023) , Anemia, anxiety, liver disease  Patient denies shortness of breath, fever, cough and chest pain at PAT appointment.  Patient verbalized understanding and agreement to the Pre-Surgical Instructions that were given to them at this PAT appointment. Patient  was also educated of the need to review these PAT instructions again prior to his/her surgery.I reviewed the appropriate phone numbers to call if they have any and questions or concerns.

## 2022-09-13 NOTE — Patient Instructions (Addendum)
SURGICAL WAITING ROOM VISITATION Patients having surgery or a procedure may have no more than 2 support people in the waiting area - these visitors may rotate in the visitor waiting room.   Children under the age of 58 must have an adult with them who is not the patient. If the patient needs to stay at the hospital during part of their recovery, the visitor guidelines for inpatient rooms apply.  PRE-OP VISITATION  Pre-op nurse will coordinate an appropriate time for 1 support person to accompany the patient in pre-op.  This support person may not rotate.  This visitor will be contacted when the time is appropriate for the visitor to come back in the pre-op area.  Please refer to the [facility] website for the visitor guidelines for Inpatients (after your surgery is over and you are in a regular room).  You are not required to quarantine at this time prior to your surgery. However, you must do this: Hand Hygiene often Do NOT share personal items Notify your provider if you are in close contact with someone who has COVID or you develop fever 100.4 or greater, new onset of sneezing, cough, sore throat, shortness of breath or body aches.  If you test positive for Covid or have been in contact with anyone that has tested positive in the last 10 days please notify you surgeon.    Your procedure is scheduled on:  Monday September 30, 2022  Report to [facility] Main Entrance: [place] entrance where the [employer] is available.   Report to admitting at: 05:15  AM  +++++Call this number if you have any questions or problems the morning of surgery [phone]  Do not eat food after Midnight the night prior to your surgery/procedure.  After Midnight you may have the following liquids until  04:30 AM DAY OF SURGERY  Clear Liquid Diet Water Black Coffee (sugar ok, NO MILK/CREAM OR CREAMERS)  Tea (sugar ok, NO MILK/CREAM OR CREAMERS) regular and decaf                              Plain Jell-O  with no fruit (NO RED)                                           Fruit ices (not with fruit pulp, NO RED)                                     Popsicles (NO RED)                                                                  Juice: apple, WHITE grape, WHITE cranberry Sports drinks like Gatorade or Powerade (NO RED)                     The day of surgery:  Drink ONE (1) Pre-Surgery G2 at   04:30  AM the morning of surgery. Drink in one sitting. Do not sip.  This drink was given  to you during your hospital pre-op appointment visit. Nothing else to drink after completing the Pre-Surgery r G2 : No candy, chewing gum or throat lozenges.    FOLLOW ANY ADDITIONAL PRE OP INSTRUCTIONS YOU RECEIVED FROM YOUR SURGEON'S OFFICE!!!   Oral Hygiene is also important to reduce your risk of infection.        Remember - BRUSH YOUR TEETH THE MORNING OF SURGERY WITH YOUR REGULAR TOOTHPASTE  Do NOT smoke after Midnight the night before surgery.  +++++++Diabetic Medications:  Trulicity  (Monday) GLP1 instructions: Okay to Inject the week before ,on Sunday 09-22-22, DO NOT inject the week of your surgery, and then Resume the week after surgery.    Xigduo XR  SGLT-2 instructions: Do Not take on Sunday 09-29-22 or the day of surgery 09-30-2022   Glipizide - you may take the morning dose on Sunday 09-29-22 but DO NOT take on Monday 09-30-2022   Take ONLY these medicines the morning of surgery with A SIP OF WATER: escitalopram (Lexapro), amlodipine, pantoprazole (Protonix)                   You may not have any metal on your body including hair pins, jewelry, and body piercing  Do not wear make-up, lotions, powders, perfumes or deodorant  Do not wear nail polish including gel and S&S, artificial / acrylic nails, or any other type of covering on natural nails including finger and toenails. If you have artificial nails, gel coating, etc., that needs to be removed by a nail salon, Please  have this removed prior to surgery. Not doing so may mean that your surgery could be cancelled or delayed if the Surgeon or anesthesia staff feels like they are unable to monitor you safely.   Do not shave 48 hours prior to surgery to avoid nicks in your skin which may contribute to postoperative infections.   You may bring a small overnight bag with you on the day of surgery, only pack items that are not valuable .[facility] IS NOT RESPONSIBLE   FOR VALUABLES THAT ARE LOST OR STOLEN.   Do not bring your home medications to the hospital. The Pharmacy will dispense medications listed on your medication list to you during your admission in the Hospital.  Please read over the following fact sheets you were given: IF YOU HAVE QUESTIONS ABOUT YOUR PRE-OP INSTRUCTIONS, PLEASE CALL [phone]  ([place])   [facility] - Preparing for Surgery Before surgery, you can play an important role.  Because skin is not sterile, your skin needs to be as free of germs as possible.  You can reduce the number of germs on your skin by washing with CHG (chlorahexidine gluconate) soap before surgery.  CHG is an antiseptic cleaner which kills germs and bonds with the skin to continue killing germs even after washing. Please DO NOT use if you have an allergy to CHG or antibacterial soaps.  If your skin becomes reddened/irritated stop using the CHG and inform your nurse when you arrive at Short Stay. Do not shave (including legs and underarms) for at least 48 hours prior to the first CHG shower.  You may shave your face/neck.  Please follow these instructions carefully:  1.  Shower with CHG Soap the night before surgery and the  morning of surgery.  2.  If you choose to wash your hair, wash your hair first as usual with your normal  shampoo.  3.  After you shampoo, rinse your hair and body  thoroughly to remove the shampoo.                             4.  Use CHG as you would any other liquid soap.  You can apply chg directly  to the skin and wash.  Gently with a scrungie or clean washcloth.  5.  Apply the CHG Soap to your body ONLY FROM THE NECK DOWN.   Do not use on face/ open                           Wound or open sores. Avoid contact with eyes, ears mouth and genitals (private parts).                       Wash face,  Genitals (private parts) with your normal soap.             6.  Wash thoroughly, paying special attention to the area where your  surgery  will be performed.  7.  Thoroughly rinse your body with warm water from the neck down.  8.  DO NOT shower/wash with your normal soap after using and rinsing off the CHG Soap.            9.  Pat yourself dry with a clean towel.            10.  Wear clean pajamas.            11.  Place clean sheets on your bed the night of your first shower and do not  sleep with pets.  ON THE DAY OF SURGERY : Do not apply any lotions/deodorants the morning of surgery.  Please wear clean clothes to the hospital/surgery center.    FAILURE TO FOLLOW THESE INSTRUCTIONS MAY RESULT IN THE CANCELLATION OF YOUR SURGERY  PATIENT SIGNATURE_________________________________  NURSE SIGNATURE__________________________________  ________________________________________________________________________        Elizabeth [name]    An incentive spirometer is a tool that can help keep your lungs clear and active. This tool measures how well you are filling your lungs with each breath. Taking long deep breaths may help reverse or decrease the chance of developing breathing (pulmonary) problems (especially infection) following: A long period of time when you are unable to move or be active. BEFORE THE PROCEDURE  If the spirometer includes an indicator to show your best effort, your nurse or respiratory therapist will set it to a desired goal. If possible, sit up straight or lean slightly forward. Try not to slouch. Hold the incentive spirometer in an upright position. INSTRUCTIONS  FOR USE  Sit on the edge of your bed if possible, or sit up as far as you can in bed or on a chair. Hold the incentive spirometer in an upright position. Breathe out normally. Place the mouthpiece in your mouth and seal your lips tightly around it. Breathe in slowly and as deeply as possible, raising the piston or the ball toward the top of the column. Hold your breath for 3-5 seconds or for as long as possible. Allow the piston or ball to fall to the bottom of the column. Remove the mouthpiece from your mouth and breathe out normally. Rest for a few seconds and repeat Steps 1 through 7 at least 10 times every 1-2 hours when you are awake. Take your time and take a few normal breaths between deep breaths.  The spirometer may include an indicator to show your best effort. Use the indicator as a goal to work toward during each repetition. After each set of 10 deep breaths, practice coughing to be sure your lungs are clear. If you have an incision (the cut made at the time of surgery), support your incision when coughing by placing a pillow or rolled up towels firmly against it. Once you are able to get out of bed, walk around indoors and cough well. You may stop using the incentive spirometer when instructed by your caregiver.  RISKS AND COMPLICATIONS Take your time so you do not get dizzy or light-headed. If you are in pain, you may need to take or ask for pain medication before doing incentive spirometry. It is harder to take a deep breath if you are having pain. AFTER USE Rest and breathe slowly and easily. It can be helpful to keep track of a log of your progress. Your caregiver can provide you with a simple table to help with this. If you are using the spirometer at home, follow these instructions: [place] IF:  You are having difficultly using the spirometer. You have trouble using the spirometer as often as instructed. Your pain medication is not giving enough relief while using  the spirometer. You develop fever of 100.5 F (38.1 C) or higher.                                                                                                    SEEK IMMEDIATE MEDICAL CARE IF:  You cough up bloody sputum that had not been present before. You develop fever of 102 F (38.9 C) or greater. You develop worsening pain at or near the incision site. MAKE SURE YOU:  Understand these instructions. Will watch your condition. Will get help right away if you are not doing well or get worse. Document Released: 02/17/2007 Document Revised: 12/30/2011 Document Reviewed: 04/20/2007 [facility] Patient Information 2014 [place], [place].        WHAT IS A BLOOD TRANSFUSION? Blood Transfusion Information  A transfusion is the replacement of blood or some of its parts. Blood is made up of multiple cells which provide different functions. Red blood cells carry oxygen and are used for blood loss replacement. White blood cells fight against infection. Platelets control bleeding. Plasma helps clot blood. Other blood products are available for specialized needs, such as hemophilia or other clotting disorders. BEFORE THE TRANSFUSION  Who gives blood for transfusions?  Healthy volunteers who are fully evaluated to make sure their blood is safe. This is blood bank blood. Transfusion therapy is the safest it has ever been in the practice of medicine. Before blood is taken from a donor, a complete history is taken to make sure that person has no history of diseases nor engages in risky social behavior (examples are intravenous drug use or sexual activity with multiple partners). The donor's travel history is screened to minimize risk of transmitting infections, such as malaria. The donated blood is tested for signs of infectious diseases, such as HIV and hepatitis. The blood  is then tested to be sure it is compatible with you in order to minimize the chance of a transfusion reaction. If you or a  relative donates blood, this is often done in anticipation of surgery and is not appropriate for emergency situations. It takes many days to process the donated blood. RISKS AND COMPLICATIONS Although transfusion therapy is very safe and saves many lives, the main dangers of transfusion include:  Getting an infectious disease. Developing a transfusion reaction. This is an allergic reaction to something in the blood you were given. Every precaution is taken to prevent this. The decision to have a blood transfusion has been considered carefully by your caregiver before blood is given. Blood is not given unless the benefits outweigh the risks. AFTER THE TRANSFUSION Right after receiving a blood transfusion, you will usually feel much better and more energetic. This is especially true if your red blood cells have gotten low (anemic). The transfusion raises the level of the red blood cells which carry oxygen, and this usually causes an energy increase. The nurse administering the transfusion will monitor you carefully for complications. HOME CARE INSTRUCTIONS  No special instructions are needed after a transfusion. You may find your energy is better. Speak with your caregiver about any limitations on activity for underlying diseases you may have. SEEK MEDICAL CARE IF:  Your condition is not improving after your transfusion. You develop redness or irritation at the intravenous (IV) site. SEEK IMMEDIATE MEDICAL CARE IF:  Any of the following symptoms occur over the next 12 hours: Shaking chills. You have a temperature by mouth above 102 F (38.9 C), not controlled by medicine. Chest, back, or muscle pain. People around you feel you are not acting correctly or are confused. Shortness of breath or difficulty breathing. Dizziness and fainting. You get a rash or develop hives. You have a decrease in urine output. Your urine turns a dark color or changes to pink, red, or brown. Any of the following  symptoms occur over the next 10 days: You have a temperature by mouth above 102 F (38.9 C), not controlled by medicine. Shortness of breath. Weakness after normal activity. The white part of the eye turns yellow (jaundice). You have a decrease in the amount of urine or are urinating less often. Your urine turns a dark color or changes to pink, red, or brown. Document Released: 10/04/2000 Document Revised: 12/30/2011 Document Reviewed: 05/23/2008 [facility] Patient Information 2014 [place], [place].  _______________________________________________________________________

## 2022-09-16 ENCOUNTER — Encounter (HOSPITAL_COMMUNITY)
Admission: RE | Admit: 2022-09-16 | Discharge: 2022-09-16 | Disposition: A | Discharge status: Home / Self Care | Payer: No Typology Code available for payment source | Source: Ambulatory Visit | Attending: Surgery | Admitting: Surgery

## 2022-09-16 ENCOUNTER — Other Ambulatory Visit: Payer: Self-pay

## 2022-09-16 ENCOUNTER — Encounter (HOSPITAL_COMMUNITY): Payer: Self-pay

## 2022-09-16 VITALS — BP 132/88 | HR 80 | Temp 99.1°F | Resp 22 | Ht 62.0 in | Wt 311.0 lb

## 2022-09-16 DIAGNOSIS — Z01818 Encounter for other preprocedural examination: Secondary | ICD-10-CM | POA: Diagnosis present

## 2022-09-16 DIAGNOSIS — E119 Type 2 diabetes mellitus without complications: Secondary | ICD-10-CM | POA: Insufficient documentation

## 2022-09-16 DIAGNOSIS — I10 Essential (primary) hypertension: Secondary | ICD-10-CM | POA: Diagnosis not present

## 2022-09-16 LAB — COMPREHENSIVE METABOLIC PANEL
ALT: 53 U/L — ABNORMAL HIGH (ref 0–44)
AST: 32 U/L (ref 15–41)
Albumin: 3.3 g/dL — ABNORMAL LOW (ref 3.5–5.0)
Alkaline Phosphatase: 94 U/L (ref 38–126)
Anion gap: 10 (ref 5–15)
BUN: 11 mg/dL (ref 6–20)
CO2: 23 mmol/L (ref 22–32)
Calcium: 9 mg/dL (ref 8.9–10.3)
Chloride: 103 mmol/L (ref 98–111)
Creatinine, Ser: 0.76 mg/dL (ref 0.44–1.00)
GFR, Estimated: >60 mL/min (ref >60)
Glucose, Bld: 223 mg/dL — ABNORMAL HIGH (ref 70–99)
Potassium: 4.7 mmol/L (ref 3.5–5.1)
Sodium: 136 mmol/L (ref 135–145)
Total Bilirubin: 0.2 mg/dL — ABNORMAL LOW (ref 0.3–1.2)
Total Protein: 6.7 g/dL (ref 6.5–8.1)

## 2022-09-16 LAB — CBC WITH DIFFERENTIAL/PLATELET
Abs Immature Granulocytes: 0.03 10*3/uL (ref 0.00–0.07)
Basophils Absolute: 0.1 10*3/uL (ref 0.0–0.1)
Basophils Relative: 1 %
Eosinophils Absolute: 0.1 10*3/uL (ref 0.0–0.5)
Eosinophils Relative: 1 %
HCT: 44.3 % (ref 36.0–46.0)
Hemoglobin: 13.2 g/dL (ref 12.0–15.0)
Immature Granulocytes: 0 %
Lymphocytes Relative: 35 %
Lymphs Abs: 2.6 10*3/uL (ref 0.7–4.0)
MCH: 22.4 pg — ABNORMAL LOW (ref 26.0–34.0)
MCHC: 29.8 g/dL — ABNORMAL LOW (ref 30.0–36.0)
MCV: 75.3 fL — ABNORMAL LOW (ref 80.0–100.0)
Monocytes Absolute: 0.5 10*3/uL (ref 0.1–1.0)
Monocytes Relative: 7 %
Neutro Abs: 4.1 10*3/uL (ref 1.7–7.7)
Neutrophils Relative %: 56 %
Platelets: 245 10*3/uL (ref 150–400)
RBC: 5.88 MIL/uL — ABNORMAL HIGH (ref 3.87–5.11)
RDW: 16.8 % — ABNORMAL HIGH (ref 11.5–15.5)
WBC: 7.3 10*3/uL (ref 4.0–10.5)
nRBC: 0 % (ref 0.0–0.2)

## 2022-09-16 LAB — TYPE AND SCREEN
ABO/RH(D): B POS
Antibody Screen: NEGATIVE

## 2022-09-16 LAB — GLUCOSE, CAPILLARY: Glucose-Capillary: 258 mg/dL — ABNORMAL HIGH (ref 70–99)

## 2022-09-17 ENCOUNTER — Telehealth: Payer: Self-pay | Admitting: Skilled Nursing Facility1

## 2022-09-17 LAB — HEMOGLOBIN A1C
Hgb A1c MFr Bld: 10.7 % — ABNORMAL HIGH (ref 4.8–5.6)
Mean Plasma Glucose: 260 mg/dL

## 2022-09-17 NOTE — Telephone Encounter (Signed)
It is FULL liquid for 2 weeks after surgery not just clear liquids. Does that answer your question? Do you still have the packet of information from pre-op class?  From: [name] [email or web address]>  Sent: Monday, September 16, 2022 3:14 PM To: Zyiah Withington [email or web address]> Subject: Post op question  *Caution - External email - see footer for warnings* My name is [employer]. I was in the class on the evening of Nov. 6. I have my 2 week appt with you all on 12/26. I am having the bypass done along with fixing the hiatal hernia. Will the diet be the same with the hiatal hernia after surgery(clear liquid)?   Thanks in advance [employer] DOB 01-29-1976 Phone [phone]

## 2022-09-20 ENCOUNTER — Ambulatory Visit: Payer: No Typology Code available for payment source | Admitting: Family

## 2022-09-20 ENCOUNTER — Ambulatory Visit (INDEPENDENT_AMBULATORY_CARE_PROVIDER_SITE_OTHER): Payer: No Typology Code available for payment source | Admitting: Family

## 2022-09-20 VITALS — BP 132/83 | HR 88 | Temp 99.0°F | Resp 16 | Wt 307.0 lb

## 2022-09-20 DIAGNOSIS — E559 Vitamin D deficiency, unspecified: Secondary | ICD-10-CM | POA: Diagnosis not present

## 2022-09-20 DIAGNOSIS — K5903 Drug induced constipation: Secondary | ICD-10-CM | POA: Diagnosis not present

## 2022-09-20 DIAGNOSIS — I10 Essential (primary) hypertension: Secondary | ICD-10-CM

## 2022-09-20 DIAGNOSIS — E1165 Type 2 diabetes mellitus with hyperglycemia: Secondary | ICD-10-CM

## 2022-09-20 DIAGNOSIS — Z6841 Body Mass Index (BMI) 40.0 and over, adult: Secondary | ICD-10-CM

## 2022-09-20 DIAGNOSIS — E785 Hyperlipidemia, unspecified: Secondary | ICD-10-CM

## 2022-09-20 LAB — MICROALBUMIN / CREATININE URINE RATIO
Creatinine,U: 271.2 mg/dL
Microalb Creat Ratio: 1.8 mg/g (ref 0.0–30.0)
Microalb, Ur: 4.8 mg/dL — ABNORMAL HIGH (ref 0.0–1.9)

## 2022-09-20 NOTE — Progress Notes (Signed)
Subjective:     Patient ID: Elizabeth [name], female    DOB: Feb 12, 1976, 46 y.o.   MRN: [id number]  Chief Complaint  Patient presents with   Diabetes    Here for follow up   Depression    Here for follow up    Diabetes  Depression        Patient is in today for follow up.  She is scheduled for gastric bypass surgery on 09/30/22.   DM2- Reports that her sugars fasting have been 177, 168,155, 198 this AM. Maintained on glipizide, zigduo and trulicity. She is followed by Dr. [name]. She is scheduled to follow up with her in January.   Lab Results  Component Value Date   HGBA1C 10.7 (H) 09/16/2022   HGBA1C 9.5 02/27/2022   HGBA1C 7.8 (H) 09/05/2021   Lab Results  Component Value Date   LDLCALC 117 02/27/2022   CREATININE 0.76 09/16/2022   Depression- stable improved on lexapro 83m.  She continues to work with a cSocial worker SUnice Baileywhich she is finding helpful.   Health Maintenance Due  Topic Date Due   Diabetic kidney evaluation - Urine ACR  07/19/2020   COVID-19 Vaccine (4 - 2023-24 season) 06/21/2022    Past Medical History:  Diagnosis Date   Anemia    Anxiety    Arthritis of left knee    Astigmatism    Depression    Diabetes mellitus without complication (HCC)    Dry mouth    Fatigue    GERD (gastroesophageal reflux disease)    Heartburn    High cholesterol    Hypertension    Keratoconjunctivitis    Liver disease    Muscle pain    Vitamin D deficiency    Weakness     Past Surgical History:  Procedure Laterality Date   BIOPSY  01/18/2021   Procedure: BIOPSY;  Surgeon: CLavena Bullion DO;  Location: [facility] END[facility];  Service: Gastroenterology;;   CHOLECYSTECTOMY N/A 05/11/2021   Procedure: LAPAROSCOPIC CHOLECYSTECTOMY WITH INTRAOPERATIVE CHOLANGIOGRAM;  Surgeon: SFelicie Morn MD;  Location: [facility] ORS;  Service: General;  Laterality: N/A;   DILATION AND CURETTAGE OF UTERUS     ENDOMETRIAL BIOPSY  12/19/2017   normal per pt    ESOPHAGOGASTRODUODENOSCOPY (EGD) WITH PROPOFOL N/A 01/18/2021   Procedure: ESOPHAGOGASTRODUODENOSCOPY (EGD) WITH PROPOFOL;  Surgeon: CLavena Bullion DO;  Location: [facility];  Service: Gastroenterology;  Laterality: N/A;   TONSILLECTOMY     TUBAL LIGATION      Family History  Problem Relation Age of Onset   Diabetes Mother    Hypertension Mother    Hyperlipidemia Mother    Obesity Mother    Peripheral Artery Disease Mother    HIV Father        died from complications (IVDU)   Alcohol abuse Father    Drug abuse Father    Ovarian cancer Maternal Grandmother    Colon cancer Neg Hx    Esophageal cancer Neg Hx    Liver disease Neg Hx    Pancreatic cancer Neg Hx    Stomach cancer Neg Hx    Sleep apnea Neg Hx     Social History   Socioeconomic History   Marital status: Married    Spouse name: KLanny Hurst  Number of children: 2   Years of education: Not on file   Highest education level: Not on file  Occupational History   Occupation: CMA - [employer]  Tobacco Use   Smoking status: Former    Packs/day: 0.50    Years: 3.00    Total pack years: 1.50    Types: Cigarettes    Quit date: 08/21/2021    Years since quitting: 1.0   Smokeless tobacco: Never   Tobacco comments:    4-5/day  Vaping Use   Vaping Use: Never used  Substance and Sexual Activity   Alcohol use: No   Drug use: No   Sexual activity: Yes    Partners: Male    Birth control/protection: Surgical, Implant  Other Topics Concern   Not on file  Social History Narrative    2 children   1997- son Allen Kell   2000- son Insurance claims handler in [place] in [place]   Enjoys reading   Widowed 2023/07/24, husband died from COVID-41.   Social Determinants of Health   Financial Resource Strain: Not on file  Food Insecurity: Not on file  Transportation Needs: Not on file  Physical Activity: Not on file  Stress: Not on file  Social Connections: Not on file  Intimate  Partner Violence: Not on file    Outpatient Medications Prior to Visit  Medication Sig Dispense Refill   amLODipine (NORVASC) 10 MG tablet TAKE 1 TABLET BY MOUTH ONCE DAILY 90 tablet 1   Blood Glucose Monitoring Suppl (FREESTYLE LITE) w/Device KIT Use to check blood sugar 4 times a day as directed 1 kit 0   Dulaglutide (TRULICITY) 1.5 MN/8.1RR SOPN Inject 1.5 mg into the skin once a week. 2 mL 5   escitalopram (LEXAPRO) 10 MG tablet Take 1&1/2 tablets by mouth daily (15 mg) 45 tablet 1   etonogestrel (NEXPLANON) 68 MG IMPL implant 1 each by Subdermal route once.     famotidine (PEPCID) 20 MG tablet Take 1 tablet (20 mg total) by mouth daily. Please call [phone] to schedule office visit 90 tablet 0   glipiZIDE (GLIPIZIDE XL) 5 MG 24 hr tablet Take 1 tablet by mouth with food every morning once a day 30 tablet 5   glucose blood (FREESTYLE LITE) test strip Use to check blood sugar 4 times a day as directed 100 each 0   hydrOXYzine (ATARAX) 25 MG tablet Take 2 tablets by mouth at bedtime as needed for insomnia. 60 tablet 3   Lancets (FREESTYLE) lancets Use to check blood sugar 4 times a day as directed 100 each 0   lisinopril (ZESTRIL) 40 MG tablet TAKE 1 TABLET BY MOUTH ONCE DAILY 90 tablet 1   lovastatin (MEVACOR) 40 MG tablet TAKE 1 TABLET BY MOUTH AT BEDTIME. 90 tablet 1   meloxicam (MOBIC) 7.5 MG tablet Take 1 tablet (7.5 mg total) by mouth daily as needed for pain. 30 tablet 1   norethindrone (ORTHO MICRONOR) 0.35 MG tablet Take 1 tablet (0.35 mg total) by mouth daily. 28 tablet 11   pantoprazole (PROTONIX) 40 MG tablet Take 1 tablet (40 mg total) by mouth 2 (two) times daily. 338 tablet 0   TRULICITY 1.5 VA/9.1BT SOPN Inject 1.5 mg under the skin once a week - STOP the 3.0 mg pens (Patient not taking: Reported on 09/05/2022) 2 mL 5   Vitamin D, Ergocalciferol, (DRISDOL) 1.25 MG (50000 UNIT) CAPS capsule Take 1 capsule (50,000 Units total) by mouth every 7 (seven) days. 12 capsule 0    XIGDUO XR 02-999 MG TB24 Take 2 tablets by mouth daily before breakfast. 180 tablet 0   No facility-administered medications prior to visit.  Allergies  Allergen Reactions   Tomato Hives, Itching and Rash    Review of Systems  Psychiatric/Behavioral:  Positive for depression.    See HPI    Objective:    Physical Exam Constitutional:      General: She is not in acute distress.    Appearance: Normal appearance. She is well-developed.  HENT:     Head: Normocephalic and atraumatic.     Right Ear: External ear normal.     Left Ear: External ear normal.  Eyes:     General: No scleral icterus. Neck:     Thyroid: No thyromegaly.  Cardiovascular:     Rate and Rhythm: Normal rate and regular rhythm.     Heart sounds: Normal heart sounds. No murmur heard. Pulmonary:     Effort: Pulmonary effort is normal. No respiratory distress.     Breath sounds: Normal breath sounds. No wheezing.  Musculoskeletal:     Cervical back: Neck supple.  Skin:    General: Skin is warm and dry.  Neurological:     Mental Status: She is alert and oriented to person, place, and time.  Psychiatric:        Mood and Affect: Mood normal.        Behavior: Behavior normal.        Thought Content: Thought content normal.        Judgment: Judgment normal.     BP 132/83 (BP Location: Right Arm, Patient Position: Sitting, Cuff Size: Small)   Pulse 88   Temp 99 F (37.2 C) (Oral)   Resp 16   Wt (!) 307 lb (139.3 kg)   SpO2 99%   BMI 56.15 kg/m  Wt Readings from Last 3 Encounters:  09/20/22 (!) 307 lb (139.3 kg)  09/16/22 (!) 311 lb (141.1 kg)  08/26/22 (!) 314 lb (142.4 kg)       Assessment & Plan:   Problem List Items Addressed This Visit       Unprioritized   Vitamin D deficiency    She is on vit D 50000 weekly. She will complete the 12 week course and we will plan to repeat when she returns for follow up in 2 months.       Uncontrolled type 2 diabetes mellitus with hyperglycemia ([place])     Uncontrolled- advised her to reach out to her endocrinologist to discuss her recent A1C and upcoming surgery.       Relevant Orders   Urine Microalbumin w/creat. ratio   Morbid obesity with BMI of 50.0-59.9, adult [facility])    Pursuing gastric bypass.  Wt Readings from Last 3 Encounters:  09/20/22 (!) 307 lb (139.3 kg)  09/16/22 (!) 311 lb (141.1 kg)  08/26/22 (!) 314 lb (142.4 kg)        Hyperlipidemia    Lab Results  Component Value Date   CHOL 194 02/27/2022   HDL 56 02/27/2022   LDLCALC 117 02/27/2022   TRIG 100 02/27/2022   CHOLHDL 3 12/31/2021  Maintained on mevacor.  Plan to repeat when she returns in January of LDL still above 100 following weight loss surgery, consider adjusting statin.       Essential hypertension - Primary    BP Readings from Last 3 Encounters:  09/20/22 132/83  09/16/22 132/88  08/12/22 (!) 147/96  At goal, continue lisinopril and amlodipine.        Drug-induced constipation    Improved since she has improved her diet.  I am having Kenlei Esson maintain her Nexplanon, Trulicity, lovastatin, hydrOXYzine, pantoprazole, FreeStyle Lite, FREESTYLE LITE, freestyle, glipiZIDE, meloxicam, lisinopril, amLODipine, Xigduo XR, Vitamin D (Ergocalciferol), escitalopram, famotidine, norethindrone, and Trulicity.  No orders of the defined types were placed in this encounter.

## 2022-09-20 NOTE — Assessment & Plan Note (Signed)
Improved since she has improved her diet.

## 2022-09-20 NOTE — Assessment & Plan Note (Signed)
BP Readings from Last 3 Encounters:  09/20/22 132/83  09/16/22 132/88  08/12/22 (!) 147/96   At goal, continue lisinopril and amlodipine.

## 2022-09-20 NOTE — Assessment & Plan Note (Signed)
Lab Results  Component Value Date   CHOL 194 02/27/2022   HDL 56 02/27/2022   LDLCALC 117 02/27/2022   TRIG 100 02/27/2022   CHOLHDL 3 12/31/2021   Maintained on mevacor.  Plan to repeat when she returns in January of LDL still above 100 following weight loss surgery, consider adjusting statin.

## 2022-09-20 NOTE — Assessment & Plan Note (Addendum)
She is on vit D 50000 weekly. She will complete the 12 week course and we will plan to repeat when she returns for follow up in 2 months.

## 2022-09-20 NOTE — Assessment & Plan Note (Signed)
Uncontrolled- advised her to reach out to her endocrinologist to discuss her recent A1C and upcoming surgery.

## 2022-09-20 NOTE — Assessment & Plan Note (Signed)
Pursuing gastric bypass.  Wt Readings from Last 3 Encounters:  09/20/22 (!) 307 lb (139.3 kg)  09/16/22 (!) 311 lb (141.1 kg)  08/26/22 (!) 314 lb (142.4 kg)

## 2022-09-27 ENCOUNTER — Ambulatory Visit: Payer: No Typology Code available for payment source | Admitting: Family

## 2022-09-29 NOTE — Anesthesia Preprocedure Evaluation (Addendum)
Anesthesia Evaluation  Patient identified by MRN, date of birth, ID band Patient awake    Reviewed: Allergy & Precautions, NPO status , Patient's Chart, lab work & pertinent test results  History of Anesthesia Complications Negative for: history of anesthetic complications  Airway Mallampati: II  TM Distance: >3 FB Neck ROM: Full    Dental no notable dental hx.    Pulmonary former smoker   Pulmonary exam normal        Cardiovascular hypertension, Pt. on medications Normal cardiovascular exam     Neuro/Psych   Anxiety Depression    negative neurological ROS     GI/Hepatic Neg liver ROS, hiatal hernia,GERD  Medicated,,  Endo/Other  diabetes (on Trulicity), Type 2, Oral Hypoglycemic Agents  Morbid obesity  Renal/GU negative Renal ROS  negative genitourinary   Musculoskeletal  (+) Arthritis ,    Abdominal   Peds  Hematology negative hematology ROS (+)   Anesthesia Other Findings Day of surgery medications reviewed with patient.  Reproductive/Obstetrics negative OB ROS                              Anesthesia Physical Anesthesia Plan  ASA: 3  Anesthesia Plan: General   Post-op Pain Management: Ketamine IV* and Tylenol PO (pre-op)*   Induction: Intravenous  PONV Risk Score and Plan: 3 and Treatment may vary due to age or medical condition, Midazolam, Scopolamine patch - Pre-op, Propofol infusion, TIVA, Dexamethasone, Ondansetron and Aprepitant  Airway Management Planned: Oral ETT  Additional Equipment: None  Intra-op Plan:   Post-operative Plan: Extubation in OR  Informed Consent: I have reviewed the patients History and Physical, chart, labs and discussed the procedure including the risks, benefits and alternatives for the proposed anesthesia with the patient or authorized representative who has indicated his/her understanding and acceptance.     Dental advisory given  Plan  Discussed with: CRNA  Anesthesia Plan Comments:         Anesthesia Quick Evaluation

## 2022-09-30 ENCOUNTER — Other Ambulatory Visit: Payer: Self-pay

## 2022-09-30 ENCOUNTER — Encounter (HOSPITAL_COMMUNITY): Payer: Self-pay | Admitting: Surgery

## 2022-09-30 ENCOUNTER — Inpatient Hospital Stay (HOSPITAL_COMMUNITY)
Admission: RE | Admit: 2022-09-30 | Discharge: 2022-10-01 | DRG: 621 | Disposition: A | Discharge status: Home / Self Care | Payer: No Typology Code available for payment source | Attending: Surgery | Admitting: Surgery

## 2022-09-30 ENCOUNTER — Inpatient Hospital Stay (HOSPITAL_COMMUNITY): Payer: No Typology Code available for payment source | Admitting: Physician Assistant

## 2022-09-30 ENCOUNTER — Inpatient Hospital Stay (HOSPITAL_COMMUNITY): Payer: No Typology Code available for payment source | Admitting: Anesthesiology

## 2022-09-30 ENCOUNTER — Encounter (HOSPITAL_COMMUNITY)
Admission: RE | Disposition: A | Discharge status: Home / Self Care | Payer: Self-pay | Source: Home / Self Care | Attending: Surgery

## 2022-09-30 DIAGNOSIS — Z87891 Personal history of nicotine dependence: Secondary | ICD-10-CM

## 2022-09-30 DIAGNOSIS — Z6841 Body Mass Index (BMI) 40.0 and over, adult: Secondary | ICD-10-CM

## 2022-09-30 DIAGNOSIS — M1712 Unilateral primary osteoarthritis, left knee: Secondary | ICD-10-CM | POA: Diagnosis present

## 2022-09-30 DIAGNOSIS — Z01818 Encounter for other preprocedural examination: Secondary | ICD-10-CM

## 2022-09-30 DIAGNOSIS — K219 Gastro-esophageal reflux disease without esophagitis: Secondary | ICD-10-CM | POA: Diagnosis present

## 2022-09-30 DIAGNOSIS — E559 Vitamin D deficiency, unspecified: Secondary | ICD-10-CM | POA: Diagnosis present

## 2022-09-30 DIAGNOSIS — F32A Depression, unspecified: Secondary | ICD-10-CM | POA: Diagnosis present

## 2022-09-30 DIAGNOSIS — E119 Type 2 diabetes mellitus without complications: Secondary | ICD-10-CM | POA: Diagnosis present

## 2022-09-30 DIAGNOSIS — Z8041 Family history of malignant neoplasm of ovary: Secondary | ICD-10-CM | POA: Diagnosis not present

## 2022-09-30 DIAGNOSIS — I10 Essential (primary) hypertension: Secondary | ICD-10-CM | POA: Diagnosis present

## 2022-09-30 DIAGNOSIS — Z83438 Family history of other disorder of lipoprotein metabolism and other lipidemia: Secondary | ICD-10-CM | POA: Diagnosis not present

## 2022-09-30 DIAGNOSIS — K449 Diaphragmatic hernia without obstruction or gangrene: Secondary | ICD-10-CM | POA: Diagnosis present

## 2022-09-30 DIAGNOSIS — Z8249 Family history of ischemic heart disease and other diseases of the circulatory system: Secondary | ICD-10-CM

## 2022-09-30 DIAGNOSIS — Z833 Family history of diabetes mellitus: Secondary | ICD-10-CM

## 2022-09-30 DIAGNOSIS — E7800 Pure hypercholesterolemia, unspecified: Secondary | ICD-10-CM | POA: Diagnosis present

## 2022-09-30 HISTORY — PX: UPPER GI ENDOSCOPY: SHX6162

## 2022-09-30 HISTORY — PX: HIATAL HERNIA REPAIR: SHX195

## 2022-09-30 HISTORY — PX: LAPAROSCOPIC ROUX-EN-Y GASTRIC BYPASS WITH HIATAL HERNIA REPAIR: SHX6513

## 2022-09-30 LAB — CREATININE, SERUM
Creatinine, Ser: 0.78 mg/dL (ref 0.44–1.00)
GFR, Estimated: >60 mL/min (ref >60)

## 2022-09-30 LAB — HEMOGLOBIN AND HEMATOCRIT, BLOOD
HCT: 46.1 % — ABNORMAL HIGH (ref 36.0–46.0)
Hemoglobin: 13.9 g/dL (ref 12.0–15.0)

## 2022-09-30 LAB — GLUCOSE, CAPILLARY
Glucose-Capillary: 194 mg/dL — ABNORMAL HIGH (ref 70–99)
Glucose-Capillary: 337 mg/dL — ABNORMAL HIGH (ref 70–99)
Glucose-Capillary: 337 mg/dL — ABNORMAL HIGH (ref 70–99)
Glucose-Capillary: 287 mg/dL — ABNORMAL HIGH (ref 70–99)
Glucose-Capillary: 325 mg/dL — ABNORMAL HIGH (ref 70–99)

## 2022-09-30 LAB — CBC
HCT: 43.3 % (ref 36.0–46.0)
Hemoglobin: 13.1 g/dL (ref 12.0–15.0)
MCH: 22.9 pg — ABNORMAL LOW (ref 26.0–34.0)
MCHC: 30.3 g/dL (ref 30.0–36.0)
MCV: 75.6 fL — ABNORMAL LOW (ref 80.0–100.0)
Platelets: 325 10*3/uL (ref 150–400)
RBC: 5.73 MIL/uL — ABNORMAL HIGH (ref 3.87–5.11)
RDW: 16 % — ABNORMAL HIGH (ref 11.5–15.5)
WBC: 12.7 10*3/uL — ABNORMAL HIGH (ref 4.0–10.5)
nRBC: 0 % (ref 0.0–0.2)

## 2022-09-30 LAB — ABO/RH: ABO/RH(D): B POS

## 2022-09-30 LAB — POCT PREGNANCY, URINE: Preg Test, Ur: NEGATIVE

## 2022-09-30 SURGERY — CREATION, GASTRIC BYPASS, ROUX-EN-Y, ROBOT-ASSISTED
Anesthesia: General | Site: Esophagus

## 2022-09-30 MED ORDER — FENTANYL CITRATE (PF) 100 MCG/2ML IJ SOLN
INTRAMUSCULAR | Status: DC | PRN
  Administered 2022-09-30 (×2): 50 ug via INTRAVENOUS
  Administered 2022-09-30: 100 ug via INTRAVENOUS
  Administered 2022-09-30 (×2): 50 ug via INTRAVENOUS

## 2022-09-30 MED ORDER — SODIUM CHLORIDE 0.9 % IV SOLN
2.0000 g | INTRAVENOUS | Status: DC
  Filled 2022-09-30: qty 2

## 2022-09-30 MED ORDER — BUPIVACAINE LIPOSOME 1.3 % IJ SUSP: 20.0000 mL | Freq: Once | INTRAMUSCULAR | Status: DC

## 2022-09-30 MED ORDER — APREPITANT 40 MG PO CAPS: 40.0000 mg | ORAL_CAPSULE | ORAL | Status: DC

## 2022-09-30 MED ORDER — ROCURONIUM BROMIDE 100 MG/10ML IV SOLN
INTRAVENOUS | Status: DC | PRN
  Administered 2022-09-30: 20 mg via INTRAVENOUS
  Administered 2022-09-30: 10 mg via INTRAVENOUS
  Administered 2022-09-30: 100 mg via INTRAVENOUS
  Administered 2022-09-30: 10 mg via INTRAVENOUS

## 2022-09-30 MED ORDER — SCOPOLAMINE 1 MG/3DAYS TD PT72
1.0000 | MEDICATED_PATCH | Freq: Once | TRANSDERMAL | Status: DC
  Administered 2022-09-30: 1.5 mg via TRANSDERMAL
  Filled 2022-09-30: qty 1

## 2022-09-30 MED ORDER — ONDANSETRON HCL 4 MG/2ML IJ SOLN
INTRAMUSCULAR | Status: AC
  Filled 2022-09-30: qty 2

## 2022-09-30 MED ORDER — BUPIVACAINE-EPINEPHRINE (PF) 0.5% -1:200000 IJ SOLN
INTRAMUSCULAR | Status: AC
  Filled 2022-09-30: qty 30

## 2022-09-30 MED ORDER — ENSURE MAX PROTEIN PO LIQD
2.0000 [oz_av] | ORAL | Status: DC
  Administered 2022-10-01 (×6): 2 [oz_av] via ORAL

## 2022-09-30 MED ORDER — ACETAMINOPHEN 500 MG PO TABS
1000.0000 mg | ORAL_TABLET | ORAL | Status: AC
  Administered 2022-09-30: 1000 mg via ORAL
  Filled 2022-09-30: qty 2

## 2022-09-30 MED ORDER — HYDROMORPHONE HCL 1 MG/ML IJ SOLN
0.2500 mg | INTRAMUSCULAR | Status: DC | PRN
  Administered 2022-09-30 (×3): 0.5 mg via INTRAVENOUS

## 2022-09-30 MED ORDER — 0.9 % SODIUM CHLORIDE (POUR BTL) OPTIME
TOPICAL | Status: DC | PRN
  Administered 2022-09-30: 1000 mL

## 2022-09-30 MED ORDER — PROPOFOL 1000 MG/100ML IV EMUL
INTRAVENOUS | Status: AC
  Filled 2022-09-30: qty 100

## 2022-09-30 MED ORDER — ONDANSETRON HCL 4 MG/2ML IJ SOLN
INTRAMUSCULAR | Status: DC | PRN
  Administered 2022-09-30: 4 mg via INTRAVENOUS

## 2022-09-30 MED ORDER — AMLODIPINE BESYLATE 10 MG PO TABS
10.0000 mg | ORAL_TABLET | Freq: Every day | ORAL | Status: DC
  Administered 2022-10-01: 10 mg via ORAL
  Filled 2022-09-30: qty 1

## 2022-09-30 MED ORDER — HYDROMORPHONE HCL 1 MG/ML IJ SOLN
INTRAMUSCULAR | Status: AC
  Administered 2022-09-30: 0.5 mg via INTRAVENOUS
  Filled 2022-09-30: qty 1

## 2022-09-30 MED ORDER — OXYCODONE HCL 5 MG/5ML PO SOLN
5.0000 mg | Freq: Four times a day (QID) | ORAL | Status: DC | PRN
  Administered 2022-09-30 – 2022-10-01 (×3): 5 mg via ORAL
  Filled 2022-09-30 (×4): qty 5

## 2022-09-30 MED ORDER — KETAMINE HCL 10 MG/ML IJ SOLN
INTRAMUSCULAR | Status: AC
  Filled 2022-09-30: qty 1

## 2022-09-30 MED ORDER — MIDAZOLAM HCL 5 MG/5ML IJ SOLN
INTRAMUSCULAR | Status: DC | PRN
  Administered 2022-09-30: 2 mg via INTRAVENOUS

## 2022-09-30 MED ORDER — PROPOFOL 500 MG/50ML IV EMUL
INTRAVENOUS | Status: DC | PRN
  Administered 2022-09-30: 175 ug/kg/min via INTRAVENOUS

## 2022-09-30 MED ORDER — AMISULPRIDE (ANTIEMETIC) 5 MG/2ML IV SOLN: 10.0000 mg | Freq: Once | INTRAVENOUS | Status: DC | PRN

## 2022-09-30 MED ORDER — ACETAMINOPHEN 10 MG/ML IV SOLN: 1000.0000 mg | Freq: Once | INTRAVENOUS | Status: DC | PRN

## 2022-09-30 MED ORDER — FENTANYL CITRATE (PF) 100 MCG/2ML IJ SOLN
INTRAMUSCULAR | Status: AC
  Filled 2022-09-30: qty 2

## 2022-09-30 MED ORDER — PROPOFOL 10 MG/ML IV BOLUS
INTRAVENOUS | Status: DC | PRN
  Administered 2022-09-30: 200 mg via INTRAVENOUS

## 2022-09-30 MED ORDER — MIDAZOLAM HCL 2 MG/2ML IJ SOLN
INTRAMUSCULAR | Status: AC
  Filled 2022-09-30: qty 2

## 2022-09-30 MED ORDER — DEXAMETHASONE SODIUM PHOSPHATE 10 MG/ML IJ SOLN
INTRAMUSCULAR | Status: AC
  Filled 2022-09-30: qty 1

## 2022-09-30 MED ORDER — INSULIN ASPART 100 UNIT/ML IJ SOLN
0.0000 [IU] | INTRAMUSCULAR | Status: DC
  Administered 2022-09-30: 15 [IU] via SUBCUTANEOUS
  Administered 2022-09-30: 11 [IU] via SUBCUTANEOUS
  Administered 2022-10-01: 4 [IU] via SUBCUTANEOUS
  Administered 2022-10-01: 7 [IU] via SUBCUTANEOUS
  Administered 2022-10-01 (×2): 4 [IU] via SUBCUTANEOUS
  Administered 2022-10-01: 7 [IU] via SUBCUTANEOUS

## 2022-09-30 MED ORDER — PRAVASTATIN SODIUM 20 MG PO TABS
40.0000 mg | ORAL_TABLET | Freq: Every day | ORAL | Status: DC
  Administered 2022-09-30 – 2022-10-01 (×2): 40 mg via ORAL
  Filled 2022-09-30 (×2): qty 2

## 2022-09-30 MED ORDER — LABETALOL HCL 5 MG/ML IV SOLN
INTRAVENOUS | Status: DC | PRN
  Administered 2022-09-30 (×2): 5 mg via INTRAVENOUS

## 2022-09-30 MED ORDER — ACETAMINOPHEN 500 MG PO TABS
1000.0000 mg | ORAL_TABLET | Freq: Three times a day (TID) | ORAL | Status: DC
  Administered 2022-09-30 – 2022-10-01 (×3): 1000 mg via ORAL
  Filled 2022-09-30 (×3): qty 2

## 2022-09-30 MED ORDER — KETAMINE HCL 10 MG/ML IJ SOLN
INTRAMUSCULAR | Status: DC | PRN
  Administered 2022-09-30: 15 mg via INTRAVENOUS
  Administered 2022-09-30: 10 mg via INTRAVENOUS

## 2022-09-30 MED ORDER — INSULIN ASPART 100 UNIT/ML IJ SOLN
8.0000 [IU] | Freq: Once | INTRAMUSCULAR | Status: AC
  Administered 2022-09-30: 8 [IU] via SUBCUTANEOUS

## 2022-09-30 MED ORDER — LACTATED RINGERS IV SOLN: INTRAVENOUS | Status: DC

## 2022-09-30 MED ORDER — HEPARIN SODIUM (PORCINE) 5000 UNIT/ML IJ SOLN
5000.0000 [IU] | Freq: Three times a day (TID) | INTRAMUSCULAR | Status: DC
  Administered 2022-09-30 – 2022-10-01 (×3): 5000 [IU] via SUBCUTANEOUS
  Filled 2022-09-30 (×3): qty 1

## 2022-09-30 MED ORDER — CHLORHEXIDINE GLUCONATE CLOTH 2 % EX PADS: 6.0000 | MEDICATED_PAD | Freq: Once | CUTANEOUS | Status: DC

## 2022-09-30 MED ORDER — HEPARIN SODIUM (PORCINE) 5000 UNIT/ML IJ SOLN
5000.0000 [IU] | INTRAMUSCULAR | Status: AC
  Administered 2022-09-30: 5000 [IU] via SUBCUTANEOUS
  Filled 2022-09-30: qty 1

## 2022-09-30 MED ORDER — PHENYLEPHRINE 80 MCG/ML (10ML) SYRINGE FOR IV PUSH (FOR BLOOD PRESSURE SUPPORT)
PREFILLED_SYRINGE | INTRAVENOUS | Status: DC | PRN
  Administered 2022-09-30: 80 ug via INTRAVENOUS

## 2022-09-30 MED ORDER — CHLORHEXIDINE GLUCONATE 0.12 % MT SOLN
15.0000 mL | Freq: Once | OROMUCOSAL | Status: AC
  Administered 2022-09-30: 15 mL via OROMUCOSAL

## 2022-09-30 MED ORDER — ROCURONIUM BROMIDE 10 MG/ML (PF) SYRINGE
PREFILLED_SYRINGE | INTRAVENOUS | Status: AC
  Filled 2022-09-30: qty 10

## 2022-09-30 MED ORDER — BUPIVACAINE LIPOSOME 1.3 % IJ SUSP
INTRAMUSCULAR | Status: DC | PRN
  Administered 2022-09-30: 50 mL

## 2022-09-30 MED ORDER — STERILE WATER FOR IRRIGATION IR SOLN
Status: DC | PRN
  Administered 2022-09-30: 1000 mL

## 2022-09-30 MED ORDER — HYDROMORPHONE HCL 1 MG/ML IJ SOLN
INTRAMUSCULAR | Status: AC
  Filled 2022-09-30: qty 1

## 2022-09-30 MED ORDER — LABETALOL HCL 5 MG/ML IV SOLN
INTRAVENOUS | Status: AC
  Filled 2022-09-30: qty 4

## 2022-09-30 MED ORDER — LIDOCAINE HCL (CARDIAC) PF 100 MG/5ML IV SOSY
PREFILLED_SYRINGE | INTRAVENOUS | Status: DC | PRN
  Administered 2022-09-30: 100 mg via INTRAVENOUS

## 2022-09-30 MED ORDER — ONDANSETRON HCL 4 MG/2ML IJ SOLN: 4.0000 mg | INTRAMUSCULAR | Status: DC | PRN

## 2022-09-30 MED ORDER — PANTOPRAZOLE SODIUM 40 MG IV SOLR
40.0000 mg | Freq: Every day | INTRAVENOUS | Status: DC
  Administered 2022-09-30: 40 mg via INTRAVENOUS
  Filled 2022-09-30: qty 10

## 2022-09-30 MED ORDER — LIDOCAINE HCL (PF) 2 % IJ SOLN
INTRAMUSCULAR | Status: AC
  Filled 2022-09-30: qty 5

## 2022-09-30 MED ORDER — ACETAMINOPHEN 160 MG/5ML PO SOLN
1000.0000 mg | Freq: Three times a day (TID) | ORAL | Status: DC
  Administered 2022-10-01: 1000 mg via ORAL
  Filled 2022-09-30: qty 40.6

## 2022-09-30 MED ORDER — INSULIN ASPART 100 UNIT/ML IJ SOLN
INTRAMUSCULAR | Status: AC
  Filled 2022-09-30: qty 1

## 2022-09-30 MED ORDER — APREPITANT 40 MG PO CAPS
40.0000 mg | ORAL_CAPSULE | Freq: Once | ORAL | Status: AC
  Administered 2022-09-30: 40 mg via ORAL
  Filled 2022-09-30: qty 1

## 2022-09-30 MED ORDER — PROPOFOL 10 MG/ML IV BOLUS
INTRAVENOUS | Status: AC
  Filled 2022-09-30: qty 20

## 2022-09-30 MED ORDER — SIMETHICONE 80 MG PO CHEW
80.0000 mg | CHEWABLE_TABLET | Freq: Four times a day (QID) | ORAL | Status: DC | PRN
  Administered 2022-09-30: 80 mg via ORAL
  Filled 2022-09-30: qty 1

## 2022-09-30 MED ORDER — LISINOPRIL 20 MG PO TABS
40.0000 mg | ORAL_TABLET | Freq: Every day | ORAL | Status: DC
  Administered 2022-10-01: 40 mg via ORAL
  Filled 2022-09-30: qty 2

## 2022-09-30 MED ORDER — HYDROXYZINE HCL 25 MG PO TABS: 50.0000 mg | ORAL_TABLET | Freq: Every evening | ORAL | Status: DC | PRN

## 2022-09-30 MED ORDER — MORPHINE SULFATE (PF) 2 MG/ML IV SOLN
1.0000 mg | INTRAVENOUS | Status: DC | PRN
  Administered 2022-09-30: 1 mg via INTRAVENOUS
  Filled 2022-09-30: qty 1

## 2022-09-30 MED ORDER — BUPIVACAINE LIPOSOME 1.3 % IJ SUSP
INTRAMUSCULAR | Status: AC
  Filled 2022-09-30: qty 20

## 2022-09-30 MED ORDER — SUGAMMADEX SODIUM 200 MG/2ML IV SOLN
INTRAVENOUS | Status: DC | PRN
  Administered 2022-09-30: 280 mg via INTRAVENOUS

## 2022-09-30 MED ORDER — DEXAMETHASONE SODIUM PHOSPHATE 10 MG/ML IJ SOLN
INTRAMUSCULAR | Status: DC | PRN
  Administered 2022-09-30: 4 mg via INTRAVENOUS

## 2022-09-30 MED ORDER — ORAL CARE MOUTH RINSE: 15.0000 mL | Freq: Once | OROMUCOSAL | Status: AC

## 2022-09-30 MED ORDER — ESCITALOPRAM OXALATE 10 MG PO TABS
15.0000 mg | ORAL_TABLET | Freq: Every day | ORAL | Status: DC
  Administered 2022-10-01: 15 mg via ORAL
  Filled 2022-09-30: qty 2

## 2022-09-30 MED ORDER — LACTATED RINGERS IR SOLN
Status: DC | PRN
  Administered 2022-09-30: 1000 mL

## 2022-09-30 SURGICAL SUPPLY — 77 items
ANTIFOG SOL W/FOAM PAD STRL (MISCELLANEOUS) ×2
APPLICATOR ARISTA FLEXITIP XL (MISCELLANEOUS) IMPLANT
APPLIER CLIP 5 13 M/L LIGAMAX5 (MISCELLANEOUS)
APPLIER CLIP ROT 10 11.4 M/L (STAPLE)
BLADE SURG SZ11 CARB STEEL (BLADE) ×2 IMPLANT
CANNULA REDUC XI 12-8 STAPL (CANNULA) ×2
CANNULA REDUCER 12-8 DVNC XI (CANNULA) ×2 IMPLANT
CHLORAPREP W/TINT 26 (MISCELLANEOUS) ×4 IMPLANT
CLIP APPLIE 5 13 M/L LIGAMAX5 (MISCELLANEOUS) IMPLANT
CLIP APPLIE ROT 10 11.4 M/L (STAPLE) IMPLANT
COVER SURGICAL LIGHT HANDLE (MISCELLANEOUS) ×2 IMPLANT
COVER TIP SHEARS 8 DVNC (MISCELLANEOUS) ×2 IMPLANT
COVER TIP SHEARS 8MM DA VINCI (MISCELLANEOUS) ×2
DERMABOND ADVANCED .7 DNX12 (GAUZE/BANDAGES/DRESSINGS) ×2 IMPLANT
DRAPE ARM DVNC X/XI (DISPOSABLE) ×8 IMPLANT
DRAPE COLUMN DVNC XI (DISPOSABLE) ×2 IMPLANT
DRAPE DA VINCI XI ARM (DISPOSABLE) ×8
DRAPE DA VINCI XI COLUMN (DISPOSABLE) ×2
ELECT REM PT RETURN 15FT ADLT (MISCELLANEOUS) ×2 IMPLANT
GAUZE 4X4 16PLY ~~LOC~~+RFID DBL (SPONGE) ×2 IMPLANT
GLOVE BIO SURGEON STRL SZ7.5 (GLOVE) ×4 IMPLANT
GLOVE INDICATOR 8.0 STRL GRN (GLOVE) ×4 IMPLANT
GOWN STRL REUS W/ TWL XL LVL3 (GOWN DISPOSABLE) ×6 IMPLANT
GOWN STRL REUS W/TWL XL LVL3 (GOWN DISPOSABLE) ×6
GRASPER SUT TROCAR 14GX15 (MISCELLANEOUS) ×2 IMPLANT
HEMOSTAT ARISTA ABSORB 3G PWDR (HEMOSTASIS) IMPLANT
IRRIG SUCT STRYKERFLOW 2 WTIP (MISCELLANEOUS) ×2
IRRIGATION SUCT STRKRFLW 2 WTP (MISCELLANEOUS) ×2 IMPLANT
IRRIGATOR SUCT 8 DISP DVNC XI (IRRIGATION / IRRIGATOR) IMPLANT
IRRIGATOR SUCTION 8MM XI DISP (IRRIGATION / IRRIGATOR)
KIT BASIN OR (CUSTOM PROCEDURE TRAY) ×2 IMPLANT
KIT GASTRIC LAVAGE 34FR ADT (SET/KITS/TRAYS/PACK) ×2 IMPLANT
KIT TURNOVER KIT A (KITS) IMPLANT
LUBRICANT JELLY K Y 4OZ (MISCELLANEOUS) IMPLANT
MARKER SKIN DUAL TIP RULER LAB (MISCELLANEOUS) ×2 IMPLANT
MAT PREVALON FULL STRYKER (MISCELLANEOUS) ×2 IMPLANT
NDL SPNL 18GX3.5 QUINCKE PK (NEEDLE) ×2 IMPLANT
NEEDLE SPNL 18GX3.5 QUINCKE PK (NEEDLE) ×2 IMPLANT
OBTURATOR OPTICAL STANDARD 8MM (TROCAR) ×2
OBTURATOR OPTICAL STND 8 DVNC (TROCAR) ×2
OBTURATOR OPTICALSTD 8 DVNC (TROCAR) ×2 IMPLANT
PACK CARDIOVASCULAR III (CUSTOM PROCEDURE TRAY) ×2 IMPLANT
RELOAD STAPLE 60 2.5 WHT DVNC (STAPLE) ×2 IMPLANT
RELOAD STAPLE 60 3.5 BLU DVNC (STAPLE) IMPLANT
RELOAD STAPLER 2.5X60 WHT DVNC (STAPLE) ×16 IMPLANT
RELOAD STAPLER 3.5X60 BLU DVNC (STAPLE) ×2 IMPLANT
SEAL CANN UNIV 5-8 DVNC XI (MISCELLANEOUS) ×6 IMPLANT
SEAL XI 5MM-8MM UNIVERSAL (MISCELLANEOUS) ×6
SEALER VESSEL DA VINCI XI (MISCELLANEOUS) ×2
SEALER VESSEL EXT DVNC XI (MISCELLANEOUS) ×2 IMPLANT
SET TUBE SMOKE EVAC HIGH FLOW (TUBING) ×2 IMPLANT
SOLUTION ANTFG W/FOAM PAD STRL (MISCELLANEOUS) ×2 IMPLANT
SOLUTION ELECTROLUBE (MISCELLANEOUS) ×2 IMPLANT
SPIKE FLUID TRANSFER (MISCELLANEOUS) ×2 IMPLANT
STAPLER 60 DA VINCI SURE FORM (STAPLE) ×2
STAPLER 60 SUREFORM DVNC (STAPLE) ×2 IMPLANT
STAPLER CANNULA SEAL DVNC XI (STAPLE) ×2 IMPLANT
STAPLER CANNULA SEAL XI (STAPLE) ×2
STAPLER RELOAD 2.5X60 WHITE (STAPLE) ×16
STAPLER RELOAD 2.5X60 WHT DVNC (STAPLE) ×16
STAPLER RELOAD 3.5X60 BLU DVNC (STAPLE) ×2
STAPLER RELOAD 3.5X60 BLUE (STAPLE) ×2
SUT MNCRL AB 4-0 PS2 18 (SUTURE) ×2 IMPLANT
SUT SILK 2 0 SH (SUTURE) ×4 IMPLANT
SUT V-LOC BARB 180 2/0GR6 GS22 (SUTURE) ×4
SUT VIC AB 2-0 SH 27 (SUTURE)
SUT VIC AB 2-0 SH 27XBRD (SUTURE) IMPLANT
SUT VICRYL 0 TIES 12 18 (SUTURE) ×2 IMPLANT
SUT VLOC BARB 180 ABS3/0GR12 (SUTURE) ×4
SUTURE V-LC BRB 180 2/0GR6GS22 (SUTURE) ×4 IMPLANT
SUTURE VLOC BRB 180 ABS3/0GR12 (SUTURE) ×4 IMPLANT
SYR 20ML LL LF (SYRINGE) ×2 IMPLANT
TOWEL OR 17X26 10 PK STRL BLUE (TOWEL DISPOSABLE) ×2 IMPLANT
TRAY FOLEY MTR SLVR 16FR STAT (SET/KITS/TRAYS/PACK) IMPLANT
TROCAR ADV FIXATION 12X100MM (TROCAR) IMPLANT
TROCAR Z-THREAD FIOS 5X100MM (TROCAR) ×2 IMPLANT
TUBE CALIBRATION LAPBAND (TUBING) IMPLANT

## 2022-09-30 NOTE — Discharge Instructions (Signed)

## 2022-09-30 NOTE — Op Note (Signed)
[name]: Elizabeth [name] (1976/04/28, [id number])  Date of Surgery: 09/30/2022   Preoperative Diagnosis: MORBID OBESITY, HIATAL HERNIA  Postoperative Diagnosis: MORBID OBESITY, HIATAL HERNIA  Surgical Procedure: ROBOTIC GASTRIC BYPASS ROUX EN Y: [id number] (CPT) UPPER GI ENDOSCOPY: [id number] HERNIA REPAIR HIATAL:    Operative Team Members:  Surgeon(s) and Role:    * Everrett Lacasse, Nickola Major, MD - Primary    * Clovis Riley, MD - Assisting   Anesthesiologist: Brennan Bailey, MD CRNA: Victoriano Lain, CRNA; Niel Hummer, CRNA   Anesthesia: General   Fluids:  Total I/O In: 1000 [I.V.:1000] Out: 400 [Urine:300; VQQVZ:563]  Complications: none  Drains:  none   Specimen: * No specimens in log *   Disposition:  PACU - hemodynamically stable.  Plan of Care: Admit to inpatient     Indications for Procedure:  Elizabeth [name] is a 46 y.o. female who is seen for initial bariatric surgery consultation. The [name] has morbid obesity with a BMI of Body mass index is 56 kg/m. and the following conditions related to obesity: Hiatal hernia, hyperlipidemia, type 2 diabetes, and GERD.  Today we discussed the surgical options to treat obesity and its associated comorbidity. After discussing the available procedures in the region, we discussed in great detail the surgeries I offer: robotic sleeve gastrectomy and robotic roux-en-y gastric bypass. We discussed the procedures themselves as well as their risks, benefits and alternatives. I entered the [name]'s basic information into the [facility] Metabolic Surgery Risk/Benefit Calculator to facilitate this discussion.   After a full discussion and all questions answered, the [name] is interested in pursuing a robotic gastric bypass with hiatal hernia repair and upper endoscopy.  She has completed the bariatric surgery preoperative pathway to include the following: - Bloodwork - Dietician consult - Chest x-ray - EKG - Psychology  evaluation - Sleep study - Upper endoscopy with biopsy - completed last year, see above.  [name] presents today for surgery. We again discussed the procedure itself as well as its risk, benefits, and alternatives. After full discussion all questions answered the [name] granted consent to proceed. We will proceed as scheduled.     Findings: Hiatal hernia  Infection status: [name]: Elizabeth [name] Elective Case Case: Elective Infection Present At Time Of Surgery (PATOS): Some spillage of foregut  and jejunal contents while creating anastomoses   Description of Procedure:   On the date stated above, the [name] was taken to the operating room suite and placed in supine positioning.  General endotracheal anesthesia was induced.  A timeout was completed verifying the correct [name], procedure, positioning and equipment needed for the case.  The [name]'s abdomen was prepped and draped in the usual sterile fashion.  A 5 mm trocar was used to enter the right upper quadrant using optical technique.  The abdomen was entered safely without any trauma the underlying viscera.  Three additional incisions were made and 4 robotic trochars were placed across the abdomen, replacing the 5 mm trocar with the 12 mm robotic stapler trocar.  The [facility] liver retractor was placed through the subxiphoid region and under the left lobe of the liver and was connected to the rail of the bed.  A TAP block was placed using marcaine and Exparel under direct vision of the laparoscope.  The Dresser was docked and we transitioned to robotic surgery.   A hiatal hernia was identified and repaired.    The gastrohepatic ligament was divided and the right crus was identified.  A blunt dissection was carried out between the right crus and the esophagus.  The phrenoesophageal ligament was divided and dissection was carried up over the top of the esophagus towards the left crus.  The fundus was partially  mobilized off of the left hemidiaphragm.    All posterior gastric attachments to the lesser sac and retroperitoneum were divided.  The left crus was further delineated.  A retroesophageal window was created and used to for esophageal retraction.    A high, circumferential mediastinal dissection was performed in an effort to mobilize the esophagus and provide for adequate intraabdominal esophageal length.  The mediastinal dissection was performed bluntly, with little to no thermal energy.  The anterior and posterior vagus nerves were both identified and preserved.  The crural defect was reapproximated with three, interrupted, 0 silk sutures.  The crural pillars came together well without tearing of the adjacent diaphragmatic tissue.    Using the tip up grasper, fenestrated bipolar, 30 degree camera and Vessel Sealer from the [name]'s right to left, we began by dissecting the angle of His off the left crus of the diaphragm.  The adhesions between the stomach, spleen and diaphragm were divided using the Vessel Sealer to define the angle of His.  I then started 4-6 cm down on the lesser curve of the stomach and created a defect in the gastrohepatic ligament tracking behind the lesser curve of the stomach to enter the lesser sac.  I then used multiple white loads of the robotic 60 mm Sureform linear stapler to form the gastric pouch.  I then directed my attention to the lower abdomen.  The omentum was divided with the Vessel Sealer and I identified the ligament of treitz.  The jejunum was run to a point 50 cm from the ligament of Treitz.  This loop of bowel was then brought into the left upper quadrant, over the transverse colon, between the split omentum.  A 2-0 v-loc suture was used to create the posterior outer row of the gastrojejunal anastomosis.  An approximately 2 cm gastrotomy was made in the pouch and a matching 2 cm enterotomy was created in the roux limb.  Then, two 3-0 v-loc sutures were used to  create a posterior, inner, full thickness layer of the anastomosis.  While sewing the anterior inner layer, the Ewald tube was passed through the anastomosis to ensure patency.  The outer, anterior layer was then created using 2-0 v-loc suture.  A window was made in the jejunal mesentery and the jejunum was divided just proximal to the gastrojejunal anastomosis using a white load of the robotic 60 mm Sureform linear stapler to divide the roux limb from the hepatobiliary limb.  At this point the gastrojejunal anastomosis was complete and the Ewald tube was removed.  The roux limb was then run 100 cm from the gastrojejunal anastomosis.  This loop of bowel was brought into the left upper quadrant for anastomosis to the hepatobiliary limb.  A robotic 60 mm white load on the Sureform linear stapler was introduced into both limbs and fired to create the jejunojejunostomy.  A second 60 mm white load of the Sureform linear stapler was fired to connect the distal Roux limb to the proximal common channel creating a W shaped anastomosis.  The common enterotomy was closed with a final 60 mm white load of the Sureform linear stapler.  The jejunojejunostomy mesenteric defect was closed using running 0-Ethibond suture.   The retro-roux defect was closed, closing the roux limb  mesentery to the transverse mesocolon mesentery using a running 0-Ethibond suture.   I ran the roux limb from the gastrojejunal anastomosis to the jejunojejunostomy and found the anatomy as expected without any twisting of the mesentery.  The intra-abdominal pressure was decreased to 52mHg for the remainder of the case to check for hemostasis.  I then transitioned to endoscopic portion of the procedure.  The adult upper endoscope was inserted into the pouch.  The pouch appeared appropriately sized.  There was good intra-luminal hemostasis.  The endoscope was inserted through the anastomosis into the roux limb.  The anastomosis appeared well formed without  any stricture.  The anastomosis was submerged in irrigation in the left upper quadrant and there was no leakage of air bubbles with endoscopic insufflation suggesting a negative leak test and an air tight anastomosis.    The foregut was decompressed with the endoscope and the endoscope was removed.  The robotic instruments were removed and the robot was undocked.  The stapler port in the right abdomen was closed at the fascial level using 0-vicryl on a PMI suture passer.  The liver retractor was removed under direct vision.  The pneumoperitoneum was evacuated.  The skin was closed using 4-0 Monocryl and Dermabond.  All sponge and needle counts were correct at the end of the case.    PLouanna Raw MD General, Bariatric, & Minimally Invasive Surgery [facility], [place]

## 2022-09-30 NOTE — Anesthesia Postprocedure Evaluation (Signed)
Anesthesia Post Note  Patient: [employer] social research officer  Procedure(s) Performed: ROBOTIC GASTRIC BYPASS ROUX EN Y (Abdomen) UPPER GI ENDOSCOPY (Esophagus) HERNIA REPAIR HIATAL (Abdomen)     Patient location during evaluation: PACU Anesthesia Type: General Level of consciousness: awake and alert Pain management: pain level controlled Vital Signs Assessment: post-procedure vital signs reviewed and stable Respiratory status: spontaneous breathing, nonlabored ventilation and respiratory function stable Cardiovascular status: blood pressure returned to baseline Postop Assessment: no apparent nausea or vomiting Anesthetic complications: no   No notable events documented.  Last Vitals:  Vitals:   09/30/22 1200 09/30/22 1215  BP: 136/89 134/87  Pulse: (!) 106 (!) 108  Resp: 18 18  Temp:    SpO2: 94% 94%    Last Pain:  Vitals:   09/30/22 1215  TempSrc:   PainSc: [place]

## 2022-09-30 NOTE — Anesthesia Procedure Notes (Signed)
Procedure Name: Intubation Date/Time: 09/30/2022 7:39 AM  Performed by: Niel Hummer, CRNAPre-anesthesia Checklist: Patient identified, Emergency Drugs available, Suction available and Patient being monitored Patient Re-evaluated:Patient Re-evaluated prior to induction Oxygen Delivery Method: Circle system utilized Preoxygenation: Pre-oxygenation with 100% oxygen Induction Type: IV induction Ventilation: Mask ventilation without difficulty Laryngoscope Size: 4 and Mac Grade View: Grade I Tube type: Oral Tube size: 7.0 mm Number of attempts: 1 Airway Equipment and Method: Stylet Placement Confirmation: ETT inserted through vocal cords under direct vision, positive ETCO2 and breath sounds checked- equal and bilateral Secured at: 22 cm Tube secured with: Tape Dental Injury: Teeth and Oropharynx as per pre-operative assessment

## 2022-09-30 NOTE — Transfer of Care (Signed)
Immediate Anesthesia Transfer of Care Note  Patient: Elizabeth [name]  Procedure(s) Performed: ROBOTIC GASTRIC BYPASS ROUX EN Y (Abdomen) UPPER GI ENDOSCOPY (Esophagus) HERNIA REPAIR HIATAL (Abdomen)  Patient Location: PACU  Anesthesia Type:General  Level of Consciousness: drowsy  Airway & Oxygen Therapy: Patient Spontanous Breathing and Patient connected to face mask oxygen  Post-op Assessment: Report given to RN, Post -op Vital signs reviewed and stable, and Patient moving all extremities X 4  Post vital signs: Reviewed and stable  Last Vitals:  Vitals Value Taken Time  BP 153/95   Temp    Pulse 102 09/30/22 1103  Resp 25 09/30/22 1103  SpO2 94 % 09/30/22 1103  Vitals shown include unvalidated device data.  Last Pain:  Vitals:   09/30/22 0621  TempSrc:   PainSc: 0-No pain      Patients Stated Pain Goal: 3 (91/79/15 0569)  Complications: No notable events documented.

## 2022-09-30 NOTE — Progress Notes (Signed)
Discussed QI "Goals for Discharge" document with patient including ambulation in halls, Incentive Spirometry use every hour, and oral care.  Also discussed pain and nausea control.  BSTOP education provided including BSTOP information guide, "Guide for Pain Management after your Bariatric Procedure".  Diet progression education provided including "Bariatric Surgery Post-Op Food Plan Phase 1: Liquids".  Questions answered.  Will continue to partner with bedside RN and follow up with patient per protocol.

## 2022-09-30 NOTE — H&P (Signed)
Admitting Physician: [place]  Service: Bariatric surgery  CC: Obesity  Subjective   HPI: Elizabeth [name] is an 46 y.o. female who is here for robotic gastric bypass with hiatal hernia repair and upper endoscopy.  Past Medical History:  Diagnosis Date   Anemia    Anxiety    Arthritis of left knee    Astigmatism    Depression    Diabetes mellitus without complication (HCC)    Dry mouth    Fatigue    GERD (gastroesophageal reflux disease)    Heartburn    High cholesterol    Hypertension    Keratoconjunctivitis    Liver disease    Muscle pain    Vitamin D deficiency    Weakness     Past Surgical History:  Procedure Laterality Date   BIOPSY  01/18/2021   Procedure: BIOPSY;  Surgeon: Lavena Bullion, DO;  Location: [facility];  Service: Gastroenterology;;   CHOLECYSTECTOMY N/A 05/11/2021   Procedure: LAPAROSCOPIC CHOLECYSTECTOMY WITH INTRAOPERATIVE CHOLANGIOGRAM;  Surgeon: Felicie Morn, MD;  Location: [facility] OR[facility];  Service: General;  Laterality: N/A;   DILATION AND CURETTAGE OF UTERUS     ENDOMETRIAL BIOPSY  12/19/2017   normal per pt   ESOPHAGOGASTRODUODENOSCOPY (EGD) WITH PROPOFOL N/A 01/18/2021   Procedure: ESOPHAGOGASTRODUODENOSCOPY (EGD) WITH PROPOFOL;  Surgeon: Lavena Bullion, DO;  Location: [facility];  Service: Gastroenterology;  Laterality: N/A;   TONSILLECTOMY     TUBAL LIGATION      Family History  Problem Relation Age of Onset   Diabetes Mother    Hypertension Mother    Hyperlipidemia Mother    Obesity Mother    Peripheral Artery Disease Mother    HIV Father        died from complications (IVDU)   Alcohol abuse Father    Drug abuse Father    Ovarian cancer Maternal Grandmother    Colon cancer Neg Hx    Esophageal cancer Neg Hx    Liver disease Neg Hx    Pancreatic cancer Neg Hx    Stomach cancer Neg Hx    Sleep apnea Neg Hx     Social:  reports that she quit smoking about 13 months ago. Her smoking use included  cigarettes. She has a 1.50 pack-year smoking history. She has never used smokeless tobacco. She reports that she does not drink alcohol and does not use drugs.  Allergies:  Allergies  Allergen Reactions   Tomato Hives, Itching and Rash    Medications: Current Outpatient Medications  Medication Instructions   amLODipine (NORVASC) 10 MG tablet TAKE 1 TABLET BY MOUTH ONCE DAILY   Blood Glucose Monitoring Suppl (FREESTYLE LITE) w/Device KIT Use to check blood sugar 4 times a day as directed   escitalopram (LEXAPRO) 10 MG tablet Take 1&1/2 tablets by mouth daily (15 mg)   etonogestrel (NEXPLANON) 68 MG IMPL implant 1 each, Subdermal,  Once   famotidine (PEPCID) 20 MG tablet Take 1 tablet (20 mg total) by mouth daily. Please call [phone] to schedule office visit   glipiZIDE (GLIPIZIDE XL) 5 MG 24 hr tablet Take 1 tablet by mouth with food every morning once a day   glucose blood (FREESTYLE LITE) test strip Use to check blood sugar 4 times a day as directed   hydrOXYzine (ATARAX) 25 MG tablet Take 2 tablets by mouth at bedtime as needed for insomnia.   Lancets (FREESTYLE) lancets Use to check blood sugar 4 times a day as directed  lisinopril (ZESTRIL) 40 MG tablet TAKE 1 TABLET BY MOUTH ONCE DAILY   lovastatin (MEVACOR) 40 MG tablet TAKE 1 TABLET BY MOUTH AT BEDTIME.   meloxicam (MOBIC) 7.5 mg, Oral, Daily PRN   norethindrone (ORTHO MICRONOR) 0.35 mg, Oral, Daily   pantoprazole (PROTONIX) 40 mg, Oral, 2 times daily   TRULICITY 1.5 IY/6.4BR SOPN Inject 1.5 mg under the skin once a week - STOP the 3.0 mg pens   Trulicity 1.5 mg, Subcutaneous, Weekly   Vitamin D (Ergocalciferol) (DRISDOL) 50,000 Units, Oral, Every 7 days   XIGDUO XR 02-999 MG TB24 Take 2 tablets by mouth daily before breakfast.    ROS - all of the below systems have been reviewed with the patient and positives are indicated with bold text General: chills, fever or night sweats Eyes: blurry vision or double vision ENT:  epistaxis or sore throat Allergy/Immunology: itchy/watery eyes or nasal congestion Hematologic/Lymphatic: bleeding problems, blood clots or swollen lymph nodes Endocrine: temperature intolerance or unexpected weight changes Breast: new or changing breast lumps or nipple discharge Resp: cough, shortness of breath, or wheezing CV: chest pain or dyspnea on exertion GI: as per HPI GU: dysuria, trouble voiding, or hematuria MSK: joint pain or joint stiffness Neuro: TIA or stroke symptoms Derm: pruritus and skin lesion changes Psych: anxiety and depression  Objective   PE Blood pressure (!) 156/99, pulse 85, temperature 98.8 F (37.1 C), temperature source Oral, resp. rate 16, height _0  (1.575 m), weight (!) 139.3 kg, SpO2 100 %. Constitutional: NAD; conversant; no deformities Eyes: Moist conjunctiva; no lid lag; anicteric; PERRL Neck: Trachea midline; no thyromegaly Lungs: Normal respiratory effort; no tactile fremitus CV: RRR; no palpable thrills; no pitting edema GI: Abd Soft, nontender; no palpable hepatosplenomegaly MSK: Normal range of motion of extremities; no clubbing/cyanosis Psychiatric: Appropriate affect; alert and oriented x3 Lymphatic: No palpable cervical or axillary lymphadenopathy  Results for orders placed or performed during the hospital encounter of 09/30/22 (from the past 24 hour(s))  Glucose, capillary     Status: Abnormal   Collection Time: 09/30/22  6:17 AM  Result Value Ref Range   Glucose-Capillary 194 (H) 70 - 99 mg/dL   Comment 1 Notify RN    Comment 2 Document in Chart   Pregnancy, urine POC     Status: None   Collection Time: 09/30/22  6:19 AM  Result Value Ref Range   Preg Test, Ur NEGATIVE NEGATIVE  ABO/Rh     Status: None (Preliminary result)   Collection Time: 09/30/22  6:28 AM  Result Value Ref Range   ABO/RH(D) PENDING     Imaging Orders  No imaging studies ordered today  Upper endoscopy 01/18/21 with Dr. Bryan Lemma: - Thee small  islands of salmon-colored mucosa in the distal esophagus, located 1 cm proximal to an otherwise normal appearing Z line. Biopsied. - 3 cm hiatal hernia. - Normal stomach. Biopsied. - Normal examined duodenum. Biopsied. A. DUODENUM, BIOPSY:  - Duodenal mucosa with no significant histopathologic changes.  - No features of celiac sprue or granulomas.  B. GASTRIC, BIOPSY:  - Gastric antral and oxyntic mucosa with no significant histopathologic  changes.  - Warthin-Starry negative for Helicobacter pylori.  - No intestinal metaplasia, dysplasia or carcinoma.  C. GE JUNCTION, BIOPSY:  - Gastroesophageal mucosal with mild inflammation consistent with  reflux.  - No intestinal metaplasia, dysplasia or carcinoma.    Assessment and Plan   Diagnoses and all orders for this visit:  Morbid obesity with body mass index (  BMI) of 50.0 to 59.9 in adult (CMS-HCC)  Gastroesophageal reflux disease, unspecified whether esophagitis present  Diabetes mellitus type 2 in obese (CMS-HCC)  Hyperlipidemia, unspecified hyperlipidemia type  Hiatal hernia   Elizabeth [name] is a 46 y.o. female who is seen for initial bariatric surgery consultation. The patient has morbid obesity with a BMI of Body mass index is 56 kg/m. and the following conditions related to obesity: Hiatal hernia, hyperlipidemia, type 2 diabetes, and GERD.  Today we discussed the surgical options to treat obesity and its associated comorbidity. After discussing the available procedures in the region, we discussed in great detail the surgeries I offer: robotic sleeve gastrectomy and robotic roux-en-y gastric bypass. We discussed the procedures themselves as well as their risks, benefits and alternatives. I entered the patient's basic information into the [facility] Metabolic Surgery Risk/Benefit Calculator to facilitate this discussion.   After a full discussion and all questions answered, the patient is interested in pursuing a robotic gastric  bypass with hiatal hernia repair and upper endoscopy.  She has completed the bariatric surgery preoperative pathway to include the following: - Bloodwork - Dietician consult - Chest x-ray - EKG - Psychology evaluation - Sleep study - Upper endoscopy with biopsy - completed last year, see above.  Patient presents today for surgery. We again discussed the procedure itself as well as its risk, benefits, and alternatives. After full discussion all questions answered the patient granted consent to proceed. We will proceed as scheduled.     Felicie Morn, MD  [facility] Use [email or web address] to contact on call provider

## 2022-09-30 NOTE — Progress Notes (Signed)
PHARMACY CONSULT FOR:  Risk Assessment for Post-Discharge VTE Following Bariatric Surgery  Post-Discharge VTE Risk Assessment: This patient's probability of 30-day post-discharge VTE is increased due to the factors marked:  Sleeve gastrectomy   Liver disorder (transplant, cirrhosis, or nonalcoholic steatohepatitis)   Hx of VTE   Hemorrhage requiring transfusion   GI perforation, leak, or obstruction   ====================================================    Female    Age >/=60 years   X BMI >/=50 kg/m2    CHF    Dyspnea at Rest    Paraplegia   X Non-gastric-band surgery    Operation Time >/=3 hr    Return to OR     Length of Stay >/= 3 d   Hypercoagulable condition   Significant venous stasis    Predicted probability of 30-day post-discharge VTE:   0.27%      MILD     Other patient-specific factors to consider:   Recommendation for Discharge: No pharmacologic prophylaxis post-discharge  Elizabeth [name] is a 46 y.o. female who underwent robotic gastric bypass with hiatal hernia repair and upper endoscopy on 09/30/2022.     Case start: 0809 Case end: 1055   Allergies  Allergen Reactions   Tomato Hives, Itching and Rash    Patient Measurements: Height: _0  (157.5 cm) Weight: (!) 139.3 kg (307 lb) IBW/kg (Calculated) : 50.1 Body mass index is 56.15 kg/m.  Recent Labs    09/30/22 1253  WBC 12.7*  HGB 13.1  HCT 43.3  PLT 325   Estimated Creatinine Clearance: 121.6 mL/min (by C-G formula based on SCr of 0.76 mg/dL).    Past Medical History:  Diagnosis Date   Anemia    Anxiety    Arthritis of left knee    Astigmatism    Depression    Diabetes mellitus without complication (HCC)    Dry mouth    Fatigue    GERD (gastroesophageal reflux disease)    Heartburn    High cholesterol    Hypertension    Keratoconjunctivitis    Liver disease    Muscle pain    Vitamin D deficiency    Weakness      Medications Prior to Admission  Medication Sig Dispense  Refill Last Dose   amLODipine (NORVASC) 10 MG tablet TAKE 1 TABLET BY MOUTH ONCE DAILY 90 tablet 1 09/30/2022 at 0430   Dulaglutide (TRULICITY) 1.5 KD/3.2IZ SOPN Inject 1.5 mg into the skin once a week. 2 mL 5 09/22/2022   escitalopram (LEXAPRO) 10 MG tablet Take 1&1/2 tablets by mouth daily (15 mg) 45 tablet 1 09/30/2022 at 0430   etonogestrel (NEXPLANON) 68 MG IMPL implant 1 each by Subdermal route once.      famotidine (PEPCID) 20 MG tablet Take 1 tablet (20 mg total) by mouth daily. Please call [phone] to schedule office visit 90 tablet 0 09/29/2022   glipiZIDE (GLIPIZIDE XL) 5 MG 24 hr tablet Take 1 tablet by mouth with food every morning once a day 30 tablet 5 09/29/2022   hydrOXYzine (ATARAX) 25 MG tablet Take 2 tablets by mouth at bedtime as needed for insomnia. 60 tablet 3 09/29/2022   lisinopril (ZESTRIL) 40 MG tablet TAKE 1 TABLET BY MOUTH ONCE DAILY 90 tablet 1 09/29/2022   lovastatin (MEVACOR) 40 MG tablet TAKE 1 TABLET BY MOUTH AT BEDTIME. 90 tablet 1 09/29/2022   norethindrone (ORTHO MICRONOR) 0.35 MG tablet Take 1 tablet (0.35 mg total) by mouth daily. 28 tablet 11 Past Month   pantoprazole (PROTONIX) 40 MG tablet  Take 1 tablet (40 mg total) by mouth 2 (two) times daily. 180 tablet 0 09/30/2022 at 0430   Vitamin D, Ergocalciferol, (DRISDOL) 1.25 MG (50000 UNIT) CAPS capsule Take 1 capsule (50,000 Units total) by mouth every 7 (seven) days. 12 capsule 0 09/29/2022   XIGDUO XR 02-999 MG TB24 Take 2 tablets by mouth daily before breakfast. 180 tablet 0 09/28/2022   Blood Glucose Monitoring Suppl (FREESTYLE LITE) w/Device KIT Use to check blood sugar 4 times a day as directed 1 kit 0    glucose blood (FREESTYLE LITE) test strip Use to check blood sugar 4 times a day as directed 100 each 0    Lancets (FREESTYLE) lancets Use to check blood sugar 4 times a day as directed 100 each 0    meloxicam (MOBIC) 7.5 MG tablet Take 1 tablet (7.5 mg total) by mouth daily as needed for pain. 30  tablet 1 More than a month   TRULICITY 1.5 FX/8.3AN SOPN Inject 1.5 mg under the skin once a week - STOP the 3.0 mg pens (Patient not taking: Reported on 09/05/2022) 2 mL 5 Not Taking    Gretta Arab PharmD, BCPS [facility] main pharmacy [phone] 09/30/2022 1:16 PM

## 2022-10-01 ENCOUNTER — Other Ambulatory Visit (HOSPITAL_COMMUNITY): Payer: Self-pay

## 2022-10-01 ENCOUNTER — Other Ambulatory Visit (HOSPITAL_BASED_OUTPATIENT_CLINIC_OR_DEPARTMENT_OTHER): Payer: Self-pay

## 2022-10-01 ENCOUNTER — Encounter (HOSPITAL_COMMUNITY): Payer: Self-pay | Admitting: Surgery

## 2022-10-01 LAB — GLUCOSE, CAPILLARY
Glucose-Capillary: 167 mg/dL — ABNORMAL HIGH (ref 70–99)
Glucose-Capillary: 170 mg/dL — ABNORMAL HIGH (ref 70–99)
Glucose-Capillary: 177 mg/dL — ABNORMAL HIGH (ref 70–99)
Glucose-Capillary: 223 mg/dL — ABNORMAL HIGH (ref 70–99)
Glucose-Capillary: 235 mg/dL — ABNORMAL HIGH (ref 70–99)

## 2022-10-01 LAB — CBC WITH DIFFERENTIAL/PLATELET
Abs Immature Granulocytes: 0.02 10*3/uL (ref 0.00–0.07)
Basophils Absolute: 0 10*3/uL (ref 0.0–0.1)
Basophils Relative: 0 %
Eosinophils Absolute: 0 10*3/uL (ref 0.0–0.5)
Eosinophils Relative: 0 %
HCT: 41.6 % (ref 36.0–46.0)
Hemoglobin: 12.7 g/dL (ref 12.0–15.0)
Immature Granulocytes: 0 %
Lymphocytes Relative: 14 %
Lymphs Abs: 1.4 10*3/uL (ref 0.7–4.0)
MCH: 22.9 pg — ABNORMAL LOW (ref 26.0–34.0)
MCHC: 30.5 g/dL (ref 30.0–36.0)
MCV: 75.1 fL — ABNORMAL LOW (ref 80.0–100.0)
Monocytes Absolute: 0.7 10*3/uL (ref 0.1–1.0)
Monocytes Relative: 7 %
Neutro Abs: 7.3 10*3/uL (ref 1.7–7.7)
Neutrophils Relative %: 79 %
Platelets: 298 10*3/uL (ref 150–400)
RBC: 5.54 MIL/uL — ABNORMAL HIGH (ref 3.87–5.11)
RDW: 15.7 % — ABNORMAL HIGH (ref 11.5–15.5)
WBC: 9.4 10*3/uL (ref 4.0–10.5)
nRBC: 0 % (ref 0.0–0.2)

## 2022-10-01 MED ORDER — OXYCODONE HCL 5 MG PO TABS
5.0000 mg | ORAL_TABLET | Freq: Four times a day (QID) | ORAL | 0 refills | Status: DC | PRN
  Filled 2022-10-01: qty 10, 3d supply, fill #0

## 2022-10-01 MED ORDER — PANTOPRAZOLE SODIUM 40 MG PO TBEC
40.0000 mg | DELAYED_RELEASE_TABLET | Freq: Every day | ORAL | 0 refills | Status: DC
  Filled 2022-10-01: qty 90, 90d supply, fill #0

## 2022-10-01 MED ORDER — GABAPENTIN 100 MG PO CAPS
200.0000 mg | ORAL_CAPSULE | Freq: Two times a day (BID) | ORAL | 0 refills | Status: DC
  Filled 2022-10-01: qty 20, 5d supply, fill #0

## 2022-10-01 MED ORDER — ONDANSETRON 4 MG PO TBDP
4.0000 mg | ORAL_TABLET | Freq: Four times a day (QID) | ORAL | 0 refills | Status: DC | PRN
  Filled 2022-10-01: qty 20, 5d supply, fill #0

## 2022-10-01 MED ORDER — ACETAMINOPHEN 500 MG PO TABS: 1000.0000 mg | ORAL_TABLET | Freq: Three times a day (TID) | ORAL | 0 refills | Status: AC

## 2022-10-01 NOTE — Progress Notes (Signed)
Progress Note: General Surgery Service   Chief Complaint/Subjective: Doing well POD1.  Indigstion.  Drinking plenty of liquids though.  Objective: Vital signs in last 24 hours: Temp:  [97.6 F (36.4 C)-98.1 F (36.7 C)] 98.1 F (36.7 C) (12/12 0213) Pulse Rate:  [89-111] 89 (12/12 0213) Resp:  [16-25] 16 (12/12 0213) BP: (128-168)/(80-110) 140/85 (12/12 0213) SpO2:  [90 %-98 %] 94 % (12/12 0213) Last BM Date : 09/29/22  Intake/Output from previous day: 12/11 0701 - 12/12 0700 In: 2635.8 [P.O.:420; I.V.:2215.8] Out: 0354 [Urine:4050; Blood:100] Intake/Output this shift: No intake/output data recorded.  GI: Abd Incisions c/d/I w/ glue  Lab Results: CBC  Recent Labs    09/30/22 1253 09/30/22 1728  WBC 12.7*  --   HGB 13.1 13.9  HCT 43.3 46.1*  PLT 325  --    BMET Recent Labs    09/30/22 1253  CREATININE 0.78   PT/INR No results for input(s): "LABPROT", "INR" in the last 72 hours. ABG No results for input(s): "PHART", "HCO3" in the last 72 hours.  Invalid input(s): "PCO2", "PO2"  Anti-infectives: Anti-infectives (From admission, onward)    Start     Dose/Rate Route Frequency Ordered Stop   09/30/22 0600  cefoTEtan (CEFOTAN) 2 g in sodium chloride 0.9 % 100 mL IVPB  Status:  Discontinued        2 g 200 mL/hr over 30 Minutes Intravenous On call to O.R. 09/30/22 0536 09/30/22 1300       Medications: Scheduled Meds:  acetaminophen  1,000 mg Oral Q8H   Or   acetaminophen (TYLENOL) oral liquid 160 mg/5 mL  1,000 mg Oral Q8H   amLODipine  10 mg Oral Daily   escitalopram  15 mg Oral Daily   heparin injection (subcutaneous)  5,000 Units Subcutaneous Q8H   insulin aspart  0-20 Units Subcutaneous Q4H   lisinopril  40 mg Oral Daily   pantoprazole (PROTONIX) IV  40 mg Intravenous QHS   pravastatin  40 mg Oral q1800   Ensure Max Protein  2 oz Oral Q2H   Continuous Infusions:  lactated ringers 75 mL/hr at 10/01/22 0518   PRN Meds:.hydrOXYzine, morphine  injection, ondansetron (ZOFRAN) IV, oxyCODONE, simethicone  Assessment/Plan: s/p Procedure(s): ROBOTIC GASTRIC BYPASS ROUX EN Y UPPER GI ENDOSCOPY HERNIA REPAIR HIATAL 09/30/2022  Doing fine POD1 Continue protocol   LOS: 1 day     Felicie Morn, MD  [facility] Use [email or web address] to contact on call provider  Daily Billing: [phone] - post op

## 2022-10-01 NOTE — Discharge Summary (Signed)
Patient ID: Elizabeth [name] [id number] 46 y.o. November 22, 1975  09/30/2022  Discharge date and time: 10/01/2022  Admitting Physician: [place]  Discharge Physician: [place]  Admission Diagnoses: Morbid obesity with BMI of 50.0-59.9, adult ([place]) [E66.01, Z68.43] Patient Active Problem List   Diagnosis Date Noted   Osteoarthritis of knee 03/19/2022   Left knee pain 02/22/2022   Preventative health care 12/31/2021   Drug-induced constipation 12/31/2021   Depression 11/07/2021   Chronic abdominal pain 04/06/2021   Grief reaction 04/06/2021   Gastroesophageal reflux disease without esophagitis    Hyperlipidemia 01/21/2018   Uncontrolled type 2 diabetes mellitus with hyperglycemia ([place]) 01/21/2018   Keratoconjunctivitis sicca of both eyes not specified as Sjogren's 04/04/2017   Regular astigmatism of both eyes 04/04/2017   Vitamin D deficiency 12/02/2016   Essential hypertension 02/07/2016   Morbid obesity with BMI of 50.0-59.9, adult ([place]) 02/07/2016     Discharge Diagnoses:  Patient Active Problem List   Diagnosis Date Noted   Osteoarthritis of knee 03/19/2022   Left knee pain 02/22/2022   Preventative health care 12/31/2021   Drug-induced constipation 12/31/2021   Depression 11/07/2021   Chronic abdominal pain 04/06/2021   Grief reaction 04/06/2021   Gastroesophageal reflux disease without esophagitis    Hyperlipidemia 01/21/2018   Uncontrolled type 2 diabetes mellitus with hyperglycemia ([place]) 01/21/2018   Keratoconjunctivitis sicca of both eyes not specified as Sjogren's 04/04/2017   Regular astigmatism of both eyes 04/04/2017   Vitamin D deficiency 12/02/2016   Essential hypertension 02/07/2016   Morbid obesity with BMI of 50.0-59.9, adult ([place]) 02/07/2016    Operations: Procedure(s): ROBOTIC GASTRIC BYPASS ROUX EN Y UPPER GI ENDOSCOPY HERNIA REPAIR HIATAL  Admission Condition: good  Discharged Condition: good  Indication for Admission:  Obesity  Hospital Course: Robotic gastric bypass with hiatal hernia repair 09/30/22  Consults: None  Significant Diagnostic Studies: None  Treatments: surgery: as above  Disposition: Home  Patient Instructions:  Allergies as of 10/01/2022       Reactions   Tomato Hives, Itching, Rash        Medication List     TAKE these medications    acetaminophen 500 MG tablet Commonly known as: TYLENOL Take 2 tablets (1,000 mg total) by mouth every 8 (eight) hours for 5 days.   amLODipine 10 MG tablet Commonly known as: NORVASC TAKE 1 TABLET BY MOUTH ONCE DAILY Notes to patient: Monitor Blood Pressure Daily and keep a log for primary care physician.  You may need to make changes to your medications with rapid weight loss.      escitalopram 10 MG tablet Commonly known as: Lexapro Take 1&1/2 tablets by mouth daily (15 mg)   famotidine 20 MG tablet Commonly known as: Pepcid Take 1 tablet (20 mg total) by mouth daily. Please call [phone] to schedule office visit   freestyle lancets Use to check blood sugar 4 times a day as directed   FREESTYLE LITE test strip Generic drug: glucose blood Use to check blood sugar 4 times a day as directed   FreeStyle Lite w/Device Kit Use to check blood sugar 4 times a day as directed   gabapentin 100 MG capsule Commonly known as: NEURONTIN Take 2 capsules (200 mg total) by mouth every 12 (twelve) hours.   glipiZIDE 5 MG 24 hr tablet Commonly known as: glipiZIDE XL Take 1 tablet by mouth with food every morning once a day Notes to patient: Monitor Blood Sugar Frequently and keep a log for primary care  physician, you may need to adjust medication dosage with rapid weight loss.      hydrOXYzine 25 MG tablet Commonly known as: ATARAX Take 2 tablets by mouth at bedtime as needed for insomnia.   lisinopril 40 MG tablet Commonly known as: ZESTRIL TAKE 1 TABLET BY MOUTH ONCE DAILY Notes to patient: Monitor Blood Pressure Daily and keep  a log for primary care physician.  You may need to make changes to your medications with rapid weight loss.      lovastatin 40 MG tablet Commonly known as: MEVACOR TAKE 1 TABLET BY MOUTH AT BEDTIME.   meloxicam 7.5 MG tablet Commonly known as: MOBIC Take 1 tablet (7.5 mg total) by mouth daily as needed for pain. Notes to patient: Avoid NSAIDs after surgery due to risk of GI (stomach) ulcer.    Nexplanon 68 MG Impl implant Generic drug: etonogestrel 1 each by Subdermal route once. Notes to patient: Medication may not be effective for the next 30 days.  Use precaution if necessary.     norethindrone 0.35 MG tablet Commonly known as: Ortho Micronor Take 1 tablet (0.35 mg total) by mouth daily. Notes to patient: Medication may not be effective for the next 30 days.  Use precaution if necessary.     ondansetron 4 MG disintegrating tablet Commonly known as: ZOFRAN-ODT Take 1 tablet (4 mg total) by mouth every 6 (six) hours as needed for nausea or vomiting.   oxyCODONE 5 MG immediate release tablet Commonly known as: Oxy IR/ROXICODONE Take 1 tablet (5 mg total) by mouth every 6 (six) hours as needed for severe pain.   pantoprazole 40 MG tablet Commonly known as: PROTONIX Take 1 tablet (40 mg total) by mouth 2 (two) times daily. What changed: Another medication with the same name was added. Make sure you understand how and when to take each.   pantoprazole 40 MG tablet Commonly known as: PROTONIX Take 1 tablet (40 mg total) by mouth daily. What changed: You were already taking a medication with the same name, and this prescription was added. Make sure you understand how and when to take each.   Trulicity 1.5 DX/8.3JA Sopn Generic drug: Dulaglutide Inject 1.5 mg under the skin once a week - STOP the 3.0 mg pens   Trulicity 1.5 SN/0.5LZ Sopn Generic drug: Dulaglutide Inject 1.5 mg into the skin once a week.   Vitamin D (Ergocalciferol) 1.25 MG (50000 UNIT) Caps capsule Commonly  known as: DRISDOL Take 1 capsule (50,000 Units total) by mouth every 7 (seven) days.   Xigduo XR 02-999 MG Tb24 Generic drug: Dapagliflozin Pro-metFORMIN ER Take 2 tablets by mouth daily before breakfast. Notes to patient: Monitor Blood Sugar Frequently and keep a log for primary care physician, you may need to adjust medication dosage with rapid weight loss.           Activity: no heavy lifting for 4 weeks Diet:  bariatric diet protocol Wound Care: keep wound clean and dry  Follow-up:  With Dr. Lenell Antu.  Signed: Nickola Major Edessa Jakubowicz General, Bariatric, & Minimally Invasive Surgery [facility] [place]   10/01/2022, 5:02 PM

## 2022-10-01 NOTE — Progress Notes (Signed)
Patient alert and oriented, pain is controlled. Patient is tolerating fluids, advanced to protein shake today, patient is tolerating well. Reviewed Gastric Bypass discharge instructions with patient and patient is able to articulate understanding. Provided information on BELT program, Support Group and [facility] outpatient pharmacy. All questions answered, will continue to monitor.    

## 2022-10-01 NOTE — Progress Notes (Signed)
Mobility Specialist - Progress Note   10/01/22 1110  Mobility  Activity Ambulated with assistance in hallway  Level of Assistance Independent after set-up  Assistive Device  (IV Pole)  Distance Ambulated (ft) 500 ft  Activity Response Tolerated well  Mobility Referral Yes  $Mobility charge 1 Mobility   Pt received in recliner and agreeable to mobility. No complaints during mobility. Pt to room standing after session with all needs met.    [facility]

## 2022-10-01 NOTE — Progress Notes (Signed)
  Transition of Care (TOC) Screening Note   Patient Details  Name: Daysy Santini Date of Birth: October 05, 1976   Transition of [facility]) CM/SW Contact:    Lennart Pall, LCSW Phone Number: 10/01/2022, 10:32 AM    Transition of Care Department [facility]) has reviewed patient and no TOC needs have been identified at this time. We will continue to monitor patient advancement through interdisciplinary progression rounds. If new patient transition needs arise, please place a TOC consult.

## 2022-10-01 NOTE — Progress Notes (Signed)
Mobility Specialist - Progress Note   10/01/22 1517  Mobility  Activity Ambulated independently in hallway  Level of Assistance Independent  Assistive Device None  Distance Ambulated (ft) 265 ft  Activity Response Tolerated well  Mobility Referral Yes  $Mobility charge 1 Mobility   Pt received in bed and agreeable to mobility. No complaints during mobility. Pt to bed after session with all needs met.    [facility]

## 2022-10-03 ENCOUNTER — Telehealth (HOSPITAL_COMMUNITY): Payer: Self-pay | Admitting: *Deleted

## 2022-10-03 NOTE — Telephone Encounter (Signed)
The following are the pt responses regarding the follow-up conversation on 10/03/22 Please tell me about the following areas related to your post-op recovery:     Fluid Intake   average of 48 oz so far  MVI?   will start on the weekend   encouraged pt to sip some fluid  more throughout the day to  stay hyrdrated                 Protein Intake  60g (2 Ensure Max a day) Meeting goals  Calcium?   will start on the weekend                      Nausea/Vomiting?   had some yesterday, spacing out my pills helped Incision?   looks fine, no issues  Meds effective?  took nausea med this morning  after feeling nauseas to  "knock it out" feeling better now                Urine Output?   yes a lot   Pain Level? Control/what meds?   4 out of 5  Light/Dark?  light   took the prescription pain medicine and it helped               BM since d/c?   milk of magnesium starting today, no BM yet Ambulation?   2 laps inside my home  Laxative?  encouraged to call [facility] by Fri. if not                  Incentive Spirometer Use?  still doing that on the hour, going well Follow-up Intervention Needed?   no

## 2022-10-04 ENCOUNTER — Other Ambulatory Visit (HOSPITAL_BASED_OUTPATIENT_CLINIC_OR_DEPARTMENT_OTHER): Payer: Self-pay

## 2022-10-04 MED ORDER — METHOCARBAMOL 500 MG PO TABS
500.0000 mg | ORAL_TABLET | Freq: Three times a day (TID) | ORAL | 0 refills | Status: DC
  Filled 2022-10-04: qty 30, 10d supply, fill #0

## 2022-10-07 ENCOUNTER — Other Ambulatory Visit (HOSPITAL_BASED_OUTPATIENT_CLINIC_OR_DEPARTMENT_OTHER): Payer: Self-pay

## 2022-10-07 MED ORDER — GABAPENTIN 100 MG PO CAPS
300.0000 mg | ORAL_CAPSULE | Freq: Three times a day (TID) | ORAL | 0 refills | Status: DC | PRN
  Filled 2022-10-07: qty 50, 6d supply, fill #0

## 2022-10-15 ENCOUNTER — Encounter: Payer: Self-pay | Admitting: Dietician

## 2022-10-15 ENCOUNTER — Encounter: Payer: No Typology Code available for payment source | Attending: Surgery | Admitting: Dietician

## 2022-10-15 VITALS — Ht 62.0 in | Wt 292.4 lb

## 2022-10-15 DIAGNOSIS — E1165 Type 2 diabetes mellitus with hyperglycemia: Secondary | ICD-10-CM | POA: Insufficient documentation

## 2022-10-15 DIAGNOSIS — E669 Obesity, unspecified: Secondary | ICD-10-CM | POA: Diagnosis present

## 2022-10-15 NOTE — Progress Notes (Signed)
2 Week Post-Operative Nutrition Class   Patient was seen on 10/15/2022 for Post-Operative Nutrition education at the [facility].    Surgery date: 09/30/2022 Surgery type: RYGB  Anthropometrics  Start weight at [facility]: 308.0 lbs (date: 04/29/2022)  Height: 62 in Weight today: 292.4 lb. BMI: 53.48 kg/m2     Clinical  Medical hx: Obesity, hyperlipidemia, HTN, hiatal hernia, T2DM, depression, arthritis, GERD  Medications: Xigduo XR, lisinopril, amlodipine, Trulicity, escitalopram, ondansetron, hydroxyzine, lovastatin, famotidine  Labs: A1C 9.4; HDL 56;  Notable signs/symptoms: none noted Any previous deficiencies? No Bowel Habits: Every day to every other day no complaints   The following the learning objectives were met by the patient during this course: Identifies Phase 3 (Soft, High Proteins) Dietary Goals and will begin from 2 weeks post-operatively to 2 months post-operatively Identifies appropriate sources of fluids and proteins  Identifies appropriate fat sources and healthy verses unhealthy fat types   States protein recommendations and appropriate sources post-operatively Identifies the need for appropriate texture modifications, mastication, and bite sizes when consuming solids Identifies appropriate fat consumption and sources Identifies appropriate multivitamin and calcium sources post-operatively Describes the need for physical activity post-operatively and will follow MD recommendations States when to call healthcare provider regarding medication questions or post-operative complications   Handouts given during class include: Phase 3A: Soft, High Protein Diet Handout Phase 3 High Protein Meals Healthy Fats   Follow-Up Plan: Patient will follow-up at [facility] in 6 weeks for 2 month post-op nutrition visit for diet advancement per MD.

## 2022-10-18 ENCOUNTER — Other Ambulatory Visit (HOSPITAL_BASED_OUTPATIENT_CLINIC_OR_DEPARTMENT_OTHER): Payer: Self-pay

## 2022-10-18 ENCOUNTER — Other Ambulatory Visit: Payer: Self-pay | Admitting: Family

## 2022-10-20 MED ORDER — FREESTYLE LITE TEST VI STRP
ORAL_STRIP | 5 refills | Status: DC
  Filled 2022-10-20: qty 100, 25d supply, fill #0

## 2022-10-20 MED ORDER — ESCITALOPRAM OXALATE 10 MG PO TABS
15.0000 mg | ORAL_TABLET | Freq: Every day | ORAL | 1 refills | Status: DC
  Filled 2022-10-20: qty 45, 30d supply, fill #0
  Filled 2022-11-11 – 2022-11-14 (×3): qty 45, 30d supply, fill #1

## 2022-10-21 ENCOUNTER — Other Ambulatory Visit (HOSPITAL_BASED_OUTPATIENT_CLINIC_OR_DEPARTMENT_OTHER): Payer: Self-pay

## 2022-10-22 ENCOUNTER — Other Ambulatory Visit (HOSPITAL_BASED_OUTPATIENT_CLINIC_OR_DEPARTMENT_OTHER): Payer: Self-pay

## 2022-10-22 ENCOUNTER — Other Ambulatory Visit: Payer: Self-pay

## 2022-10-23 ENCOUNTER — Encounter (HOSPITAL_BASED_OUTPATIENT_CLINIC_OR_DEPARTMENT_OTHER): Payer: Self-pay

## 2022-10-23 ENCOUNTER — Other Ambulatory Visit (HOSPITAL_BASED_OUTPATIENT_CLINIC_OR_DEPARTMENT_OTHER): Payer: Self-pay

## 2022-10-23 ENCOUNTER — Other Ambulatory Visit: Payer: Self-pay | Admitting: Family

## 2022-10-23 MED ORDER — ONDANSETRON 4 MG PO TBDP
4.0000 mg | ORAL_TABLET | Freq: Four times a day (QID) | ORAL | 0 refills | Status: DC | PRN
  Filled 2022-10-23: qty 20, 5d supply, fill #0

## 2022-10-24 ENCOUNTER — Telehealth: Payer: Self-pay | Admitting: Dietician

## 2022-10-24 NOTE — Telephone Encounter (Signed)
RD called pt to verify fluid intake once starting soft, solid proteins 2 week post-bariatric surgery.   Daily Fluid intake: 64 oz Daily Protein intake: 60 grams Bowel Habits: normal, no issue  Concerns/issues:  no issues

## 2022-10-25 ENCOUNTER — Other Ambulatory Visit (HOSPITAL_BASED_OUTPATIENT_CLINIC_OR_DEPARTMENT_OTHER): Payer: Self-pay

## 2022-10-25 DIAGNOSIS — E559 Vitamin D deficiency, unspecified: Secondary | ICD-10-CM | POA: Diagnosis not present

## 2022-10-25 DIAGNOSIS — Z9884 Bariatric surgery status: Secondary | ICD-10-CM | POA: Diagnosis not present

## 2022-10-25 DIAGNOSIS — E1165 Type 2 diabetes mellitus with hyperglycemia: Secondary | ICD-10-CM | POA: Diagnosis not present

## 2022-10-25 DIAGNOSIS — Z634 Disappearance and death of family member: Secondary | ICD-10-CM | POA: Diagnosis not present

## 2022-10-25 DIAGNOSIS — I10 Essential (primary) hypertension: Secondary | ICD-10-CM | POA: Diagnosis not present

## 2022-10-25 DIAGNOSIS — Z6841 Body Mass Index (BMI) 40.0 and over, adult: Secondary | ICD-10-CM | POA: Diagnosis not present

## 2022-10-25 DIAGNOSIS — E7800 Pure hypercholesterolemia, unspecified: Secondary | ICD-10-CM | POA: Diagnosis not present

## 2022-10-25 MED ORDER — TRULICITY 1.5 MG/0.5ML ~~LOC~~ SOAJ
1.5000 mg | SUBCUTANEOUS | 3 refills | Status: DC
  Filled 2022-10-25: qty 6, 84d supply, fill #0
  Filled 2022-11-11 – 2022-11-12 (×2): qty 2, 28d supply, fill #0

## 2022-10-29 ENCOUNTER — Telehealth: Payer: Self-pay | Admitting: *Deleted

## 2022-10-29 NOTE — Telephone Encounter (Signed)
Left patient a message to call and schedule Nexplanon removal and IUD insertion.

## 2022-11-04 ENCOUNTER — Other Ambulatory Visit: Payer: Self-pay

## 2022-11-05 ENCOUNTER — Other Ambulatory Visit (HOSPITAL_BASED_OUTPATIENT_CLINIC_OR_DEPARTMENT_OTHER): Payer: Self-pay

## 2022-11-06 ENCOUNTER — Encounter: Payer: Self-pay | Admitting: Obstetrics & Gynecology

## 2022-11-11 ENCOUNTER — Ambulatory Visit: Payer: Commercial Managed Care - PPO | Admitting: Family

## 2022-11-11 ENCOUNTER — Encounter: Payer: Self-pay | Admitting: Family

## 2022-11-11 ENCOUNTER — Other Ambulatory Visit (HOSPITAL_BASED_OUTPATIENT_CLINIC_OR_DEPARTMENT_OTHER): Payer: Self-pay

## 2022-11-11 ENCOUNTER — Other Ambulatory Visit: Payer: Self-pay | Admitting: Family

## 2022-11-11 ENCOUNTER — Other Ambulatory Visit: Payer: Self-pay

## 2022-11-11 VITALS — BP 125/77 | HR 76 | Temp 98.5°F | Resp 16 | Wt 287.0 lb

## 2022-11-11 DIAGNOSIS — Z6841 Body Mass Index (BMI) 40.0 and over, adult: Secondary | ICD-10-CM

## 2022-11-11 DIAGNOSIS — E1165 Type 2 diabetes mellitus with hyperglycemia: Secondary | ICD-10-CM | POA: Diagnosis not present

## 2022-11-11 DIAGNOSIS — N924 Excessive bleeding in the premenopausal period: Secondary | ICD-10-CM | POA: Insufficient documentation

## 2022-11-11 DIAGNOSIS — I10 Essential (primary) hypertension: Secondary | ICD-10-CM

## 2022-11-11 DIAGNOSIS — E785 Hyperlipidemia, unspecified: Secondary | ICD-10-CM

## 2022-11-11 LAB — COMPREHENSIVE METABOLIC PANEL
ALT: 74 U/L — ABNORMAL HIGH (ref 0–35)
AST: 58 U/L — ABNORMAL HIGH (ref 0–37)
Albumin: 3.8 g/dL (ref 3.5–5.2)
Alkaline Phosphatase: 77 U/L (ref 39–117)
BUN: 9 mg/dL (ref 6–23)
CO2: 24 mEq/L (ref 19–32)
Calcium: 9.2 mg/dL (ref 8.4–10.5)
Chloride: 105 mEq/L (ref 96–112)
Creatinine, Ser: 0.59 mg/dL (ref 0.40–1.20)
GFR: 109.61 mL/min (ref >60.00)
Glucose, Bld: 178 mg/dL — ABNORMAL HIGH (ref 70–99)
Potassium: 4.1 mEq/L (ref 3.5–5.1)
Sodium: 139 mEq/L (ref 135–145)
Total Bilirubin: 0.4 mg/dL (ref 0.2–1.2)
Total Protein: 6.6 g/dL (ref 6.0–8.3)

## 2022-11-11 LAB — CBC WITH DIFFERENTIAL/PLATELET
Basophils Absolute: 0 10*3/uL (ref 0.0–0.1)
Basophils Relative: 0.7 % (ref 0.0–3.0)
Eosinophils Absolute: 0.1 10*3/uL (ref 0.0–0.7)
Eosinophils Relative: 1.8 % (ref 0.0–5.0)
HCT: 39.4 % (ref 36.0–46.0)
Hemoglobin: 12.4 g/dL (ref 12.0–15.0)
Lymphocytes Relative: 38.3 % (ref 12.0–46.0)
Lymphs Abs: 2 10*3/uL (ref 0.7–4.0)
MCHC: 31.4 g/dL (ref 30.0–36.0)
MCV: 73 fl — ABNORMAL LOW (ref 78.0–100.0)
Monocytes Absolute: 0.4 10*3/uL (ref 0.1–1.0)
Monocytes Relative: 6.7 % (ref 3.0–12.0)
Neutro Abs: 2.8 10*3/uL (ref 1.4–7.7)
Neutrophils Relative %: 52.5 % (ref 43.0–77.0)
Platelets: 296 10*3/uL (ref 150.0–400.0)
RBC: 5.39 Mil/uL — ABNORMAL HIGH (ref 3.87–5.11)
RDW: 16.5 % — ABNORMAL HIGH (ref 11.5–15.5)
WBC: 5.3 10*3/uL (ref 4.0–10.5)

## 2022-11-11 MED ORDER — LOVASTATIN 40 MG PO TABS
40.0000 mg | ORAL_TABLET | Freq: Every day | ORAL | 11 refills | Status: DC
  Filled 2022-11-11: qty 30, 30d supply, fill #0
  Filled 2022-12-17: qty 30, 30d supply, fill #1
  Filled 2023-02-19: qty 30, 30d supply, fill #2

## 2022-11-11 MED ORDER — LISINOPRIL 5 MG PO TABS
5.0000 mg | ORAL_TABLET | Freq: Every day | ORAL | 1 refills | Status: DC
  Filled 2022-11-11: qty 90, 90d supply, fill #0
  Filled 2023-02-19: qty 90, 90d supply, fill #1

## 2022-11-11 NOTE — Progress Notes (Signed)
Subjective:     Patient ID: Elizabeth [name], female    DOB: 11/29/1975, 47 y.o.   MRN: [id number]  Chief Complaint  Patient presents with   Follow-up    Follow up after surgery 09/30/2022    HPI Patient is in today for routine follow up.  HTN- She is off of amlodipine and lisinopril for 1 week. Was having low blood pressures at home.   BP Readings from Last 3 Encounters:  11/11/22 125/77  10/01/22 (!) 131/97  09/20/22 132/83   Hyperlipidemia-  Lab Results  Component Value Date   CHOL 194 02/27/2022   HDL 56 02/27/2022   LDLCALC 117 02/27/2022   TRIG 100 02/27/2022   CHOLHDL 3 12/31/2021   DM2- she is followed by Endocrinology. Reports that she saw Dr. Meredith Pel on 10/25/21. She checks glucose every morning.  Lowest sugar was 90.  Lab Results  Component Value Date   HGBA1C 10.7 (H) 09/16/2022   HGBA1C 9.5 02/27/2022   HGBA1C 7.8 (H) 09/05/2021   Lab Results  Component Value Date   MICROALBUR 4.8 (H) 09/20/2022   [place] 117 02/27/2022   CREATININE 0.78 09/30/2022   Morbid obesity- s/p Roux en Y gastric bypass on 09/30/22 with hiatal hernia repair. This was completed by Dr. Thermon Leyland.  She has lost 20 pounds since her surgery.   Wt Readings from Last 3 Encounters:  11/11/22 287 lb (130.2 kg)  10/15/22 292 lb 6.4 oz (132.6 kg)  09/30/22 (!) 307 lb (139.3 kg)   She reports 2 week of heavy menstrual period following surgery.  She recently restarted her norethindrone tablet.    Health Maintenance Due  Topic Date Due   COVID-19 Vaccine (4 - 2023-24 season) 06/21/2022    Past Medical History:  Diagnosis Date   Anemia    Anxiety    Arthritis of left knee    Astigmatism    Depression    Diabetes mellitus without complication (HCC)    Dry mouth    Fatigue    GERD (gastroesophageal reflux disease)    Heartburn    High cholesterol    Hypertension    Keratoconjunctivitis    Liver disease    Muscle pain    Vitamin D deficiency    Weakness     Past  Surgical History:  Procedure Laterality Date   BIOPSY  01/18/2021   Procedure: BIOPSY;  Surgeon: Lavena Bullion, DO;  Location: [facility];  Service: Gastroenterology;;   CHOLECYSTECTOMY N/A 05/11/2021   Procedure: LAPAROSCOPIC CHOLECYSTECTOMY WITH INTRAOPERATIVE CHOLANGIOGRAM;  Surgeon: Felicie Morn, MD;  Location: [facility];  Service: General;  Laterality: N/A;   DILATION AND CURETTAGE OF UTERUS     ENDOMETRIAL BIOPSY  12/19/2017   normal per pt   ESOPHAGOGASTRODUODENOSCOPY (EGD) WITH PROPOFOL N/A 01/18/2021   Procedure: ESOPHAGOGASTRODUODENOSCOPY (EGD) WITH PROPOFOL;  Surgeon: Lavena Bullion, DO;  Location: [facility];  Service: Gastroenterology;  Laterality: N/A;   HIATAL HERNIA REPAIR N/A 09/30/2022   Procedure: HERNIA REPAIR HIATAL;  Surgeon: Felicie Morn, MD;  Location: [facility];  Service: General;  Laterality: N/A;   LAPAROSCOPIC ROUX-EN-Y GASTRIC BYPASS WITH HIATAL HERNIA REPAIR  09/30/2022   TONSILLECTOMY     TUBAL LIGATION     UPPER GI ENDOSCOPY N/A 09/30/2022   Procedure: UPPER GI ENDOSCOPY;  Surgeon: Felicie Morn, MD;  Location: [facility];  Service: General;  Laterality: N/A;    Family History  Problem Relation Age of Onset   Diabetes Mother  Hypertension Mother    Hyperlipidemia Mother    Obesity Mother    Peripheral Artery Disease Mother    HIV Father        died from complications (IVDU)   Alcohol abuse Father    Drug abuse Father    Ovarian cancer Maternal Grandmother    Colon cancer Neg Hx    Esophageal cancer Neg Hx    Liver disease Neg Hx    Pancreatic cancer Neg Hx    Stomach cancer Neg Hx    Sleep apnea Neg Hx     Social History   Socioeconomic History   Marital status: Married    Spouse name: [name]   Number of children: 2   Years of education: Not on file   Highest education level: Not on file  Occupational History   Occupation: [place]    Employer: [employer]  Tobacco Use   Smoking status:  Former    Packs/day: 0.50    Years: 3.00    Total pack years: 1.50    Types: Cigarettes    Quit date: 08/21/2021    Years since quitting: 1.2   Smokeless tobacco: Never   Tobacco comments:    4-5/day  Vaping Use   Vaping Use: Never used  Substance and Sexual Activity   Alcohol use: No   Drug use: No   Sexual activity: Yes    Partners: Male    Birth control/protection: Surgical, Implant  Other Topics Concern   Not on file  Social History Narrative    2 children   1997- son Laquan   2000- son Insurance claims handler in [place] in [place]   Enjoys reading   Widowed 07/18/23, husband died from COVID-31.   Social Determinants of Health   Financial Resource Strain: Not on file  Food Insecurity: No Food Insecurity (09/30/2022)   Hunger Vital Sign    Worried About Running Out of Food in the Last Year: Never true    Ran Out of Food in the Last Year: Never true  Transportation Needs: No Transportation Needs (09/30/2022)   PRAPARE - Hydrologist (Medical): No    Lack of Transportation (Non-Medical): No  Physical Activity: Not on file  Stress: Not on file  Social Connections: Not on file  Intimate Partner Violence: Not At Risk (09/30/2022)   Humiliation, Afraid, Rape, and Kick questionnaire    Fear of Current or Ex-Partner: No    Emotionally Abused: No    Physically Abused: No    Sexually Abused: No    Outpatient Medications Prior to Visit  Medication Sig Dispense Refill   Blood Glucose Monitoring Suppl (FREESTYLE LITE) w/Device KIT Use to check blood sugar 4 times a day as directed 1 kit 0   Dulaglutide (TRULICITY) 1.5 ZO/1.0RU SOPN Inject 1.5 mg into the skin once a week. 2 mL 5   escitalopram (LEXAPRO) 10 MG tablet Take 1.5 tablets (15 mg total) by mouth daily. 45 tablet 1   etonogestrel (NEXPLANON) 68 MG IMPL implant 1 each by Subdermal route once.     glucose blood (FREESTYLE LITE) test strip Use to check blood sugar 4 times a day  as directed 100 each 5   hydrOXYzine (ATARAX) 25 MG tablet Take 2 tablets by mouth at bedtime as needed for insomnia. 60 tablet 3   Lancets (FREESTYLE) lancets Use to check blood sugar 4 times a day as directed 100 each 0   lovastatin (  MEVACOR) 40 MG tablet TAKE 1 TABLET BY MOUTH AT BEDTIME. 30 tablet 11   norethindrone (ORTHO MICRONOR) 0.35 MG tablet Take 1 tablet (0.35 mg total) by mouth daily. 28 tablet 11   ondansetron (ZOFRAN-ODT) 4 MG disintegrating tablet Take 1 tablet (4 mg total) by mouth every 6 (six) hours as needed for nausea or vomiting. 20 tablet 0   pantoprazole (PROTONIX) 40 MG tablet Take 1 tablet (40 mg total) by mouth daily. 90 tablet 0   TRULICITY 1.5 TF/5.7DU SOPN Inject 1.5 mg into the skin once a week. 6 mL 3   amLODipine (NORVASC) 10 MG tablet TAKE 1 TABLET BY MOUTH ONCE DAILY 90 tablet 1   lisinopril (ZESTRIL) 40 MG tablet TAKE 1 TABLET BY MOUTH ONCE DAILY 90 tablet 1   TRULICITY 1.5 KG/2.5KY SOPN Inject 1.5 mg under the skin once a week - STOP the 3.0 mg pens 2 mL 5   meloxicam (MOBIC) 7.5 MG tablet Take 1 tablet (7.5 mg total) by mouth daily as needed for pain. 30 tablet 1   methocarbamol (ROBAXIN) 500 MG tablet Take 1 tablet (500 mg total) by mouth 3 (three) times daily for up to 10 days. 30 tablet 0   XIGDUO XR 02-999 MG TB24 Take 2 tablets by mouth daily before breakfast. 180 tablet 0   No facility-administered medications prior to visit.    Allergies  Allergen Reactions   Tomato Hives, Itching and Rash    ROS    See HPI Objective:    Physical Exam Constitutional:      General: She is not in acute distress.    Appearance: Normal appearance. She is well-developed.  HENT:     Head: Normocephalic and atraumatic.     Right Ear: External ear normal.     Left Ear: External ear normal.  Eyes:     General: No scleral icterus. Neck:     Thyroid: No thyromegaly.  Cardiovascular:     Rate and Rhythm: Normal rate and regular rhythm.     Heart sounds: Normal  heart sounds. No murmur heard. Pulmonary:     Effort: Pulmonary effort is normal. No respiratory distress.     Breath sounds: Normal breath sounds. No wheezing.  Musculoskeletal:     Cervical back: Neck supple.  Skin:    General: Skin is warm and dry.  Neurological:     Mental Status: She is alert and oriented to person, place, and time.  Psychiatric:        Mood and Affect: Mood normal.        Behavior: Behavior normal.        Thought Content: Thought content normal.        Judgment: Judgment normal.     BP 125/77 (BP Location: Right Arm, Patient Position: Sitting, Cuff Size: Small)   Pulse 76   Temp 98.5 F (36.9 C) (Oral)   Resp 16   Wt 287 lb (130.2 kg)   SpO2 100%   BMI 52.49 kg/m  Wt Readings from Last 3 Encounters:  11/11/22 287 lb (130.2 kg)  10/15/22 292 lb 6.4 oz (132.6 kg)  09/30/22 (!) 307 lb (139.3 kg)       Assessment & Plan:   Problem List Items Addressed This Visit       Unprioritized   Uncontrolled type 2 diabetes mellitus with hyperglycemia ([place])    Sugars have been stable post operatively.  Expect next A1C to look much better.  She is maintained on Trulicity  and this is managed by her endocrinologist.       Relevant Medications   lisinopril (ZESTRIL) 5 MG tablet   Morbid obesity with BMI of 50.0-59.9, adult ([place])    She is doing well post-operatively and is down 20 pounds.  She notes increased gas. She has incorporated beans as protein into her diet as well as protein shakes which are pea protein based.  Suggested that she try using Beano with bean servings to help with gas and also switching to a whey based protein shake to see if this helps reduce her gas/bloating.       Hyperlipidemia    Last lipid panel was at goal. Continue mevacor.       Relevant Medications   lisinopril (ZESTRIL) 5 MG tablet   Excessive bleeding in premenopausal period - Primary    She is back on progesterone which is being managed by her GYN.  Hopefully this next  cycle will not be as heavy.       Relevant Orders   CBC w/Diff   Iron, TIBC and Ferritin Panel   Essential hypertension    BP Readings from Last 3 Encounters:  11/11/22 125/77  10/01/22 (!) 131/97  09/20/22 132/83  BP looks great despite her discontinuation of lisinopril and amlodipine last week.  I did recommend that she restart lisinopril '5mg'$  once daily for renal protection due to history of microalbuminuria.       Relevant Medications   lisinopril (ZESTRIL) 5 MG tablet   Other Relevant Orders   Comp Met (CMET)    I have discontinued Hebah Auzenne's meloxicam, lisinopril, amLODipine, Xigduo XR, and methocarbamol. I am also having her start on lisinopril. Additionally, I am having her maintain her Nexplanon, hydrOXYzine, FreeStyle Lite, freestyle, norethindrone, Trulicity, pantoprazole, FREESTYLE LITE, escitalopram, ondansetron, Trulicity, and lovastatin.  Meds ordered this encounter  Medications   lisinopril (ZESTRIL) 5 MG tablet    Sig: Take 1 tablet (5 mg total) by mouth daily.    Dispense:  90 tablet    Refill:  1    Order Specific Question:   Supervising Provider    Answer:   Penni Homans A [[id number]]

## 2022-11-11 NOTE — Assessment & Plan Note (Addendum)
Sugars have been stable post operatively.  Expect next A1C to look much better.  She is maintained on Trulicity and this is managed by her endocrinologist.

## 2022-11-11 NOTE — Assessment & Plan Note (Signed)
BP Readings from Last 3 Encounters:  11/11/22 125/77  10/01/22 (!) 131/97  09/20/22 132/83   BP looks great despite her discontinuation of lisinopril and amlodipine last week.  I did recommend that she restart lisinopril '5mg'$  once daily for renal protection due to history of microalbuminuria.

## 2022-11-11 NOTE — Assessment & Plan Note (Signed)
Last lipid panel was at goal. Continue mevacor.

## 2022-11-11 NOTE — Assessment & Plan Note (Signed)
She is doing well post-operatively and is down 20 pounds.  She notes increased gas. She has incorporated beans as protein into her diet as well as protein shakes which are pea protein based.  Suggested that she try using Beano with bean servings to help with gas and also switching to a whey based protein shake to see if this helps reduce her gas/bloating.

## 2022-11-11 NOTE — Assessment & Plan Note (Signed)
She is back on progesterone which is being managed by her GYN.  Hopefully this next cycle will not be as heavy.

## 2022-11-12 ENCOUNTER — Ambulatory Visit: Payer: Self-pay | Admitting: Surgery

## 2022-11-12 ENCOUNTER — Ambulatory Visit (INDEPENDENT_AMBULATORY_CARE_PROVIDER_SITE_OTHER): Payer: Commercial Managed Care - PPO

## 2022-11-12 ENCOUNTER — Other Ambulatory Visit (HOSPITAL_BASED_OUTPATIENT_CLINIC_OR_DEPARTMENT_OTHER): Payer: Self-pay

## 2022-11-12 ENCOUNTER — Encounter: Payer: Self-pay | Admitting: Family

## 2022-11-12 ENCOUNTER — Other Ambulatory Visit: Payer: Self-pay

## 2022-11-12 VITALS — BP 138/91 | HR 71 | Temp 98.4°F | Resp 18 | Ht 62.0 in | Wt 287.0 lb

## 2022-11-12 DIAGNOSIS — R1011 Right upper quadrant pain: Secondary | ICD-10-CM

## 2022-11-12 DIAGNOSIS — R160 Hepatomegaly, not elsewhere classified: Secondary | ICD-10-CM

## 2022-11-12 DIAGNOSIS — Z9884 Bariatric surgery status: Secondary | ICD-10-CM

## 2022-11-12 DIAGNOSIS — E860 Dehydration: Secondary | ICD-10-CM

## 2022-11-12 LAB — IRON,TIBC AND FERRITIN PANEL
%SAT: 24 % (calc) (ref 16–45)
Ferritin: 100 ng/mL (ref 16–232)
Iron: 96 ug/dL (ref 40–190)
TIBC: 402 mcg/dL (calc) (ref 250–450)

## 2022-11-12 MED ORDER — SODIUM CHLORIDE 0.9 % IV BOLUS: 1000.0000 mL | Freq: Once | INTRAVENOUS | Status: DC

## 2022-11-12 MED ORDER — THIAMINE HCL 100 MG/ML IJ SOLN: Freq: Once | INTRAVENOUS | Status: DC

## 2022-11-12 MED ORDER — SODIUM CHLORIDE 0.9 % IV BOLUS
1000.0000 mL | Freq: Once | INTRAVENOUS | Status: AC
  Administered 2022-11-12: 1000 mL via INTRAVENOUS

## 2022-11-12 MED ORDER — SUCRALFATE 1 G PO TABS
1.0000 g | ORAL_TABLET | Freq: Four times a day (QID) | ORAL | 11 refills | Status: DC
  Filled 2022-11-12: qty 120, 30d supply, fill #0
  Filled 2022-12-17: qty 120, 30d supply, fill #1

## 2022-11-12 MED ORDER — THIAMINE HCL 100 MG/ML IJ SOLN
Freq: Once | INTRAVENOUS | Status: AC
  Filled 2022-11-12: qty 1000

## 2022-11-12 NOTE — Progress Notes (Signed)
Diagnosis: Dehydration  Provider:  Marshell Garfinkel MD  Procedure: Infusion  IV Type: Peripheral, IV Location: R Forearm  Normal Saline, Dose: 1000 ml  Infusion Start Time: 1331  Infusion Stop Time: 7425   Banana Bag, Dose: 1000 ml  Infusion Start Time: 5258  Infusion Stop Time: 9483 Post Infusion IV Care: Peripheral IV Discontinued  Discharge: Condition: Good, Destination: Home . AVS provided to patient.   Performed by:  Adelina Mings, LPN

## 2022-11-12 NOTE — Progress Notes (Unsigned)
Postoperative fluid resuscitation orders for bariatric surgery patient.

## 2022-11-14 NOTE — Telephone Encounter (Signed)
Elizabeth [name] and Elizabeth [name]- can we please try to get this completed tomorrow  (Friday 1/26?).  I ordered stat.

## 2022-11-14 NOTE — Addendum Note (Signed)
Addended by: [name] on: 11/14/2022 06:58 PM   Modules accepted: Orders

## 2022-11-15 ENCOUNTER — Telehealth: Payer: Self-pay | Admitting: Family

## 2022-11-15 ENCOUNTER — Encounter (HOSPITAL_BASED_OUTPATIENT_CLINIC_OR_DEPARTMENT_OTHER): Payer: Self-pay

## 2022-11-15 ENCOUNTER — Ambulatory Visit (HOSPITAL_BASED_OUTPATIENT_CLINIC_OR_DEPARTMENT_OTHER)
Admission: RE | Admit: 2022-11-15 | Discharge: 2022-11-15 | Disposition: A | Discharge status: Home / Self Care | Payer: Commercial Managed Care - PPO | Source: Ambulatory Visit | Attending: Family | Admitting: Family

## 2022-11-15 DIAGNOSIS — R109 Unspecified abdominal pain: Secondary | ICD-10-CM | POA: Diagnosis not present

## 2022-11-15 DIAGNOSIS — R1011 Right upper quadrant pain: Secondary | ICD-10-CM

## 2022-11-15 MED ORDER — IOHEXOL 300 MG/ML  SOLN
100.0000 mL | Freq: Once | INTRAMUSCULAR | Status: AC | PRN
  Administered 2022-11-15: 100 mL via INTRAVENOUS

## 2022-11-15 NOTE — Telephone Encounter (Signed)
Elizabeth [name] from imaging calling to speak with CMA about CT order placed by Earlie Counts yesterday. Please call back at [phone]

## 2022-11-15 NOTE — Telephone Encounter (Signed)
Authorization for test was completed this morning and patient has been scheduled for test this afternoon at 4 pm. Will come in at 2 pm to start oral contrast

## 2022-11-15 NOTE — Telephone Encounter (Signed)
Spoke to radiology, they will be ready for patient this afternoon

## 2022-11-17 NOTE — Addendum Note (Signed)
Addended by: [name] on: 11/17/2022 12:12 PM   Modules accepted: Orders

## 2022-11-18 ENCOUNTER — Other Ambulatory Visit (INDEPENDENT_AMBULATORY_CARE_PROVIDER_SITE_OTHER): Payer: Commercial Managed Care - PPO

## 2022-11-18 ENCOUNTER — Other Ambulatory Visit: Payer: Self-pay | Admitting: Family

## 2022-11-18 ENCOUNTER — Other Ambulatory Visit (HOSPITAL_BASED_OUTPATIENT_CLINIC_OR_DEPARTMENT_OTHER): Payer: Self-pay

## 2022-11-18 DIAGNOSIS — Z0279 Encounter for issue of other medical certificate: Secondary | ICD-10-CM

## 2022-11-18 DIAGNOSIS — R160 Hepatomegaly, not elsewhere classified: Secondary | ICD-10-CM | POA: Diagnosis not present

## 2022-11-18 MED ORDER — ONDANSETRON 4 MG PO TBDP
4.0000 mg | ORAL_TABLET | Freq: Four times a day (QID) | ORAL | 1 refills | Status: DC | PRN
  Filled 2022-11-18: qty 30, 8d supply, fill #0
  Filled 2022-11-26: qty 30, 8d supply, fill #1

## 2022-11-18 NOTE — Addendum Note (Signed)
Addended by: Kelle Darting A on: 11/18/2022 04:13 PM   Modules accepted: Orders

## 2022-11-19 ENCOUNTER — Telehealth: Payer: Self-pay | Admitting: Family

## 2022-11-19 ENCOUNTER — Other Ambulatory Visit (HOSPITAL_BASED_OUTPATIENT_CLINIC_OR_DEPARTMENT_OTHER): Payer: Self-pay

## 2022-11-19 MED ORDER — OXYCODONE-ACETAMINOPHEN 5-325 MG PO TABS
1.0000 | ORAL_TABLET | Freq: Four times a day (QID) | ORAL | 0 refills | Status: DC | PRN
  Filled 2022-11-19: qty 20, 5d supply, fill #0

## 2022-11-19 MED ORDER — OMEPRAZOLE 40 MG PO CPDR
40.0000 mg | DELAYED_RELEASE_CAPSULE | Freq: Two times a day (BID) | ORAL | 0 refills | Status: DC
  Filled 2022-11-19: qty 180, 90d supply, fill #0

## 2022-11-19 NOTE — Telephone Encounter (Signed)
[employer] forms faxed into office  Placed in MO bin up front

## 2022-11-19 NOTE — Telephone Encounter (Signed)
Per pcp

## 2022-11-19 NOTE — Telephone Encounter (Signed)
Same forms completed and faxed yesterday. Will hold until we know they need.

## 2022-11-20 ENCOUNTER — Other Ambulatory Visit: Payer: Self-pay | Admitting: Surgery

## 2022-11-20 DIAGNOSIS — Z9884 Bariatric surgery status: Secondary | ICD-10-CM

## 2022-11-20 DIAGNOSIS — K219 Gastro-esophageal reflux disease without esophagitis: Secondary | ICD-10-CM

## 2022-11-20 LAB — HEPATITIS C RNA QUANTITATIVE
HCV Quantitative Log: <1.18 log IU/mL
HCV RNA, PCR, QN: <15 IU/mL

## 2022-11-21 ENCOUNTER — Encounter: Payer: Self-pay | Admitting: Family

## 2022-11-21 LAB — ANTI-MICROSOMAL ANTIBODY LIVER / KIDNEY: LKM1 Ab: <=20 U (ref <=20.0)

## 2022-11-21 LAB — HEPATITIS PANEL, ACUTE
Hep A IgM: NONREACTIVE
Hep B C IgM: NONREACTIVE
Hepatitis B Surface Ag: NONREACTIVE
Hepatitis C Ab: NONREACTIVE

## 2022-11-21 LAB — ANA: Anti Nuclear Antibody (ANA): POSITIVE — AB

## 2022-11-21 LAB — ANTI-NUCLEAR AB-TITER (ANA TITER)
ANA TITER: 1:80 {titer} — ABNORMAL HIGH
ANA Titer 1: 1:80 {titer} — ABNORMAL HIGH

## 2022-11-21 LAB — ANTI-SMOOTH MUSCLE ANTIBODY, IGG: Actin (Smooth Muscle) Antibody (IGG): <20 U (ref <20)

## 2022-11-22 ENCOUNTER — Ambulatory Visit: Payer: Self-pay | Admitting: Family

## 2022-11-22 DIAGNOSIS — Z0279 Encounter for issue of other medical certificate: Secondary | ICD-10-CM

## 2022-11-24 ENCOUNTER — Emergency Department (HOSPITAL_COMMUNITY): Payer: Commercial Managed Care - PPO

## 2022-11-24 ENCOUNTER — Other Ambulatory Visit: Payer: Self-pay

## 2022-11-24 ENCOUNTER — Emergency Department (HOSPITAL_COMMUNITY)
Admission: EM | Admit: 2022-11-24 | Discharge: 2022-11-24 | Disposition: A | Discharge status: Home / Self Care | Payer: Commercial Managed Care - PPO | Attending: Emergency Medicine | Admitting: Emergency Medicine

## 2022-11-24 ENCOUNTER — Encounter (HOSPITAL_COMMUNITY): Payer: Self-pay | Admitting: Emergency Medicine

## 2022-11-24 ENCOUNTER — Other Ambulatory Visit (HOSPITAL_BASED_OUTPATIENT_CLINIC_OR_DEPARTMENT_OTHER): Payer: Self-pay

## 2022-11-24 DIAGNOSIS — R911 Solitary pulmonary nodule: Secondary | ICD-10-CM

## 2022-11-24 DIAGNOSIS — R1013 Epigastric pain: Secondary | ICD-10-CM | POA: Diagnosis not present

## 2022-11-24 DIAGNOSIS — I10 Essential (primary) hypertension: Secondary | ICD-10-CM | POA: Diagnosis not present

## 2022-11-24 DIAGNOSIS — F1110 Opioid abuse, uncomplicated: Secondary | ICD-10-CM | POA: Insufficient documentation

## 2022-11-24 DIAGNOSIS — E119 Type 2 diabetes mellitus without complications: Secondary | ICD-10-CM | POA: Insufficient documentation

## 2022-11-24 LAB — CBC WITH DIFFERENTIAL/PLATELET
Abs Immature Granulocytes: 0.01 10*3/uL (ref 0.00–0.07)
Basophils Absolute: 0 10*3/uL (ref 0.0–0.1)
Basophils Relative: 1 %
Eosinophils Absolute: 0.1 10*3/uL (ref 0.0–0.5)
Eosinophils Relative: 2 %
HCT: 38.2 % (ref 36.0–46.0)
Hemoglobin: 11.8 g/dL — ABNORMAL LOW (ref 12.0–15.0)
Immature Granulocytes: 0 %
Lymphocytes Relative: 34 %
Lymphs Abs: 2.1 10*3/uL (ref 0.7–4.0)
MCH: 23.1 pg — ABNORMAL LOW (ref 26.0–34.0)
MCHC: 30.9 g/dL (ref 30.0–36.0)
MCV: 74.9 fL — ABNORMAL LOW (ref 80.0–100.0)
Monocytes Absolute: 0.4 10*3/uL (ref 0.1–1.0)
Monocytes Relative: 7 %
Neutro Abs: 3.6 10*3/uL (ref 1.7–7.7)
Neutrophils Relative %: 56 %
Platelets: 291 10*3/uL (ref 150–400)
RBC: 5.1 MIL/uL (ref 3.87–5.11)
RDW: 16.8 % — ABNORMAL HIGH (ref 11.5–15.5)
WBC: 6.2 10*3/uL (ref 4.0–10.5)
nRBC: 0 % (ref 0.0–0.2)

## 2022-11-24 LAB — COMPREHENSIVE METABOLIC PANEL
ALT: 94 U/L — ABNORMAL HIGH (ref 0–44)
AST: 96 U/L — ABNORMAL HIGH (ref 15–41)
Albumin: 3.3 g/dL — ABNORMAL LOW (ref 3.5–5.0)
Alkaline Phosphatase: 89 U/L (ref 38–126)
Anion gap: 10 (ref 5–15)
BUN: 9 mg/dL (ref 6–20)
CO2: 22 mmol/L (ref 22–32)
Calcium: 8.5 mg/dL — ABNORMAL LOW (ref 8.9–10.3)
Chloride: 103 mmol/L (ref 98–111)
Creatinine, Ser: 0.82 mg/dL (ref 0.44–1.00)
GFR, Estimated: >60 mL/min (ref >60)
Glucose, Bld: 211 mg/dL — ABNORMAL HIGH (ref 70–99)
Potassium: 3.9 mmol/L (ref 3.5–5.1)
Sodium: 135 mmol/L (ref 135–145)
Total Bilirubin: 0.1 mg/dL — ABNORMAL LOW (ref 0.3–1.2)
Total Protein: 6.3 g/dL — ABNORMAL LOW (ref 6.5–8.1)

## 2022-11-24 LAB — URINALYSIS, ROUTINE W REFLEX MICROSCOPIC
Bilirubin Urine: NEGATIVE
Glucose, UA: 50 mg/dL — AB
Hgb urine dipstick: NEGATIVE
Ketones, ur: NEGATIVE mg/dL
Leukocytes,Ua: NEGATIVE
Nitrite: NEGATIVE
Protein, ur: NEGATIVE mg/dL
Specific Gravity, Urine: 1.027 (ref 1.005–1.030)
pH: 5 (ref 5.0–8.0)

## 2022-11-24 LAB — LIPASE, BLOOD: Lipase: 54 U/L — ABNORMAL HIGH (ref 11–51)

## 2022-11-24 LAB — RAPID URINE DRUG SCREEN, HOSP PERFORMED
Amphetamines: NOT DETECTED
Barbiturates: NOT DETECTED
Benzodiazepines: NOT DETECTED
Cocaine: NOT DETECTED
Opiates: POSITIVE — AB
Tetrahydrocannabinol: NOT DETECTED

## 2022-11-24 LAB — PREGNANCY, URINE: Preg Test, Ur: NEGATIVE

## 2022-11-24 MED ORDER — IOHEXOL 350 MG/ML SOLN
100.0000 mL | Freq: Once | INTRAVENOUS | Status: AC | PRN
  Administered 2022-11-24: 100 mL via INTRAVENOUS

## 2022-11-24 MED ORDER — FENTANYL CITRATE PF 50 MCG/ML IJ SOSY
50.0000 ug | PREFILLED_SYRINGE | Freq: Once | INTRAMUSCULAR | Status: AC
  Administered 2022-11-24: 50 ug via INTRAVENOUS
  Filled 2022-11-24: qty 1

## 2022-11-24 MED ORDER — ALUM & MAG HYDROXIDE-SIMETH 200-200-20 MG/5ML PO SUSP
30.0000 mL | Freq: Once | ORAL | Status: AC
  Administered 2022-11-24: 30 mL via ORAL
  Filled 2022-11-24: qty 30

## 2022-11-24 MED ORDER — ONDANSETRON HCL 4 MG/2ML IJ SOLN
4.0000 mg | Freq: Once | INTRAMUSCULAR | Status: AC
  Administered 2022-11-24: 4 mg via INTRAVENOUS
  Filled 2022-11-24: qty 2

## 2022-11-24 MED ORDER — FAMOTIDINE 20 MG PO TABS: 20.0000 mg | ORAL_TABLET | Freq: Every day | ORAL | 0 refills | Status: DC

## 2022-11-24 MED ORDER — SODIUM CHLORIDE 0.9 % IV BOLUS
1000.0000 mL | Freq: Once | INTRAVENOUS | Status: AC
  Administered 2022-11-24: 1000 mL via INTRAVENOUS

## 2022-11-24 NOTE — Discharge Instructions (Addendum)
1) call Dr. Thermon Leyland tomorrow to discuss your symptoms. It seem that you may have some gastritis likely due to increased acid levels. We are starting you on a mild acid reducing medication but you should talk to your surgeon first thing Monday morning to make sure that this is appropriate for your recent surgery.  1 or 2 doses should not be harmful over the long-term.  Get help right away if: You vomit blood or a substance that looks like coffee grounds. You have black or dark red stools. You are unable to keep fluids down. These symptoms may represent a serious problem that is an emergency. Do not wait to see if the symptoms will go away. Get medical help right away. Call your local emergency services (911 in the [place] Do not drive yourself to the hospital.   2) there were multiple small pulmonary nodules found on your CT chest incidentally.  Please read the attached information regarding this diagnosis and finding.  Follow closely with your primary care provider [place] nodules often benign and if you have no history of smoking likely represent anything serious however the current recommendations are as follows:  [name] Per Fleischner Society  Guidelines, recommend a non-contrast Chest CT at 3-6 months, then  consider another non-contrast Chest CT at 18-24 months. If patient  is low risk for malignancy, non-contrast Chest CT at 18-24 months is  optional.

## 2022-11-24 NOTE — ED Provider Notes (Addendum)
[place] EMERGENCY DEPARTMENT AT [facility] Provider Note   CSN: [id number] Arrival date & time: 11/24/22  6568     History  Chief Complaint  Patient presents with   Abdominal Pain    Elizabeth [name] is a 47 y.o. female.  HPI   Medical history including hypertension, diabetes, GERD, cholecystectomy, hiatal hernia repair, status post gastric bypass past presents with complaints of upper quadrant tenderness.  Patient states this pain been going on for last 2 weeks, states the pain is constant, remains in her right upper quadrant, but will now feel rating to her back, she has no associated shortness of breath or pleuritic chest pain, she states that she has associated nausea without vomiting still passing gas having normal bowel movements, she denies any urinary symptoms, she states that pain seems to be worsening when going supine or after eating or drinking, states that she has been taking pain medication not much relief.  She has no history of alcohol use or NSAID use.  No history of connective tissue disorder, family history of dissection or AAA's.  Reviewed patient's chart had gastric bypass surgery per done on 12/23 Dr. Thermon Leyland patient was recently seen by him on the 30th, CT imaging was obtained as well as lab work all of which were unremarkable.  Started patient on PPI, Carafate and sent for barium study.  Home Medications Prior to Admission medications   Medication Sig Start Date End Date Taking? Authorizing Provider  CALCIUM-VITAMIN D PO Take 2 tablets by mouth every evening.   Yes [provider]  escitalopram (LEXAPRO) 10 MG tablet Take 1.5 tablets (15 mg total) by mouth daily. 10/20/22  Yes [name], NP  etonogestrel (NEXPLANON) 68 MG IMPL implant 1 each by Subdermal route once.   Yes [provider]  famotidine (PEPCID) 20 MG tablet Take 1 tablet (20 mg total) by mouth daily. 11/24/22  Yes Harris, Abigail, PA-C  hydrOXYzine  (ATARAX) 25 MG tablet Take 2 tablets by mouth at bedtime as needed for insomnia. 03/04/22 03/04/23 Yes [name], NP  lisinopril (ZESTRIL) 5 MG tablet Take 1 tablet (5 mg total) by mouth daily. 11/11/22  Yes [name], NP  lovastatin (MEVACOR) 40 MG tablet TAKE 1 TABLET BY MOUTH AT BEDTIME. 11/11/22 11/11/23 Yes [name], NP  Multiple Vitamin (MULTIVITAMIN WITH MINERALS) TABS tablet Take 2 tablets by mouth every evening.   Yes [provider]  norethindrone (ORTHO MICRONOR) 0.35 MG tablet Take 1 tablet (0.35 mg total) by mouth daily. 08/12/22  Yes Guss Bunde, MD  omeprazole (PRILOSEC) 40 MG capsule Take 1 capsule (40 mg total) by mouth in the morning and at bedtime. 11/19/22  Yes   ondansetron (ZOFRAN-ODT) 4 MG disintegrating tablet Take 1 tablet (4 mg total) by mouth every 6 (six) hours as needed for nausea or vomiting. 11/18/22  Yes [name], NP  oxyCODONE-acetaminophen (PERCOCET/ROXICET) 5-325 MG tablet Take 1 tablet by mouth every 6 (six) hours as needed for pain. 11/19/22  Yes   sucralfate (CARAFATE) 1 g tablet Take 1 tablet (1 g total) by mouth 4 (four) times daily before meals and nightly. [birth year]  Yes   TRULICITY 1.5 ZG/0.1VC SOPN Inject 1.5 mg into the skin once a week. 10/25/22  Yes   Blood Glucose Monitoring Suppl (FREESTYLE LITE) w/Device KIT Use to check blood sugar 4 times a day as directed 05/10/22   [name], NP  Dulaglutide (TRULICITY) 1.5 BS/4.9QP SOPN Inject 1.5 mg into the skin once  a week. Patient not taking: Reported on 11/24/2022 08/12/22     glucose blood (FREESTYLE LITE) test strip Use to check blood sugar 4 times a day as directed 10/20/22   [name], NP  Lancets (FREESTYLE) lancets Use to check blood sugar 4 times a day as directed 05/10/22     pantoprazole (PROTONIX) 40 MG tablet Take 1 tablet (40 mg total) by mouth daily. Patient not taking: Reported on 11/24/2022 10/01/22   Stechschulte, Nickola Major, MD   traZODone (DESYREL) 50 MG tablet 1/2 to 1 tab by mouth at bedtime as needed for sleep 09/04/20 11/20/20  [name], NP      Allergies    Tomato    Review of Systems   Review of Systems  Constitutional:  Negative for chills and fever.  Respiratory:  Negative for shortness of breath.   Cardiovascular:  Negative for chest pain.  Gastrointestinal:  Positive for abdominal pain and nausea. Negative for vomiting.  Neurological:  Negative for headaches.    Physical Exam Updated Vital Signs BP 122/76 (BP Location: Right Arm)   Pulse 78   Temp 98.1 F (36.7 C) (Oral)   Resp 16   Ht '5\' 2"'$  (1.575 m)   Wt 129.3 kg   LMP 10/26/2022 Comment: Negative pregnancy test  SpO2 100%   BMI 52.13 kg/m  Physical Exam Vitals and nursing note reviewed.  Constitutional:      General: She is not in acute distress.    Appearance: She is not ill-appearing.  HENT:     Head: Normocephalic and atraumatic.     Nose: No congestion.  Eyes:     Conjunctiva/sclera: Conjunctivae normal.  Cardiovascular:     Rate and Rhythm: Normal rate and regular rhythm.     Pulses: Normal pulses.     Heart sounds: No murmur heard.    No friction rub. No gallop.  Pulmonary:     Effort: No respiratory distress.     Breath sounds: No wheezing, rhonchi or rales.  Abdominal:     Palpations: Abdomen is soft.     Tenderness: There is abdominal tenderness. There is no right CVA tenderness or left CVA tenderness.     Comments: Nondistended, soft, notable tenderness in the right upper quadrant without guarding rebound tenderness or peritoneal sign no flank tenderness no CVA tenderness.  Skin:    General: Skin is warm and dry.  Neurological:     Mental Status: She is alert.  Psychiatric:        Mood and Affect: Mood normal.     ED Results / Procedures / Treatments   Labs (all labs ordered are listed, but only abnormal results are displayed) Labs Reviewed  COMPREHENSIVE METABOLIC PANEL - Abnormal; Notable for  the following components:      Result Value   Glucose, Bld 211 (*)    Calcium 8.5 (*)    Total Protein 6.3 (*)    Albumin 3.3 (*)    AST 96 (*)    ALT 94 (*)    Total Bilirubin 0.1 (*)    All other components within normal limits  CBC WITH DIFFERENTIAL/PLATELET - Abnormal; Notable for the following components:   Hemoglobin 11.8 (*)    MCV 74.9 (*)    MCH 23.1 (*)    RDW 16.8 (*)    All other components within normal limits  LIPASE, BLOOD - Abnormal; Notable for the following components:   Lipase 54 (*)    All other components within normal  limits  RAPID URINE DRUG SCREEN, HOSP PERFORMED - Abnormal; Notable for the following components:   Opiates POSITIVE (*)    All other components within normal limits  URINALYSIS, ROUTINE W REFLEX MICROSCOPIC - Abnormal; Notable for the following components:   Color, Urine AMBER (*)    APPearance HAZY (*)    Glucose, UA 50 (*)    All other components within normal limits  PREGNANCY, URINE    EKG None  Radiology CT Angio Chest/Abd/Pel for Dissection W and/or Wo Contrast  Result Date: 11/24/2022 CLINICAL DATA:  Acute aortic syndrome suspected. Worsening epigastric pain, back pain. EXAM: CT ANGIOGRAPHY CHEST, ABDOMEN AND PELVIS TECHNIQUE: Non-contrast CT of the chest was initially obtained. Multidetector CT imaging through the chest, abdomen and pelvis was performed using the standard protocol during bolus administration of intravenous contrast. Multiplanar reconstructed images and MIPs were obtained and reviewed to evaluate the vascular anatomy. RADIATION DOSE REDUCTION: This exam was performed according to the departmental dose-optimization program which includes automated exposure control, adjustment of the mA and/or kV according to patient size and/or use of iterative reconstruction technique. CONTRAST:  14m OMNIPAQUE IOHEXOL 350 MG/ML SOLN COMPARISON:  11/15/2022 FINDINGS: CTA CHEST FINDINGS Cardiovascular: Satisfactory opacification of the  pulmonary arteries to the segmental level. No evidence of pulmonary embolism. Normal heart size. No pericardial effusion. Mediastinum/Nodes: No enlarged mediastinal, hilar, or axillary lymph nodes. Thyroid gland, trachea, and esophagus demonstrate no significant findings. Lungs/Pleura: No pleural fluid. No airspace consolidation or pneumothorax. Bronchial wall thickening identified. Scattered peripheral areas of tree-in-bud nodularity identified bilaterally compatible with nonspecific inflammatory or infectious bronchiolitis., image 38/5. Several nonspecific lung nodules are noted, including: -Non solid nodule within the periphery of the left upper lobe measures 0.8 cm, image 58/5. -Subpleural nodule within the lateral left upper lobe measures 0.4 cm, image 38/5. -Additional small scattered micro nodules are noted bilaterally. Musculoskeletal: No chest wall abnormality. No acute or significant osseous findings. Review of the MIP images confirms the above findings. CTA ABDOMEN AND PELVIS FINDINGS VASCULAR Aorta: Normal caliber aorta without aneurysm, dissection, vasculitis or significant stenosis. Celiac: Common origin with the SMA. Patent without evidence of aneurysm, dissection, vasculitis or significant stenosis. SMA: Common origin with the celiac. Patent without evidence for aneurysm, dissection, vasculitis or significant stenosis. Renals: Both renal arteries are patent without evidence of aneurysm, dissection, vasculitis, fibromuscular dysplasia or significant stenosis. IMA: Patent without evidence of aneurysm, dissection, vasculitis or significant stenosis. Inflow: Mild atherosclerotic calcifications. Patent without evidence of aneurysm, dissection, vasculitis or significant stenosis. Veins: No obvious venous abnormality within the limitations of this arterial phase study. Review of the MIP images confirms the above findings. NON-VASCULAR Hepatobiliary: Mild hepatic steatosis. No focal liver abnormality.  Cholecystectomy. Pancreas: Unremarkable. No pancreatic ductal dilatation or surrounding inflammatory changes. Spleen: Normal in size without focal abnormality. Adrenals/Urinary Tract: Adrenal glands are unremarkable. Kidneys are normal, without renal calculi, focal lesion, or hydronephrosis. Bladder is unremarkable. Stomach/Bowel: Status post gastric bypass. The appendix is visualized and appears normal. No bowel wall thickening, inflammation, or distension. Lymphatic: No enlarged abdominopelvic lymph nodes. Reproductive: Uterus and bilateral adnexa are unremarkable. Right ovary cyst measures 2.1 cm, image 167/6. Previously 3.4 cm. Other: No free fluid or fluid collections. No signs of pneumoperitoneum Musculoskeletal: No acute or significant osseous findings. Review of the MIP images confirms the above findings. IMPRESSION: 1. No evidence for aortic dissection or aneurysm. 2. Bronchial wall thickening with scattered peripheral areas of tree-in-bud nodularity compatible with nonspecific inflammatory or infectious bronchiolitis. 3. Multiple pulmonary  nodules. Most significant: 8 mm left solid pulmonary nodule within the upper lobe. Per Fleischner Society Guidelines, recommend a non-contrast Chest CT at 3-6 months, then consider another non-contrast Chest CT at 18-24 months. If patient is low risk for malignancy, non-contrast Chest CT at 18-24 months is optional. 4. No acute findings within the abdomen or pelvis. 5. Hepatic steatosis. 6. 2.1 cm right ovary cyst.  No follow-up imaging recommended. Electronically Signed   By: Kerby Moors M.D.   On: 11/24/2022 06:26    Procedures Procedures    Medications Ordered in ED Medications  sodium chloride 0.9 % bolus 1,000 mL (0 mLs Intravenous Stopped 11/24/22 0701)  ondansetron (ZOFRAN) injection 4 mg (4 mg Intravenous Given 11/24/22 0435)  fentaNYL (SUBLIMAZE) injection 50 mcg (50 mcg Intravenous Given 11/24/22 0435)  alum & mag hydroxide-simeth (MAALOX/MYLANTA)  200-200-20 MG/5ML suspension 30 mL (30 mLs Oral Given 11/24/22 0435)  iohexol (OMNIPAQUE) 350 MG/ML injection 100 mL (100 mLs Intravenous Contrast Given 11/24/22 0549)    ED Course/ Medical Decision Making/ A&P                             Medical Decision Making Amount and/or Complexity of Data Reviewed Labs: ordered. Radiology: ordered.  Risk OTC drugs. Prescription drug management.   This patient presents to the ED for concern of abdominal pain, this involves an extensive number of treatment options, and is a complaint that carries with it a high risk of complications and morbidity.  The differential diagnosis includes PE, lower lobe pneumonia, dissection, AAA, bowel obstruction    Additional history obtained:  Additional history obtained from partner at bedside External records from outside source obtained and reviewed including general surgery notes   Co morbidities that complicate the patient evaluation  Recent bypass surgery  Social Determinants of Health:  N/A    Lab Tests:  I Ordered, and personally interpreted labs.  The pertinent results include: CBC shows normocytic anemia hemoglobin 11.8, CMP shows hyperglycemia of 211, slightly elevated liver enzymes, lipase slightly elevated at 54, UA is unremarkable urine pregnancy is negative   Imaging Studies ordered:  I ordered imaging studies including dissection study I independently visualized and interpreted imaging which showed no evidence of dissection or PE, pulmonary nodules present. I agree with the radiologist interpretation   Cardiac Monitoring:  The patient was maintained on a cardiac monitor.  I personally viewed and interpreted the cardiac monitored which showed an underlying rhythm of: N/A   Medicines ordered and prescription drug management:  I ordered medication including fluids, pain medication, GI cocktail I have reviewed the patients home medicines and have made adjustments as  needed  Critical Interventions:  N/A   Reevaluation:  Presents with abdominal pain, notably tender in the right upper quadrant, concerned with presentation of abdominal pain rating to the back, slightly elevated BP, will send down for dissection study for further evaluation.  Patient reassessed, resting comfortably, abdomen nontender, she is agreement discharge at this time.  Consultations Obtained:  N/A   Test Considered:  N/A    Rule out Suspicion for UTI [place] kidney stone is low as UA is negative for signs infection or hematuria.  I doubt pancreatitis his lipase is only mildly elevated at 54.  Suspicion for dissection, PE, AAA, lower lobe pneumonia, pyelo-, volvulus, bowel obstruction low at this time as CT imaging is all negative for these findings.  Suspicion for choledocholithiasis or cholangitis as imaging shows no  signs of ductal dilation, no leukocytosis.  Doubt mesenteric ischemia as imaging is also negative for these findings.     Dispostion and problem list  After consideration of the diagnostic results and the patients response to treatment, I feel that the patent would benefit from discharge.  Right upper quadrant pain-likely this is gastritis, patient is currently on PPIs, will have her continue with this regiment, and follow-up with her general surgeon for further evaluation Pulmonary nodules-patient was made aware of these findings and will follow-up with her PCP for reassessment.            Final Clinical Impression(s) / ED Diagnoses Final diagnoses:  Epigastric abdominal pain  Incidental pulmonary nodule    Rx / DC Orders ED Discharge Orders          Ordered    famotidine (PEPCID) 20 MG tablet  Daily        11/24/22 0711              Marcello Fennel, PA-C 11/24/22 0714    Marcello Fennel, PA-C 97/41/63 [id number]    Delora Fuel, MD 64/68/03 [phone]

## 2022-11-24 NOTE — ED Triage Notes (Addendum)
Presents form home for episgastric abd pain, back pain, and nausea x 2weeks. Has been seen for same by PCP and surgeon (gastric bypass in Dec) but unsure of cause of sx. She has had a CT scan (only remarkable for fatty liver per pt), scheduled for barium swallow in a week.  Pain has gotten worse tonight.  Last PO intake 7pm.  LMP 10/26/22 Took zofran and percocet at 9pm some relief

## 2022-11-24 NOTE — ED Notes (Signed)
PA at bedside.

## 2022-11-25 ENCOUNTER — Other Ambulatory Visit (HOSPITAL_BASED_OUTPATIENT_CLINIC_OR_DEPARTMENT_OTHER): Payer: Self-pay

## 2022-11-25 ENCOUNTER — Telehealth: Payer: Self-pay

## 2022-11-25 MED ORDER — OXYCODONE-ACETAMINOPHEN 5-325 MG PO TABS
1.0000 | ORAL_TABLET | Freq: Four times a day (QID) | ORAL | 0 refills | Status: DC | PRN
  Filled 2022-11-25: qty 20, 5d supply, fill #0

## 2022-11-25 NOTE — Telephone Encounter (Signed)
Forms printed out for provider

## 2022-11-25 NOTE — Telephone Encounter (Signed)
Transition Care Management Follow-up Telephone Call Date of discharge and from where: [facility], 11/24/2022 How have you been since you were released from the hospital? "About the same"  Patient states she has f/u with GI and surgery scheduled.   "Pulmonary nodules-patient was made aware of these findings and will follow-up with her PCP for reassessment."

## 2022-11-26 ENCOUNTER — Other Ambulatory Visit: Payer: Self-pay | Admitting: Family

## 2022-11-26 ENCOUNTER — Ambulatory Visit: Payer: 59 | Admitting: Dietician

## 2022-11-26 ENCOUNTER — Encounter: Payer: Self-pay | Admitting: Family

## 2022-11-26 DIAGNOSIS — R7989 Other specified abnormal findings of blood chemistry: Secondary | ICD-10-CM

## 2022-11-26 DIAGNOSIS — R768 Other specified abnormal immunological findings in serum: Secondary | ICD-10-CM | POA: Insufficient documentation

## 2022-11-26 DIAGNOSIS — R160 Hepatomegaly, not elsewhere classified: Secondary | ICD-10-CM

## 2022-11-27 ENCOUNTER — Other Ambulatory Visit (HOSPITAL_BASED_OUTPATIENT_CLINIC_OR_DEPARTMENT_OTHER): Payer: Self-pay

## 2022-11-27 ENCOUNTER — Ambulatory Visit: Payer: Commercial Managed Care - PPO | Admitting: Family

## 2022-11-27 NOTE — Anesthesia Preprocedure Evaluation (Signed)
Anesthesia Evaluation    Reviewed: Allergy & Precautions, Patient's Chart, lab work & pertinent test results  History of Anesthesia Complications Negative for: history of anesthetic complications  Airway Mallampati: II  TM Distance: >3 FB Neck ROM: Full    Dental no notable dental hx.    Pulmonary former smoker          Cardiovascular hypertension, Pt. on medications      Neuro/Psych   Anxiety Depression    negative neurological ROS     GI/Hepatic Neg liver ROS, hiatal hernia,GERD  Medicated,,  Endo/Other  diabetes, Type 2, Oral Hypoglycemic Agents  Morbid obesity (BMI 52.1)  Renal/GU negative Renal ROS  negative genitourinary   Musculoskeletal  (+) Arthritis ,    Abdominal  (+) + obese  Peds  Hematology negative hematology ROS (+)   Anesthesia Other Findings Day of surgery medications reviewed with patient.  Reproductive/Obstetrics negative OB ROS                             Anesthesia Physical Anesthesia Plan  ASA: 3  Anesthesia Plan: MAC   Post-op Pain Management: Minimal or no pain anticipated   Induction: Intravenous  PONV Risk Score and Plan: 2 and Treatment may vary due to age or medical condition and Propofol infusion  Airway Management Planned: Natural Airway and Nasal Cannula  Additional Equipment: None  Intra-op Plan:   Post-operative Plan:   Informed Consent:      Dental advisory given  Plan Discussed with: CRNA  Anesthesia Plan Comments: (Gastric Bypass issues  Check last Dulaglutide dose)       Anesthesia Quick Evaluation

## 2022-11-28 ENCOUNTER — Encounter (HOSPITAL_COMMUNITY)
Admission: RE | Disposition: A | Discharge status: Home / Self Care | Payer: Self-pay | Source: Home / Self Care | Attending: Surgery

## 2022-11-28 ENCOUNTER — Ambulatory Visit (HOSPITAL_COMMUNITY): Payer: Commercial Managed Care - PPO | Admitting: Anesthesiology

## 2022-11-28 ENCOUNTER — Other Ambulatory Visit: Payer: Self-pay

## 2022-11-28 ENCOUNTER — Ambulatory Visit (HOSPITAL_COMMUNITY)
Admission: RE | Admit: 2022-11-28 | Discharge: 2022-11-28 | Disposition: A | Discharge status: Home / Self Care | Payer: Commercial Managed Care - PPO | Attending: Surgery | Admitting: Surgery

## 2022-11-28 ENCOUNTER — Encounter (HOSPITAL_COMMUNITY): Payer: Self-pay | Admitting: Surgery

## 2022-11-28 ENCOUNTER — Ambulatory Visit: Payer: Self-pay | Admitting: Dietician

## 2022-11-28 ENCOUNTER — Other Ambulatory Visit (HOSPITAL_BASED_OUTPATIENT_CLINIC_OR_DEPARTMENT_OTHER): Payer: Self-pay

## 2022-11-28 DIAGNOSIS — Z538 Procedure and treatment not carried out for other reasons: Secondary | ICD-10-CM | POA: Diagnosis present

## 2022-11-28 SURGERY — CANCELLED PROCEDURE
Anesthesia: Monitor Anesthesia Care

## 2022-11-28 NOTE — H&P (Signed)
Case canceled.  I did not get to talk with patient.  Felicie Morn, MD General, Bariatric and Minimally Invasive Surgery [place]

## 2022-11-28 NOTE — Progress Notes (Addendum)
Surgery orders requested with Dr. Robby Sermon office.

## 2022-11-28 NOTE — Patient Instructions (Signed)
SURGICAL WAITING ROOM VISITATION Patients having surgery or a procedure may have no more than 2 support people in the waiting area - these visitors may rotate.    If the patient needs to stay at the hospital during part of their recovery, the visitor guidelines for inpatient rooms apply. Pre-op nurse will coordinate an appropriate time for 1 support person to accompany patient in pre-op.  This support person may not rotate.    Please refer to the [facility] website for the visitor guidelines for Inpatients (after your surgery is over and you are in a regular room).   Due to an increase in RSV and influenza rates and associated hospitalizations, children ages 70 and under may not visit patients in [place].     Your procedure is scheduled on: 12-02-22   Report to [facility] Main Entrance    Report to admitting at 7:00 AM   Call this number if you have problems the morning of surgery [phone]   Do not eat food :After Midnight.   After Midnight you may have the following liquids until 6:15 AM DAY OF SURGERY  Water Non-Citrus Juices (without pulp, NO RED) Carbonated Beverages Black Coffee (NO MILK/CREAM OR CREAMERS, sugar ok)  Clear Tea (NO MILK/CREAM OR CREAMERS, sugar ok) regular and decaf                             Plain Jell-O (NO RED)                                           Fruit ices (not with fruit pulp, NO RED)                                     Popsicles (NO RED)                                                               Sports drinks like Gatorade (NO RED)                   The day of surgery:  Drink ONE (1) Pre-Surgery G2 at 6:15 AM the morning of surgery. Drink in one sitting. Do not sip.  This drink was given to you during your hospital  pre-op appointment visit. Nothing else to drink after completing the Pre-Surgery G2.          If you have questions, please contact your surgeon's office.   FOLLOW BOWEL PREP AND ANY ADDITIONAL  PRE OP INSTRUCTIONS YOU RECEIVED FROM YOUR SURGEON'S OFFICE!!!     Oral Hygiene is also important to reduce your risk of infection.                                    Remember - BRUSH YOUR TEETH THE MORNING OF SURGERY WITH YOUR REGULAR TOOTHPASTE   Do NOT smoke after Midnight   Take these medicines the morning of surgery with A SIP OF WATER:  Escitalopram  Famotidine  Omeprazole  Ondansetron if needed  Oxycodone if needed  How to Manage Your Diabetes Before and After Surgery  Why is it important to control my blood sugar before and after surgery? Improving blood sugar levels before and after surgery helps healing and can limit problems. A way of improving blood sugar control is eating a healthy diet by:  Eating less sugar and carbohydrates  Increasing activity/exercise  Talking with your doctor about reaching your blood sugar goals High blood sugars (greater than 180 mg/dL) can raise your risk of infections and slow your recovery, so you will need to focus on controlling your diabetes during the weeks before surgery. Make sure that the doctor who takes care of your diabetes knows about your planned surgery including the date and location.  How do I manage my blood sugar before surgery? Check your blood sugar at least 4 times a day, starting 2 days before surgery, to make sure that the level is not too high or low. Check your blood sugar the morning of your surgery when you wake up and every 2 hours until you get to the Short Stay unit. If your blood sugar is less than 70 mg/dL, you will need to treat for low blood sugar: Do not take insulin. Treat a low blood sugar (less than 70 mg/dL) with  cup of clear juice (cranberry or apple), 4 glucose tablets, OR glucose gel. Recheck blood sugar in 15 minutes after treatment (to make sure it is greater than 70 mg/dL). If your blood sugar is not greater than 70 mg/dL on recheck, call [phone] for further instructions. Report your blood  sugar to the short stay nurse when you get to Short Stay.  If you are admitted to the hospital after surgery: Your blood sugar will be checked by the staff and you will probably be given insulin after surgery (instead of oral diabetes medicines) to make sure you have good blood sugar levels. The goal for blood sugar control after surgery is 80-180 mg/dL.   WHAT DO I DO ABOUT MY DIABETES MEDICATION?  Do not take oral diabetes medicines (pills) the morning of surgery.  Hold Trulicity 7 days before surgery (do not take after 11-24-22)  DO NOT TAKE THE FOLLOWING 7 DAYS PRIOR TO SURGERY: Ozempic, Wegovy, Rybelsus (Semaglutide), Byetta (exenatide), Bydureon (exenatide ER), Victoza, Saxenda (liraglutide), or Trulicity (dulaglutide) Mounjaro (Tirzepatide) Adlyxin (Lixisenatide), Polyethylene Glycol Loxenatide.  Reviewed and Endorsed by [facility] Patient Education Committee, August 2015  Bring CPAP mask and tubing day of surgery.                              You may not have any metal on your body including hair pins, jewelry, and body piercing             Do not wear make-up, lotions, powders, perfumes or deodorant  Do not wear nail polish including gel and S&S, artificial/acrylic nails, or any other type of covering on natural nails including finger and toenails. If you have artificial nails, gel coating, etc. that needs to be removed by a nail salon please have this removed prior to surgery or surgery may need to be canceled/ delayed if the surgeon/ anesthesia feels like they are unable to be safely monitored.   Do not shave  48 hours prior to surgery.    Do not bring valuables to the hospital. [place]  FOR VALUABLES.   Contacts, dentures or bridgework may not be worn into surgery.   Bring small overnight bag day of surgery.   DO NOT [place]. PHARMACY WILL DISPENSE MEDICATIONS LISTED ON YOUR MEDICATION LIST TO YOU DURING YOUR  ADMISSION [place]!     Special Instructions: Bring a copy of your healthcare power of attorney and living will documents the day of surgery if you haven't scanned them before.              Please read over the following fact sheets you were given: IF [place] [phone]  If you received a COVID test during your pre-op visit  it is requested that you wear a mask when out in public, stay away from anyone that may not be feeling well and notify your surgeon if you develop symptoms. If you test positive for Covid or have been in contact with anyone that has tested positive in the last 10 days please notify you surgeon.  [place] - Preparing for Surgery Before surgery, you can play an important role.  Because skin is not sterile, your skin needs to be as free of germs as possible.  You can reduce the number of germs on your skin by washing with CHG (chlorahexidine gluconate) soap before surgery.  CHG is an antiseptic cleaner which kills germs and bonds with the skin to continue killing germs even after washing. Please DO NOT use if you have an allergy to CHG or antibacterial soaps.  If your skin becomes reddened/irritated stop using the CHG and inform your nurse when you arrive at Short Stay. Do not shave (including legs and underarms) for at least 48 hours prior to the first CHG shower.  You may shave your face/neck.  Please follow these instructions carefully:  1.  Shower with CHG Soap the night before surgery and the  morning of surgery.  2.  If you choose to wash your hair, wash your hair first as usual with your normal  shampoo.  3.  After you shampoo, rinse your hair and body thoroughly to remove the shampoo.                             4.  Use CHG as you would any other liquid soap.  You can apply chg directly to the skin and wash.  Gently with a scrungie or clean washcloth.  5.  Apply the CHG Soap to your body ONLY FROM THE NECK  DOWN.   Do   not use on face/ open                           Wound or open sores. Avoid contact with eyes, ears mouth and   genitals (private parts).                       Wash face,  Genitals (private parts) with your normal soap.             6.  Wash thoroughly, paying special attention to the area where your    surgery  will be performed.  7.  Thoroughly rinse your body with warm water from the neck down.  8.  DO NOT shower/wash with your normal soap after using and rinsing off the CHG Soap.  9.  Pat yourself dry with a clean towel.            10.  Wear clean pajamas.            11.  Place clean sheets on your bed the night of your first shower and do not  sleep with pets. Day of Surgery : Do not apply any lotions/deodorants the morning of surgery.  Please wear clean clothes to the hospital/surgery center.  FAILURE TO FOLLOW THESE INSTRUCTIONS MAY RESULT IN THE CANCELLATION OF YOUR SURGERY  PATIENT SIGNATURE_________________________________  NURSE SIGNATURE__________________________________  ________________________________________________________________________

## 2022-11-28 NOTE — Progress Notes (Signed)
Pt was scheduled for EGD today. Patient took weekly dose of Trulicity on Sunday 2/4. Dr. Valma Cava with anesthesia over to talk to patient regarding not being off Trulicity for a week. Dr. Valma Cava began talking to patient about the possibility of canceling procedure due to taking truliciy. The patient did not let Dr. Valma Cava finish talking to her and stated I'm in pain this is a bunch of BS. Patient began getting dressed and left preop area saying she was getting out of here.

## 2022-11-29 ENCOUNTER — Other Ambulatory Visit: Payer: Self-pay

## 2022-11-29 ENCOUNTER — Ambulatory Visit: Payer: Self-pay | Admitting: Dietician

## 2022-11-29 ENCOUNTER — Ambulatory Visit: Payer: Self-pay | Admitting: Surgery

## 2022-11-29 ENCOUNTER — Encounter (HOSPITAL_COMMUNITY)
Admission: RE | Admit: 2022-11-29 | Discharge: 2022-11-29 | Disposition: A | Discharge status: Home / Self Care | Payer: Commercial Managed Care - PPO | Source: Ambulatory Visit | Attending: Surgery | Admitting: Surgery

## 2022-11-29 ENCOUNTER — Encounter (HOSPITAL_COMMUNITY): Payer: Self-pay

## 2022-11-29 ENCOUNTER — Telehealth: Payer: Self-pay | Admitting: Family

## 2022-11-29 HISTORY — DX: Other nonspecific abnormal finding of lung field: R91.8

## 2022-11-29 HISTORY — DX: Fatty (change of) liver, not elsewhere classified: K76.0

## 2022-11-29 HISTORY — DX: Unspecified ovarian cyst, unspecified side: N83.209

## 2022-11-29 NOTE — Telephone Encounter (Signed)
See mychart.  

## 2022-11-29 NOTE — Progress Notes (Addendum)
Anesthesia Review:  PCP: Mervyn Skeeters  LOV 11/11/22  Cardiologist : Agustin Cree LOV 2020  Chest x-ray :  2v- 02/10/22  CT angio chest- 11/24/22  EKG : 09/16/22  Echo : Stress test: 2020 - epic  Cardiac Cath :  Activity level:  Sleep Study/ CPAP : Fasting Blood Sugar :      / Checks Blood Sugar -- times a day:   Blood Thinner/ Instructions /Last Dose: ASA / Instructions/ Last Dose :    DM- type 2 - checks glucose every am   Trullicity - last dose on 11/24/22 per pt  Hgba1c- 12/02/22 -  Last hgba1c- was 09/16/22- in epic    Medical hx and instructions competed via phone call. PT voiced understandin of preop instructions.   PT to obtain bar of DIal soap ( antibacterial ) and perform preop showers as instructed.   Per Lyndon Code, PAc on 11/29/22-  CMP and CBC do not need to be repeated on 12/02/22.

## 2022-11-29 NOTE — Patient Instructions (Signed)
SURGICAL WAITING ROOM VISITATION  Patients having surgery or a procedure may have no more than 2 support people in the waiting area - these visitors may rotate.    Children under the age of 47 must have an adult with them who is not the patient.  Due to an increase in RSV and influenza rates and associated hospitalizations, children ages 40 and under may not visit patients in [place].  If the patient needs to stay at the hospital during part of their recovery, the visitor guidelines for inpatient rooms apply. Pre-op nurse will coordinate an appropriate time for 1 support person to accompany patient in pre-op.  This support person may not rotate.    Please refer to the [facility] website for the visitor guidelines for Inpatients (after your surgery is over and you are in a regular room).       Your procedure is scheduled on:  12/02/2022    Report to [facility] Main Entrance    Report to admitting at  0700 AM   Call this number if you have problems the morning of surgery [phone]   Do not eat food :After Midnight.   After Midnight you may have the following liquids until __ 0615____ AM  DAY OF SURGERY  Water Non-Citrus Juices (without pulp, NO RED-Apple, White grape, White cranberry) Black Coffee (NO MILK/CREAM OR CREAMERS, sugar ok)  Clear Tea (NO MILK/CREAM OR CREAMERS, sugar ok) regular and decaf                             Plain Jell-O (NO RED)                                           Fruit ices (not with fruit pulp, NO RED)                                     Popsicles (NO RED)                                                               Sports drinks like Gatorade (NO RED)                            If you have questions, please contact your surgeon's office.       Oral Hygiene is also important to reduce your risk of infection.                                    Remember - BRUSH YOUR TEETH THE MORNING OF SURGERY WITH YOUR REGULAR  TOOTHPASTE  DENTURES WILL BE REMOVED PRIOR TO SURGERY PLEASE DO NOT APPLY "Poly grip" OR ADHESIVES!!!   Do NOT smoke after Midnight   Take these medicines the morning of surgery with A SIP OF WATER:  escitalopram , famotidine, omeprazole, ondanestron ,   DO NOT TAKE ANY ORAL DIABETIC MEDICATIONS DAY OF YOUR SURGERY  Bring CPAP mask and tubing day of surgery.                              You may not have any metal on your body including hair pins, jewelry, and body piercing             Do not wear make-up, lotions, powders, perfumes/cologne, or deodorant  Do not wear nail polish including gel and S&S, artificial/acrylic nails, or any other type of covering on natural nails including finger and toenails. If you have artificial nails, gel coating, etc. that needs to be removed by a nail salon please have this removed prior to surgery or surgery may need to be canceled/ delayed if the surgeon/ anesthesia feels like they are unable to be safely monitored.   Do not shave  48 hours prior to surgery.               Men may shave face and neck.   Do not bring valuables to the hospital. [place].   Contacts, glasses, dentures or bridgework may not be worn into surgery.   Bring small overnight bag day of surgery.   DO NOT [place]. PHARMACY WILL DISPENSE MEDICATIONS LISTED ON YOUR MEDICATION LIST TO YOU DURING YOUR ADMISSION [place]!    Patients discharged on the day of surgery will not be allowed to drive home.  Someone NEEDS to stay with you for the first 24 hours after anesthesia.   Special Instructions: Bring a copy of your healthcare power of attorney and living will documents the day of surgery if you haven't scanned them before.              Please read over the following fact sheets you were given: IF [place] [phone]   If you  received a COVID test during your pre-op visit  it is requested that you wear a mask when out in public, stay away from anyone that may not be feeling well and notify your surgeon if you develop symptoms. If you test positive for Covid or have been in contact with anyone that has tested positive in the last 10 days please notify you surgeon.    [place] - Preparing for Surgery Before surgery, you can play an important role.  Because skin is not sterile, your skin needs to be as free of germs as possible.  You can reduce the number of germs on your skin by washing with CHG (chlorahexidine gluconate) soap before surgery.  CHG is an antiseptic cleaner which kills germs and bonds with the skin to continue killing germs even after washing. Please DO NOT use if you have an allergy to CHG or antibacterial soaps.  If your skin becomes reddened/irritated stop using the CHG and inform your nurse when you arrive at Short Stay. Do not shave (including legs and underarms) for at least 48 hours prior to the first CHG shower.  You may shave your face/neck. Please follow these instructions carefully:  1.  Shower with CHG Soap the night before surgery and the  morning of Surgery.  2.  If you choose to wash your hair, wash your hair first as usual with your  normal  shampoo.  3.  After you shampoo,  rinse your hair and body thoroughly to remove the  shampoo.                           4.  Use CHG as you would any other liquid soap.  You can apply chg directly  to the skin and wash                       Gently with a scrungie or clean washcloth.  5.  Apply the CHG Soap to your body ONLY FROM THE NECK DOWN.   Do not use on face/ open                           Wound or open sores. Avoid contact with eyes, ears mouth and genitals (private parts).                       Wash face,  Genitals (private parts) with your normal soap.             6.  Wash thoroughly, paying special attention to the area where your surgery  will be  performed.  7.  Thoroughly rinse your body with warm water from the neck down.  8.  DO NOT shower/wash with your normal soap after using and rinsing off  the CHG Soap.                9.  Pat yourself dry with a clean towel.            10.  Wear clean pajamas.            11.  Place clean sheets on your bed the night of your first shower and do not  sleep with pets. Day of Surgery : Do not apply any lotions/deodorants the morning of surgery.  Please wear clean clothes to the hospital/surgery center.  FAILURE TO FOLLOW THESE INSTRUCTIONS MAY RESULT IN THE CANCELLATION OF YOUR SURGERY PATIENT SIGNATURE_________________________________  NURSE SIGNATURE__________________________________  ________________________________________________________________________

## 2022-12-02 ENCOUNTER — Ambulatory Visit (HOSPITAL_COMMUNITY): Payer: Commercial Managed Care - PPO | Admitting: Certified Registered Nurse Anesthetist

## 2022-12-02 ENCOUNTER — Encounter (HOSPITAL_COMMUNITY): Payer: Self-pay | Admitting: Surgery

## 2022-12-02 ENCOUNTER — Other Ambulatory Visit: Payer: Self-pay

## 2022-12-02 ENCOUNTER — Encounter (HOSPITAL_COMMUNITY)
Admission: RE | Disposition: A | Discharge status: Home Health | Payer: Self-pay | Source: Home / Self Care | Attending: Surgery

## 2022-12-02 ENCOUNTER — Ambulatory Visit (HOSPITAL_COMMUNITY): Payer: Commercial Managed Care - PPO | Admitting: Physician Assistant

## 2022-12-02 ENCOUNTER — Inpatient Hospital Stay (HOSPITAL_COMMUNITY)
Admission: RE | Admit: 2022-12-02 | Discharge: 2022-12-06 | DRG: 394 | Disposition: A | Discharge status: Home Health | Payer: Commercial Managed Care - PPO | Attending: Surgery | Admitting: Surgery

## 2022-12-02 DIAGNOSIS — I10 Essential (primary) hypertension: Secondary | ICD-10-CM

## 2022-12-02 DIAGNOSIS — E7800 Pure hypercholesterolemia, unspecified: Secondary | ICD-10-CM | POA: Diagnosis present

## 2022-12-02 DIAGNOSIS — Z83438 Family history of other disorder of lipoprotein metabolism and other lipidemia: Secondary | ICD-10-CM

## 2022-12-02 DIAGNOSIS — Z833 Family history of diabetes mellitus: Secondary | ICD-10-CM

## 2022-12-02 DIAGNOSIS — K9509 Other complications of gastric band procedure: Secondary | ICD-10-CM | POA: Diagnosis not present

## 2022-12-02 DIAGNOSIS — Z79899 Other long term (current) drug therapy: Secondary | ICD-10-CM

## 2022-12-02 DIAGNOSIS — K660 Peritoneal adhesions (postprocedural) (postinfection): Secondary | ICD-10-CM | POA: Diagnosis present

## 2022-12-02 DIAGNOSIS — F418 Other specified anxiety disorders: Secondary | ICD-10-CM | POA: Diagnosis not present

## 2022-12-02 DIAGNOSIS — Z811 Family history of alcohol abuse and dependence: Secondary | ICD-10-CM

## 2022-12-02 DIAGNOSIS — Y832 Surgical operation with anastomosis, bypass or graft as the cause of abnormal reaction of the patient, or of later complication, without mention of misadventure at the time of the procedure: Secondary | ICD-10-CM | POA: Diagnosis present

## 2022-12-02 DIAGNOSIS — F32A Depression, unspecified: Secondary | ICD-10-CM | POA: Diagnosis present

## 2022-12-02 DIAGNOSIS — E119 Type 2 diabetes mellitus without complications: Principal | ICD-10-CM

## 2022-12-02 DIAGNOSIS — Z87891 Personal history of nicotine dependence: Secondary | ICD-10-CM

## 2022-12-02 DIAGNOSIS — F419 Anxiety disorder, unspecified: Secondary | ICD-10-CM | POA: Diagnosis present

## 2022-12-02 DIAGNOSIS — Z813 Family history of other psychoactive substance abuse and dependence: Secondary | ICD-10-CM

## 2022-12-02 DIAGNOSIS — R110 Nausea: Secondary | ICD-10-CM | POA: Diagnosis present

## 2022-12-02 DIAGNOSIS — Z8249 Family history of ischemic heart disease and other diseases of the circulatory system: Secondary | ICD-10-CM

## 2022-12-02 DIAGNOSIS — K9589 Other complications of other bariatric procedure: Principal | ICD-10-CM | POA: Diagnosis present

## 2022-12-02 DIAGNOSIS — Z8041 Family history of malignant neoplasm of ovary: Secondary | ICD-10-CM

## 2022-12-02 DIAGNOSIS — Z9884 Bariatric surgery status: Secondary | ICD-10-CM

## 2022-12-02 DIAGNOSIS — Z6841 Body Mass Index (BMI) 40.0 and over, adult: Secondary | ICD-10-CM

## 2022-12-02 DIAGNOSIS — Z9049 Acquired absence of other specified parts of digestive tract: Secondary | ICD-10-CM

## 2022-12-02 DIAGNOSIS — R1011 Right upper quadrant pain: Secondary | ICD-10-CM | POA: Diagnosis present

## 2022-12-02 DIAGNOSIS — K219 Gastro-esophageal reflux disease without esophagitis: Secondary | ICD-10-CM | POA: Diagnosis present

## 2022-12-02 DIAGNOSIS — K760 Fatty (change of) liver, not elsewhere classified: Secondary | ICD-10-CM | POA: Diagnosis present

## 2022-12-02 HISTORY — PX: UPPER GI ENDOSCOPY: SHX6162

## 2022-12-02 HISTORY — PX: LAPAROSCOPY: SHX197

## 2022-12-02 HISTORY — PX: LAPAROSCOPIC INSERTION GASTROSTOMY TUBE: SHX6817

## 2022-12-02 LAB — GLUCOSE, CAPILLARY
Glucose-Capillary: 184 mg/dL — ABNORMAL HIGH (ref 70–99)
Glucose-Capillary: 252 mg/dL — ABNORMAL HIGH (ref 70–99)
Glucose-Capillary: 229 mg/dL — ABNORMAL HIGH (ref 70–99)
Glucose-Capillary: 160 mg/dL — ABNORMAL HIGH (ref 70–99)

## 2022-12-02 LAB — CBC
HCT: 41.5 % (ref 36.0–46.0)
Hemoglobin: 12.4 g/dL (ref 12.0–15.0)
MCH: 23.4 pg — ABNORMAL LOW (ref 26.0–34.0)
MCHC: 29.9 g/dL — ABNORMAL LOW (ref 30.0–36.0)
MCV: 78.2 fL — ABNORMAL LOW (ref 80.0–100.0)
Platelets: 239 K/uL (ref 150–400)
RBC: 5.31 MIL/uL — ABNORMAL HIGH (ref 3.87–5.11)
RDW: 17.3 % — ABNORMAL HIGH (ref 11.5–15.5)
WBC: 10.3 K/uL (ref 4.0–10.5)
nRBC: 0 % (ref 0.0–0.2)

## 2022-12-02 LAB — CREATININE, SERUM
Creatinine, Ser: 0.79 mg/dL (ref 0.44–1.00)
GFR, Estimated: >60 mL/min (ref >60)

## 2022-12-02 LAB — HEMOGLOBIN A1C
Hgb A1c MFr Bld: 7.4 % — ABNORMAL HIGH (ref 4.8–5.6)
Mean Plasma Glucose: 165.68 mg/dL

## 2022-12-02 SURGERY — ENDOSCOPY, UPPER GI TRACT
Anesthesia: General

## 2022-12-02 MED ORDER — CHLORHEXIDINE GLUCONATE CLOTH 2 % EX PADS: 6.0000 | MEDICATED_PAD | Freq: Once | CUTANEOUS | Status: DC

## 2022-12-02 MED ORDER — BUPIVACAINE HCL (PF) 0.25 % IJ SOLN
INTRAMUSCULAR | Status: DC | PRN
  Administered 2022-12-02: 30 mL

## 2022-12-02 MED ORDER — DEXAMETHASONE SODIUM PHOSPHATE 10 MG/ML IJ SOLN
INTRAMUSCULAR | Status: AC
  Filled 2022-12-02: qty 1

## 2022-12-02 MED ORDER — OXYCODONE HCL 5 MG PO TABS: 5.0000 mg | ORAL_TABLET | Freq: Once | ORAL | Status: DC | PRN

## 2022-12-02 MED ORDER — ROCURONIUM BROMIDE 10 MG/ML (PF) SYRINGE
PREFILLED_SYRINGE | INTRAVENOUS | Status: DC | PRN
  Administered 2022-12-02 (×3): 10 mg via INTRAVENOUS
  Administered 2022-12-02: 40 mg via INTRAVENOUS

## 2022-12-02 MED ORDER — METHOCARBAMOL 1000 MG/10ML IJ SOLN
500.0000 mg | Freq: Four times a day (QID) | INTRAVENOUS | Status: DC | PRN
  Administered 2022-12-04 – 2022-12-06 (×3): 500 mg via INTRAVENOUS
  Filled 2022-12-02: qty 5
  Filled 2022-12-02: qty 500
  Filled 2022-12-02: qty 5
  Filled 2022-12-02: qty 500

## 2022-12-02 MED ORDER — SUCCINYLCHOLINE CHLORIDE 200 MG/10ML IV SOSY
PREFILLED_SYRINGE | INTRAVENOUS | Status: DC | PRN
  Administered 2022-12-02: 120 mg via INTRAVENOUS

## 2022-12-02 MED ORDER — ACETAMINOPHEN 160 MG/5ML PO SOLN
1000.0000 mg | Freq: Three times a day (TID) | ORAL | Status: DC
  Filled 2022-12-02: qty 40.6

## 2022-12-02 MED ORDER — ENSURE MAX PROTEIN PO LIQD
2.0000 [oz_av] | ORAL | Status: DC
  Administered 2022-12-03 – 2022-12-06 (×19): 2 [oz_av] via ORAL

## 2022-12-02 MED ORDER — LACTATED RINGERS IR SOLN
Status: DC | PRN
  Administered 2022-12-02: 1000 mL

## 2022-12-02 MED ORDER — OXYCODONE HCL 5 MG/5ML PO SOLN: 5.0000 mg | Freq: Once | ORAL | Status: DC | PRN

## 2022-12-02 MED ORDER — GABAPENTIN 300 MG PO CAPS
300.0000 mg | ORAL_CAPSULE | ORAL | Status: AC
  Administered 2022-12-02: 300 mg via ORAL
  Filled 2022-12-02: qty 1

## 2022-12-02 MED ORDER — FENTANYL CITRATE PF 50 MCG/ML IJ SOSY
25.0000 ug | PREFILLED_SYRINGE | INTRAMUSCULAR | Status: DC | PRN
  Administered 2022-12-02: 50 ug via INTRAVENOUS

## 2022-12-02 MED ORDER — ONDANSETRON HCL 4 MG/2ML IJ SOLN
INTRAMUSCULAR | Status: DC | PRN
  Administered 2022-12-02: 4 mg via INTRAVENOUS

## 2022-12-02 MED ORDER — SUCRALFATE 1 G PO TABS
1.0000 g | ORAL_TABLET | Freq: Three times a day (TID) | ORAL | Status: DC
  Administered 2022-12-02 – 2022-12-06 (×16): 1 g via ORAL
  Filled 2022-12-02 (×16): qty 1

## 2022-12-02 MED ORDER — PROPOFOL 10 MG/ML IV BOLUS
INTRAVENOUS | Status: AC
  Filled 2022-12-02: qty 20

## 2022-12-02 MED ORDER — INSULIN ASPART 100 UNIT/ML IJ SOLN: 0.0000 [IU] | Freq: Every day | INTRAMUSCULAR | Status: DC

## 2022-12-02 MED ORDER — FENTANYL CITRATE PF 50 MCG/ML IJ SOSY
PREFILLED_SYRINGE | INTRAMUSCULAR | Status: AC
  Administered 2022-12-02: 50 ug via INTRAVENOUS
  Filled 2022-12-02: qty 1

## 2022-12-02 MED ORDER — ONDANSETRON HCL 4 MG/2ML IJ SOLN
4.0000 mg | Freq: Four times a day (QID) | INTRAMUSCULAR | Status: DC | PRN
  Administered 2022-12-02 – 2022-12-04 (×4): 4 mg via INTRAVENOUS
  Filled 2022-12-02 (×3): qty 2

## 2022-12-02 MED ORDER — MIDAZOLAM HCL 2 MG/2ML IJ SOLN
INTRAMUSCULAR | Status: AC
  Filled 2022-12-02: qty 2

## 2022-12-02 MED ORDER — SUGAMMADEX SODIUM 500 MG/5ML IV SOLN
INTRAVENOUS | Status: DC | PRN
  Administered 2022-12-02: 500 mg via INTRAVENOUS

## 2022-12-02 MED ORDER — INSULIN ASPART 100 UNIT/ML IJ SOLN
INTRAMUSCULAR | Status: AC
  Administered 2022-12-02: 4 [IU] via SUBCUTANEOUS
  Filled 2022-12-02: qty 1

## 2022-12-02 MED ORDER — PANTOPRAZOLE SODIUM 40 MG PO TBEC
40.0000 mg | DELAYED_RELEASE_TABLET | Freq: Every day | ORAL | Status: DC
  Administered 2022-12-03 – 2022-12-06 (×4): 40 mg via ORAL
  Filled 2022-12-02 (×4): qty 1

## 2022-12-02 MED ORDER — OXYCODONE HCL 5 MG/5ML PO SOLN: 5.0000 mg | Freq: Four times a day (QID) | ORAL | Status: DC | PRN

## 2022-12-02 MED ORDER — OXYCODONE HCL 5 MG PO TABS
5.0000 mg | ORAL_TABLET | ORAL | Status: DC | PRN
  Administered 2022-12-03 – 2022-12-06 (×7): 5 mg via ORAL
  Filled 2022-12-02 (×7): qty 1

## 2022-12-02 MED ORDER — ROCURONIUM BROMIDE 10 MG/ML (PF) SYRINGE
PREFILLED_SYRINGE | INTRAVENOUS | Status: AC
  Filled 2022-12-02: qty 10

## 2022-12-02 MED ORDER — LACTATED RINGERS IV SOLN: INTRAVENOUS | Status: DC

## 2022-12-02 MED ORDER — HEPARIN SODIUM (PORCINE) 5000 UNIT/ML IJ SOLN
5000.0000 [IU] | Freq: Three times a day (TID) | INTRAMUSCULAR | Status: DC
  Administered 2022-12-02 – 2022-12-06 (×11): 5000 [IU] via SUBCUTANEOUS
  Filled 2022-12-02 (×11): qty 1

## 2022-12-02 MED ORDER — AMISULPRIDE (ANTIEMETIC) 5 MG/2ML IV SOLN: 10.0000 mg | Freq: Once | INTRAVENOUS | Status: DC | PRN

## 2022-12-02 MED ORDER — FENTANYL CITRATE PF 50 MCG/ML IJ SOSY
PREFILLED_SYRINGE | INTRAMUSCULAR | Status: AC
  Administered 2022-12-02: 50 ug via INTRAVENOUS
  Filled 2022-12-02: qty 2

## 2022-12-02 MED ORDER — LIDOCAINE HCL (PF) 2 % IJ SOLN
INTRAMUSCULAR | Status: AC
  Filled 2022-12-02: qty 5

## 2022-12-02 MED ORDER — ONDANSETRON HCL 4 MG/2ML IJ SOLN
INTRAMUSCULAR | Status: AC
  Filled 2022-12-02: qty 2

## 2022-12-02 MED ORDER — SIMETHICONE 80 MG PO CHEW: 80.0000 mg | CHEWABLE_TABLET | Freq: Four times a day (QID) | ORAL | Status: DC | PRN

## 2022-12-02 MED ORDER — PROPOFOL 10 MG/ML IV BOLUS
INTRAVENOUS | Status: DC | PRN
  Administered 2022-12-02: 200 mg via INTRAVENOUS

## 2022-12-02 MED ORDER — INSULIN ASPART 100 UNIT/ML IJ SOLN: 4.0000 [IU] | Freq: Once | INTRAMUSCULAR | Status: AC

## 2022-12-02 MED ORDER — OXYCODONE HCL 5 MG PO TABS
10.0000 mg | ORAL_TABLET | ORAL | Status: DC | PRN
  Administered 2022-12-02 – 2022-12-06 (×8): 10 mg via ORAL
  Filled 2022-12-02 (×8): qty 2

## 2022-12-02 MED ORDER — PANTOPRAZOLE SODIUM 40 MG IV SOLR
40.0000 mg | Freq: Every day | INTRAVENOUS | Status: DC
  Administered 2022-12-02 – 2022-12-03 (×2): 40 mg via INTRAVENOUS
  Filled 2022-12-02 (×2): qty 10

## 2022-12-02 MED ORDER — 0.9 % SODIUM CHLORIDE (POUR BTL) OPTIME
TOPICAL | Status: DC | PRN
  Administered 2022-12-02: 1000 mL

## 2022-12-02 MED ORDER — INSULIN ASPART 100 UNIT/ML IJ SOLN
0.0000 [IU] | Freq: Three times a day (TID) | INTRAMUSCULAR | Status: DC
  Administered 2022-12-02: 5 [IU] via SUBCUTANEOUS
  Administered 2022-12-03: 2 [IU] via SUBCUTANEOUS
  Administered 2022-12-03: 3 [IU] via SUBCUTANEOUS
  Administered 2022-12-03: 2 [IU] via SUBCUTANEOUS
  Administered 2022-12-04: 3 [IU] via SUBCUTANEOUS
  Administered 2022-12-04 (×2): 2 [IU] via SUBCUTANEOUS
  Administered 2022-12-05 – 2022-12-06 (×5): 3 [IU] via SUBCUTANEOUS

## 2022-12-02 MED ORDER — SUGAMMADEX SODIUM 500 MG/5ML IV SOLN
INTRAVENOUS | Status: AC
  Filled 2022-12-02: qty 5

## 2022-12-02 MED ORDER — DEXAMETHASONE SODIUM PHOSPHATE 10 MG/ML IJ SOLN
INTRAMUSCULAR | Status: DC | PRN
  Administered 2022-12-02: 4 mg via INTRAVENOUS

## 2022-12-02 MED ORDER — BUPIVACAINE HCL (PF) 0.25 % IJ SOLN
INTRAMUSCULAR | Status: AC
  Filled 2022-12-02: qty 30

## 2022-12-02 MED ORDER — DOCUSATE SODIUM 100 MG PO CAPS
100.0000 mg | ORAL_CAPSULE | Freq: Two times a day (BID) | ORAL | Status: DC
  Administered 2022-12-02 – 2022-12-06 (×8): 100 mg via ORAL
  Filled 2022-12-02 (×8): qty 1

## 2022-12-02 MED ORDER — CEFAZOLIN IN SODIUM CHLORIDE 3-0.9 GM/100ML-% IV SOLN
3.0000 g | INTRAVENOUS | Status: AC
  Administered 2022-12-02: 3 g via INTRAVENOUS
  Filled 2022-12-02: qty 100

## 2022-12-02 MED ORDER — GABAPENTIN 300 MG PO CAPS
300.0000 mg | ORAL_CAPSULE | Freq: Three times a day (TID) | ORAL | Status: DC
  Administered 2022-12-02 – 2022-12-06 (×12): 300 mg via ORAL
  Filled 2022-12-02 (×12): qty 1

## 2022-12-02 MED ORDER — ESCITALOPRAM OXALATE 10 MG PO TABS
15.0000 mg | ORAL_TABLET | Freq: Every day | ORAL | Status: DC
  Administered 2022-12-03 – 2022-12-06 (×4): 15 mg via ORAL
  Filled 2022-12-02 (×4): qty 2

## 2022-12-02 MED ORDER — ONDANSETRON HCL 4 MG/2ML IJ SOLN
4.0000 mg | INTRAMUSCULAR | Status: DC | PRN
  Administered 2022-12-05 – 2022-12-06 (×2): 4 mg via INTRAVENOUS
  Filled 2022-12-02 (×4): qty 2

## 2022-12-02 MED ORDER — HEPARIN SODIUM (PORCINE) 5000 UNIT/ML IJ SOLN
5000.0000 [IU] | Freq: Once | INTRAMUSCULAR | Status: AC
  Administered 2022-12-02: 5000 [IU] via SUBCUTANEOUS
  Filled 2022-12-02: qty 1

## 2022-12-02 MED ORDER — MORPHINE SULFATE (PF) 2 MG/ML IV SOLN: 1.0000 mg | INTRAVENOUS | Status: DC | PRN

## 2022-12-02 MED ORDER — ACETAMINOPHEN 325 MG PO TABS
650.0000 mg | ORAL_TABLET | Freq: Four times a day (QID) | ORAL | Status: DC
  Administered 2022-12-04 – 2022-12-06 (×6): 650 mg via ORAL
  Filled 2022-12-02 (×7): qty 2

## 2022-12-02 MED ORDER — VITAL HIGH PROTEIN PO LIQD
1000.0000 mL | ORAL | Status: DC
  Administered 2022-12-03: 1000 mL
  Filled 2022-12-02 (×3): qty 1000

## 2022-12-02 MED ORDER — PROMETHAZINE HCL 25 MG/ML IJ SOLN: 6.2500 mg | INTRAMUSCULAR | Status: DC | PRN

## 2022-12-02 MED ORDER — LISINOPRIL 5 MG PO TABS
5.0000 mg | ORAL_TABLET | Freq: Every day | ORAL | Status: DC
  Administered 2022-12-02 – 2022-12-06 (×5): 5 mg via ORAL
  Filled 2022-12-02 (×5): qty 1

## 2022-12-02 MED ORDER — FENTANYL CITRATE (PF) 250 MCG/5ML IJ SOLN
INTRAMUSCULAR | Status: AC
  Filled 2022-12-02: qty 5

## 2022-12-02 MED ORDER — LIDOCAINE 2% (20 MG/ML) 5 ML SYRINGE
INTRAMUSCULAR | Status: DC | PRN
  Administered 2022-12-02: 60 mg via INTRAVENOUS

## 2022-12-02 MED ORDER — ORAL CARE MOUTH RINSE: 15.0000 mL | Freq: Once | OROMUCOSAL | Status: AC

## 2022-12-02 MED ORDER — HYDROMORPHONE HCL 1 MG/ML IJ SOLN
0.5000 mg | INTRAMUSCULAR | Status: DC | PRN
  Administered 2022-12-02 – 2022-12-05 (×6): 0.5 mg via INTRAVENOUS
  Filled 2022-12-02 (×7): qty 0.5

## 2022-12-02 MED ORDER — KETOROLAC TROMETHAMINE 30 MG/ML IJ SOLN: 30.0000 mg | Freq: Once | INTRAMUSCULAR | Status: DC | PRN

## 2022-12-02 MED ORDER — CHLORHEXIDINE GLUCONATE 0.12 % MT SOLN
15.0000 mL | Freq: Once | OROMUCOSAL | Status: AC
  Administered 2022-12-02: 15 mL via OROMUCOSAL

## 2022-12-02 MED ORDER — MIDAZOLAM HCL 5 MG/5ML IJ SOLN
INTRAMUSCULAR | Status: DC | PRN
  Administered 2022-12-02: 2 mg via INTRAVENOUS

## 2022-12-02 MED ORDER — PROCHLORPERAZINE EDISYLATE 10 MG/2ML IJ SOLN
10.0000 mg | INTRAMUSCULAR | Status: DC | PRN
  Administered 2022-12-03: 10 mg via INTRAVENOUS
  Filled 2022-12-02: qty 2

## 2022-12-02 MED ORDER — ACETAMINOPHEN 500 MG PO TABS
1000.0000 mg | ORAL_TABLET | ORAL | Status: AC
  Administered 2022-12-02: 1000 mg via ORAL
  Filled 2022-12-02: qty 2

## 2022-12-02 MED ORDER — ACETAMINOPHEN 500 MG PO TABS
1000.0000 mg | ORAL_TABLET | Freq: Three times a day (TID) | ORAL | Status: DC
  Administered 2022-12-02 – 2022-12-04 (×7): 1000 mg via ORAL
  Filled 2022-12-02 (×10): qty 2

## 2022-12-02 MED ORDER — BUPIVACAINE LIPOSOME 1.3 % IJ SUSP: 20.0000 mL | Freq: Once | INTRAMUSCULAR | Status: DC

## 2022-12-02 MED ORDER — FENTANYL CITRATE (PF) 100 MCG/2ML IJ SOLN
INTRAMUSCULAR | Status: DC | PRN
  Administered 2022-12-02 (×2): 50 ug via INTRAVENOUS
  Administered 2022-12-02: 100 ug via INTRAVENOUS
  Administered 2022-12-02: 50 ug via INTRAVENOUS

## 2022-12-02 SURGICAL SUPPLY — 57 items
APPLIER CLIP 5 13 M/L LIGAMAX5 (MISCELLANEOUS)
APPLIER CLIP ROT 10 11.4 M/L (STAPLE)
BAG COUNTER SPONGE SURGICOUNT (BAG) IMPLANT
BLADE EXTENDED COATED 6.5IN (ELECTRODE) IMPLANT
BLADE SURG SZ10 CARB STEEL (BLADE) IMPLANT
CELLS DAT CNTRL 66122 CELL SVR (MISCELLANEOUS) IMPLANT
CHLORAPREP W/TINT 26 (MISCELLANEOUS) ×1 IMPLANT
CLIP APPLIE 5 13 M/L LIGAMAX5 (MISCELLANEOUS) IMPLANT
CLIP APPLIE ROT 10 11.4 M/L (STAPLE) IMPLANT
COVER MAYO STAND STRL (DRAPES) IMPLANT
COVER SURGICAL LIGHT HANDLE (MISCELLANEOUS) ×1 IMPLANT
DERMABOND ADVANCED .7 DNX12 (GAUZE/BANDAGES/DRESSINGS) IMPLANT
DRAPE SHEET LG 3/4 BI-LAMINATE (DRAPES) IMPLANT
DRAPE WARM FLUID 44X44 (DRAPES) IMPLANT
ELECT REM PT RETURN 15FT ADLT (MISCELLANEOUS) ×1 IMPLANT
GAUZE SPONGE 4X4 12PLY STRL (GAUZE/BANDAGES/DRESSINGS) ×1 IMPLANT
GLOVE BIO SURGEON STRL SZ7.5 (GLOVE) ×1 IMPLANT
GLOVE BIOGEL PI IND STRL 8 (GLOVE) ×1 IMPLANT
GLOVE PI ORTHO PRO STRL 7.5 (GLOVE) ×1 IMPLANT
GOWN STRL REUS W/ TWL XL LVL3 (GOWN DISPOSABLE) ×4 IMPLANT
GOWN STRL REUS W/TWL XL LVL3 (GOWN DISPOSABLE) ×4
HANDLE SUCTION POOLE (INSTRUMENTS) IMPLANT
IRRIG SUCT STRYKERFLOW 2 WTIP (MISCELLANEOUS) ×1
IRRIGATION SUCT STRKRFLW 2 WTP (MISCELLANEOUS) IMPLANT
KIT BASIN OR (CUSTOM PROCEDURE TRAY) ×1 IMPLANT
KIT TURNOVER KIT A (KITS) IMPLANT
LEGGING LITHOTOMY PAIR STRL (DRAPES) IMPLANT
RETRACTOR WND ALEXIS 18 MED (MISCELLANEOUS) IMPLANT
RTRCTR WOUND ALEXIS 18CM MED (MISCELLANEOUS)
SCISSORS LAP 5X35 DISP (ENDOMECHANICALS) ×1 IMPLANT
SHEARS HARMONIC ACE PLUS 36CM (ENDOMECHANICALS) IMPLANT
SLEEVE Z-THREAD 5X100MM (TROCAR) ×1 IMPLANT
SPIKE FLUID TRANSFER (MISCELLANEOUS) ×1 IMPLANT
STAPLER VISISTAT 35W (STAPLE) IMPLANT
STRIP CLOSURE SKIN 1/2X4 (GAUZE/BANDAGES/DRESSINGS) IMPLANT
SUCTION POOLE HANDLE (INSTRUMENTS)
SUT PDS AB 1 TP1 96 (SUTURE) IMPLANT
SUT PROLENE 2 0 KS (SUTURE) IMPLANT
SUT PROLENE 2 0 SH DA (SUTURE) IMPLANT
SUT SILK 0 SH 30 (SUTURE) IMPLANT
SUT SILK 2 0 (SUTURE)
SUT SILK 2 0 SH CR/8 (SUTURE) IMPLANT
SUT SILK 2-0 18XBRD TIE 12 (SUTURE) IMPLANT
SUT SILK 3 0 (SUTURE)
SUT SILK 3 0 SH CR/8 (SUTURE) IMPLANT
SUT SILK 3-0 18XBRD TIE 12 (SUTURE) IMPLANT
SYR BULB IRRIG 60ML STRL (SYRINGE) IMPLANT
SYS LAPSCP GELPORT 120MM (MISCELLANEOUS)
SYSTEM LAPSCP GELPORT 120MM (MISCELLANEOUS) IMPLANT
TOWEL OR 17X26 10 PK STRL BLUE (TOWEL DISPOSABLE) ×1 IMPLANT
TOWEL OR NON WOVEN STRL DISP B (DISPOSABLE) ×1 IMPLANT
TRAY FOLEY MTR SLVR 16FR STAT (SET/KITS/TRAYS/PACK) ×1 IMPLANT
TRAY LAPAROSCOPIC (CUSTOM PROCEDURE TRAY) ×1 IMPLANT
TROCAR 11X100 Z THREAD (TROCAR) IMPLANT
TROCAR Z-THREAD OPTICAL 5X100M (TROCAR) ×1 IMPLANT
TUBE GASTRO BOLUS 18FR ENFIT (TUBING) IMPLANT
YANKAUER SUCT BULB TIP NO VENT (SUCTIONS) IMPLANT

## 2022-12-02 NOTE — Op Note (Signed)
Patient: Elizabeth [name] ([birth year], [id number])  Date of Surgery: 12/02/2022   Preoperative Diagnosis: ABDOMINAL PAIN AFTER GASTRIC BYPASS   Postoperative Diagnosis: ABDOMINAL PAIN AFTER GASTRIC BYPASS   Surgical Procedure: UPPER ENDOSCOPY: [id number] LAPAROSCOPY DIAGNOSTIC: [id number] (CPT) LAPAROSCOPIC REMNANT GASTROSTOMY G TUBE: [id number] (CPT)   Operative Team Members:  Surgeon(s) and Role:    * Krystale Rinkenberger, Nickola Major, MD - Primary    * Clovis Riley, MD - Assisting   Anesthesiologist: Murvin Natal, MD CRNA: Montel Clock, CRNA   Anesthesia: General   Fluids:  Total I/O In: 1000 [I.V.:900; IV Piggyback:100] Out: 10 [id number]  Complications: None  Drains:   18 French gastrostomy tube    Specimen: None  Disposition:  PACU - hemodynamically stable.  Plan of Care: Admit for overnight observation    Indications for Procedure: Danetra [name] is a 47 y.o. female who presented with abdominal pain 2 months after a gastric bypass.  Workup including CT and lab work failed to provide For the patient's symptoms.  I recommended upper endoscopy, possible diagnostic laparoscopy, possible laparoscopic remnant G-tube placement.  The procedure itself as well as its risks, benefits and alternatives were discussed.  The risks discussed included but were not limited to the risk of infection, bleeding, damage to nearby structures, and continued abdominal pain.  After a full discussion and all questions answered the patient granted consent to proceed.  Findings: Some adhesions between the jejunojejunostomy and the Roux limb, a band adhesion between the transverse colon and the falciform ligament, these were divided.   Description of Procedure:   On the date stated above the patient was taken the operating room suite and placed in supine position.  General endotracheal anesthesia was induced.  A timeout was completed verifying the correct patient, procedure, position, and equipment  needed for the case.  I began by doing the upper endoscopy.  The adult upper endoscope was inserted through the mouth down the esophagus into the stomach.  I advanced through the pouch and into the Roux limb.  I did not see any abnormalities, there is no signs of ulceration, stricture, or other explanation for the patient's symptoms.  At this point the endoscope was removed and we proceeded with laparoscopic surgery.    The patient's abdomen was prepped and draped in usual sterile fashion.  I entered the patient's right upper quadrant using a 5 mm trocar with optical guidance.  The abdomen was insufflated 15 mmHg.  3 additional 5 mm trocars were placed across the abdomen and the patient's previous scars.  The abdomen was inspected.  There was a band adhesion between the transverse colon and the falciform ligament of the liver.  This was divided using endoscopic harmonic scalpel.  There was dense adhesions between the mesentery of the Roux limb and the jejunojejunostomy.  These were divided sharply using laparoscopic shears.  I felt these adhesions did not clearly explain her symptoms and the best plan moving forward would be to place a remnant gastrostomy tube.  The remnant stomach was mobilized away from the gastrojejunostomy and the Roux limb using the LigaSure.  Just medial to the Roux limb a pursestring silk suture was positioned at the site of gastrostomy tube placement.  Four silk sutures were placed, in each cardinal direction around this pursestring, and brought out through the abdominal wall using a PMI suture passer.  A 5 mm trocar was placed through the abdominal wall in the position of the G-tube.  The harmonic scalpel was used  to create a gastrostomy.  The 5 mm trocar was removed and the gastrostomy tube was inserted into the abdomen and then into the remnant stomach.  The pursestring suture was tied down around the gastrostomy tube.  The balloon was inflated.  The stay sutures were tied down and the  abdomen was desufflated.  All sponge and needle counts were correct at the end of this case.  The skin was closed using 4-0 Monocryl and Dermabond.  A sterile dressing was applied around the G-tube site.  At the end of the case we reviewed the infection status of the case. Patient: Private Patient Elective Case Case: Elective Infection Present At Time Of Surgery (PATOS):  Some contamination related to creating a g-tube  Elizabeth Raw, MD General, Bariatric, & Minimally Invasive Surgery [facility] Surgery, [place]

## 2022-12-02 NOTE — Progress Notes (Addendum)
Rechecked BP 170/90 30 mins after BP meds.

## 2022-12-02 NOTE — Progress Notes (Signed)
Paged Dr Rosendo Gros due to BP 182/108. Orders to give Lisinopril home med 5 mg PO.

## 2022-12-02 NOTE — H&P (Signed)
Admitting Physician: [place]  Service: Bariatric Surgery  CC: Abdominal pain  Subjective   HPI: Elizabeth [name] is an 47 y.o. female who is here for abdominal pain.  She underwent robotic gastric bypass with hiatal hernia repair on 09/30/2022.  She did well for the first month after surgery, then has developed abdominal pain.  The pain did not improve with IV fluids.  There was no explanation for her pain on CT or blood work.  She presents today for EGD, possible laparoscopy, possible g-tube to help determine what is causing this pain.  Past Medical History:  Diagnosis Date   Anemia    Anxiety    Astigmatism    Depression    Diabetes mellitus without complication (HCC)    Dry mouth    Fatigue    GERD (gastroesophageal reflux disease)    Heartburn    Hepatic steatosis    Hepatomegaly    High cholesterol    Hypertension    Keratoconjunctivitis    Liver disease    Muscle pain    Ovarian cyst    Positive ANA (antinuclear antibody)    Pulmonary nodules    Vitamin D deficiency    Weakness     Past Surgical History:  Procedure Laterality Date   BIOPSY  01/18/2021   Procedure: BIOPSY;  Surgeon: Lavena Bullion, DO;  Location: [facility];  Service: Gastroenterology;;   CHOLECYSTECTOMY N/A 05/11/2021   Procedure: LAPAROSCOPIC CHOLECYSTECTOMY WITH INTRAOPERATIVE CHOLANGIOGRAM;  Surgeon: Felicie Morn, MD;  Location: [facility];  Service: General;  Laterality: N/A;   DILATION AND CURETTAGE OF UTERUS     ENDOMETRIAL BIOPSY  12/19/2017   normal per pt   ESOPHAGOGASTRODUODENOSCOPY (EGD) WITH PROPOFOL N/A 01/18/2021   Procedure: ESOPHAGOGASTRODUODENOSCOPY (EGD) WITH PROPOFOL;  Surgeon: Lavena Bullion, DO;  Location: [facility];  Service: Gastroenterology;  Laterality: N/A;   HIATAL HERNIA REPAIR N/A 09/30/2022   Procedure: HERNIA REPAIR HIATAL;  Surgeon: Felicie Morn, MD;  Location: [facility];  Service: General;  Laterality: N/A;   LAPAROSCOPIC  ROUX-EN-Y GASTRIC BYPASS WITH HIATAL HERNIA REPAIR  09/30/2022   TONSILLECTOMY     TUBAL LIGATION     UPPER GI ENDOSCOPY N/A 09/30/2022   Procedure: UPPER GI ENDOSCOPY;  Surgeon: Felicie Morn, MD;  Location: [facility];  Service: General;  Laterality: N/A;    Family History  Problem Relation Age of Onset   Diabetes Mother    Hypertension Mother    Hyperlipidemia Mother    Obesity Mother    Peripheral Artery Disease Mother    HIV Father        died from complications (IVDU)   Alcohol abuse Father    Drug abuse Father    Ovarian cancer Maternal Grandmother    Colon cancer Neg Hx    Esophageal cancer Neg Hx    Liver disease Neg Hx    Pancreatic cancer Neg Hx    Stomach cancer Neg Hx    Sleep apnea Neg Hx     Social:  reports that she quit smoking about 15 months ago. Her smoking use included cigarettes. She has a 1.50 pack-year smoking history. She has never used smokeless tobacco. She reports that she does not drink alcohol and does not use drugs.  Allergies:  Allergies  Allergen Reactions   Tomato Hives, Itching and Rash    Medications: Current Outpatient Medications  Medication Instructions   Blood Glucose Monitoring Suppl (FREESTYLE LITE) w/Device KIT Use to check blood  sugar 4 times a day as directed   CALCIUM-VITAMIN D PO 2 tablets, Oral, Every evening   escitalopram (LEXAPRO) 15 mg, Oral, Daily   etonogestrel (NEXPLANON) 68 MG IMPL implant 1 each, Subdermal,  Once   famotidine (PEPCID) 20 mg, Oral, Daily   glucose blood (FREESTYLE LITE) test strip Use to check blood sugar 4 times a day as directed   hydrOXYzine (ATARAX) 25 MG tablet Take 2 tablets by mouth at bedtime as needed for insomnia.   Incassia 0.35 mg, Oral, Daily   Lancets (FREESTYLE) lancets Use to check blood sugar 4 times a day as directed   lisinopril (ZESTRIL) 5 mg, Oral, Daily   lovastatin (MEVACOR) 40 MG tablet TAKE 1 TABLET BY MOUTH AT BEDTIME.   Multiple Vitamin (MULTIVITAMIN WITH MINERALS)  TABS tablet 2 tablets, Oral, Every evening   omeprazole (PRILOSEC) 40 mg, Oral, 2 times daily   ondansetron (ZOFRAN-ODT) 4 mg, Oral, Every 6 hours PRN   oxyCODONE-acetaminophen (PERCOCET/ROXICET) 5-325 MG tablet Take 1 tablet by mouth every 6 (six) hours as needed for pain.   pantoprazole (PROTONIX) 40 mg, Oral, Daily   sucralfate (CARAFATE) 1 g tablet Take 1 tablet (1 g total) by mouth 4 (four) times daily before meals and nightly.   Trulicity 1.5 mg, Subcutaneous, Weekly   Trulicity 1.5 mg, Subcutaneous, Weekly    ROS - all of the below systems have been reviewed with the patient and positives are indicated with bold text General: chills, fever or night sweats Eyes: blurry vision or double vision ENT: epistaxis or sore throat Allergy/Immunology: itchy/watery eyes or nasal congestion Hematologic/Lymphatic: bleeding problems, blood clots or swollen lymph nodes Endocrine: temperature intolerance or unexpected weight changes Breast: new or changing breast lumps or nipple discharge Resp: cough, shortness of breath, or wheezing CV: chest pain or dyspnea on exertion GI: as per HPI GU: dysuria, trouble voiding, or hematuria MSK: joint pain or joint stiffness Neuro: TIA or stroke symptoms Derm: pruritus and skin lesion changes Psych: anxiety and depression  Objective   PE Blood pressure (!) 151/93, pulse 88, temperature 99.2 F (37.3 C), temperature source Oral, resp. rate 12, height 5' 2"$  (1.575 m), weight 127 kg, last menstrual period 10/26/2022, SpO2 99 %. Constitutional: NAD; conversant; no deformities Eyes: Moist conjunctiva; no lid lag; anicteric; PERRL Neck: Trachea midline; no thyromegaly Lungs: Normal respiratory effort; no tactile fremitus CV: RRR; no palpable thrills; no pitting edema GI: Abd Soft, nontender; no palpable hepatosplenomegaly MSK: Normal range of motion of extremities; no clubbing/cyanosis Psychiatric: Appropriate affect; alert and oriented x3 Lymphatic: No  palpable cervical or axillary lymphadenopathy  Results for orders placed or performed during the hospital encounter of 11/29/22 (from the past 24 hour(s))  Glucose, capillary     Status: Abnormal   Collection Time: 12/02/22  7:39 AM  Result Value Ref Range   Glucose-Capillary 184 (H) 70 - 99 mg/dL   Comment 1 Notify RN    Comment 2 Document in Chart     Latest Reference Range & Units 11/24/22 03:05  Sodium 135 - 145 mmol/L 135  Potassium 3.5 - 5.1 mmol/L 3.9  Chloride 98 - 111 mmol/L 103  CO2 22 - 32 mmol/L 22  Glucose 70 - 99 mg/dL 211 (H)  BUN 6 - 20 mg/dL 9  Creatinine 0.44 - 1.00 mg/dL 0.82  Calcium 8.9 - 10.3 mg/dL 8.5 (L)  Anion gap 5 - 15  10  Alkaline Phosphatase 38 - 126 U/L 89  Albumin 3.5 - 5.0 g/dL  3.3 (L)  Lipase 11 - 51 U/L 54 (H)  AST 15 - 41 U/L 96 (H)  ALT 0 - 44 U/L 94 (H)  Total Protein 6.5 - 8.1 g/dL 6.3 (L)  Total Bilirubin 0.3 - 1.2 mg/dL 0.1 (L)  GFR, Estimated >60 mL/min >60  (H): Data is abnormally high (L): Data is abnormally low  Latest Reference Range & Units 11/24/22 03:05  WBC 4.0 - 10.5 K/uL 6.2  RBC 3.87 - 5.11 MIL/uL 5.10  Hemoglobin 12.0 - 15.0 g/dL 11.8 (L)  HCT 36.0 - 46.0 % 38.2  MCV 80.0 - 100.0 fL 74.9 (L)  MCH 26.0 - 34.0 pg 23.1 (L)  MCHC 30.0 - 36.0 g/dL 30.9  RDW 11.5 - 15.5 % 16.8 (H)  Platelets 150 - 400 K/uL 291  nRBC 0.0 - 0.2 % 0.0  (L): Data is abnormally low (H): Data is abnormally high    Imaging Orders  No imaging studies ordered today   CT Abd/Pel 11/24/22  1. No evidence for aortic dissection or aneurysm. 2. Bronchial wall thickening with scattered peripheral areas of tree-in-bud nodularity compatible with nonspecific inflammatory or infectious bronchiolitis. 3. Multiple pulmonary nodules. Most significant: 8 mm left solid pulmonary nodule within the upper lobe. Per Fleischner Society Guidelines, recommend a non-contrast Chest CT at 3-6 months, then consider another non-contrast Chest CT at 18-24 months. If  patient is low risk for malignancy, non-contrast Chest CT at 18-24 months is optional. 4. No acute findings within the abdomen or pelvis. 5. Hepatic steatosis. 6. 2.1 cm right ovary cyst.  No follow-up imaging recommended.  CT Abd/Pel 11/15/22  1. No acute findings. 2. Hepatomegaly. 3. Post gastric bypass surgery.     Assessment and Plan   Elizabeth [name] is an 47 y.o. female with abdominal pain after gastric bypass with hiatal hernia repair.  I recommended EGD, possible diagnostic laparoscopy, possible g-tube placement.  We discussed the procedure, its risks, benefits and alternatives.  After a full discussion and all questions answered, the patient granted consent to proceed.  We will proceed as scheduled.    Felicie Morn, MD  [facility] Use [email or web address] to contact on call provider

## 2022-12-02 NOTE — Transfer of Care (Signed)
Immediate Anesthesia Transfer of Care Note  Patient: Elizabeth [name]  Procedure(s) Performed: UPPER ENDOSCOPY LAPAROSCOPY DIAGNOSTIC LAPAROSCOPIC REMNANT GASTROSTOMY G TUBE  Patient Location: PACU  Anesthesia Type:General  Level of Consciousness: drowsy and patient cooperative  Airway & Oxygen Therapy: Patient Spontanous Breathing and Patient connected to face mask oxygen  Post-op Assessment: Report given to RN and Post -op Vital signs reviewed and stable  Post vital signs: Reviewed and stable  Last Vitals:  Vitals Value Taken Time  BP 159/97 12/02/22 1122  Temp    Pulse 102 12/02/22 1125  Resp 17 12/02/22 1125  SpO2 100 % 12/02/22 1125  Vitals shown include unvalidated device data.  Last Pain:  Vitals:   12/02/22 0736  TempSrc: Oral         Complications: No notable events documented.

## 2022-12-02 NOTE — Anesthesia Procedure Notes (Signed)
Procedure Name: Intubation Date/Time: 12/02/2022 9:28 AM  Performed by: Montel Clock, CRNAPre-anesthesia Checklist: Patient identified, Emergency Drugs available, Suction available, Patient being monitored and Timeout performed Patient Re-evaluated:Patient Re-evaluated prior to induction Oxygen Delivery Method: Circle system utilized Preoxygenation: Pre-oxygenation with 100% oxygen Induction Type: IV induction and Rapid sequence Laryngoscope Size: Mac and 3 Grade View: Grade I Tube type: Oral Tube size: 7.0 mm Number of attempts: 1 Airway Equipment and Method: Stylet Placement Confirmation: ETT inserted through vocal cords under direct vision, positive ETCO2 and breath sounds checked- equal and bilateral Secured at: 22 cm Tube secured with: Tape Dental Injury: Teeth and Oropharynx as per pre-operative assessment

## 2022-12-02 NOTE — Anesthesia Preprocedure Evaluation (Addendum)
Anesthesia Evaluation  Patient identified by MRN, date of birth, ID band Patient awake    Reviewed: Allergy & Precautions, NPO status , Patient's Chart, lab work & pertinent test results  Airway Mallampati: III  TM Distance: >3 FB Neck ROM: Full    Dental no notable dental hx.    Pulmonary former smoker   Pulmonary exam normal        Cardiovascular hypertension, Pt. on medications Normal cardiovascular exam     Neuro/Psych  PSYCHIATRIC DISORDERS Anxiety Depression    negative neurological ROS     GI/Hepatic Neg liver ROS, hiatal hernia,GERD  Medicated and Controlled,,  Endo/Other  diabetes  Morbid obesity (SUPER)  Renal/GU negative Renal ROS     Musculoskeletal  (+) Arthritis ,    Abdominal  (+) + obese  Peds  Hematology negative hematology ROS (+)   Anesthesia Other Findings ABDOMINAL PAIN AFTER GASTRIC BYPASS  Reproductive/Obstetrics Hcg negative                              Anesthesia Physical Anesthesia Plan  ASA: 4  Anesthesia Plan: General   Post-op Pain Management:    Induction: Intravenous  PONV Risk Score and Plan: 3 and Ondansetron, Dexamethasone and Treatment may vary due to age or medical condition  Airway Management Planned: Oral ETT  Additional Equipment:   Intra-op Plan:   Post-operative Plan: Extubation in OR  Informed Consent: I have reviewed the patients History and Physical, chart, labs and discussed the procedure including the risks, benefits and alternatives for the proposed anesthesia with the patient or authorized representative who has indicated his/her understanding and acceptance.     Dental advisory given  Plan Discussed with: CRNA  Anesthesia Plan Comments:         Anesthesia Quick Evaluation

## 2022-12-03 ENCOUNTER — Encounter (HOSPITAL_COMMUNITY): Payer: Self-pay | Admitting: Surgery

## 2022-12-03 LAB — HEPATIC FUNCTION PANEL
ALT: 31 U/L (ref 0–44)
AST: 39 U/L (ref 15–41)
Albumin: 3.2 g/dL — ABNORMAL LOW (ref 3.5–5.0)
Alkaline Phosphatase: 60 U/L (ref 38–126)
Bilirubin, Direct: 0.3 mg/dL — ABNORMAL HIGH (ref 0.0–0.2)
Indirect Bilirubin: 0.7 mg/dL (ref 0.3–0.9)
Total Bilirubin: 1 mg/dL (ref 0.3–1.2)
Total Protein: 6.6 g/dL (ref 6.5–8.1)

## 2022-12-03 LAB — GLUCOSE, CAPILLARY
Glucose-Capillary: 122 mg/dL — ABNORMAL HIGH (ref 70–99)
Glucose-Capillary: 150 mg/dL — ABNORMAL HIGH (ref 70–99)

## 2022-12-03 LAB — BASIC METABOLIC PANEL
Anion gap: 11 (ref 5–15)
BUN: 5 mg/dL — ABNORMAL LOW (ref 6–20)
CO2: 22 mmol/L (ref 22–32)
Calcium: 8.7 mg/dL — ABNORMAL LOW (ref 8.9–10.3)
Chloride: 105 mmol/L (ref 98–111)
Creatinine, Ser: 0.68 mg/dL (ref 0.44–1.00)
GFR, Estimated: >60 mL/min (ref >60)
Glucose, Bld: 119 mg/dL — ABNORMAL HIGH (ref 70–99)
Potassium: 3.9 mmol/L (ref 3.5–5.1)
Sodium: 138 mmol/L (ref 135–145)

## 2022-12-03 LAB — CBC
HCT: 37.6 % (ref 36.0–46.0)
Hemoglobin: 11.6 g/dL — ABNORMAL LOW (ref 12.0–15.0)
MCH: 23.2 pg — ABNORMAL LOW (ref 26.0–34.0)
MCHC: 30.9 g/dL (ref 30.0–36.0)
MCV: 75.4 fL — ABNORMAL LOW (ref 80.0–100.0)
Platelets: 254 10*3/uL (ref 150–400)
RBC: 4.99 MIL/uL (ref 3.87–5.11)
RDW: 16.9 % — ABNORMAL HIGH (ref 11.5–15.5)
WBC: 8.4 10*3/uL (ref 4.0–10.5)
nRBC: 0 % (ref 0.0–0.2)

## 2022-12-03 MED ORDER — ADULT MULTIVITAMIN W/MINERALS CH
1.0000 | ORAL_TABLET | Freq: Two times a day (BID) | ORAL | Status: DC
  Administered 2022-12-04 – 2022-12-06 (×5): 1 via ORAL
  Filled 2022-12-03 (×5): qty 1

## 2022-12-03 MED ORDER — PROSOURCE TF20 ENFIT COMPATIBL EN LIQD
60.0000 mL | Freq: Three times a day (TID) | ENTERAL | Status: DC
  Administered 2022-12-03 – 2022-12-06 (×8): 60 mL
  Filled 2022-12-03 (×18): qty 60

## 2022-12-03 NOTE — TOC Initial Note (Signed)
Transition of Care [facility]) - Initial/Assessment Note   Patient Details  Name: Elizabeth [name] MRN: [id number] Date of Birth: 06-25-1976  Transition of [facility]) CM/SW Contact:    Sherie Don, LCSW Phone Number: 12/03/2022, 3:14 PM  Clinical Narrative: TOC consulted for tube feedings that will be cycled at night. CSW made tentative referral to [facility] with [facility]. Patient is not expected to need a tube feeding pump at this time as long as the patient is able to tolerate bolus feedings. CSW updated patient regarding referral. TOC to follow.  Expected Discharge Plan: [place] Barriers to Discharge: Continued Medical Work up  Patient Goals and CMS Choice Patient states their goals for this hospitalization and ongoing recovery are:: Return home CMS Medicare.gov Compare Post Acute Care list provided to:: Patient Choice offered to / list presented to : Patient  Expected Discharge Plan and Services In-house Referral: Clinical Social Work Post Acute Care Choice: [place] arrangements for the past 2 months: [place]           DME Arranged: Tube feeding [place] Agency: [facility] Date HH Agency Contacted: 12/03/22 Representative spoke with at [place]: [place]  Prior Living Arrangements/Services Living arrangements for the past 2 months: [place] Lives with:: Spouse Patient language and need for interpreter reviewed:: Yes Do you feel safe going back to the place where you live?: Yes      Need for Family Participation in Patient Care: No (Comment) Care giver support system in place?: Yes (comment) Criminal Activity/Legal Involvement Pertinent to Current Situation/Hospitalization: No - Comment as needed  Activities of Daily Living Home Assistive Devices/Equipment: CBG Meter, Blood pressure cuff ADL Screening (condition at time of admission) Patient's cognitive ability adequate to safely complete daily activities?: Yes Is the patient deaf or have  difficulty hearing?: No Does the patient have difficulty seeing, even when wearing glasses/contacts?: No Does the patient have difficulty concentrating, remembering, or making decisions?: No Patient able to express need for assistance with ADLs?: Yes Does the patient have difficulty dressing or bathing?: No Independently performs ADLs?: Yes (appropriate for developmental age) Does the patient have difficulty walking or climbing stairs?: No Weakness of Legs: None Weakness of Arms/Hands: None  Permission Sought/Granted Permission granted to share information with : Yes, Verbal Permission Granted Permission granted to share info w AGENCY: [facility]  Emotional Assessment Attitude/Demeanor/Rapport: Engaged Affect (typically observed): Accepting Orientation: : Oriented to Self, Oriented to Place, Oriented to  Time, Oriented to Situation Alcohol / Substance Use: Not Applicable  Admission diagnosis:  History of gastric bypass [Z98.84] Patient Active Problem List   Diagnosis Date Noted   History of gastric bypass 12/02/2022   Positive ANA (antinuclear antibody) 11/26/2022   Excessive bleeding in premenopausal period 11/11/2022   Osteoarthritis of knee 03/19/2022   Left knee pain 02/22/2022   Preventative health care 12/31/2021   Drug-induced constipation 12/31/2021   Depression 11/07/2021   Chronic abdominal pain 04/06/2021   Grief reaction 04/06/2021   Gastroesophageal reflux disease without esophagitis    Hyperlipidemia 01/21/2018   Uncontrolled type 2 diabetes mellitus with hyperglycemia ([place]) 01/21/2018   Keratoconjunctivitis sicca of both eyes not specified as Sjogren's 04/04/2017   Regular astigmatism of both eyes 04/04/2017   Vitamin D deficiency 12/02/2016   Essential hypertension 02/07/2016   Morbid obesity with BMI of 50.0-59.9, adult ([place]) 02/07/2016   PCP:  [name], NP Pharmacy:   [facility] [address],  [place] [address] Phone: [phone] Fax: [place] [address] Phone: [phone] Fax: [phone]  [facility] [place] [place] [address] Phone: [phone] Fax: [phone]  [facility] [phone] - [place], [place] - [address] AT [place] [address] [place] [address] Phone: [phone] Fax: [phone]  Social Determinants of Health (SDOH) Social History: [place]: No Food Insecurity (12/02/2022)  Housing: Low Risk  (12/02/2022)  Transportation Needs: No Transportation Needs (12/02/2022)  Utilities: Not At Risk (12/02/2022)  Depression (PHQ2-9): Low Risk  (04/29/2022)  Tobacco Use: Medium Risk (12/03/2022)   SDOH Interventions:    Readmission Risk Interventions    10/01/2022   10:32 AM  Readmission Risk Prevention Plan  Post Dischage Appt Complete  Medication Screening Complete  Transportation Screening Complete

## 2022-12-03 NOTE — Progress Notes (Signed)
Progress Note: General Surgery Service   Chief Complaint/Subjective: Pain RUQ continues. Drinking liquids fine.  Some nausea.  Pain around G-tube  Objective: Vital signs in last 24 hours: Temp:  [97.7 F (36.5 C)-98.7 F (37.1 C)] 98.7 F (37.1 C) (02/13 0556) Pulse Rate:  [72-103] 75 (02/13 0556) Resp:  [14-25] 18 (02/13 0556) BP: (149-182)/(86-108) 149/93 (02/13 0556) SpO2:  [93 %-100 %] 96 % (02/13 0556)    Intake/Output from previous day: 02/12 0701 - 02/13 0700 In: 3455 [P.O.:660; I.V.:2695; IV Piggyback:100] Out: 1610 [Urine:1600; Blood:10] Intake/Output this shift: No intake/output data recorded.  GI: Abd G tube in place, incisions c/d/I w/ glue  Lab Results: CBC  Recent Labs    12/02/22 1304 12/03/22 0436  WBC 10.3 8.4  HGB 12.4 11.6*  HCT 41.5 37.6  PLT 239 254   BMET Recent Labs    12/02/22 1304 12/03/22 0436  NA  --  138  K  --  3.9  CL  --  105  CO2  --  22  GLUCOSE  --  119*  BUN  --  5*  CREATININE 0.79 0.68  CALCIUM  --  8.7*   PT/INR No results for input(s): "LABPROT", "INR" in the last 72 hours. ABG No results for input(s): "PHART", "HCO3" in the last 72 hours.  Invalid input(s): "PCO2", "PO2"  Anti-infectives: Anti-infectives (From admission, onward)    Start     Dose/Rate Route Frequency Ordered Stop   12/02/22 0730  ceFAZolin (ANCEF) IVPB 3g/100 mL premix        3 g 200 mL/hr over 30 Minutes Intravenous On call to O.R. 12/02/22 0728 12/02/22 1000       Medications: Scheduled Meds:  acetaminophen  1,000 mg Oral Q8H   Or   acetaminophen (TYLENOL) oral liquid 160 mg/5 mL  1,000 mg Oral Q8H   acetaminophen  650 mg Oral Q6H   docusate sodium  100 mg Oral BID   escitalopram  15 mg Oral Daily   feeding supplement (VITAL HIGH PROTEIN)  1,000 mL Per Tube Q24H   gabapentin  300 mg Oral TID   heparin injection (subcutaneous)  5,000 Units Subcutaneous Q8H   insulin aspart  0-15 Units Subcutaneous TID WC   insulin aspart  0-5  Units Subcutaneous QHS   lisinopril  5 mg Oral Daily   pantoprazole  40 mg Oral Daily   pantoprazole (PROTONIX) IV  40 mg Intravenous QHS   Ensure Max Protein  2 oz Oral Q2H   sucralfate  1 g Oral TID AC & HS   Continuous Infusions:  lactated ringers 100 mL/hr at 12/03/22 0521   methocarbamol (ROBAXIN) IV     PRN Meds:.HYDROmorphone (DILAUDID) injection, methocarbamol (ROBAXIN) IV, morphine injection, ondansetron (ZOFRAN) IV, ondansetron (ZOFRAN) IV, oxyCODONE, oxyCODONE, oxyCODONE, prochlorperazine, simethicone  Assessment/Plan: s/p Procedure(s): UPPER ENDOSCOPY LAPAROSCOPY DIAGNOSTIC LAPAROSCOPIC REMNANT GASTROSTOMY G TUBE 12/02/2022  Doing fine POD1.  RUQ pain continues.    Slowly advance remnant tube feeds - plan for discharge home eventually with cycled tube feeds at night. Monitor labs for refeeding syndrome   LOS: 0 days   FEN: [place] full liquid diet ID: none VTE: Heparin, SCDs Foley: none Dispo: Continued care on floor    Felicie Morn, MD  [facility] Use [email or web address] to contact on call provider  Daily Billing: [phone] - post op

## 2022-12-03 NOTE — Anesthesia Postprocedure Evaluation (Signed)
Anesthesia Post Note  Patient: Government social research officer  Procedure(s) Performed: UPPER ENDOSCOPY LAPAROSCOPY DIAGNOSTIC LAPAROSCOPIC REMNANT GASTROSTOMY G TUBE     Patient location during evaluation: PACU Anesthesia Type: General Level of consciousness: awake Pain management: pain level controlled Vital Signs Assessment: post-procedure vital signs reviewed and stable Respiratory status: spontaneous breathing, nonlabored ventilation and respiratory function stable Cardiovascular status: blood pressure returned to baseline and stable Postop Assessment: no apparent nausea or vomiting Anesthetic complications: no   No notable events documented.  Last Vitals:  Vitals:   12/03/22 0123 12/03/22 0556  BP: (!) 154/92 (!) 149/93  Pulse: 72 75  Resp: 18 18  Temp: 36.8 C 37.1 C  SpO2: 97% 96%    Last Pain:  Vitals:   12/03/22 0935  TempSrc:   PainSc: Asleep                 Antjuan Rothe P Kyreese Chio

## 2022-12-03 NOTE — Progress Notes (Signed)
Initial Nutrition Assessment  DOCUMENTATION CODES:   Morbid obesity  INTERVENTION:   Monitor magnesium, potassium, and phosphorus for at least 3 days, MD to replete as needed, as pt is at risk for refeeding syndrome.  -Initiate Vital HP @ 20 ml/hr x 24 hours. -60 ml Prosource TF 20 BID -Provides 480 kcals, 82g protein, 53g CHO and 401 ml H2O.  On 2/14:  -Provide Osmolite 1.2 @ 30 ml/hr via G-tube x 12 hours. -60 ml Prosource TF 20 TID -Provides 672 kcals, 79g protein, 56g CHO and 295 ml H2O  On 2/15: -Provide Osmolite 1.2 @ 40 ml/hr via G-tube x 12 hours. -60 ml Prosource TF 20 TID -Provides 816 kcals, 86g protein, 75g CHO and 393 ml H2O  PO: -Continue Ensure MAX Protein po BID, each supplement provides 150 kcal and 30 grams of protein as tolerated -Multivitamin with minerals BID  Goal regimen for discharge: -Osmolite 1.2 @ 40 ml/hr via G-tube x 12 hours -60 ml Prosource TF 20 TID -Provides 816 kcals, 86g protein, 75g CHO and 393 ml H2O  -PO would provide additional kcals, protein and fluid -Continue supplement as tolerated -Continue bariatric MVI and calcium supplement regimen once home  NUTRITION DIAGNOSIS:   Inadequate oral intake related to nausea, vomiting (abdominal pain) as evidenced by per patient/family report.  GOAL:   Patient will meet greater than or equal to 90% of their needs  MONITOR:   PO intake, Supplement acceptance, Labs, Weight trends, I & O's  REASON FOR ASSESSMENT:   Consult Enteral/tube feeding initiation and management  ASSESSMENT:   47 y.o. female who presented with abdominal pain 2 months after a gastric bypass. 09/30/22: s/p RNY gastric bypass  2/12: admitted, s/p upper endoscopy, dx lap, remnant G-tube placement  RD consulted for tube feeding initiation and management per surgery. Pt started Vital HP today at 20 ml/hr. Will continue this order for next 24 hours to see if pt tolerates. If does okay, can switch to Osmolite 1.2  which has a bit more kcals and in combination with Prosource TF can meet pt's protein needs.  Will need to advance slowly given risk of refeeding syndrome and tolerance of volume of remnant stomach. Pt is on bariatric full liquids. Taking in some liquids currently, Ensure Max, 2 oz at a time.   Per surgeon, goal of tube feeding should meet protein needs but still promote weight loss so will keep kcal lower than goal.   Per patient, she began to have difficulty tolerating solid foods a few weeks ago. Was having N/V. Pt was still able to drink fluids. Denies issues with swallowing or chewing. Pt has been taking a bariatric MVI and Calcium with vitamin D daily until 2/11.  Pre-surgery weight: ~314 lbs Current weight: 280 lbs  Medications: Colace, Carafate, Lactated ringers, Compazine  Labs reviewed: CBGs: 122-252   NUTRITION - FOCUSED PHYSICAL EXAM:  No depletions noted.  Diet Order:   Diet Order             Diet bariatric full liquid Room service appropriate? Yes; Fluid consistency: Thin  Diet effective now                   EDUCATION NEEDS:   Education needs have been addressed  Skin:  Skin Assessment: Skin Integrity Issues: Skin Integrity Issues:: Incisions Incisions: 2/13 abdomen  Last BM:  PTA  Height:   Ht Readings from Last 1 Encounters:  12/02/22 5' 2"$  (1.575 m)    Weight:  Wt Readings from Last 1 Encounters:  12/02/22 127 kg    BMI:  Body mass index is 51.21 kg/m.  Estimated Nutritional Needs:   Kcal:  1500-1700  Protein:  75-85g  Fluid:  >1.9L/day   Clayton Bibles, MS, RD, LDN Inpatient Clinical Dietitian Contact information available via Amion

## 2022-12-04 ENCOUNTER — Telehealth: Payer: Commercial Managed Care - PPO | Admitting: Family

## 2022-12-04 DIAGNOSIS — Z813 Family history of other psychoactive substance abuse and dependence: Secondary | ICD-10-CM | POA: Diagnosis not present

## 2022-12-04 DIAGNOSIS — Z79899 Other long term (current) drug therapy: Secondary | ICD-10-CM | POA: Diagnosis not present

## 2022-12-04 DIAGNOSIS — I10 Essential (primary) hypertension: Secondary | ICD-10-CM | POA: Diagnosis present

## 2022-12-04 DIAGNOSIS — F419 Anxiety disorder, unspecified: Secondary | ICD-10-CM | POA: Diagnosis present

## 2022-12-04 DIAGNOSIS — K660 Peritoneal adhesions (postprocedural) (postinfection): Secondary | ICD-10-CM | POA: Diagnosis present

## 2022-12-04 DIAGNOSIS — F32A Depression, unspecified: Secondary | ICD-10-CM | POA: Diagnosis present

## 2022-12-04 DIAGNOSIS — K219 Gastro-esophageal reflux disease without esophagitis: Secondary | ICD-10-CM | POA: Diagnosis present

## 2022-12-04 DIAGNOSIS — R1011 Right upper quadrant pain: Secondary | ICD-10-CM | POA: Diagnosis present

## 2022-12-04 DIAGNOSIS — R110 Nausea: Secondary | ICD-10-CM | POA: Diagnosis present

## 2022-12-04 DIAGNOSIS — Z83438 Family history of other disorder of lipoprotein metabolism and other lipidemia: Secondary | ICD-10-CM | POA: Diagnosis not present

## 2022-12-04 DIAGNOSIS — Z833 Family history of diabetes mellitus: Secondary | ICD-10-CM | POA: Diagnosis not present

## 2022-12-04 DIAGNOSIS — Z87891 Personal history of nicotine dependence: Secondary | ICD-10-CM | POA: Diagnosis not present

## 2022-12-04 DIAGNOSIS — K9589 Other complications of other bariatric procedure: Secondary | ICD-10-CM | POA: Diagnosis present

## 2022-12-04 DIAGNOSIS — E119 Type 2 diabetes mellitus without complications: Secondary | ICD-10-CM | POA: Diagnosis present

## 2022-12-04 DIAGNOSIS — Z8041 Family history of malignant neoplasm of ovary: Secondary | ICD-10-CM | POA: Diagnosis not present

## 2022-12-04 DIAGNOSIS — K760 Fatty (change of) liver, not elsewhere classified: Secondary | ICD-10-CM | POA: Diagnosis present

## 2022-12-04 DIAGNOSIS — Y832 Surgical operation with anastomosis, bypass or graft as the cause of abnormal reaction of the patient, or of later complication, without mention of misadventure at the time of the procedure: Secondary | ICD-10-CM | POA: Diagnosis present

## 2022-12-04 DIAGNOSIS — E7800 Pure hypercholesterolemia, unspecified: Secondary | ICD-10-CM | POA: Diagnosis present

## 2022-12-04 DIAGNOSIS — Z8249 Family history of ischemic heart disease and other diseases of the circulatory system: Secondary | ICD-10-CM | POA: Diagnosis not present

## 2022-12-04 DIAGNOSIS — Z9049 Acquired absence of other specified parts of digestive tract: Secondary | ICD-10-CM | POA: Diagnosis not present

## 2022-12-04 DIAGNOSIS — Z6841 Body Mass Index (BMI) 40.0 and over, adult: Secondary | ICD-10-CM | POA: Diagnosis not present

## 2022-12-04 DIAGNOSIS — Z811 Family history of alcohol abuse and dependence: Secondary | ICD-10-CM | POA: Diagnosis not present

## 2022-12-04 LAB — HEPATIC FUNCTION PANEL
ALT: 35 U/L (ref 0–44)
AST: 36 U/L (ref 15–41)
Albumin: 3.1 g/dL — ABNORMAL LOW (ref 3.5–5.0)
Alkaline Phosphatase: 68 U/L (ref 38–126)
Bilirubin, Direct: 0.2 mg/dL (ref 0.0–0.2)
Indirect Bilirubin: 0.5 mg/dL (ref 0.3–0.9)
Total Bilirubin: 0.7 mg/dL (ref 0.3–1.2)
Total Protein: 6.3 g/dL — ABNORMAL LOW (ref 6.5–8.1)

## 2022-12-04 LAB — CBC
HCT: 37.6 % (ref 36.0–46.0)
Hemoglobin: 11.5 g/dL — ABNORMAL LOW (ref 12.0–15.0)
MCH: 23 pg — ABNORMAL LOW (ref 26.0–34.0)
MCHC: 30.6 g/dL (ref 30.0–36.0)
MCV: 75.2 fL — ABNORMAL LOW (ref 80.0–100.0)
Platelets: 231 10*3/uL (ref 150–400)
RBC: 5 MIL/uL (ref 3.87–5.11)
RDW: 16.7 % — ABNORMAL HIGH (ref 11.5–15.5)
WBC: 5.5 10*3/uL (ref 4.0–10.5)
nRBC: 0 % (ref 0.0–0.2)

## 2022-12-04 LAB — BASIC METABOLIC PANEL
Anion gap: 9 (ref 5–15)
BUN: 6 mg/dL (ref 6–20)
CO2: 21 mmol/L — ABNORMAL LOW (ref 22–32)
Calcium: 8.3 mg/dL — ABNORMAL LOW (ref 8.9–10.3)
Chloride: 106 mmol/L (ref 98–111)
Creatinine, Ser: 0.53 mg/dL (ref 0.44–1.00)
GFR, Estimated: >60 mL/min (ref >60)
Glucose, Bld: 150 mg/dL — ABNORMAL HIGH (ref 70–99)
Potassium: 3.7 mmol/L (ref 3.5–5.1)
Sodium: 136 mmol/L (ref 135–145)

## 2022-12-04 LAB — MAGNESIUM: Magnesium: 1.4 mg/dL — ABNORMAL LOW (ref 1.7–2.4)

## 2022-12-04 LAB — GLUCOSE, CAPILLARY
Glucose-Capillary: 157 mg/dL — ABNORMAL HIGH (ref 70–99)
Glucose-Capillary: 152 mg/dL — ABNORMAL HIGH (ref 70–99)
Glucose-Capillary: 133 mg/dL — ABNORMAL HIGH (ref 70–99)
Glucose-Capillary: 183 mg/dL — ABNORMAL HIGH (ref 70–99)
Glucose-Capillary: 156 mg/dL — ABNORMAL HIGH (ref 70–99)
Glucose-Capillary: 180 mg/dL — ABNORMAL HIGH (ref 70–99)

## 2022-12-04 LAB — PHOSPHORUS: Phosphorus: 3.6 mg/dL (ref 2.5–4.6)

## 2022-12-04 MED ORDER — OSMOLITE 1.2 CAL PO LIQD
360.0000 mL | ORAL | Status: DC
  Administered 2022-12-04: 360 mL
  Filled 2022-12-04: qty 474

## 2022-12-04 MED ORDER — MAGNESIUM SULFATE 4 GM/100ML IV SOLN
4.0000 g | Freq: Once | INTRAVENOUS | Status: AC
  Administered 2022-12-04: 4 g via INTRAVENOUS
  Filled 2022-12-04: qty 100

## 2022-12-04 NOTE — Progress Notes (Signed)
47 y.o female admitted with abdominal pain, back pain and nausea. Patient alert and oriented x4. Round and reactive to light accomodation for eyes. Normal breathing sounds. Abdomen was slightly tender with active bowel sounds. Patient reported her last bowel movement was 12/01/22. Skin was dry and intact with the exception of the incision for the GI tube placed.  Patient states a pain of 8 out of 10. Pain medicine administered by staff RN. No skin abrasions except for on right brachial area from IV placement. Patient is independent and can ambulate minimal assistance. Patient lying down watching TV.  [id number] Tami Ribas 12/04/2022  Student RN

## 2022-12-04 NOTE — TOC Progression Note (Signed)
Transition of Care [facility]) - Progression Note   Patient Details  Name: Elizabeth [name] MRN: [id number] Date of Birth: 1976/08/21  Transition of Care [facility]) CM/SW [place], LCSW Phone Number: 12/04/2022, 2:56 PM  Clinical Narrative: CSW made [facility] referrals to the following agencies:  [facility]: declined due to lack of nursing availability [facility]: declined, no nursing availability [facility]: out of network [facility]: declined, no nursing availability [facility]: declined [facility]: out of [facility]: accepted  Per patient, her son Ke'Von will be her teachable caregiver. DME order is in for tube feedings. Pam with [facility] following and Cindie with [facility]. CSW updated patient regarding HHRN being set up with [facility].  Expected Discharge Plan: [place] Barriers to Discharge: Continued Medical Work up  Expected Discharge Plan and Services In-house Referral: Clinical Social Work Post Acute Care Choice: [place] arrangements for the past 2 months: [place]             DME Arranged: Tube feeding, Tube feeding pump DME Agency: Other - Comment Ysidro Evert) Date DME Agency Contacted: 12/03/22 Representative spoke with at DME Agency: Jeannene Patella HH Arranged: RN [place] Agency: [facility] Date [place]: 12/04/22 Representative spoke with at [place]: [place] Determinants of Health ([place]) Interventions SDOH Screenings   Food Insecurity: No Food Insecurity (12/02/2022)  Housing: Low Risk  (12/02/2022)  Transportation Needs: No Transportation Needs (12/02/2022)  Utilities: Not At Risk (12/02/2022)  Depression (PHQ2-9): Low Risk  (04/29/2022)  Tobacco Use: Medium Risk (12/03/2022)   Readmission Risk Interventions    10/01/2022   10:32 AM  Readmission Risk Prevention Plan  Post Dischage Appt Complete  Medication Screening Complete  Transportation Screening Complete

## 2022-12-04 NOTE — Progress Notes (Addendum)
Brief Nutrition Note  Per RN, patient tolerating tube feeds well.  No oral intake documented since diet advanced. Per surgery note this AM, pt had nausea with Ensure Max yesterday.  Will plan to discontinue Vital High Protein this afternoon and change to planned home TF formula of Osmolite 1.2 this evening. Increase to 18m/hr, running 12 hours overnight from 6pm-6am. Plan discussed with RN.   Tonight: -Provide Osmolite 1.2 @ 30 ml/hr via G-tube x 12 hours. -60 ml Prosource TF 20 TID -Provides 672 kcals, 79g protein, 56g CHO and 295 ml H2O   Monitor magnesium, potassium, and phosphorus for at least 3 days, MD to replete as needed, as pt is at risk for refeeding syndrome.    Further recommendations as below: On 2/15: -Provide Osmolite 1.2 @ 40 ml/hr via G-tube x 12 hours. -60 ml Prosource TF 20 TID -Provides 816 kcals, 86g protein, 75g CHO and 393 ml H2O   PO: -Continue Ensure MAX Protein po BID, each supplement provides 150 kcal and 30 grams of protein as tolerated -Multivitamin with minerals BID   Goal regimen for discharge: -Osmolite 1.2 @ 40 ml/hr via G-tube x 12 hours -60 ml Prosource TF 20 TID -Provides 816 kcals, 86g protein, 75g CHO and 393 ml H2O   -PO would provide additional kcals, protein and fluid -Continue supplement as tolerated -Continue bariatric MVI and calcium supplement regimen once home   ASamson FredericRD, LDN For contact information, refer to [facility]

## 2022-12-04 NOTE — Progress Notes (Signed)
Progress Note: General Surgery Service   Chief Complaint/Subjective: Continued RUQ and back pain.  Nausea with protein shakes yesterday  Objective: Vital signs in last 24 hours: Temp:  [98.1 F (36.7 C)-98.7 F (37.1 C)] 98.3 F (36.8 C) (02/14 0900) Pulse Rate:  [70-80] 72 (02/14 0900) Resp:  [16-18] 18 (02/14 0900) BP: (141-166)/(83-98) 166/93 (02/14 0900) SpO2:  [97 %-100 %] 98 % (02/14 0900) Last BM Date : 12/01/22  Intake/Output from previous day: 02/13 0701 - 02/14 0700 In: 3328.7 [P.O.:600; I.V.:2353; NG/GT:315.7] Out: 2700 [Urine:2700] Intake/Output this shift: Total I/O In: 0  Out: 400 [Urine:400]  GI: Abd G tube in place, incisions c/d/I w/ glue  Lab Results: CBC  Recent Labs    12/03/22 0436 12/04/22 0448  WBC 8.4 5.5  HGB 11.6* 11.5*  HCT 37.6 37.6  PLT 254 231    BMET Recent Labs    12/03/22 0436 12/04/22 0448  NA 138 136  K 3.9 3.7  CL 105 106  CO2 22 21*  GLUCOSE 119* 150*  BUN 5* 6  CREATININE 0.68 0.53  CALCIUM 8.7* 8.3*    PT/INR No results for input(s): "LABPROT", "INR" in the last 72 hours. ABG No results for input(s): "PHART", "HCO3" in the last 72 hours.  Invalid input(s): "PCO2", "PO2"  Anti-infectives: Anti-infectives (From admission, onward)    Start     Dose/Rate Route Frequency Ordered Stop   12/02/22 0730  ceFAZolin (ANCEF) IVPB 3g/100 mL premix        3 g 200 mL/hr over 30 Minutes Intravenous On call to O.R. 12/02/22 0728 12/02/22 1000       Medications: Scheduled Meds:  acetaminophen  1,000 mg Oral Q8H   Or   acetaminophen (TYLENOL) oral liquid 160 mg/5 mL  1,000 mg Oral Q8H   acetaminophen  650 mg Oral Q6H   docusate sodium  100 mg Oral BID   escitalopram  15 mg Oral Daily   feeding supplement (PROSource TF20)  60 mL Per Tube TID   feeding supplement (VITAL HIGH PROTEIN)  1,000 mL Per Tube Q24H   gabapentin  300 mg Oral TID   heparin injection (subcutaneous)  5,000 Units Subcutaneous Q8H   insulin  aspart  0-15 Units Subcutaneous TID WC   insulin aspart  0-5 Units Subcutaneous QHS   lisinopril  5 mg Oral Daily   multivitamin with minerals  1 tablet Oral BID   pantoprazole  40 mg Oral Daily   pantoprazole (PROTONIX) IV  40 mg Intravenous QHS   Ensure Max Protein  2 oz Oral Q2H   sucralfate  1 g Oral TID AC & HS   Continuous Infusions:  lactated ringers 100 mL/hr at 12/04/22 1029   magnesium sulfate bolus IVPB 4 g (12/04/22 1038)   methocarbamol (ROBAXIN) IV     PRN Meds:.HYDROmorphone (DILAUDID) injection, methocarbamol (ROBAXIN) IV, morphine injection, ondansetron (ZOFRAN) IV, ondansetron (ZOFRAN) IV, oxyCODONE, oxyCODONE, oxyCODONE, prochlorperazine, simethicone  Assessment/Plan: s/p Procedure(s): UPPER ENDOSCOPY LAPAROSCOPY DIAGNOSTIC LAPAROSCOPIC REMNANT GASTROSTOMY G TUBE 12/02/2022  Doing fine POD2.  RUQ pain and nausea continues.    Slowly advance remnant tube feeds - plan for discharge home eventually with cycled tube feeds at night. Monitor labs for refeeding syndrome Hypomagnesemia - replace  Once tube feeds are at goal inpatient, we will try to cycle or bolus feeds and discharge home   LOS: 0 days   FEN: Bari full liquid diet ID: none VTE: Heparin, SCDs Foley: none Dispo: Continued care on floor  Felicie Morn, MD  [facility] Use [email or web address] to contact on call provider  Daily Billing: [phone] - post op

## 2022-12-05 ENCOUNTER — Other Ambulatory Visit (HOSPITAL_BASED_OUTPATIENT_CLINIC_OR_DEPARTMENT_OTHER): Payer: Self-pay

## 2022-12-05 LAB — CBC
HCT: 41.5 % (ref 36.0–46.0)
Hemoglobin: 12.6 g/dL (ref 12.0–15.0)
MCH: 23.3 pg — ABNORMAL LOW (ref 26.0–34.0)
MCHC: 30.4 g/dL (ref 30.0–36.0)
MCV: 76.7 fL — ABNORMAL LOW (ref 80.0–100.0)
Platelets: 230 10*3/uL (ref 150–400)
RBC: 5.41 MIL/uL — ABNORMAL HIGH (ref 3.87–5.11)
RDW: 17.3 % — ABNORMAL HIGH (ref 11.5–15.5)
WBC: 5.7 10*3/uL (ref 4.0–10.5)
nRBC: 0 % (ref 0.0–0.2)

## 2022-12-05 LAB — GLUCOSE, CAPILLARY
Glucose-Capillary: 178 mg/dL — ABNORMAL HIGH (ref 70–99)
Glucose-Capillary: 177 mg/dL — ABNORMAL HIGH (ref 70–99)
Glucose-Capillary: 186 mg/dL — ABNORMAL HIGH (ref 70–99)
Glucose-Capillary: 157 mg/dL — ABNORMAL HIGH (ref 70–99)

## 2022-12-05 LAB — HEPATIC FUNCTION PANEL
ALT: 51 U/L — ABNORMAL HIGH (ref 0–44)
AST: 64 U/L — ABNORMAL HIGH (ref 15–41)
Albumin: 3.3 g/dL — ABNORMAL LOW (ref 3.5–5.0)
Alkaline Phosphatase: 76 U/L (ref 38–126)
Bilirubin, Direct: 0.1 mg/dL (ref 0.0–0.2)
Indirect Bilirubin: 0.5 mg/dL (ref 0.3–0.9)
Total Bilirubin: 0.6 mg/dL (ref 0.3–1.2)
Total Protein: 6.7 g/dL (ref 6.5–8.1)

## 2022-12-05 LAB — BASIC METABOLIC PANEL
Anion gap: 10 (ref 5–15)
BUN: 9 mg/dL (ref 6–20)
CO2: 26 mmol/L (ref 22–32)
Calcium: 8.8 mg/dL — ABNORMAL LOW (ref 8.9–10.3)
Chloride: 102 mmol/L (ref 98–111)
Creatinine, Ser: 0.67 mg/dL (ref 0.44–1.00)
GFR, Estimated: >60 mL/min (ref >60)
Glucose, Bld: 164 mg/dL — ABNORMAL HIGH (ref 70–99)
Potassium: 3.9 mmol/L (ref 3.5–5.1)
Sodium: 138 mmol/L (ref 135–145)

## 2022-12-05 LAB — MAGNESIUM: Magnesium: 1.9 mg/dL (ref 1.7–2.4)

## 2022-12-05 LAB — PHOSPHORUS: Phosphorus: 3.7 mg/dL (ref 2.5–4.6)

## 2022-12-05 MED ORDER — OSMOLITE 1.2 CAL PO LIQD
480.0000 mL | ORAL | Status: DC
  Administered 2022-12-05: 480 mL
  Filled 2022-12-05: qty 711

## 2022-12-05 NOTE — Progress Notes (Signed)
Nutrition Follow-up  DOCUMENTATION CODES:   Morbid obesity  INTERVENTION:   Monitor magnesium, potassium, and phosphorus for at least 3 days, MD to replete as needed, as pt is at risk for refeeding syndrome.   2/15: -Run Osmolite 1.2 @ goal rate of 40 ml/hr x 12 hours via G-tube -60 ml Prosource TF 20 TID -Provides 816 kcals, 86g protein, 75g CHO and 393 ml H2O   PO: -Continue Ensure MAX Protein po BID, each supplement provides 150 kcal and 30 grams of protein as tolerated -Multivitamin with minerals BID  -PO to provide additional kcals, protein and fluid -Continue supplement as tolerated -Continue bariatric MVI and calcium supplement regimen once home  [place] recommendations:  -Provide 1/2 carton (120 ml) Osmolite 1.2 QID via G-tube -60 ml Prosource TF 20 TID -Provides 810 kcals, 86g protein, 75g CHO and 390 ml H2O  NUTRITION DIAGNOSIS:   Inadequate oral intake related to nausea, vomiting (abdominal pain) as evidenced by per patient/family report.  Ongoing.  GOAL:   Patient will meet greater than or equal to 90% of their needs  Progressing with TF.  MONITOR:   PO intake, Supplement acceptance, Labs, Weight trends, I & O's   ASSESSMENT:   47 y.o. female who presented with abdominal pain 2 months after a gastric bypass.  2/12: admitted, s/p upper endoscopy, dx lap, remnant G-tube placement   Patient in room, state she is doing well with her feeds. As far as her PO Ensure Max, she states she gets really full after consuming >6 oz total (provides ~80 kcals, 16g protein). Pt has continued to sip on liquids.   Discussed bolus feedings with surgeon via secure chat. Would like pt to trial bolus feedings and surgery feels pt will do well with bolus feedings of ~120 ml. Will start at 120 ml QID 2/16. In combination with Prosource, will meet protein needs.  Pre-surgery weight: ~314 lbs  Admission weight: 280 lbs Current weight: 287 lbs  Medications: Colace,  Multivitamin with minerals daily, Carafate  Labs reviewed: CBGs: 133-183  Diet Order:   Diet Order             Diet bariatric full liquid Room service appropriate? Yes; Fluid consistency: Thin  Diet effective now                   EDUCATION NEEDS:   Education needs have been addressed  Skin:  Skin Assessment: Skin Integrity Issues: Skin Integrity Issues:: Incisions Incisions: 2/13 abdomen  Last BM:  2/11  Height:   Ht Readings from Last 1 Encounters:  12/02/22 5' 2"$  (1.575 m)    Weight:   Wt Readings from Last 1 Encounters:  12/05/22 130.4 kg    BMI:  Body mass index is 52.58 kg/m.  Estimated Nutritional Needs:   Kcal:  1500-1700  Protein:  75-85g  Fluid:  >1.9L/day  Clayton Bibles, MS, RD, LDN Inpatient Clinical Dietitian Contact information available via Amion

## 2022-12-06 ENCOUNTER — Ambulatory Visit: Payer: Commercial Managed Care - PPO | Admitting: Physician Assistant

## 2022-12-06 ENCOUNTER — Other Ambulatory Visit (HOSPITAL_BASED_OUTPATIENT_CLINIC_OR_DEPARTMENT_OTHER): Payer: Self-pay

## 2022-12-06 LAB — GASTROINTESTINAL PANEL BY PCR, STOOL (REPLACES STOOL CULTURE)

## 2022-12-06 LAB — BASIC METABOLIC PANEL
Anion gap: 14 (ref 5–15)
BUN: 11 mg/dL (ref 6–20)
CO2: 23 mmol/L (ref 22–32)
Calcium: 8.9 mg/dL (ref 8.9–10.3)
Chloride: 102 mmol/L (ref 98–111)
Creatinine, Ser: 0.62 mg/dL (ref 0.44–1.00)
GFR, Estimated: >60 mL/min (ref >60)
Glucose, Bld: 186 mg/dL — ABNORMAL HIGH (ref 70–99)
Potassium: 4 mmol/L (ref 3.5–5.1)
Sodium: 139 mmol/L (ref 135–145)

## 2022-12-06 LAB — HEPATIC FUNCTION PANEL
ALT: 126 U/L — ABNORMAL HIGH (ref 0–44)
AST: 107 U/L — ABNORMAL HIGH (ref 15–41)
Albumin: 3.1 g/dL — ABNORMAL LOW (ref 3.5–5.0)
Alkaline Phosphatase: 94 U/L (ref 38–126)
Bilirubin, Direct: 0.1 mg/dL (ref 0.0–0.2)
Indirect Bilirubin: 0.3 mg/dL (ref 0.3–0.9)
Total Bilirubin: 0.4 mg/dL (ref 0.3–1.2)
Total Protein: 6.4 g/dL — ABNORMAL LOW (ref 6.5–8.1)

## 2022-12-06 LAB — CBC
HCT: 39.7 % (ref 36.0–46.0)
Hemoglobin: 12.1 g/dL (ref 12.0–15.0)
MCH: 23 pg — ABNORMAL LOW (ref 26.0–34.0)
MCHC: 30.5 g/dL (ref 30.0–36.0)
MCV: 75.6 fL — ABNORMAL LOW (ref 80.0–100.0)
Platelets: 251 10*3/uL (ref 150–400)
RBC: 5.25 MIL/uL — ABNORMAL HIGH (ref 3.87–5.11)
RDW: 16.5 % — ABNORMAL HIGH (ref 11.5–15.5)
WBC: 5.5 10*3/uL (ref 4.0–10.5)
nRBC: 0 % (ref 0.0–0.2)

## 2022-12-06 LAB — PHOSPHORUS: Phosphorus: 3 mg/dL (ref 2.5–4.6)

## 2022-12-06 LAB — GLUCOSE, CAPILLARY
Glucose-Capillary: 179 mg/dL — ABNORMAL HIGH (ref 70–99)
Glucose-Capillary: 160 mg/dL — ABNORMAL HIGH (ref 70–99)

## 2022-12-06 LAB — MAGNESIUM: Magnesium: 1.9 mg/dL (ref 1.7–2.4)

## 2022-12-06 MED ORDER — METHOCARBAMOL 750 MG PO TABS
750.0000 mg | ORAL_TABLET | Freq: Four times a day (QID) | ORAL | 0 refills | Status: AC
  Filled 2022-12-06: qty 30, 8d supply, fill #0

## 2022-12-06 MED ORDER — OSMOLITE 1.2 CAL PO LIQD
120.0000 mL | Freq: Four times a day (QID) | ORAL | Status: DC
  Administered 2022-12-06: 120 mL
  Filled 2022-12-06 (×3): qty 237

## 2022-12-06 MED ORDER — OXYCODONE-ACETAMINOPHEN 5-325 MG PO TABS
1.0000 | ORAL_TABLET | ORAL | 0 refills | Status: AC | PRN
  Filled 2022-12-06: qty 28, 5d supply, fill #0

## 2022-12-06 NOTE — TOC Transition Note (Addendum)
Transition of Care [facility]) - CM/SW Discharge Note   Patient Details  Name: Elizabeth [name] MRN: [id number] Date of Birth: [birth year]  Transition of Care [facility]) CM/SW Contact:  Elizabeth Dine, RN Phone Number: 12/06/2022, 11:24 AM   Clinical Narrative:    Notified pt will d/c home today; HHRN Elizabeth [name]) previously arranged by Elizabeth [name] at [facility]  notified and she says today's dose of TF will be delivered to pt's home; pt agrees to d/c plan; no further TOC needs.  -[id number]- notified by Elizabeth [name] at [place]; TF orders changed; dietician note dated 12/06/22: Home recommendations: -1/2 carton (120 ml) Osmolite 1.2 QID via G-tube -60 ml Prosource TF 20 TID (or equivalent) -30 ml free water before and after each feeding (240 ml daily) -Provides 810 kcals, 86g protein, 75g CHO and 630 ml H2O Per [place] will deliver 6 doses of prosource to pt's room; she also says Cindie, RN from [place] notified; notified Elizabeth [name] and Elizabeth [name], Elizabeth [name] via secure chat of need for orders; Elizabeth [name] deferred to MD; awaiting orders.  Final next level of care: Home w Home Health Services Barriers to Discharge: No Barriers Identified   Patient Goals and CMS Choice CMS Medicare.gov Compare Post Acute Care list provided to:: Patient Choice offered to / list presented to : Patient  Discharge Placement                         Discharge Plan and Services Additional resources added to the After Visit Summary for   In-house Referral: Clinical Social Work   Post Acute Care Choice: Home Health          DME Arranged: Tube feeding, Tube feeding pump DME Agency: Other - Comment Elizabeth [name]) Date DME Agency Contacted: 12/03/22   Representative spoke with at DME Agency: Elizabeth [name] HH Arranged: RN [place] Agency: [facility] Date [place]: 12/04/22   Representative spoke with at [place]: Elizabeth [name] of Health ([place]) Interventions SDOH Screenings   Food Insecurity:  No Food Insecurity (12/02/2022)  Housing: Low Risk  (12/02/2022)  Transportation Needs: No Transportation Needs (12/02/2022)  Utilities: Not At Risk (12/02/2022)  Depression (PHQ2-9): Low Risk  (04/29/2022)  Tobacco Use: Medium Risk (12/03/2022)     Readmission Risk Interventions    10/01/2022   10:32 AM  Readmission Risk Prevention Plan  Post Dischage Appt Complete  Medication Screening Complete  Transportation Screening Complete

## 2022-12-06 NOTE — Progress Notes (Signed)
Patient was given discharge instructions, and all questions were answered.  Patient was stable for discharge and was taken to the main exit by wheelchair. 

## 2022-12-06 NOTE — Discharge Summary (Signed)
Patient ID: Elizabeth [name] [id number] 47 y.o. 1976/07/30  12/02/2022  Discharge date and time: 12/06/2022  Admitting Physician: [place]  Discharge Physician: [place]  Admission Diagnoses: History of gastric bypass [Z98.84] Patient Active Problem List   Diagnosis Date Noted   History of gastric bypass 12/02/2022   Positive ANA (antinuclear antibody) 11/26/2022   Excessive bleeding in premenopausal period 11/11/2022   Osteoarthritis of knee 03/19/2022   Left knee pain 02/22/2022   Preventative health care 12/31/2021   Drug-induced constipation 12/31/2021   Depression 11/07/2021   Chronic abdominal pain 04/06/2021   Grief reaction 04/06/2021   Gastroesophageal reflux disease without esophagitis    Hyperlipidemia 01/21/2018   Uncontrolled type 2 diabetes mellitus with hyperglycemia ([place]) 01/21/2018   Keratoconjunctivitis sicca of both eyes not specified as Sjogren's 04/04/2017   Regular astigmatism of both eyes 04/04/2017   Vitamin D deficiency 12/02/2016   Essential hypertension 02/07/2016   Morbid obesity with BMI of 50.0-59.9, adult ([place]) 02/07/2016     Discharge Diagnoses:  Patient Active Problem List   Diagnosis Date Noted   History of gastric bypass 12/02/2022   Positive ANA (antinuclear antibody) 11/26/2022   Excessive bleeding in premenopausal period 11/11/2022   Osteoarthritis of knee 03/19/2022   Left knee pain 02/22/2022   Preventative health care 12/31/2021   Drug-induced constipation 12/31/2021   Depression 11/07/2021   Chronic abdominal pain 04/06/2021   Grief reaction 04/06/2021   Gastroesophageal reflux disease without esophagitis    Hyperlipidemia 01/21/2018   Uncontrolled type 2 diabetes mellitus with hyperglycemia ([place]) 01/21/2018   Keratoconjunctivitis sicca of both eyes not specified as Sjogren's 04/04/2017   Regular astigmatism of both eyes 04/04/2017   Vitamin D deficiency 12/02/2016   Essential hypertension 02/07/2016    Morbid obesity with BMI of 50.0-59.9, adult ([place]) 02/07/2016    Operations: Procedure(s): UPPER ENDOSCOPY LAPAROSCOPY DIAGNOSTIC LAPAROSCOPIC REMNANT GASTROSTOMY G TUBE  Admission Condition: good  Discharged Condition: good  Indication for Admission: Abdominal pain, nausea, vomiting after gastric bypass  Hospital Course:  Elizabeth [name] is a 47 y.o. female who presented with abdominal pain 2 months after a gastric bypass.  Workup including CT and lab work failed to provide an explanation for the patient's symptoms.  She underwent upper endoscopy, diagnostic laparoscopy, laparoscopic remnant G-tube placement on 12/02/22.  She recovered well in the hospital with slow advancement of tube feeds and was discharged on 12/06/22.  Trulicity has been stopped for two weeks now and the pain is starting to ease off.  I asked the patient to talk to her PCP about this before starting the medication back up.  Consults: None  Significant Diagnostic Studies: None  Treatments: Surgery as above  Disposition: Home  Patient Instructions:  Allergies as of 12/06/2022       Reactions   Tomato Hives, Itching, Rash        Medication List     STOP taking these medications    pantoprazole 40 MG tablet Commonly known as: PROTONIX   Trulicity 1.5 0000000 Sopn Generic drug: Dulaglutide       TAKE these medications    CALCIUM-VITAMIN D PO Take 2 tablets by mouth every evening.   escitalopram 10 MG tablet Commonly known as: Lexapro Take 1.5 tablets (15 mg total) by mouth daily.   famotidine 20 MG tablet Commonly known as: Pepcid Take 1 tablet (20 mg total) by mouth daily.   freestyle lancets Use to check blood sugar 4 times a day as directed  FREESTYLE LITE test strip Generic drug: glucose blood Use to check blood sugar 4 times a day as directed   FreeStyle Lite w/Device Kit Use to check blood sugar 4 times a day as directed   hydrOXYzine 25 MG tablet Commonly known as:  ATARAX Take 2 tablets by mouth at bedtime as needed for insomnia.   Incassia 0.35 MG tablet Generic drug: norethindrone Take 1 tablet (0.35 mg total) by mouth daily.   lisinopril 5 MG tablet Commonly known as: ZESTRIL Take 1 tablet (5 mg total) by mouth daily.   lovastatin 40 MG tablet Commonly known as: MEVACOR TAKE 1 TABLET BY MOUTH AT BEDTIME.   methocarbamol 750 MG tablet Commonly known as: Robaxin-750 Take 1 tablet (750 mg total) by mouth 4 (four) times daily.   multivitamin with minerals Tabs tablet Take 2 tablets by mouth every evening.   Nexplanon 68 MG Impl implant Generic drug: etonogestrel 1 each by Subdermal route once.   omeprazole 40 MG capsule Commonly known as: PRILOSEC Take 1 capsule (40 mg total) by mouth in the morning and at bedtime.   ondansetron 4 MG disintegrating tablet Commonly known as: ZOFRAN-ODT Take 1 tablet (4 mg total) by mouth every 6 (six) hours as needed for nausea or vomiting.   oxyCODONE-acetaminophen 5-325 MG tablet Commonly known as: Percocet Take 1 tablet by mouth every 4 (four) hours as needed for up to 7 days for severe pain. What changed:  when to take this reasons to take this   sucralfate 1 g tablet Commonly known as: CARAFATE Take 1 tablet (1 g total) by mouth 4 (four) times daily before meals and nightly.               Durable Medical Equipment  (From admission, onward)           Start     Ordered   12/04/22 1431  For home use only DME Other see comment  Once       Comments: Tube feeds per RD -Osmolite 1.2 @ 40 ml/hr via G-tube x 12 hours -60 ml Prosource TF 20 TID  Question:  Length of Need  Answer:  6 Months   12/04/22 1431            Activity: no heavy lifting for 4 weeks Diet:  PO diet for comfort, would stick to bariatric full liquids.  Tube feeds via G-tube per nutrition recommendations - option for cycled at night or bolus feeds per patient lifestyle. Wound Care: keep wound clean and  dry  Follow-up:  With Dr. Thermon Leyland in 4 weeks.  Signed: Nickola Major Q[place] [facility] B[facility] In[facility] [place]   12/06/2022, 11:28 AM

## 2022-12-06 NOTE — Progress Notes (Signed)
Nutrition Follow-up  DOCUMENTATION CODES:   Morbid obesity  INTERVENTION:   Home recommendations: -1/2 carton (120 ml) Osmolite 1.2 QID via G-tube -60 ml Prosource TF 20 TID (or equivalent) -30 ml free water before and after each feeding (240 ml daily) -Provides 810 kcals, 86g protein, 75g CHO and 630 ml H2O  PO: -Continue Ensure MAX Protein po BID, each supplement provides 150 kcal and 30 grams of protein as tolerated -Multivitamin with minerals BID   -PO to provide additional kcals, protein and fluid -Continue supplement as tolerated -Continue bariatric MVI and calcium supplement regimen once home  NUTRITION DIAGNOSIS:   Inadequate oral intake related to nausea, vomiting (abdominal pain) as evidenced by per patient/family report.  Ongoing.  GOAL:   Patient will meet greater than or equal to 90% of their needs  Progressing.  MONITOR:   PO intake, Supplement acceptance, Labs, Weight trends, I & O's, TF tolerance   ASSESSMENT:   47 y.o. female who presented with abdominal pain 2 months after a gastric bypass.  2/12: admitted, s/p upper endoscopy, dx lap, remnant G-tube placement    Patient states she tolerated her night feeds at 40 ml/hr well last night.  She is ready to trial bolus feeds. Has been educated by [employer] rep and have discussed plan. Pt reports she has been sipping on Ensure Max, ~6 oz and ate some yogurt this morning.   Pt expected to discharge today.  Admission weight: 280 lbs Current weight: 287 lbs  Medications: Colace, Carafate, Zofran  Labs reviewed: CBGs: 157-186  Diet Order:   Diet Order             Diet - low sodium heart healthy           Diet bariatric full liquid Room service appropriate? Yes; Fluid consistency: Thin  Diet effective now                   EDUCATION NEEDS:   Education needs have been addressed  Skin:  Skin Assessment: Skin Integrity Issues: Skin Integrity Issues:: Incisions Incisions: 2/13  abdomen  Last BM:  2/15 -type  6&7  Height:   Ht Readings from Last 1 Encounters:  12/02/22 5' 2"$  (1.575 m)    Weight:   Wt Readings from Last 1 Encounters:  12/06/22 130.3 kg    BMI:  Body mass index is 52.54 kg/m.  Estimated Nutritional Needs:   Kcal:  1500-1700  Protein:  75-85g  Fluid:  >1.9L/day  Clayton Bibles, MS, RD, LDN Inpatient Clinical Dietitian Contact information available via Amion

## 2022-12-09 ENCOUNTER — Telehealth: Payer: Commercial Managed Care - PPO | Admitting: Family

## 2022-12-09 ENCOUNTER — Ambulatory Visit: Payer: 59 | Admitting: Obstetrics & Gynecology

## 2022-12-09 ENCOUNTER — Telehealth: Payer: Self-pay

## 2022-12-09 NOTE — Transitions of Care (Post Inpatient/ED Visit) (Signed)
   12/09/2022  Name: Elizabeth [name] MRN: [id number] DOB: 02/06/1976  Today's TOC FU Call Status: Today's TOC FU Call Status:: Successful TOC FU Call Competed TOC FU Call Complete Date: 12/09/22  Transition Care Management Follow-up Telephone Call Discharge Facility: [facility]) Type of Discharge: Inpatient Admission Primary Inpatient Discharge Diagnosis:: DM How have you been since you were released from the hospital?: Better Any questions or concerns?: No  Items Reviewed: Did you receive and understand the discharge instructions provided?: Yes Medications obtained and verified?: Yes (Medications Reviewed) Any new allergies since your discharge?: No Dietary orders reviewed?: Yes Do you have support at home?: Yes  Home Care and Equipment/Supplies: [place] Ordered?: Yes Name of [facility]:: [facility] Has Agency set up a time to come to your home?: Yes [place] Visit Date: 12/09/22 Any new equipment or medical supplies ordered?: Yes Name of Medical supply agency?: [facility] Were you able to get the equipment/medical supplies?: Yes Do you have any questions related to the use of the equipment/supplies?: No  Functional Questionnaire: Do you need assistance with bathing/showering or dressing?: No Do you need assistance with meal preparation?: No Do you need assistance with eating?: No Do you have difficulty maintaining continence: No Do you need assistance with getting out of bed/getting out of a chair/moving?: No  Folllow up appointments reviewed: PCP Follow-up appointment confirmed?: [facility] Follow-up appointment confirmed?: Yes Date of Specialist follow-up appointment?: 01/03/23 Follow-Up Specialty Provider:: Dr Lalla Brothers Do you need transportation to your follow-up appointment?: No Do you understand care options if your condition(s) worsen?: Yes-patient verbalized understanding    [place], [place]  Nurse Health Advisor Direct Dial [phone]

## 2022-12-10 ENCOUNTER — Ambulatory Visit
Admission: RE | Admit: 2022-12-10 | Discharge: 2022-12-10 | Disposition: A | Discharge status: Home / Self Care | Payer: Commercial Managed Care - PPO | Source: Ambulatory Visit | Attending: Surgery | Admitting: Surgery

## 2022-12-10 ENCOUNTER — Other Ambulatory Visit: Payer: Self-pay | Admitting: Surgery

## 2022-12-10 ENCOUNTER — Other Ambulatory Visit (HOSPITAL_BASED_OUTPATIENT_CLINIC_OR_DEPARTMENT_OTHER): Payer: Self-pay

## 2022-12-10 DIAGNOSIS — K219 Gastro-esophageal reflux disease without esophagitis: Secondary | ICD-10-CM

## 2022-12-10 DIAGNOSIS — Z9884 Bariatric surgery status: Secondary | ICD-10-CM

## 2022-12-10 MED ORDER — ONDANSETRON 4 MG PO TBDP
4.0000 mg | ORAL_TABLET | Freq: Four times a day (QID) | ORAL | 0 refills | Status: DC | PRN
  Filled 2022-12-10: qty 20, 5d supply, fill #0

## 2022-12-11 ENCOUNTER — Other Ambulatory Visit (HOSPITAL_BASED_OUTPATIENT_CLINIC_OR_DEPARTMENT_OTHER): Payer: Self-pay

## 2022-12-12 ENCOUNTER — Encounter: Payer: Self-pay | Admitting: Skilled Nursing Facility1

## 2022-12-12 ENCOUNTER — Encounter: Payer: Commercial Managed Care - PPO | Attending: Surgery | Admitting: Skilled Nursing Facility1

## 2022-12-12 ENCOUNTER — Other Ambulatory Visit (HOSPITAL_BASED_OUTPATIENT_CLINIC_OR_DEPARTMENT_OTHER): Payer: Self-pay

## 2022-12-12 VITALS — Ht 62.0 in | Wt 276.2 lb

## 2022-12-12 DIAGNOSIS — E669 Obesity, unspecified: Secondary | ICD-10-CM

## 2022-12-12 DIAGNOSIS — E1165 Type 2 diabetes mellitus with hyperglycemia: Secondary | ICD-10-CM | POA: Insufficient documentation

## 2022-12-12 MED ORDER — OXYCODONE-ACETAMINOPHEN 5-325 MG PO TABS
1.0000 | ORAL_TABLET | Freq: Four times a day (QID) | ORAL | 0 refills | Status: DC | PRN
  Filled 2022-12-12: qty 20, 5d supply, fill #0

## 2022-12-12 MED ORDER — METHOCARBAMOL 500 MG PO TABS
500.0000 mg | ORAL_TABLET | Freq: Four times a day (QID) | ORAL | 0 refills | Status: DC
  Filled 2022-12-12: qty 40, 10d supply, fill #0

## 2022-12-12 NOTE — Progress Notes (Signed)
Bariatric Nutrition Follow-Up Visit Medical Nutrition Therapy   Surgery date: 09/30/2022 Surgery type: RYGB  Anthropometrics  Start weight at [facility]: 308.0 lbs (date: 04/29/2022)  Height: 62 in Weight today: 276.2 pounds   Clinical  Medical hx: Obesity, hyperlipidemia, HTN, hiatal hernia, T2DM, depression, arthritis, GERD  Medications:  lisinopril, Trulicity: taken off, escitalopram, ondansetron, hydroxyzine, lovastatin, glipizide Labs: A1C 9.4; HDL 56;  Notable signs/symptoms: none noted Any previous deficiencies? No Bowel Habits: Every day to every other day no complaints  Body Composition Scale 12/12/2022  Weight  lbs 276.2  Total Body Fat  % 48.6     Visceral Fat 20  Fat-Free Mass  % 51.3     Total Body Water  % 40.1     Muscle-Mass  lbs 30.6  BMI 50.4  Body Fat Displacement ---        Torso  lbs 83.3        Left Leg  lbs 16.6        Right Leg  lbs 6.6        Left Arm  lbs 8.3        Right Arm  lbs 8.3    Lifestyle & Dietary Hx  Pt states she checks her blood sugar every morning: 170', 180's with the highest being 186 due to numbers being over 150 pt states her endocrinologist started her on glipizide.   Pt states after surgery she was doing good for about a month even going back to work. Pt states with the hernia repair she had "bubbles" when drinking but that stopped but then she started having pain and nausea with the pain in her top right of her abdomen and constant nausea stating food was going fine for about 2 weeks.  Pain and nausea started before starting solids with the only thing staying down was seafood but then eventually not even seafood would stay down.   Pt states she now has a TF placed in her remnant. Pt states she has no problems with the tube feed but orally when she tries foods it hurts stating the pain is not lasting as long now lasting about 20 minutes .  Pt states as long as it is not cold she can tolerate fluid expect the protein shakes making  her feel nauseous but no pain (premier protein and Boost)  Pt states she tries 2 solids a day.   Went well: 4 medium sized shrimp Edamame: eating about 2-3 times a week unknown how much at once  Okra  Does not go well: Egg Beans were ok but still with some pain Mashed potato + canned salmon gave her pain for about 20 minutes    TF Home recommendations: -1/2 carton (120 ml) Osmolite 1.2 (has been doing 3 times a day) via G-tube -60 ml Prosource TF 20 TID (or equivalent): doing this -30 ml free water before and after each feeding (240 ml daily) -Provides 810 kcals, 86g protein, 75g CHO and 630 ml H2O   PO:   -PO to provide additional kcals, protein and fluid -Continue supplement as tolerated -Continue bariatric MVI: take at the same time 10 minutes in between take it later in the day and calcium supplement regimen once home   Recall: -TF -40 ounces gatorade zero -33 ounces water with ice -tea which is subtracted from the fluid above  -trying 2 solid foods per day unknown how much   Estimated daily fluid intake: 19 fluid ounces from TF and about 70 ounces PO  Estimated daily protein intake: 80 g Supplements: multi and calcium Current average weekly physical activity: some short walks here and there    Post-Op Goals/ Signs/ Symptoms Using straws: no Drinking while eating: no Chewing/swallowing difficulties: no Changes in vision: no Changes to mood/headaches: no Hair loss/changes to skin/nails: yes Difficulty focusing/concentrating: no Sweating: no Limb weakness: no Dizziness/lightheadedness: no Palpitations: no  Carbonated/caffeinated beverages: no N/V/D/C/Gas: bowel movement every other day Abdominal pain: no Dumping syndrome: no    NUTRITION DIAGNOSIS  Overweight/obesity ([place]-3.3) related to past poor dietary habits and physical inactivity as evidenced by completed bariatric surgery and following dietary guidelines for continued weight loss and healthy  nutrition status.     NUTRITION INTERVENTION Nutrition counseling (C-1) and education (E-2) to facilitate bariatric surgery goals, including: -The eventual goal is to transition off of the TF: currently you are not meeting a minimum of 70-75% of your caloric/protein goals orally so the need for the TF is still occurring; this process cannot be rushed in order to mitigate any malnutrition risks      Goals: Try tofu Try Now protein Soy isolate  Start logging everything you eat and drink with specific amounts  Try foods one at a time; do not have more than one food within an hour Try canned green beans  Try some no sugar added applesauce  Multivitamin: take at the same time 10 minutes in between take it later in the day  or less of a banana  Go for a 10 minute walk each day increasing as able but remember DO NOT OVER DO IT Try grits  Osmolite:  carton 4 times a day Prosource 3 times a day 60 ml 30 ml flushes of plain water before and after each feeding

## 2022-12-13 ENCOUNTER — Other Ambulatory Visit (HOSPITAL_BASED_OUTPATIENT_CLINIC_OR_DEPARTMENT_OTHER): Payer: Self-pay

## 2022-12-17 ENCOUNTER — Other Ambulatory Visit: Payer: Self-pay | Admitting: Family

## 2022-12-17 ENCOUNTER — Other Ambulatory Visit (HOSPITAL_BASED_OUTPATIENT_CLINIC_OR_DEPARTMENT_OTHER): Payer: Self-pay

## 2022-12-17 ENCOUNTER — Ambulatory Visit: Payer: Commercial Managed Care - PPO | Admitting: Dietician

## 2022-12-18 ENCOUNTER — Other Ambulatory Visit (HOSPITAL_BASED_OUTPATIENT_CLINIC_OR_DEPARTMENT_OTHER): Payer: Self-pay

## 2022-12-18 ENCOUNTER — Other Ambulatory Visit: Payer: Self-pay

## 2022-12-18 MED ORDER — ESCITALOPRAM OXALATE 10 MG PO TABS
15.0000 mg | ORAL_TABLET | Freq: Every day | ORAL | 1 refills | Status: DC
  Filled 2022-12-18: qty 45, 30d supply, fill #0
  Filled 2023-02-19: qty 45, 30d supply, fill #1

## 2022-12-19 ENCOUNTER — Other Ambulatory Visit (HOSPITAL_BASED_OUTPATIENT_CLINIC_OR_DEPARTMENT_OTHER): Payer: Self-pay

## 2022-12-19 MED ORDER — ONDANSETRON 4 MG PO TBDP
4.0000 mg | ORAL_TABLET | Freq: Four times a day (QID) | ORAL | 0 refills | Status: DC | PRN
  Filled 2022-12-19: qty 20, 5d supply, fill #0

## 2022-12-20 ENCOUNTER — Other Ambulatory Visit (HOSPITAL_BASED_OUTPATIENT_CLINIC_OR_DEPARTMENT_OTHER): Payer: Self-pay

## 2022-12-23 ENCOUNTER — Other Ambulatory Visit (HOSPITAL_COMMUNITY): Payer: Self-pay

## 2022-12-23 ENCOUNTER — Other Ambulatory Visit (HOSPITAL_BASED_OUTPATIENT_CLINIC_OR_DEPARTMENT_OTHER): Payer: Self-pay

## 2022-12-23 MED ORDER — GLIPIZIDE ER 5 MG PO TB24
5.0000 mg | ORAL_TABLET | Freq: Every morning | ORAL | 3 refills | Status: DC
  Filled 2022-12-23: qty 90, 90d supply, fill #0

## 2022-12-25 ENCOUNTER — Other Ambulatory Visit (HOSPITAL_BASED_OUTPATIENT_CLINIC_OR_DEPARTMENT_OTHER): Payer: Self-pay

## 2022-12-26 ENCOUNTER — Other Ambulatory Visit (HOSPITAL_BASED_OUTPATIENT_CLINIC_OR_DEPARTMENT_OTHER): Payer: Self-pay

## 2022-12-26 MED ORDER — ONDANSETRON 4 MG PO TBDP
4.0000 mg | ORAL_TABLET | Freq: Four times a day (QID) | ORAL | 0 refills | Status: DC | PRN
  Filled 2022-12-26: qty 20, 5d supply, fill #0

## 2022-12-26 MED ORDER — METHOCARBAMOL 500 MG PO TABS
500.0000 mg | ORAL_TABLET | Freq: Four times a day (QID) | ORAL | 0 refills | Status: DC
  Filled 2022-12-26: qty 40, 10d supply, fill #0

## 2022-12-27 ENCOUNTER — Other Ambulatory Visit (HOSPITAL_BASED_OUTPATIENT_CLINIC_OR_DEPARTMENT_OTHER): Payer: Self-pay

## 2022-12-30 ENCOUNTER — Other Ambulatory Visit (HOSPITAL_BASED_OUTPATIENT_CLINIC_OR_DEPARTMENT_OTHER): Payer: Self-pay

## 2022-12-30 ENCOUNTER — Ambulatory Visit: Payer: Commercial Managed Care - PPO | Admitting: Obstetrics & Gynecology

## 2022-12-30 ENCOUNTER — Encounter: Payer: Self-pay | Admitting: Obstetrics & Gynecology

## 2022-12-30 VITALS — BP 131/86 | HR 90 | Resp 16 | Ht 63.0 in | Wt 275.0 lb

## 2022-12-30 DIAGNOSIS — Z3046 Encounter for surveillance of implantable subdermal contraceptive: Secondary | ICD-10-CM | POA: Diagnosis not present

## 2022-12-30 DIAGNOSIS — Z30430 Encounter for insertion of intrauterine contraceptive device: Secondary | ICD-10-CM

## 2022-12-30 DIAGNOSIS — Z3009 Encounter for other general counseling and advice on contraception: Secondary | ICD-10-CM

## 2022-12-30 MED ORDER — SULFAMETHOXAZOLE-TRIMETHOPRIM 800-160 MG PO TABS
1.0000 | ORAL_TABLET | Freq: Two times a day (BID) | ORAL | 0 refills | Status: DC
  Filled 2022-12-30: qty 10, 5d supply, fill #0

## 2022-12-30 MED ORDER — LEVONORGESTREL 20 MCG/DAY IU IUD
1.0000 | INTRAUTERINE_SYSTEM | Freq: Once | INTRAUTERINE | Status: AC
  Administered 2022-12-30: 1 via INTRAUTERINE

## 2022-12-30 NOTE — Progress Notes (Unsigned)
   Subjective:    Patient ID: Elizabeth [name], female    DOB: 1976-01-17, 47 y.o.   MRN: [id number]  HPI  47 yo female presents for Nexplanon removal and IUD insertion.   Pt recently had [name] of [place] to find the [place].  She lost 35 pounds and I can barely feel the tip today.   Review of Systems  Constitutional:  Positive for appetite change.  Genitourinary: Negative.        Objective:   Physical Exam        Assessment & Plan:    Nexplanon Removal Patient was given informed consent for removal of her Nexplanon.  Appropriate time out taken. Nexplanon site identified.  Area prepped in usual sterile fashon. One ml of 1% lidocaine was used to anesthetize the area at the distal end of the implant. A small stab incision was made right beside the implant on the distal portion.  The Nexplanon rod was grasped using hemostats and removed without difficulty.  There was minimal blood loss. There were no complications.  A small amount of antibiotic ointment and steri-strips were applied over the small incision.  A pressure bandage was applied to reduce any bruising.  The patient tolerated the procedure well and was given post procedure instructions.  Patient is planning to use IU for contraception/attempt conception.   IUD Procedure Note Patient identified, informed consent performed.  Discussed risks of irregular bleeding, cramping, infection, malpositioning or misplacement of the IUD outside the uterus which may require further procedures. Time out was performed.    Speculum placed in the vagina.  Cervix visualized.  Cleaned with Betadine x 2.  Grasped anteriorly with a single tooth tenaculum.  Uterus sounded to 9 cm.  Mirena IUD placed per manufacturer's recommendations.  Strings trimmed to 3 cm. Tenaculum was removed, good hemostasis noted.  Patient tolerated procedure well.   Patient was given post-procedure instructions and the Mirena care card with expiration date.  Patient was also asked  to check IUD strings periodically and follow up in 4-6 weeks for IUD check.

## 2023-01-01 ENCOUNTER — Other Ambulatory Visit: Payer: Self-pay

## 2023-01-01 ENCOUNTER — Encounter: Payer: Medicaid Other | Attending: Surgery | Admitting: Skilled Nursing Facility1

## 2023-01-01 ENCOUNTER — Encounter: Payer: Self-pay | Admitting: Skilled Nursing Facility1

## 2023-01-01 VITALS — Ht 62.0 in | Wt 271.5 lb

## 2023-01-01 DIAGNOSIS — Z6841 Body Mass Index (BMI) 40.0 and over, adult: Secondary | ICD-10-CM | POA: Insufficient documentation

## 2023-01-01 DIAGNOSIS — E669 Obesity, unspecified: Secondary | ICD-10-CM

## 2023-01-01 DIAGNOSIS — Z01818 Encounter for other preprocedural examination: Secondary | ICD-10-CM | POA: Insufficient documentation

## 2023-01-01 DIAGNOSIS — E1165 Type 2 diabetes mellitus with hyperglycemia: Secondary | ICD-10-CM

## 2023-01-01 NOTE — Progress Notes (Signed)
Bariatric Nutrition Follow-Up Visit Medical Nutrition Therapy   Surgery date: 09/30/2022 Surgery type: RYGB  Anthropometrics  Start weight at [facility]: 308.0 lbs (date: 04/29/2022)  Height: 62 in Weight today: 271.5 pounds   Clinical  Medical hx: Obesity, hyperlipidemia, HTN, hiatal hernia, T2DM, depression, arthritis, GERD  Medications:  lisinopril, Trulicity: taken off, escitalopram, ondansetron, hydroxyzine, lovastatin, glipizide Labs: A1C 9.4; HDL 56;  Notable signs/symptoms: none noted Any previous deficiencies? No Bowel Habits: Every day to every other day no complaints  Body Composition Scale 12/12/2022 01/01/2023  Weight  lbs 276.2 271.5  Total Body Fat  % 48.6 48.2     Visceral Fat 20 19  Fat-Free Mass  % 51.3 51.7     Total Body Water  % 40.1 40.3     Muscle-Mass  lbs 30.6 30.6  BMI 50.4 49.5  Body Fat Displacement ---         Torso  lbs 83.3 81.2        Left Leg  lbs 16.6 16.2        Right Leg  lbs 6.6 16.2        Left Arm  lbs 8.3 8.1        Right Arm  lbs 8.3 8.1    Lifestyle & Dietary Hx  Primary objective: Once the patient demonstrates sufficient ability to meet their needs PO, the dosage of the TF supplement will be gradually decreased. Patient to prioritize diligent logging to ensure needs are being met.  Pt states she checks her blood sugar every morning: 140-150.   Pt states she logged everything she eats and drinks but accidentally left it at home. Pt states her son takes care of her and keeps her motivated (45 years old). Pt states she goes to the GI doctor tomorrow.  Pt states she does not have good energy getting fatigued easily washing dishes or other housework. Pt states walking is okay though walking about 10-15 minutes sometimes taking 2 a day.  Pt states her surgeon advised her the TF will be kept for another 6 months as this progression is slow.   Pt states she tries to eat the solids around 2-3 and 4. Pt states she never tries solids after 6  stating she is super tired and needs to relax.  Pt states sometimes she has no appetite.   Pt states she stays nauseous.   Pt states she tries 1-2 solids a day.   Went well: 4 medium sized shrimp Edamame (now making her vomit when previous it was okay) Okra White fish (3 ounces) Grits salt and pepper + Soy protein isolate Mashed Potatoes salt and pepper or baked potato slat and pepper White Rice salt and pepper Tofu sauteed In oil Green beans Raisin Bran in milk No sugar added applesauce + Soy protein isolate  Does not go well: Egg Beans were ok but still with some pain Mashed potato + canned salmon gave her pain for about 20 minutes  Edamame Cheese Canned greens   TF Home recommendations: -1/2 carton (120 ml) Osmolite 1.2 (has been doing 3 times a day) via G-tube -60 ml Prosource TF 20 TID (or equivalent): doing this -30 ml free water before and after each feeding (240 ml daily) -Provides 810 kcals, 86g protein, 75g CHO and 630 ml H2O   PO:   -PO to provide additional kcals, protein and fluid -Continue supplement as tolerated -Continue bariatric MVI: take at the same time 10 minutes in between take it later in  the day and calcium supplement regimen once home   Recall: -TF -20 ounces gatorade zero -40 water + Lavana light  -20 diet green tea -16 ounces water with ice -tea which is subtracted from the fluid above  -trying 2 solid foods per day unknown how much   Estimated daily fluid intake: 19 fluid ounces from TF and about 70 ounces PO Estimated daily protein intake: 80 g Supplements: multi and calcium Current average weekly physical activity: some short walks here and there    Post-Op Goals/ Signs/ Symptoms Using straws: no Drinking while eating: no Chewing/swallowing difficulties: no Changes in vision: no Changes to mood/headaches: no Hair loss/changes to skin/nails: yes Difficulty focusing/concentrating: no Sweating: no Limb weakness:  no Dizziness/lightheadedness: no Palpitations: no  Carbonated/caffeinated beverages: no N/V/D/C/Gas: bowel movement every other day Abdominal pain: no Dumping syndrome: no    NUTRITION DIAGNOSIS  Overweight/obesity ([place]-3.3) related to past poor dietary habits and physical inactivity as evidenced by completed bariatric surgery and following dietary guidelines for continued weight loss and healthy nutrition status.     NUTRITION INTERVENTION Nutrition counseling (C-1) and education (E-2) to facilitate bariatric surgery goals, including: -The eventual goal is to transition off of the TF: currently you are not meeting a minimum of 70-75% of your caloric/protein goals orally so the need for the TF is still occurring; this process cannot be rushed in order to mitigate any malnutrition risks      Goals: Try tofu Try Now protein Soy isolate  Start logging everything you eat and drink with specific amounts  Try foods one at a time; do not have more than one food within an hour Try canned green beans  Try some no sugar added applesauce  Multivitamin: take at the same time 10 minutes in between take it later in the day Continue:  or less of a banana  Go for a 10 minute walk each day increasing as able but remember DO NOT OVER DO IT Try grits  Osmolite:  carton 4 times a day Prosource 3 times a day 60 ml 30 ml flushes of plain water before and after each feeding   NEW Goals:  Try baked tofu Try grilled squash  Cut back PO liquids by 20 ounces and see if that allows more room for food Continue to add protein powder to whatever food makes sense to you  Log the amounts you are able to eat

## 2023-01-02 ENCOUNTER — Ambulatory Visit (INDEPENDENT_AMBULATORY_CARE_PROVIDER_SITE_OTHER): Payer: Medicaid Other | Admitting: Physician Assistant

## 2023-01-02 ENCOUNTER — Encounter: Payer: Self-pay | Admitting: Physician Assistant

## 2023-01-02 VITALS — BP 110/68 | HR 89 | Ht 62.0 in | Wt 272.0 lb

## 2023-01-02 DIAGNOSIS — R1013 Epigastric pain: Secondary | ICD-10-CM | POA: Diagnosis not present

## 2023-01-02 DIAGNOSIS — Z9884 Bariatric surgery status: Secondary | ICD-10-CM | POA: Diagnosis not present

## 2023-01-02 DIAGNOSIS — R112 Nausea with vomiting, unspecified: Secondary | ICD-10-CM

## 2023-01-02 NOTE — Patient Instructions (Signed)
OMEPRAZOLE: please open capsule and take with applesauce or yogurt twice daily 30 minutes before meals. Carafate: take 1 mg and dissolve into water, take twice daily  Continue Zofran: every 6 hours as needed.  You have been scheduled for a gastric emptying scan at [facility] on 01/21/2023 at 7:30am. Please arrive at least 30 minutes prior to your appointment for registration. Please make certain not to have anything to eat or drink after midnight the night before your test. Hold all stomach medications (ex: Zofran, phenergan, Reglan) 24 hours prior to your test. If you need to reschedule your appointment, please contact radiology scheduling at [phone].  Please do not do any tube feedings 6 hours prior to the test. _____________________________________________________________________ A gastric-emptying study measures how long it takes for food to move through your stomach. There are several ways to measure stomach emptying. In the most common test, you eat food that contains a small amount of radioactive material. A scanner that detects the movement of the radioactive material is placed over your abdomen to monitor the rate at which food leaves your stomach. This test normally takes about 4 hours to complete. _____________________________________________________________________   _______________________________________________________  If your blood pressure at your visit was 140/90 or greater, please contact your primary care physician to follow up on this.  _______________________________________________________  If you are age 45 or older, your body mass index should be between 23-30. Your Body mass index is 49.75 kg/m. If this is out of the aforementioned range listed, please consider follow up with your Primary Care Provider.  If you are age 43 or younger, your body mass index should be between 19-25. Your Body mass index is 49.75 kg/m. If this is out of the aformentioned  range listed, please consider follow up with your Primary Care Provider.   ________________________________________________________  The [facility] providers would like to encourage you to use [facility] to communicate with providers for non-urgent requests or questions.  Due to long hold times on the telephone, sending your provider a message by [facility] may be a faster and more efficient way to get a response.  Please allow 48 business hours for a response.  Please remember that this is for non-urgent requests.  _______________________________________________________ It was a pleasure to see you today!  Thank you for trusting me with your gastrointestinal care!

## 2023-01-02 NOTE — Progress Notes (Signed)
Subjective:    Patient ID: Elizabeth [name], female    DOB: 1976-02-15, 47 y.o.   MRN: [id number]  HPI Avelin is a pleasant 47 year old African-American female established with Dr. Bryan Lemma, who was last seen here in the fall 2022.  She has history of GERD with small hiatal hernia and had undergone laparoscopic cholecystectomy in 2022.  She had undergone workup for complaints of GERD, nausea and right upper quadrant pain in 2022.  She is also known to have hepatic steatosis. She underwent Roux-en-Y gastric bypass and hiatal hernia repair with Dr. [name] on 09/30/2022.  Patient says that initially she did fine post bypass surgery but within a month she started having problems with abdominal pain nausea and vomiting to the point of not being able to keep down any p.o.'s.  She had been on Trulicity which was discontinued.  Since that time she has had fairly extensive workup per surgery including CT of the abdomen and pelvis which was done on 11/15/2022 which did show hepatomegaly, status postcholecystectomy, no evidence of ductal dilation, status post gastric bypass and otherwise unremarkable.  CT angio was done of the chest in early February 2024 due to complaints of epigastric and back pain this did reveal revealed pulmonary nodules and hepatic steatosis as well as a 2 cm right ovarian cyst.  Upper GI was done on 12/10/2022 unremarkable and no GERD noted.  She then underwent a repeat surgical procedure on 12/02/2022 with EGD done during surgery and read as unremarkable, then diagnostic laparoscopy with lysis of adhesions, and then placement of a feeding tube into the gastric remnant as patient was felt to be malnourished. She is currently on 4 cans of Osmolite daily and 3 Prosource daily and seems to be tolerating the tube feedings without difficulty and says that she can instill the tube feedings and not have abdominal pain.  She continues to feel nauseated most of the day though is not vomiting and is  starting to eat a very soft bland diet including grits mashed potatoes etc.  She is being followed by a nutritionist.  She has lost a total of 42 pounds since surgery and says she is continuing to slowly lose weight. She is currently on omeprazole 40 mg p.o. twice daily and Carafate 1 g 4 times daily generally using Zofran at least once daily. She complains of epigastric pain and epigastric and pain into the right upper abdomen postprandially that usually starts within 30 seconds of eating and may last for an hour, this is not severe at this point and she has been able to get off of oral narcotics. She feels that she is making very slow progress, and relates that Dr. [name] wanted GI opinion regarding any further advice.  Review of Systems Pertinent positive and negative review of systems were noted in the above HPI section.  All other review of systems was otherwise negative.   Outpatient Encounter Medications as of 01/02/2023  Medication Sig   Blood Glucose Monitoring Suppl (FREESTYLE LITE) w/Device KIT Use to check blood sugar 4 times a day as directed   CALCIUM-VITAMIN D PO Take 2 tablets by mouth every evening.   escitalopram (LEXAPRO) 10 MG tablet Take 1.5 tablets (15 mg total) by mouth daily.   glipiZIDE (GLIPIZIDE XL) 5 MG 24 hr tablet Take 1 tablet (5 mg total) by mouth every morning with food.   glucose blood (FREESTYLE LITE) test strip Use to check blood sugar 4 times a day as directed   hydrOXYzine (  ATARAX) 25 MG tablet Take 2 tablets by mouth at bedtime as needed for insomnia.   Lancets (FREESTYLE) lancets Use to check blood sugar 4 times a day as directed   lisinopril (ZESTRIL) 5 MG tablet Take 1 tablet (5 mg total) by mouth daily.   lovastatin (MEVACOR) 40 MG tablet TAKE 1 TABLET BY MOUTH AT BEDTIME.   methocarbamol (ROBAXIN) 500 MG tablet Take 1 tablet (500 mg total) by mouth 4 (four) times daily for 10 days.   Multiple Vitamin (MULTIVITAMIN WITH MINERALS) TABS tablet Take 2  tablets by mouth every evening.   omeprazole (PRILOSEC) 40 MG capsule Take 1 capsule (40 mg total) by mouth in the morning and at bedtime.   ondansetron (ZOFRAN-ODT) 4 MG disintegrating tablet Take 1 tablet (4 mg total) by mouth every 6 (six) hours as needed.   sucralfate (CARAFATE) 1 g tablet Take 1 tablet (1 g total) by mouth 4 (four) times daily before meals and nightly.   sulfamethoxazole-trimethoprim (BACTRIM DS) 800-160 MG tablet Take 1 tablet by mouth 2 (two) times daily.   methocarbamol (ROBAXIN-750) 750 MG tablet Take 1 tablet (750 mg total) by mouth 4 (four) times daily. (Patient not taking: Reported on 01/02/2023)   oxyCODONE-acetaminophen (PERCOCET/ROXICET) 5-325 MG tablet Take 1 tablet by mouth every 6 (six) hours as needed for pain, for up to 5 days. (Patient not taking: Reported on 01/02/2023)   [DISCONTINUED] traZODone (DESYREL) 50 MG tablet 1/2 to 1 tab by mouth at bedtime as needed for sleep   Facility-Administered Encounter Medications as of 01/02/2023  Medication   sodium chloride 0.9 % 1,000 mL with thiamine 123XX123 mg, folic acid 1 mg, M.V.I. Adult 10 mL infusion   sodium chloride 0.9 % 1,000 mL with thiamine 123XX123 mg, folic acid 1 mg, M.V.I. Adult 10 mL infusion   sodium chloride 0.9 % bolus 1,000 mL   sodium chloride 0.9 % bolus 1,000 mL   Allergies  Allergen Reactions   Tomato Hives, Itching and Rash   Patient Active Problem List   Diagnosis Date Noted   History of gastric bypass 12/02/2022   Positive ANA (antinuclear antibody) 11/26/2022   Excessive bleeding in premenopausal period 11/11/2022   Osteoarthritis of knee 03/19/2022   Left knee pain 02/22/2022   Preventative health care 12/31/2021   Drug-induced constipation 12/31/2021   Depression 11/07/2021   Chronic abdominal pain 04/06/2021   Grief reaction 04/06/2021   Gastroesophageal reflux disease without esophagitis    Hyperlipidemia 01/21/2018   Uncontrolled type 2 diabetes mellitus with hyperglycemia ([place])  01/21/2018   Keratoconjunctivitis sicca of both eyes not specified as Sjogren's 04/04/2017   Regular astigmatism of both eyes 04/04/2017   Vitamin D deficiency 12/02/2016   Essential hypertension 02/07/2016   Morbid obesity with BMI of 50.0-59.9, adult ([place]) 02/07/2016   Social History   Socioeconomic History   Marital status: Widowed    Spouse name: [name]   Number of children: 2   Years of education: Not on file   Highest education level: Not on file  Occupational History   Occupation: [employer]    Employer: [employer]  Tobacco Use   Smoking status: Former    Packs/day: 0.50    Years: 3.00    Additional pack years: 0.00    Total pack years: 1.50    Types: Cigarettes    Quit date: 08/21/2021    Years since quitting: 1.3   Smokeless tobacco: Never   Tobacco comments:    4-5/day  Vaping Use   Vaping Use: Never used  Substance and Sexual Activity   Alcohol use: No   Drug use: No   Sexual activity: Yes    Partners: Male    Birth control/protection: Surgical, Implant  Other Topics Concern   Not on file  Social History Narrative    2 children   78- son Allen Kell   2000- son Insurance claims handler in [place] in [place]   Enjoys reading   Widowed 07-21-2023, husband died from COVID-40.   Social Determinants of Health   Financial Resource Strain: Not on file  Food Insecurity: No Food Insecurity (12/02/2022)   Hunger Vital Sign    Worried About Running Out of Food in the Last Year: Never true    Ran Out of Food in the Last Year: Never true  Transportation Needs: No Transportation Needs (12/02/2022)   PRAPARE - Hydrologist (Medical): No    Lack of Transportation (Non-Medical): No  Physical Activity: Not on file  Stress: Not on file  Social Connections: Not on file  Intimate Partner Violence: Not At Risk (12/02/2022)   Humiliation, Afraid, Rape, and Kick questionnaire    Fear of Current or Ex-Partner: No     Emotionally Abused: No    Physically Abused: No    Sexually Abused: No    Ms. Massenburg's family history includes Alcohol abuse in her father; Diabetes in her mother; Drug abuse in her father; HIV in her father; Hyperlipidemia in her mother; Hypertension in her mother; Obesity in her mother; Ovarian cancer in her maternal grandmother; Peripheral Artery Disease in her mother.      Objective:    Vitals:   01/02/23 1448  BP: 110/68  Pulse: 89    Physical Exam. Well-developed well-nourished AA female in no acute distress.  Height, Weight, 272 BMI 49.7  HEENT; nontraumatic normocephalic, EOMI, PE R LA, sclera anicteric. Oropharynx; not examined today Neck; supple, no JVD Cardiovascular; regular rate and rhythm with S1-S2, no murmur rub or gallop Pulmonary; Clear bilaterally Abdomen; soft, morbidly obese, nondistended, no palpable mass or hepatosplenomegaly, bowel sounds are active gastrostomy tube is present in the epigastrium, some mild tenderness in the epigastrium and right upper quadrant, hypogastrium Rectal; not done today Skin; benign exam, no jaundice rash or appreciable lesions Extremities; no clubbing cyanosis or edema skin warm and dry Neuro/Psych; alert and oriented x4, grossly nonfocal mood and affect appropriate        Assessment & Plan:   #76 47 year old female with morbid obesity, status post Roux-en-Y gastric bypass and hiatal hernia repair 09/30/2022.  Course complicated by postoperative development of abdominal pain in the upper abdomen and persistent nausea and vomiting with inability to keep down p.o.'s.  She has had extensive workup as outlined above and this culminated in an exploratory lap, EGD and placement of gastrostomy tube into the gastric remnant on 12/02/2022 due to concerns for malnutrition and persistent symptoms.  EGD felt to be unremarkable per surgeon. She did have lysis of adhesions  She is now tolerating tube feedings without difficulty, has had  some gradual improvement though she remains nauseated most of each day she is not vomiting and has been able to start some very soft bland foods orally in addition to tolerating tube feeds as outlined above. Continues to complain of fairly immediate postprandial abdominal pain only with food intake, not with tube feedings.  Etiology of her symptoms at this time is not entirely clear, interesting  that she had been on Trulicity which has now been discontinued.  This may have contributed to postoperative GI symptoms i.e. gastroparesis etc. Wonder if she is still having some component of gastroparesis type symptoms, rule out gastropathy, no evidence of Roux dysfunction by recent imaging  #2 history of chronic GERD #3.  Adult onset diabetes mellitus #4.  Hypertension #5.  Status post laparoscopic cholecystectomy 2022 #6 pulmonary nodules will need follow-up by PCP radiology had recommended repeat CT in 3 to 6 months  Plan; discontinue Trulicity patient advised to stay off Trulicity Continue tube feedings as outlined above and gradual advancement of oral intake as tolerated over the next few weeks under the care of of nutritionist. Will change omeprazole to 40 mg omeprazole open capsule and sprinkle on applesauce and yogurt and take twice daily 30 minutes AC for better absorption post Roux-en-Y. Will continue Carafate for another month, advised patient to make this into a slurry with 5 to 10 cc of water and take 4 times daily between meals and at bedtime again to improve absorption. Continue Zofran 4 mg every 6 hours as needed for nausea Schedule for gastric emptying scan Follow-up office visit with Dr. Bryan Lemma scheduled.    Frankey Botting Genia Harold PA-C 01/02/2023   Cc: [name], NP

## 2023-01-06 ENCOUNTER — Other Ambulatory Visit: Payer: Self-pay

## 2023-01-06 ENCOUNTER — Other Ambulatory Visit (HOSPITAL_BASED_OUTPATIENT_CLINIC_OR_DEPARTMENT_OTHER): Payer: Self-pay

## 2023-01-07 ENCOUNTER — Other Ambulatory Visit (HOSPITAL_BASED_OUTPATIENT_CLINIC_OR_DEPARTMENT_OTHER): Payer: Self-pay

## 2023-01-07 MED ORDER — METHOCARBAMOL 500 MG PO TABS
500.0000 mg | ORAL_TABLET | Freq: Four times a day (QID) | ORAL | 0 refills | Status: DC
  Filled 2023-01-07: qty 40, 10d supply, fill #0

## 2023-01-07 MED ORDER — ONDANSETRON 4 MG PO TBDP
4.0000 mg | ORAL_TABLET | Freq: Four times a day (QID) | ORAL | 0 refills | Status: DC | PRN
  Filled 2023-01-07: qty 20, 5d supply, fill #0

## 2023-01-13 NOTE — Progress Notes (Signed)
Agree with the assessment and plan as outlined by Amy Esterwood, PA-C.  [place]h Yeagley, DO, FACG  

## 2023-01-17 ENCOUNTER — Other Ambulatory Visit (HOSPITAL_BASED_OUTPATIENT_CLINIC_OR_DEPARTMENT_OTHER): Payer: Self-pay

## 2023-01-20 ENCOUNTER — Other Ambulatory Visit (HOSPITAL_BASED_OUTPATIENT_CLINIC_OR_DEPARTMENT_OTHER): Payer: Self-pay

## 2023-01-20 MED ORDER — METHOCARBAMOL 500 MG PO TABS
500.0000 mg | ORAL_TABLET | Freq: Four times a day (QID) | ORAL | 0 refills | Status: DC
  Filled 2023-01-20: qty 40, 10d supply, fill #0

## 2023-01-21 ENCOUNTER — Other Ambulatory Visit: Payer: Self-pay

## 2023-01-21 ENCOUNTER — Other Ambulatory Visit (HOSPITAL_BASED_OUTPATIENT_CLINIC_OR_DEPARTMENT_OTHER): Payer: Self-pay

## 2023-01-21 ENCOUNTER — Encounter (HOSPITAL_COMMUNITY)
Admission: RE | Admit: 2023-01-21 | Discharge: 2023-01-21 | Disposition: A | Discharge status: Home / Self Care | Payer: Medicaid Other | Source: Ambulatory Visit | Attending: Physician Assistant | Admitting: Physician Assistant

## 2023-01-21 DIAGNOSIS — R112 Nausea with vomiting, unspecified: Secondary | ICD-10-CM | POA: Insufficient documentation

## 2023-01-21 DIAGNOSIS — R1013 Epigastric pain: Secondary | ICD-10-CM | POA: Diagnosis present

## 2023-01-21 DIAGNOSIS — Z9884 Bariatric surgery status: Secondary | ICD-10-CM | POA: Insufficient documentation

## 2023-01-21 MED ORDER — ONDANSETRON 4 MG PO TBDP
4.0000 mg | ORAL_TABLET | Freq: Four times a day (QID) | ORAL | 0 refills | Status: DC | PRN
  Filled 2023-01-21: qty 20, 5d supply, fill #0

## 2023-01-21 MED ORDER — TECHNETIUM TC 99M SULFUR COLLOID
2.2000 | Freq: Once | INTRAVENOUS | Status: AC
  Administered 2023-01-21: 2.2 via ORAL

## 2023-01-22 ENCOUNTER — Telehealth: Payer: Self-pay | Admitting: Skilled Nursing Facility1

## 2023-01-22 NOTE — Telephone Encounter (Signed)
Called pt due to her emailed question. "Who orders the formula for my feeding tube? Is it Dr. Mikeal Hawthorne?  Thanks in advance."   Pt states she is running low on her formula and wants to know who to call. Dietitian advised she call [facility] and if the prescription ran out she can call her surgeon for an updated prescription.    Pt states she got a swallow study yesterday stating she does not know the results but had egg in her stomach when she left stating they advised she has gastroparesis.

## 2023-01-23 ENCOUNTER — Ambulatory Visit: Payer: Commercial Managed Care - PPO | Admitting: Skilled Nursing Facility1

## 2023-01-27 ENCOUNTER — Encounter: Payer: Medicaid Other | Attending: Surgery | Admitting: Skilled Nursing Facility1

## 2023-01-27 ENCOUNTER — Encounter: Payer: Self-pay | Admitting: Skilled Nursing Facility1

## 2023-01-27 VITALS — Ht 62.0 in | Wt 270.2 lb

## 2023-01-27 DIAGNOSIS — E1165 Type 2 diabetes mellitus with hyperglycemia: Secondary | ICD-10-CM | POA: Diagnosis present

## 2023-01-27 DIAGNOSIS — E669 Obesity, unspecified: Secondary | ICD-10-CM | POA: Diagnosis present

## 2023-01-27 NOTE — Progress Notes (Signed)
Bariatric Nutrition Follow-Up Visit Medical Nutrition Therapy   Surgery date: 09/30/2022 Surgery type: RYGB  Anthropometrics  Start weight at [facility]: 308.0 lbs (date: 04/29/2022)  Height: 62 in Weight today: 270.2 pounds   Clinical  Medical hx: Obesity, hyperlipidemia, HTN, hiatal hernia, T2DM, depression, arthritis, GERD  Medications:  lisinopril, Trulicity: taken off, escitalopram, ondansetron, hydroxyzine, lovastatin, glipizide Labs: A1C 9.4; HDL 56; RBC 5.25, MCV 75.6 Notable signs/symptoms: none noted Any previous deficiencies? No Bowel Habits: Every day to every other day no complaints  Body Composition Scale 12/12/2022 01/01/2023 01/27/2023  Weight  lbs 276.2 271.5 270.2  Total Body Fat  % 48.6 48.2 48.1     Visceral Fat 20 19 19   Fat-Free Mass  % 51.3 51.7 51.8     Total Body Water  % 40.1 40.3 40.4     Muscle-Mass  lbs 30.6 30.6 30.6  BMI 50.4 49.5 49.3  Body Fat Displacement ---          Torso  lbs 83.3 81.2 80.7        Left Leg  lbs 16.6 16.2 16.1        Right Leg  lbs 6.6 16.2 16.1        Left Arm  lbs 8.3 8.1 8.0        Right Arm  lbs 8.3 8.1 8.0    Lifestyle & Dietary Hx  Primary objective: Once the patient demonstrates sufficient ability to meet their needs PO, the dosage of the TF supplement will be gradually decreased. Patient to prioritize diligent logging to ensure needs are being met.   Pt states she has been measuring her meats with a food scale reaching 3 ounces at once.  Pt state she is not as tired as she was. Pt states she has been doing 2 10 minute walks 4-5 days a week and moving around the hours more now able to do the dishes and other house chores.   Pt states her blood sugars have been about 190 fasting.  Pt states she has been getting headaches.   Pt states she tries 2 solids a day (up from 1-2)   Went well: 4-5 medium sized shrimp Edamame (now making her vomit when previous it was okay) Pork neck bone 3 scallops Salmon  patty Okra White fish (3 ounces) Grits salt and pepper + Soy protein isolate Mashed Potatoes salt and pepper or baked potato slat and pepper White Rice salt and pepper Tofu sauteed In oil Green beans Raisin Bran in milk No sugar added applesauce + Soy protein isolate Can peaches 1/3 banana  Nuts Pinto beans Broccoli Pancake with protein Zero bushes baked beans  Peanu butter on whole wheat toast White rice  Does not go well: Egg Beans were ok but still with some pain Mashed potato + canned salmon gave her pain for about 20 minutes  Edamame Cheese Canned greens  Boiled egg Collards/turnips  TF Home recommendations: -1/2 carton (120 ml) Osmolite 1.2 (has been doing 3 times a day) via G-tube -60 ml Prosource TF 20 TID (or equivalent): doing this -30 ml free water before and after each feeding (240 ml daily) -Provides 810 kcals, 86g protein, 75g CHO and 630 ml H2O   PO:   -PO to provide additional kcals, protein and fluid -Continue supplement as tolerated -Continue bariatric MVI: take at the same time 10 minutes in between take it later in the day and calcium supplement regimen once home   Recall: -8am TF: 1/2 carton osmolite/30  ml before and after -9am pro-source 1 hour later/30 ml before and after -nap -1pm: neck bone + white rice + string beans -2:30: 1/2 carton osmolite/30 ml before and after -3:30: pro-source 1 hour later/30 ml before and after -5:30: 1/2 carton osmolite/30 ml before and after -8pm: prosource -10:30: 1/2 carton osmolite/30 ml before and after  Beverages: -20 ounces gatorade zero -40 oz  water + Meghanne light  -20 diet green tea -16 ounces water with ice -tea which is subtracted from the fluid above     Estimated daily fluid intake: 19 fluid ounces from TF and about 70 ounces PO Estimated daily protein intake: 80 g Supplements: multi and calcium Current average weekly physical activity: some short walks here and there    Post-Op  Goals/ Signs/ Symptoms Using straws: no Drinking while eating: no Chewing/swallowing difficulties: no Changes in vision: no Changes to mood/headaches: no Hair loss/changes to skin/nails: yes Difficulty focusing/concentrating: no Sweating: no Limb weakness: no Dizziness/lightheadedness: no Palpitations: no  Carbonated/caffeinated beverages: no N/V/D/C/Gas: bowel movement every other day Abdominal pain: no Dumping syndrome: no    NUTRITION DIAGNOSIS  Overweight/obesity (-3.3) related to past poor dietary habits and physical inactivity as evidenced by completed bariatric surgery and following dietary guidelines for continued weight loss and healthy nutrition status.     NUTRITION INTERVENTION Nutrition counseling (C-1) and education (E-2) to facilitate bariatric surgery goals, including: -The eventual goal is to transition off of the TF: currently you are not meeting a minimum of 70-75% of your caloric/protein goals orally so the need for the TF is still occurring; this process cannot be rushed in order to mitigate any malnutrition risks      Goals: Try tofu Try Now protein Soy isolate  Start logging everything you eat and drink with specific amounts  Try foods one at a time; do not have more than one food within an hour Try canned green beans  Try some no sugar added applesauce  Multivitamin: take at the same time 10 minutes in between take it later in the day Continue:  or less of a banana  Go for a 10 minute walk each day increasing as able but remember DO NOT OVER DO IT Try grits  Osmolite:  carton 4 times a day Prosource 3 times a day 60 ml 30 ml flushes of plain water before and after each feeding   NEW Goals:  Try baked tofu Try grilled squash  Cut back PO liquids by 20 ounces and see if that allows more room for food Continue to add protein powder to whatever food makes sense to you  Log the amounts you are able to eat  Newest goal: Try 3 solid events in one  day: this will replace one half carton of osmolite  Check blood sugar 2 hours after any meal

## 2023-01-28 ENCOUNTER — Telehealth: Payer: Self-pay

## 2023-01-28 NOTE — Telephone Encounter (Signed)
Amy Esterwood, PA is working in the hospital. Would you review and advise please?   Elizabeth [name]  [facility] (supporting Amy S Esterwood, PA-C)1 hour ago (7:51 AM)   Good morning I have am having a new pain that has been going on for a little over a week. It is a pain win my stomach above my stoma and on the sides of it. It also goes to my back. It is a constant pain like a dull ache that gets worse when I eat solid foods. I thought it was muscle pain but the muscle relaxer and Tylenol does not work on the pain and neither does the heat pad or cold pack. Do you have any suggestions? I have reached out to the surgeon office but I don't  know if they understand my pain.

## 2023-01-29 NOTE — Telephone Encounter (Signed)
Discussed with the patient, the recommendation from Dr Carloyn Manner. "Typically when the bumper is too tight it causes pressure on the outside (or inside of the gastric lining) and is not mobile 360 degrees.  However, as this was surgically placed, it may also be sewn on the inside and not fully mobile.  Another issue can also be when patients put gauze beneath the external bumper and that places undue stress.  It is better to place any gauze above the external bumper.   Overall, it is difficult to know source of pain without evaluation/examination.  Since we do not manage feeding tubes often in our clinic, best to have this evaluated in the surgical clinic.  With that said, happy to have an OV scheduled with me or one of the APP's to evaluate the PEG site as well." Patient expresses appreciation for the information. She will call her surgeon again and ask to be seen to evaluate. She will call us back if she has further issues.

## 2023-01-29 NOTE — Telephone Encounter (Signed)
Spoke with the patient. She is sorry she missed your call.  She said the stoma has always had some drainage, and it has not changed or increased. The tube can be manipulated without causing pain. She has constant pain at the stoma and this pain is worse when she eats any solid foods. She does not have a worsening of pain when she does the tube feeding. She spoke with the surgeon and was told she "has acid." Patient does not agree, and wonders if the bumper is too tight. Please advise.

## 2023-02-04 ENCOUNTER — Other Ambulatory Visit (HOSPITAL_COMMUNITY): Payer: Self-pay | Admitting: Student

## 2023-02-04 DIAGNOSIS — R1013 Epigastric pain: Secondary | ICD-10-CM

## 2023-02-07 ENCOUNTER — Telehealth (HOSPITAL_BASED_OUTPATIENT_CLINIC_OR_DEPARTMENT_OTHER): Payer: Self-pay

## 2023-02-07 ENCOUNTER — Ambulatory Visit: Payer: Commercial Managed Care - PPO | Admitting: Family

## 2023-02-08 ENCOUNTER — Ambulatory Visit (HOSPITAL_BASED_OUTPATIENT_CLINIC_OR_DEPARTMENT_OTHER)
Admission: RE | Admit: 2023-02-08 | Discharge: 2023-02-08 | Disposition: A | Discharge status: Home / Self Care | Payer: PRIVATE HEALTH INSURANCE | Source: Ambulatory Visit | Attending: Student | Admitting: Student

## 2023-02-08 DIAGNOSIS — R1013 Epigastric pain: Secondary | ICD-10-CM | POA: Diagnosis present

## 2023-02-08 MED ORDER — IOHEXOL 300 MG/ML  SOLN
100.0000 mL | Freq: Once | INTRAMUSCULAR | Status: AC | PRN
  Administered 2023-02-08: 100 mL via INTRAVENOUS

## 2023-02-10 ENCOUNTER — Ambulatory Visit: Payer: Commercial Managed Care - PPO | Admitting: Obstetrics & Gynecology

## 2023-02-10 ENCOUNTER — Telehealth: Payer: Self-pay | Admitting: Skilled Nursing Facility1

## 2023-02-10 NOTE — Telephone Encounter (Signed)
Called pt to check in on her PO intake.   LVM

## 2023-02-10 NOTE — Telephone Encounter (Signed)
Pt states she is in a lot of pain and has been for about 2 weeks now and feels no one is taking her pain seriously. Pt states her tube feed placement area hurts and inside of her stomach hurts all day regardless of putting anything in it and hurts worse when using it. Pt states she experience the same pain with PO intake stating she is tired of being in pain and is feeling frustrated.

## 2023-02-12 ENCOUNTER — Ambulatory Visit: Payer: Commercial Managed Care - PPO | Admitting: Family

## 2023-02-13 ENCOUNTER — Other Ambulatory Visit: Payer: Self-pay

## 2023-02-13 MED ORDER — ONDANSETRON 4 MG PO TBDP: 4.0000 mg | ORAL_TABLET | Freq: Four times a day (QID) | ORAL | 0 refills | Status: DC | PRN

## 2023-02-14 ENCOUNTER — Ambulatory Visit: Payer: Commercial Managed Care - PPO | Admitting: Family

## 2023-02-14 ENCOUNTER — Encounter: Payer: Self-pay | Admitting: Skilled Nursing Facility1

## 2023-02-14 ENCOUNTER — Ambulatory Visit: Payer: Medicaid Other | Admitting: Skilled Nursing Facility1

## 2023-02-14 DIAGNOSIS — E669 Obesity, unspecified: Secondary | ICD-10-CM

## 2023-02-14 DIAGNOSIS — E1165 Type 2 diabetes mellitus with hyperglycemia: Secondary | ICD-10-CM | POA: Diagnosis not present

## 2023-02-14 NOTE — Progress Notes (Signed)
Bariatric Nutrition Follow-Up Visit Medical Nutrition Therapy   Surgery date: 09/30/2022 Surgery type: RYGB  Anthropometrics  Start weight at [facility]: 308.0 lbs (date: 04/29/2022)  Height: 62 in Weight today: virtual appt  Clinical  Medical hx: Obesity, hyperlipidemia, HTN, hiatal hernia, T2DM, depression, arthritis, GERD  Medications:  lisinopril, Trulicity: taken off, escitalopram, ondansetron, hydroxyzine, lovastatin, glipizide Labs: A1C 9.4; HDL 56; RBC 5.25, MCV 75.6 Notable signs/symptoms: none noted Any previous deficiencies? No Bowel Habits: Every day to every other day no complaints  Body Composition Scale 12/12/2022 01/01/2023 01/27/2023  Weight  lbs 276.2 271.5 270.2  Total Body Fat  % 48.6 48.2 48.1     Visceral Fat 20 19 19   Fat-Free Mass  % 51.3 51.7 51.8     Total Body Water  % 40.1 40.3 40.4     Muscle-Mass  lbs 30.6 30.6 30.6  BMI 50.4 49.5 49.3  Body Fat Displacement ---          Torso  lbs 83.3 81.2 80.7        Left Leg  lbs 16.6 16.2 16.1        Right Leg  lbs 6.6 16.2 16.1        Left Arm  lbs 8.3 8.1 8.0        Right Arm  lbs 8.3 8.1 8.0    Lifestyle & Dietary Hx  Primary objective: Once the patient demonstrates sufficient ability to meet their needs PO, the dosage of the TF supplement will be gradually decreased. Patient to prioritize diligent logging to ensure needs are being met.  Pt states she is going to start going to pain management and the GI doctor on May 10th due to pain int he last 2 weeks with enteral feed as well as PO intake and with no consumption.  Pt states she agrees with the plan of keeping her enteral feed placed. Pt states she is still eating even with the pain because she knows she needs to eat. Pt states the pain is like a contraction in the side and middle of her belly.  Pt states she is drinking more stating she drinks about 50 ounces a day.  Pt states she did subtract 1 1/2 osmolite. Pt states her energy level is okay but she  cannot walk because that hurts her more.  Pt states her hair loss is getting better.  Pt states she tries 3 solids a day (up from 1-2)   Went well: 4-5 medium sized shrimp Edamame (now making her vomit when previous it was okay) Pork neck bone 3 scallops Salmon patty Okra White fish (3 ounces) Grits salt and pepper + Soy protein isolate Mashed Potatoes salt and pepper or baked potato slat and pepper White Rice salt and pepper Tofu sauteed In oil Green beans Raisin Bran in milk No sugar added applesauce + Soy protein isolate Can peaches 1/3 banana  Nuts Pinto beans Broccoli Pancake with protein Zero bushes baked beans  Peanu butter on whole wheat toast White rice  Does not go well: Egg Beans were ok but still with some pain Mashed potato + canned salmon gave her pain for about 20 minutes  Edamame Cheese Canned greens  Boiled egg Collards/turnips  TF Home recommendations: -1/2 carton (120 ml) Osmolite 1.2 (has been doing 3 times a day) via G-tube -60 ml Prosource TF 20 TID (or equivalent): doing this -30 ml free water before and after each feeding (240 ml daily) -Provides 810 kcals, 86g protein, 75g CHO  and 630 ml H2O   PO: -PO to provide additional kcals, protein and fluid -Continue supplement as tolerated -Continue bariatric MVI: take at the same time 10 minutes in between take it later in the day and calcium supplement regimen once home   Recall: osmolite is room temp -8am TF: 1/2 carton osmolite/30 ml before and after -9am pro-source 1 hour later/30 ml before and after -nap -1pm: neck bone + white rice + string beans -2:30: 1/2 carton osmolite/30 ml before and after -3:30: pro-source 1 hour later/30 ml before and after -8pm: prosource -10:30: 1/2 carton osmolite/30 ml before and after  Beverages: -20 ounces gatorade zero -40 oz  water + Gittel light  -20 diet green tea -16 ounces water with ice -tea which is subtracted from the fluid above     Estimated daily fluid intake: 19 fluid ounces from TF and about 70 ounces PO Estimated daily protein intake: 80 g Supplements: multi and calcium Current average weekly physical activity: ADL's   Post-Op Goals/ Signs/ Symptoms Using straws: no Drinking while eating: no Chewing/swallowing difficulties: no Changes in vision: no Changes to mood/headaches: no Hair loss/changes to skin/nails: yes Difficulty focusing/concentrating: no Sweating: no Limb weakness: no Dizziness/lightheadedness: no Palpitations: no  Carbonated/caffeinated beverages: no N/V/D/C/Gas: bowel movement every other day Abdominal pain: no Dumping syndrome: no    NUTRITION DIAGNOSIS  Overweight/obesity (Phenix-3.3) related to past poor dietary habits and physical inactivity as evidenced by completed bariatric surgery and following dietary guidelines for continued weight loss and healthy nutrition status.     NUTRITION INTERVENTION Nutrition counseling (C-1) and education (E-2) to facilitate bariatric surgery goals, including: -The eventual goal is to transition off of the TF: currently you are not meeting a minimum of 70-75% of your caloric/protein goals orally so the need for the TF is still occurring; this process cannot be rushed in order to mitigate any malnutrition risks      Goals: Try tofu Try Now protein Soy isolate  Start logging everything you eat and drink with specific amounts  Try foods one at a time; do not have more than one food within an hour Try canned green beans  Try some no sugar added applesauce  Multivitamin: take at the same time 10 minutes in between take it later in the day Continue:  or less of a banana  Go for a 10 minute walk each day increasing as able but remember DO NOT OVER DO IT Try grits  Osmolite:  carton 4 times a day Prosource 3 times a day 60 ml 30 ml flushes of plain water before and after each feeding  Try baked tofu Try grilled squash  Cut back PO liquids by  20 ounces and see if that allows more room for food Continue to add protein powder to whatever food makes sense to you  Log the amounts you are able to eat Try 3 solid events in one day: this will replace one half carton of osmolite  Check blood sugar 2 hours after any meal You can try a protein shake PO if it is less pain great

## 2023-02-17 NOTE — Progress Notes (Signed)
Office Visit Note  Patient: Elizabeth [name]             Date of Birth: December 07, 1975           MRN: [id number]             PCP: Sandford Craze, NP Referring: Sandford Craze, NP Visit Date: 02/18/2023 Occupation: Behavioral health MA  Subjective:  New Patient (Initial Visit) (Patient states she is having stomach pain, back pain, and nausea. )   History of Present Illness: Elizabeth [name] is a 47 y.o. female here for evaluation of positive ANA.  Labs checked associated with ongoing symptoms of abdominal and flank pain that has been a problem since around January.  She has history of previous gastric bypass surgery and follows up with gastroenterology and general surgery so far evaluation for the symptoms has been unremarkable.  She also has previous existing symptoms of joint pains in multiple areas with knee osteoarthritis and has some frequent shoulder pain usually provoked with activity and pressure lying on the side.  No previous history of major joint injuries joint surgery and she did not recall any injuries illness or other medical changes prior to the change in symptoms.  She denies morning stiffness or noticing any joint swelling warmth or erythema.  She takes Tylenol as needed for pain avoid NSAIDs due to the GI history. She had some recent skin rash on the right side small raised bumps these were mildly itchy and improved with use of topical steroid cream.  She has previously had some pimple or acne type rashes on the face with slight residual scarring but no ongoing problem.  She has noticed partial thinning of hair around the periphery especially along the front and temporal area of her hairline.  Denies oral or nasal ulcers. She has had previous eye dryness and irritation years ago but not a major ongoing problem.  Never any diagnosis of inflammatory eye disorder does not have impaired vision. Denies symptoms of palpable lymph node swelling, Raynaud's, no history of blood  clots.    Activities of Daily Living:  Patient reports morning stiffness for 0 minute.   Patient Reports nocturnal pain.  Difficulty dressing/grooming: Reports Difficulty climbing stairs: Reports Difficulty getting out of chair: Reports Difficulty using hands for taps, buttons, cutlery, and/or writing: Denies  Review of Systems  Constitutional:  Positive for fatigue.  HENT:  Negative for mouth sores and mouth dryness.   Eyes:  Negative for dryness.  Respiratory:  Negative for shortness of breath.   Cardiovascular:  Negative for chest pain and palpitations.  Gastrointestinal:  Positive for constipation. Negative for blood in stool and diarrhea.  Endocrine: Negative for increased urination.  Genitourinary:  Negative for involuntary urination.  Musculoskeletal:  Positive for gait problem. Negative for joint pain, joint pain, joint swelling, myalgias, muscle weakness, morning stiffness, muscle tenderness and myalgias.  Skin:  Positive for hair loss. Negative for color change, rash and sensitivity to sunlight.  Allergic/Immunologic: Negative for susceptible to infections.  Neurological:  Negative for dizziness and headaches.  Hematological:  Negative for swollen glands.  Psychiatric/Behavioral:  Positive for depressed mood. Negative for sleep disturbance. The patient is nervous/anxious.     PMFS History:  Patient Active Problem List   Diagnosis Date Noted   Bilateral shoulder pain 02/18/2023   History of gastric bypass 12/02/2022   Positive ANA (antinuclear antibody) 11/26/2022   Excessive bleeding in premenopausal period 11/11/2022   Osteoarthritis of knee 03/19/2022   Left knee pain  02/22/2022   Preventative health care 12/31/2021   Drug-induced constipation 12/31/2021   Depression 11/07/2021   Chronic abdominal pain 04/06/2021   Grief reaction 04/06/2021   Gastroesophageal reflux disease without esophagitis    Hyperlipidemia 01/21/2018   Uncontrolled type 2 diabetes  mellitus with hyperglycemia (HCC) 01/21/2018   Keratoconjunctivitis sicca of both eyes not specified as Sjogren's 04/04/2017   Regular astigmatism of both eyes 04/04/2017   Vitamin D deficiency 12/02/2016   Essential hypertension 02/07/2016   Morbid obesity with BMI of 50.0-59.9, adult (HCC) 02/07/2016    Past Medical History:  Diagnosis Date   Anemia    Anxiety    Astigmatism    Depression    Diabetes mellitus without complication (HCC)    Dry mouth    Fatigue    GERD (gastroesophageal reflux disease)    Heartburn    Hepatic steatosis    Hepatomegaly    High cholesterol    Hypertension    Keratoconjunctivitis    Liver disease    Muscle pain    Ovarian cyst    Positive ANA (antinuclear antibody)    Pulmonary nodules    Vitamin D deficiency    Weakness     Family History  Problem Relation Age of Onset   Diabetes Mother    Hypertension Mother    Hyperlipidemia Mother    Obesity Mother    Peripheral Artery Disease Mother    HIV Father        died from complications (IVDU)   Alcohol abuse Father    Drug abuse Father    Ovarian cancer Maternal Grandmother    Lupus Maternal Aunt    Colon cancer Neg Hx    Esophageal cancer Neg Hx    Liver disease Neg Hx    Pancreatic cancer Neg Hx    Stomach cancer Neg Hx    Sleep apnea Neg Hx    Past Surgical History:  Procedure Laterality Date   BIOPSY  01/18/2021   Procedure: BIOPSY;  Surgeon: Shellia Cleverly, DO;  Location: [facility];  Service: Gastroenterology;;   CHOLECYSTECTOMY N/A 05/11/2021   Procedure: LAPAROSCOPIC CHOLECYSTECTOMY WITH INTRAOPERATIVE CHOLANGIOGRAM;  Surgeon: Quentin Ore, MD;  Location: [facility];  Service: General;  Laterality: N/A;   DILATION AND CURETTAGE OF UTERUS     ENDOMETRIAL BIOPSY  12/19/2017   normal per pt   ESOPHAGOGASTRODUODENOSCOPY (EGD) WITH PROPOFOL N/A 01/18/2021   Procedure: ESOPHAGOGASTRODUODENOSCOPY (EGD) WITH PROPOFOL;  Surgeon: Shellia Cleverly, DO;  Location: [facility];  Service: Gastroenterology;  Laterality: N/A;   HIATAL HERNIA REPAIR N/A 09/30/2022   Procedure: HERNIA REPAIR HIATAL;  Surgeon: Quentin Ore, MD;  Location: [facility];  Service: General;  Laterality: N/A;   LAPAROSCOPIC INSERTION GASTROSTOMY TUBE N/A 12/02/2022   Procedure: LAPAROSCOPIC REMNANT GASTROSTOMY G TUBE;  Surgeon: Quentin Ore, MD;  Location: [facility];  Service: General;  Laterality: N/A;   LAPAROSCOPIC ROUX-EN-Y GASTRIC BYPASS WITH HIATAL HERNIA REPAIR  09/30/2022   LAPAROSCOPY N/A 12/02/2022   Procedure: LAPAROSCOPY DIAGNOSTIC;  Surgeon: Quentin Ore, MD;  Location: [facility];  Service: General;  Laterality: N/A;   TONSILLECTOMY     TUBAL LIGATION     UPPER GI ENDOSCOPY N/A 09/30/2022   Procedure: UPPER GI ENDOSCOPY;  Surgeon: Quentin Ore, MD;  Location: [facility];  Service: General;  Laterality: N/A;   UPPER GI ENDOSCOPY N/A 12/02/2022   Procedure: UPPER ENDOSCOPY;  Surgeon: Quentin Ore, MD;  Location: [facility];  Service:  General;  Laterality: N/A;   Social History   Social History Narrative    2 children   1997- son Collier Bullock   2000- son Designer, jewellery in [place] [facility]- CMA in [place]   Enjoys reading   Widowed 28-Jul-2023, husband died from COVID-19.   Immunization History  Administered Date(s) Administered   Influenza,inj,Quad PF,6+ Mos 07/03/2021   Influenza-Unspecified 07/28/2017, 07/27/2018, 08/02/2020, 07/19/2021, 08/07/2022   PFIZER(Purple Top)SARS-COV-2 Vaccination 11/12/2019, 12/03/2019, 09/08/2020   PNEUMOCOCCAL CONJUGATE-20 09/05/2021   Pneumococcal Polysaccharide-23 04/12/2016   Tdap 01/21/2018     Objective: Vital Signs: BP (!) 142/92 (BP Location: Right Arm, Patient Position: Sitting, Cuff Size: Normal)   Pulse 72   Resp 16   Ht 5' 2.5" (1.588 m)   Wt 273 lb (123.8 kg)   BMI 49.14 kg/m    Physical Exam HENT:     Mouth/Throat:     Mouth: Mucous membranes are moist.     Pharynx: Oropharynx is clear.   Eyes:     Conjunctiva/sclera: Conjunctivae normal.  Cardiovascular:     Rate and Rhythm: Normal rate and regular rhythm.  Pulmonary:     Effort: Pulmonary effort is normal.     Breath sounds: Normal breath sounds.  Musculoskeletal:     Right lower leg: No edema.     Left lower leg: No edema.  Lymphadenopathy:     Cervical: No cervical adenopathy.  Skin:    Findings: No rash.  Neurological:     General: No focal deficit present.     Mental Status: She is alert.  Psychiatric:        Mood and Affect: Mood normal.      Musculoskeletal Exam:  Shoulders full ROM pain provoked with overhead abduction and with external rotation at horizontal level, tenderness throughout shoulder and base of cervical spine and thoracic spine paraspinal muscles Elbows full ROM no tenderness or swelling Wrists full ROM no tenderness or swelling Fingers full ROM no tenderness or swelling Knees full ROM no tenderness or swelling Ankles full ROM no tenderness or swelling   Investigation: No additional findings.  Imaging: CT ABDOMEN PELVIS W CONTRAST  Result Date: 02/08/2023 CLINICAL DATA:  Abdominal pain. EXAM: CT ABDOMEN AND PELVIS WITH CONTRAST TECHNIQUE: Multidetector CT imaging of the abdomen and pelvis was performed using the standard protocol following bolus administration of intravenous contrast. RADIATION DOSE REDUCTION: This exam was performed according to the departmental dose-optimization program which includes automated exposure control, adjustment of the mA and/or kV according to patient size and/or use of iterative reconstruction technique. CONTRAST:  OMNIPAQUE IOHEXOL 300 MG/ML  SOLN COMPARISON:  CT dated 11/24/2022. FINDINGS: Lower chest: The visualized lung bases are clear. No intra-abdominal free air or free fluid. Hepatobiliary: The liver is unremarkable no biliary ductal dilatation. Cholecystectomy. No retained calcified stone noted in the central CBD. Pancreas: Unremarkable. No  pancreatic ductal dilatation or surrounding inflammatory changes. Spleen: Normal in size without focal abnormality. Adrenals/Urinary Tract: The adrenal glands are unremarkable. The kidneys, visualized ureters, and urinary bladder appear unremarkable. Stomach/Bowel: There is postsurgical changes of Roux-en-Y gastric bypass. Percutaneous gastrostomy with below in the body of the stomach. There is no bowel obstruction or active inflammation. The appendix is normal. Vascular/Lymphatic: Mild atherosclerotic calcification of the abdominal aorta. The IVC is unremarkable. No portal venous gas. There is no adenopathy. Reproductive: The uterus is anteverted. An intrauterine device is noted. No adnexal masses. Other: None Musculoskeletal: No acute osseous pathology. IMPRESSION: 1. No acute intra-abdominal or pelvic  pathology. Percutaneous gastrostomy with balloon in the body of the stomach. 2. Postsurgical changes of Roux-en-Y gastric bypass. No bowel obstruction. Normal appendix. 3.  Aortic Atherosclerosis (ICD10-I70.0). Electronically Signed   By: Elgie Collard M.D.   On: 02/08/2023 20:06   NM GASTRIC EMPTYING  Result Date: 01/22/2023 : Gastric emptying study: History: nausea, epigastric pain COMPARISON:  None FINDINGS: Examination was performed with 2.2 mCi 31m-Technetium sulfur colloid in egg. Half time for gastric emptying was 34 minutes which is normal. Images demonstrate no evidence of GE reflux. IMPRESSION: Unremarkable gastric emptying study. Electronically Signed   By: Layla Maw M.D.   On: 01/22/2023 16:52    Recent Labs: Lab Results  Component Value Date   WBC 5.5 12/06/2022   HGB 12.1 12/06/2022   PLT 251 12/06/2022   NA 139 12/06/2022   K 4.0 12/06/2022   CL 102 12/06/2022   CO2 23 12/06/2022   GLUCOSE 186 (H) 12/06/2022   BUN 11 12/06/2022   CREATININE 0.62 12/06/2022   BILITOT 0.4 12/06/2022   ALKPHOS 94 12/06/2022   AST 107 (H) 12/06/2022   ALT 126 (H) 12/06/2022   PROT 6.4 (L)  12/06/2022   ALBUMIN 3.1 (L) 12/06/2022   CALCIUM 8.9 12/06/2022   GFRAA 133 12/22/2018    Speciality Comments: No specialty comments available.  Procedures:  No procedures performed Allergies: Tomato   Assessment / Plan:     Visit Diagnoses: Positive ANA (antinuclear antibody) - Plan: RNP Antibody, Anti-Smith antibody, Sjogrens syndrome-B extractable nuclear antibody, Sjogrens syndrome-A extractable nuclear antibody, Anti-DNA antibody, double-stranded, C3 and C4, Anti-scleroderma antibody, Chromatin (Nucleosomal) Antibody  Positive ANA at low titer and very nonspecific symptoms for connective tissue disease.  Will check specific antibody panel as detailed above and serum complements assessing for disease process.  Low pretest suspicion for autoimmune disease as a primary cause of symptoms.  If abnormal would need to follow-up to discussed whether or not treatment or additional observation needed. If results are negative would not recommend any additional testing or treatment for this.  Keratoconjunctivitis sicca of both eyes not specified as Sjogren's  Previous documented dry eye irritation but no specific complaint today and normal appearing on exam.  Chronic pain of both shoulders  Bilateral shoulder pain appears most consistent for some degree of rotator cuff tendinopathy or other muscular strain injury.  Not well localized to the shoulder joint itself and good range of motion is preserved.  Discussed initial treatments including stretching and range of motion could consider referral to physical therapy if symptoms getting worse.  Orders: Orders Placed This Encounter  Procedures   RNP Antibody   Anti-Smith antibody   Sjogrens syndrome-B extractable nuclear antibody   Sjogrens syndrome-A extractable nuclear antibody   Anti-DNA antibody, double-stranded   C3 and C4   Anti-scleroderma antibody   Chromatin (Nucleosomal) Antibody   No orders of the defined types were placed in  this encounter.    Follow-Up Instructions: No follow-ups on file.   Fuller Plan, MD  Note - This record has been created using [employer].  Chart creation errors have been sought, but may not always  have been located. Such creation errors do not reflect on  the standard of medical care.

## 2023-02-18 ENCOUNTER — Telehealth: Payer: Self-pay

## 2023-02-18 ENCOUNTER — Ambulatory Visit: Payer: Medicaid Other | Attending: Internal Medicine | Admitting: Internal Medicine

## 2023-02-18 ENCOUNTER — Encounter: Payer: Self-pay | Admitting: Internal Medicine

## 2023-02-18 VITALS — BP 142/92 | HR 72 | Resp 16 | Ht 62.5 in | Wt 273.0 lb

## 2023-02-18 DIAGNOSIS — M25511 Pain in right shoulder: Secondary | ICD-10-CM | POA: Insufficient documentation

## 2023-02-18 DIAGNOSIS — M25512 Pain in left shoulder: Secondary | ICD-10-CM

## 2023-02-18 DIAGNOSIS — R768 Other specified abnormal immunological findings in serum: Secondary | ICD-10-CM

## 2023-02-18 DIAGNOSIS — H16223 Keratoconjunctivitis sicca, not specified as Sjogren's, bilateral: Secondary | ICD-10-CM

## 2023-02-18 DIAGNOSIS — G8929 Other chronic pain: Secondary | ICD-10-CM | POA: Diagnosis not present

## 2023-02-18 NOTE — Telephone Encounter (Signed)
Agent with [employer] calls for information to assist with completing the patient's disability claim. Advised discussion of the patient will require a signed release of information per HIPAA laws. Fax number given to her.

## 2023-02-19 ENCOUNTER — Other Ambulatory Visit (HOSPITAL_BASED_OUTPATIENT_CLINIC_OR_DEPARTMENT_OTHER): Payer: Self-pay

## 2023-02-19 ENCOUNTER — Ambulatory Visit: Payer: Commercial Managed Care - PPO | Admitting: Family

## 2023-02-19 ENCOUNTER — Other Ambulatory Visit: Payer: Self-pay

## 2023-02-19 LAB — CHROMATIN (NUCLEOSOMAL) ANTIBODY: Chromatin (Nucleosomal) Antibody: <1 AI

## 2023-02-19 LAB — C3 AND C4
C3 Complement: 230 mg/dL — ABNORMAL HIGH (ref 83–193)
C4 Complement: 48 mg/dL (ref 15–57)

## 2023-02-19 LAB — ANTI-DNA ANTIBODY, DOUBLE-STRANDED: ds DNA Ab: <1 IU/mL

## 2023-02-19 LAB — SJOGRENS SYNDROME-B EXTRACTABLE NUCLEAR ANTIBODY: SSB (La) (ENA) Antibody, IgG: <1 AI

## 2023-02-19 LAB — RNP ANTIBODY: Ribonucleic Protein(ENA) Antibody, IgG: <1 AI

## 2023-02-19 LAB — ANTI-SCLERODERMA ANTIBODY: Scleroderma (Scl-70) (ENA) Antibody, IgG: <1 AI

## 2023-02-19 LAB — SJOGRENS SYNDROME-A EXTRACTABLE NUCLEAR ANTIBODY: SSA (Ro) (ENA) Antibody, IgG: <1 AI

## 2023-02-19 LAB — ANTI-SMITH ANTIBODY: ENA SM Ab Ser-aCnc: <1 AI

## 2023-02-20 ENCOUNTER — Other Ambulatory Visit (HOSPITAL_BASED_OUTPATIENT_CLINIC_OR_DEPARTMENT_OTHER): Payer: Self-pay

## 2023-02-20 MED ORDER — OMEPRAZOLE 40 MG PO CPDR
40.0000 mg | DELAYED_RELEASE_CAPSULE | Freq: Two times a day (BID) | ORAL | 0 refills | Status: DC
  Filled 2023-02-20: qty 180, 90d supply, fill #0

## 2023-02-20 MED ORDER — METHOCARBAMOL 500 MG PO TABS
500.0000 mg | ORAL_TABLET | Freq: Four times a day (QID) | ORAL | 0 refills | Status: DC
  Filled 2023-02-20: qty 40, 10d supply, fill #0

## 2023-02-21 ENCOUNTER — Ambulatory Visit (INDEPENDENT_AMBULATORY_CARE_PROVIDER_SITE_OTHER): Payer: Medicaid Other | Admitting: Family

## 2023-02-21 ENCOUNTER — Other Ambulatory Visit (HOSPITAL_BASED_OUTPATIENT_CLINIC_OR_DEPARTMENT_OTHER): Payer: Self-pay

## 2023-02-21 ENCOUNTER — Telehealth: Payer: Self-pay | Admitting: Family

## 2023-02-21 VITALS — BP 128/79 | HR 90 | Temp 98.6°F | Resp 16 | Wt 271.0 lb

## 2023-02-21 DIAGNOSIS — K219 Gastro-esophageal reflux disease without esophagitis: Secondary | ICD-10-CM

## 2023-02-21 DIAGNOSIS — E785 Hyperlipidemia, unspecified: Secondary | ICD-10-CM

## 2023-02-21 DIAGNOSIS — Z7984 Long term (current) use of oral hypoglycemic drugs: Secondary | ICD-10-CM

## 2023-02-21 DIAGNOSIS — R911 Solitary pulmonary nodule: Secondary | ICD-10-CM | POA: Insufficient documentation

## 2023-02-21 DIAGNOSIS — E1165 Type 2 diabetes mellitus with hyperglycemia: Secondary | ICD-10-CM

## 2023-02-21 DIAGNOSIS — N924 Excessive bleeding in the premenopausal period: Secondary | ICD-10-CM | POA: Diagnosis not present

## 2023-02-21 DIAGNOSIS — R918 Other nonspecific abnormal finding of lung field: Secondary | ICD-10-CM | POA: Insufficient documentation

## 2023-02-21 DIAGNOSIS — R768 Other specified abnormal immunological findings in serum: Secondary | ICD-10-CM

## 2023-02-21 DIAGNOSIS — F32A Depression, unspecified: Secondary | ICD-10-CM

## 2023-02-21 DIAGNOSIS — I10 Essential (primary) hypertension: Secondary | ICD-10-CM | POA: Diagnosis not present

## 2023-02-21 MED ORDER — ESCITALOPRAM OXALATE 20 MG PO TABS: 20.0000 mg | ORAL_TABLET | Freq: Every day | ORAL | 0 refills | Status: DC

## 2023-02-21 NOTE — Assessment & Plan Note (Signed)
BP Readings from Last 3 Encounters:  02/21/23 128/79  02/18/23 (!) 142/92  01/02/23 110/68   BP stable, continues low dose lisinopril.

## 2023-02-21 NOTE — Telephone Encounter (Signed)
See mychart.  

## 2023-02-21 NOTE — Assessment & Plan Note (Signed)
Multiple pulmonary nodules- due for follow up CT scan.

## 2023-02-21 NOTE — Assessment & Plan Note (Signed)
Uncontrolled, increase lexapro to 20mg .

## 2023-02-21 NOTE — Assessment & Plan Note (Signed)
Lab Results  Component Value Date   HGBA1C 7.4 (H) 12/02/2022   HGBA1C 10.7 (H) 09/16/2022   HGBA1C 9.5 02/27/2022   Lab Results  Component Value Date   MICROALBUR 4.8 (H) 09/20/2022   LDLCALC 117 02/27/2022   CREATININE 0.62 12/06/2022   It is too soon to check her A1C, she will return in 2 weeks to update her labs.  She is currently maintained on glipizide.

## 2023-02-21 NOTE — Assessment & Plan Note (Signed)
Work up ongoing with Rheumatology.

## 2023-02-21 NOTE — Assessment & Plan Note (Signed)
Maintained on omeprazole, has occasional gerd symptoms if she lays flat.

## 2023-02-21 NOTE — Assessment & Plan Note (Signed)
She has an IUD now- no heavy bleeding, just spotting.

## 2023-02-21 NOTE — Progress Notes (Signed)
Subjective:     Patient ID: Elizabeth [name], female    DOB: 06/04/1976, 47 y.o.   MRN: [id number]  Chief Complaint  Patient presents with   Hypertension    Here for follow up   Diabetes    Here for follow up   Depression    Here for follow up    HPI  Patient is in today for follow up. She has been struggling with chronic abdominal pain which is worse after eating. She is unable to work. Has had extensive GI evaluation including CT abdomen, gastric emptying study, upper gi series with KUB and Endoscopy which were all unrevealing.  Due to malnutrition, her surgeon placed a gastroscopy tube.  She tries to eat what she can in addition to tube feedings. Her surgeon has referred her to pain management.   DM2- She follows with Dr. Roanna Raider and Trulicity was discontinued. She is maintained on glipizide only.   Depression- feeling more down lately due to all of her GI issues.   Lab Results  Component Value Date   HGBA1C 7.4 (H) 12/02/2022   HGBA1C 10.7 (H) 09/16/2022   HGBA1C 9.5 02/27/2022   Lab Results  Component Value Date   MICROALBUR 4.8 (H) 09/20/2022   LDLCALC 117 02/27/2022   CREATININE 0.62 12/06/2022     She is also following with Rheumatology.  She has been referred to pain management.  Notes that whenever her stomach hurts her back hurts.    Health Maintenance Due  Topic Date Due   COVID-19 Vaccine (4 - 2023-24 season) 06/21/2022   OPHTHALMOLOGY EXAM  12/12/2022    Past Medical History:  Diagnosis Date   Anemia    Anxiety    Astigmatism    Depression    Diabetes mellitus without complication (HCC)    Dry mouth    Fatigue    GERD (gastroesophageal reflux disease)    Heartburn    Hepatic steatosis    Hepatomegaly    High cholesterol    Hypertension    Keratoconjunctivitis    Liver disease    Muscle pain    Ovarian cyst    Positive ANA (antinuclear antibody)    Pulmonary nodules    Vitamin D deficiency    Weakness     Past Surgical History:   Procedure Laterality Date   BIOPSY  01/18/2021   Procedure: BIOPSY;  Surgeon: Shellia Cleverly, DO;  Location: [facility];  Service: Gastroenterology;;   CHOLECYSTECTOMY N/A 05/11/2021   Procedure: LAPAROSCOPIC CHOLECYSTECTOMY WITH INTRAOPERATIVE CHOLANGIOGRAM;  Surgeon: Quentin Ore, MD;  Location: [facility];  Service: General;  Laterality: N/A;   DILATION AND CURETTAGE OF UTERUS     ENDOMETRIAL BIOPSY  12/19/2017   normal per pt   ESOPHAGOGASTRODUODENOSCOPY (EGD) WITH PROPOFOL N/A 01/18/2021   Procedure: ESOPHAGOGASTRODUODENOSCOPY (EGD) WITH PROPOFOL;  Surgeon: Shellia Cleverly, DO;  Location: [facility];  Service: Gastroenterology;  Laterality: N/A;   HIATAL HERNIA REPAIR N/A 09/30/2022   Procedure: HERNIA REPAIR HIATAL;  Surgeon: Quentin Ore, MD;  Location: [facility];  Service: General;  Laterality: N/A;   LAPAROSCOPIC INSERTION GASTROSTOMY TUBE N/A 12/02/2022   Procedure: LAPAROSCOPIC REMNANT GASTROSTOMY G TUBE;  Surgeon: Quentin Ore, MD;  Location: [facility];  Service: General;  Laterality: N/A;   LAPAROSCOPIC ROUX-EN-Y GASTRIC BYPASS WITH HIATAL HERNIA REPAIR  09/30/2022   LAPAROSCOPY N/A 12/02/2022   Procedure: LAPAROSCOPY DIAGNOSTIC;  Surgeon: Quentin Ore, MD;  Location: [facility];  Service: General;  Laterality: N/A;  TONSILLECTOMY     TUBAL LIGATION     UPPER GI ENDOSCOPY N/A 09/30/2022   Procedure: UPPER GI ENDOSCOPY;  Surgeon: Quentin Ore, MD;  Location: [facility];  Service: General;  Laterality: N/A;   UPPER GI ENDOSCOPY N/A 12/02/2022   Procedure: UPPER ENDOSCOPY;  Surgeon: Quentin Ore, MD;  Location: [facility];  Service: General;  Laterality: N/A;    Family History  Problem Relation Age of Onset   Diabetes Mother    Hypertension Mother    Hyperlipidemia Mother    Obesity Mother    Peripheral Artery Disease Mother    HIV Father        died from complications (IVDU)   Alcohol abuse Father    Drug abuse Father    Ovarian  cancer Maternal Grandmother    Lupus Maternal Aunt    Colon cancer Neg Hx    Esophageal cancer Neg Hx    Liver disease Neg Hx    Pancreatic cancer Neg Hx    Stomach cancer Neg Hx    Sleep apnea Neg Hx     Social History   Socioeconomic History   Marital status: Widowed    Spouse name: Mellody Dance   Number of children: 2   Years of education: Not on file   Highest education level: Associate degree: occupational, Scientist, product/process development, or vocational program  Occupational History   Occupation: CMA - Geophysicist/field seismologist: [employer]  Tobacco Use   Smoking status: Former    Packs/day: 0.50    Years: 3.00    Additional pack years: 0.00    Total pack years: 1.50    Types: Cigarettes    Quit date: 08/21/2021    Years since quitting: 1.5    Passive exposure: Never   Smokeless tobacco: Never   Tobacco comments:    4-5/day  Vaping Use   Vaping Use: Never used  Substance and Sexual Activity   Alcohol use: No   Drug use: No   Sexual activity: Yes    Partners: Male    Birth control/protection: Surgical, Implant  Other Topics Concern   Not on file  Social History Narrative    2 children   1997- son Collier Bullock   2000- son Designer, jewellery in [place] [facility]- CMA in [place]   Enjoys reading   Widowed July 21, 2023, husband died from COVID-19.   Social Determinants of Health   Financial Resource Strain: Low Risk  (02/19/2023)   Overall Financial Resource Strain (CARDIA)    Difficulty of Paying Living Expenses: Not hard at all  Food Insecurity: No Food Insecurity (02/19/2023)   Hunger Vital Sign    Worried About Running Out of Food in the Last Year: Never true    Ran Out of Food in the Last Year: Never true  Transportation Needs: No Transportation Needs (02/19/2023)   PRAPARE - Administrator, Civil Service (Medical): No    Lack of Transportation (Non-Medical): No  Physical Activity: Insufficiently Active (02/19/2023)   Exercise Vital Sign    Days of Exercise per Week: 4 days     Minutes of Exercise per Session: 10 min  Stress: No Stress Concern Present (02/19/2023)   [employer] of Occupational Health - Occupational Stress Questionnaire    Feeling of Stress : Only a little  Social Connections: Moderately Integrated (02/19/2023)   Social Connection and Isolation Panel [NHANES]    Frequency of Communication with Friends and Family: Three times a week  Frequency of Social Gatherings with Friends and Family: Once a week    Attends Religious Services: More than 4 times per year    Active Member of [employer] or Organizations: Yes    Attends Banker Meetings: More than 4 times per year    Marital Status: Widowed  Intimate Partner Violence: Not At Risk (12/02/2022)   Humiliation, Afraid, Rape, and Kick questionnaire    Fear of Current or Ex-Partner: No    Emotionally Abused: No    Physically Abused: No    Sexually Abused: No    Outpatient Medications Prior to Visit  Medication Sig Dispense Refill   Blood Glucose Monitoring Suppl (FREESTYLE LITE) w/Device KIT Use to check blood sugar 4 times a day as directed 1 kit 0   CALCIUM-VITAMIN D PO Take 2 tablets by mouth every evening.     glipiZIDE (GLIPIZIDE XL) 5 MG 24 hr tablet Take 1 tablet (5 mg total) by mouth every morning with food. 90 tablet 3   glucose blood (FREESTYLE LITE) test strip Use to check blood sugar 4 times a day as directed 100 each 5   hydrOXYzine (ATARAX) 25 MG tablet Take 2 tablets by mouth at bedtime as needed for insomnia. 60 tablet 3   Lancets (FREESTYLE) lancets Use to check blood sugar 4 times a day as directed 100 each 0   lisinopril (ZESTRIL) 5 MG tablet Take 1 tablet (5 mg total) by mouth daily. 90 tablet 1   lovastatin (MEVACOR) 40 MG tablet TAKE 1 TABLET BY MOUTH AT BEDTIME. 30 tablet 11   methocarbamol (ROBAXIN) 500 MG tablet Take 1 tablet (500 mg total) by mouth 4 (four) times daily for 10 days. 40 tablet 0   Multiple Vitamin (MULTIVITAMIN WITH MINERALS) TABS tablet Take 2  tablets by mouth every evening.     omeprazole (PRILOSEC) 40 MG capsule Take 1 capsule (40 mg total) by mouth in the morning and at bedtime. 180 capsule 0   ondansetron (ZOFRAN-ODT) 4 MG disintegrating tablet Take 1 tablet (4 mg total) by mouth every 6 (six) hours as needed. 20 tablet 0   sucralfate (CARAFATE) 1 g tablet Take 1 tablet (1 g total) by mouth 4 (four) times daily before meals and nightly. 120 tablet 11   escitalopram (LEXAPRO) 10 MG tablet Take 1.5 tablets (15 mg total) by mouth daily. 45 tablet 1   Facility-Administered Medications Prior to Visit  Medication Dose Route Frequency Provider Last Rate Last Admin   sodium chloride 0.9 % 1,000 mL with thiamine 100 mg, folic acid 1 mg, M.V.I. Adult 10 mL infusion   Intravenous Once Stechschulte, Hyman Hopes, MD       sodium chloride 0.9 % 1,000 mL with thiamine 100 mg, folic acid 1 mg, M.V.I. Adult 10 mL infusion   Intravenous Once Stechschulte, Hyman Hopes, MD       sodium chloride 0.9 % bolus 1,000 mL  1,000 mL Intravenous Once Stechschulte, Hyman Hopes, MD       sodium chloride 0.9 % bolus 1,000 mL  1,000 mL Intravenous Once Stechschulte, Hyman Hopes, MD        Allergies  Allergen Reactions   Tomato Hives, Itching and Rash    ROS See HPI    Objective:    Physical Exam Constitutional:      General: She is not in acute distress.    Appearance: Normal appearance. She is well-developed.  HENT:     Head: Normocephalic and atraumatic.  Right Ear: External ear normal.     Left Ear: External ear normal.  Eyes:     General: No scleral icterus. Neck:     Thyroid: No thyromegaly.  Cardiovascular:     Rate and Rhythm: Normal rate and regular rhythm.     Heart sounds: Normal heart sounds. No murmur heard. Pulmonary:     Effort: Pulmonary effort is normal. No respiratory distress.     Breath sounds: Normal breath sounds. No wheezing.  Musculoskeletal:     Cervical back: Neck supple.  Skin:    General: Skin is warm and dry.  Neurological:      Mental Status: She is alert and oriented to person, place, and time.  Psychiatric:        Mood and Affect: Mood normal.        Behavior: Behavior normal.        Thought Content: Thought content normal.        Judgment: Judgment normal.    Diabetic Foot Exam - Simple   Simple Foot Form Diabetic Foot exam was performed with the following findings: Yes 02/21/2023 11:11 AM  Visual Inspection No deformities, no ulcerations, no other skin breakdown bilaterally: Yes Sensation Testing Intact to touch and monofilament testing bilaterally: Yes Pulse Check Posterior Tibialis and Dorsalis pulse intact bilaterally: Yes Comments     BP 128/79 (BP Location: Right Arm, Patient Position: Sitting, Cuff Size: Large)   Pulse 90   Temp 98.6 F (37 C) (Oral)   Resp 16   Wt 271 lb (122.9 kg)   SpO2 100%   BMI 48.78 kg/m  Wt Readings from Last 3 Encounters:  02/21/23 271 lb (122.9 kg)  02/18/23 273 lb (123.8 kg)  01/27/23 270 lb 3.2 oz (122.6 kg)       Assessment & Plan:    Problem List Items Addressed This Visit       Unprioritized   Uncontrolled type 2 diabetes mellitus with hyperglycemia (HCC)    Lab Results  Component Value Date   HGBA1C 7.4 (H) 12/02/2022   HGBA1C 10.7 (H) 09/16/2022   HGBA1C 9.5 02/27/2022   Lab Results  Component Value Date   MICROALBUR 4.8 (H) 09/20/2022   LDLCALC 117 02/27/2022   CREATININE 0.62 12/06/2022  It is too soon to check her A1C, she will return in 2 weeks to update her labs.  She is currently maintained on glipizide.        Relevant Orders   HgB A1c   Comp Met (CMET)   Positive ANA (antinuclear antibody)    Work up ongoing with Rheumatology.       Multiple pulmonary nodules    Multiple pulmonary nodules- due for follow up CT scan.       Relevant Orders   CT Chest Wo Contrast   Hyperlipidemia   Relevant Orders   Lipid panel   Gastroesophageal reflux disease without esophagitis    Maintained on omeprazole, has occasional gerd  symptoms if she lays flat.       Excessive bleeding in premenopausal period    She has an IUD now- no heavy bleeding, just spotting.      Essential hypertension    BP Readings from Last 3 Encounters:  02/21/23 128/79  02/18/23 (!) 142/92  01/02/23 110/68  BP stable, continues low dose lisinopril.       Depression - Primary    Uncontrolled, increase lexapro to 20mg .       Relevant Medications   escitalopram (LEXAPRO)  20 MG tablet    I have discontinued Chalet Lex's escitalopram. I am also having her start on escitalopram. Additionally, I am having her maintain her hydrOXYzine, FreeStyle Lite, freestyle, FREESTYLE LITE, lovastatin, lisinopril, sucralfate, multivitamin with minerals, CALCIUM-VITAMIN D PO, glipiZIDE, ondansetron, omeprazole, and methocarbamol.  Meds ordered this encounter  Medications   escitalopram (LEXAPRO) 20 MG tablet    Sig: Take 1 tablet (20 mg total) by mouth daily.    Dispense:  90 tablet    Refill:  0    Order Specific Question:   Supervising Provider    Answer:   Danise Edge A [[id number]]

## 2023-02-24 ENCOUNTER — Other Ambulatory Visit (HOSPITAL_BASED_OUTPATIENT_CLINIC_OR_DEPARTMENT_OTHER): Payer: Self-pay

## 2023-02-24 ENCOUNTER — Encounter: Payer: Self-pay | Admitting: Family

## 2023-02-24 MED ORDER — TRAMADOL HCL 50 MG PO TABS: 50.0000 mg | ORAL_TABLET | Freq: Four times a day (QID) | ORAL | 0 refills | Status: DC | PRN

## 2023-02-25 ENCOUNTER — Other Ambulatory Visit: Payer: Self-pay | Admitting: *Deleted

## 2023-02-25 MED ORDER — ONDANSETRON 4 MG PO TBDP: 4.0000 mg | ORAL_TABLET | Freq: Four times a day (QID) | ORAL | 0 refills | Status: DC | PRN

## 2023-02-25 NOTE — Telephone Encounter (Signed)
From: [name] To: Office of Amy [place], [place] Sent: 02/25/2023 10:04 AM EDT Subject: Medication Renewal Request  Refills have been requested for the following medications:   ondansetron (ZOFRAN-ODT) 4 MG disintegrating tablet [Amy Ester[place]]  Preferred pharmacy: [id number] - [place], [place] - [address] Delivery method: [employer]

## 2023-02-25 NOTE — Telephone Encounter (Signed)
Rx sent. Patient has upcoming appointment with Mike Gip, PA-C 02/28/23.

## 2023-02-28 ENCOUNTER — Encounter: Payer: Self-pay | Admitting: Internal Medicine

## 2023-02-28 ENCOUNTER — Telehealth: Payer: Self-pay | Admitting: Physician Assistant

## 2023-02-28 ENCOUNTER — Ambulatory Visit (INDEPENDENT_AMBULATORY_CARE_PROVIDER_SITE_OTHER): Payer: Medicaid Other | Admitting: Physician Assistant

## 2023-02-28 ENCOUNTER — Encounter: Payer: Self-pay | Admitting: Physician Assistant

## 2023-02-28 VITALS — BP 120/80 | HR 85 | Ht 62.0 in | Wt 271.0 lb

## 2023-02-28 DIAGNOSIS — Z8719 Personal history of other diseases of the digestive system: Secondary | ICD-10-CM

## 2023-02-28 DIAGNOSIS — Z931 Gastrostomy status: Secondary | ICD-10-CM

## 2023-02-28 DIAGNOSIS — R1013 Epigastric pain: Secondary | ICD-10-CM | POA: Diagnosis not present

## 2023-02-28 DIAGNOSIS — R110 Nausea: Secondary | ICD-10-CM

## 2023-02-28 DIAGNOSIS — Z9889 Other specified postprocedural states: Secondary | ICD-10-CM

## 2023-02-28 DIAGNOSIS — Z9884 Bariatric surgery status: Secondary | ICD-10-CM | POA: Diagnosis not present

## 2023-02-28 DIAGNOSIS — Z01818 Encounter for other preprocedural examination: Secondary | ICD-10-CM

## 2023-02-28 MED ORDER — TRAMADOL HCL 50 MG PO TABS: 50.0000 mg | ORAL_TABLET | Freq: Four times a day (QID) | ORAL | 0 refills | Status: AC | PRN

## 2023-02-28 MED ORDER — ONDANSETRON 4 MG PO TBDP: 4.0000 mg | ORAL_TABLET | Freq: Four times a day (QID) | ORAL | 3 refills | Status: DC | PRN

## 2023-02-28 MED ORDER — TRAMADOL HCL 50 MG PO TABS: 50.0000 mg | ORAL_TABLET | Freq: Four times a day (QID) | ORAL | 0 refills | Status: DC | PRN

## 2023-02-28 MED ORDER — OMEPRAZOLE 40 MG PO CPDR: 40.0000 mg | DELAYED_RELEASE_CAPSULE | Freq: Two times a day (BID) | ORAL | 0 refills | Status: DC

## 2023-02-28 NOTE — Telephone Encounter (Signed)
Patient is referring to labs drawn on 02/18/2023. Please advise.

## 2023-02-28 NOTE — Telephone Encounter (Signed)
Patient aware that Rx for tramadol was faxed to [facility] pharmacy by Amy, PA.  Spoke with pharmacy and they confirmed that the Rx was received.  Patient agreed to plan and verbalized understanding. No further questions.

## 2023-02-28 NOTE — Telephone Encounter (Signed)
Patient called stating she had a office visit today and was prescribed Tramadol but her pharmacy has not received the prescription yet. Please advise, thank you.

## 2023-02-28 NOTE — Addendum Note (Signed)
Addended by: Sammuel Cooper on: 02/28/2023 04:29 PM   Modules accepted: Orders

## 2023-02-28 NOTE — Progress Notes (Signed)
Subjective:    Patient ID: Elizabeth [name], female    DOB: [birth year], 47 y.o.   MRN: [id number]  HPI Elizabeth [name] is a pleasant 47 year old African-American female, established with Dr. Barron Alvine, who was seen by myself on 01/02/2023, and comes in today for follow-up.  She has had a difficult year.  She had undergone laparoscopic cholecystectomy in 2022, then with history of chronic GERD, and morbid obesity she underwent Roux-en-Y gastric bypass and hiatal hernia repair with Dr. Royanne Foots on 09/30/2022.  She did fine for short period of time post operatively but within a month started having difficulty with abdominal pain nausea and vomiting with inability to keep down p.o.'s.  She had been on Trulicity which was discontinued.  She has since had fairly extensive workup including CT of the abdomen pelvis in January 2024 showing hepatomegaly and otherwise unremarkable. Upper GI February 2024 unremarkable and no GERD noted Repeat surgical procedure 12/02/2022 with EGD done at the time of surgery and read as unremarkable then diagnostic lap with lysis of adhesions and placement of a feeding tube into the gastric remnant. When she was seen in March she was consuming primarily 4 cans of Osmolite daily, 3 Prosource daily and eating a small amount of a very soft diet.  She had lost about 42 pounds since surgery at that time.  She was continuing to complain of epigastric pain and some right upper quadrant pain postprandially. We adjusted her PPI for better absorption, was asked to take the omeprazole 40 mg by opening the capsule and sprinkling on applesauce or yogurt twice daily.  She continued Carafate for couple more weeks and then stopped that.  She is continuing to use Zofran as needed. Gastric emptying scan has been done and was normal Weight at last visit was 272, weight 271 today  She is still miserable, says her nausea is worse though not constant it comes and goes throughout the day seems to be worse  after eating.  Usually early in the morning she will have nausea and will have occasional episodes of vomiting.  She describes her pain as being constant and worse after eating located in the epigastric area, and of high under her ribs.  She had seen surgery back for follow-up a couple of weeks ago and decision was made to stop the tube feedings and to continue oral intake as tolerated.  It sounds as if she is able to eat a soft bland diet but has pain afterwards. Repeat CT was done on 02/08/2023 shows her to be status post Roux-en-Y gastric bypass gastrostomy and placed, no dilated bowel or evidence of obstruction or other abnormality. She has been prescribed Ultram and says that helps some with pain-her PCP is referring her to pain management and she is requesting a new prescription for Ultram today.  Review of Systems Pertinent positive and negative review of systems were noted in the above HPI section.  All other review of systems was otherwise negative.   Outpatient Encounter Medications as of 02/28/2023  Medication Sig   Blood Glucose Monitoring Suppl (FREESTYLE LITE) w/Device KIT Use to check blood sugar 4 times a day as directed   CALCIUM-VITAMIN D PO Take 2 tablets by mouth every evening.   escitalopram (LEXAPRO) 20 MG tablet Take 1 tablet (20 mg total) by mouth daily.   glipiZIDE (GLIPIZIDE XL) 5 MG 24 hr tablet Take 1 tablet (5 mg total) by mouth every morning with food.   glucose blood (FREESTYLE LITE) test strip Use to  check blood sugar 4 times a day as directed   hydrOXYzine (ATARAX) 25 MG tablet Take 2 tablets by mouth at bedtime as needed for insomnia.   Lancets (FREESTYLE) lancets Use to check blood sugar 4 times a day as directed   lisinopril (ZESTRIL) 5 MG tablet Take 1 tablet (5 mg total) by mouth daily.   lovastatin (MEVACOR) 40 MG tablet TAKE 1 TABLET BY MOUTH AT BEDTIME.   methocarbamol (ROBAXIN) 500 MG tablet Take 1 tablet (500 mg total) by mouth 4 (four) times daily for 10  days.   Multiple Vitamin (MULTIVITAMIN WITH MINERALS) TABS tablet Take 2 tablets by mouth every evening.   [DISCONTINUED] omeprazole (PRILOSEC) 40 MG capsule Take 1 capsule (40 mg total) by mouth in the morning and at bedtime.   [DISCONTINUED] ondansetron (ZOFRAN-ODT) 4 MG disintegrating tablet Take 1 tablet (4 mg total) by mouth every 6 (six) hours as needed.   [DISCONTINUED] traMADol (ULTRAM) 50 MG tablet Take 1 tablet (50 mg total) by mouth every 6 (six) hours as needed for up to 5 days.   omeprazole (PRILOSEC) 40 MG capsule Take 1 capsule (40 mg total) by mouth in the morning and at bedtime. Open and sprinkle with applesauce twice daily   ondansetron (ZOFRAN-ODT) 4 MG disintegrating tablet Take 1 tablet (4 mg total) by mouth every 6 (six) hours as needed.   sucralfate (CARAFATE) 1 g tablet Take 1 tablet (1 g total) by mouth 4 (four) times daily before meals and nightly.   traMADol (ULTRAM) 50 MG tablet Take 1 tablet (50 mg total) by mouth every 6 (six) hours as needed for up to 5 days.   [DISCONTINUED] traZODone (DESYREL) 50 MG tablet 1/2 to 1 tab by mouth at bedtime as needed for sleep   Facility-Administered Encounter Medications as of 02/28/2023  Medication   sodium chloride 0.9 % 1,000 mL with thiamine 100 mg, folic acid 1 mg, M.V.I. Adult 10 mL infusion   sodium chloride 0.9 % 1,000 mL with thiamine 100 mg, folic acid 1 mg, M.V.I. Adult 10 mL infusion   sodium chloride 0.9 % bolus 1,000 mL   sodium chloride 0.9 % bolus 1,000 mL   Allergies  Allergen Reactions   Tomato Hives, Itching and Rash   Patient Active Problem List   Diagnosis Date Noted   Multiple pulmonary nodules 02/21/2023   Bilateral shoulder pain 02/18/2023   History of gastric bypass 12/02/2022   Positive ANA (antinuclear antibody) 11/26/2022   Excessive bleeding in premenopausal period 11/11/2022   Osteoarthritis of knee 03/19/2022   Left knee pain 02/22/2022   Preventative health care 12/31/2021   Drug-induced  constipation 12/31/2021   Depression 11/07/2021   Chronic abdominal pain 04/06/2021   Grief reaction 04/06/2021   Gastroesophageal reflux disease without esophagitis    Hyperlipidemia 01/21/2018   Uncontrolled type 2 diabetes mellitus with hyperglycemia (HCC) 01/21/2018   Keratoconjunctivitis sicca of both eyes not specified as Sjogren's 04/04/2017   Regular astigmatism of both eyes 04/04/2017   Vitamin D deficiency 12/02/2016   Essential hypertension 02/07/2016   Morbid obesity with BMI of 50.0-59.9, adult (HCC) 02/07/2016   Social History   Socioeconomic History   Marital status: Widowed    Spouse name: Elizabeth [name]   Number of children: 2   Years of education: Not on file   Highest education level: Associate degree: occupational, Scientist, product/process development, or vocational program  Occupational History   Occupation: CMA - Geophysicist/field seismologist: [employer]  Tobacco Use  Smoking status: Former    Packs/day: 0.50    Years: 3.00    Additional pack years: 0.00    Total pack years: 1.50    Types: Cigarettes    Quit date: 08/21/2021    Years since quitting: 1.5    Passive exposure: Never   Smokeless tobacco: Never   Tobacco comments:    4-5/day  Vaping Use   Vaping Use: Never used  Substance and Sexual Activity   Alcohol use: No   Drug use: No   Sexual activity: Yes    Partners: Male    Birth control/protection: Surgical, Implant  Other Topics Concern   Not on file  Social History Narrative    2 children   1997- son Collier Bullock   2000- son Designer, jewellery in [place] [facility]- CMA in [place]   Enjoys reading   Widowed Jul 24, 2023, husband died from COVID-19.   Social Determinants of Health   Financial Resource Strain: Low Risk  (02/19/2023)   Overall Financial Resource Strain (CARDIA)    Difficulty of Paying Living Expenses: Not hard at all  Food Insecurity: No Food Insecurity (02/19/2023)   Hunger Vital Sign    Worried About Running Out of Food in the Last Year: Never true    Ran  Out of Food in the Last Year: Never true  Transportation Needs: No Transportation Needs (02/19/2023)   PRAPARE - Administrator, Civil Service (Medical): No    Lack of Transportation (Non-Medical): No  Physical Activity: Insufficiently Active (02/19/2023)   Exercise Vital Sign    Days of Exercise per Week: 4 days    Minutes of Exercise per Session: 10 min  Stress: No Stress Concern Present (02/19/2023)   [employer] of Occupational Health - Occupational Stress Questionnaire    Feeling of Stress : Only a little  Social Connections: Moderately Integrated (02/19/2023)   Social Connection and Isolation Panel [NHANES]    Frequency of Communication with Friends and Family: Three times a week    Frequency of Social Gatherings with Friends and Family: Once a week    Attends Religious Services: More than 4 times per year    Active Member of [employer] or Organizations: Yes    Attends Banker Meetings: More than 4 times per year    Marital Status: Widowed  Intimate Partner Violence: Not At Risk (12/02/2022)   Humiliation, Afraid, Rape, and Kick questionnaire    Fear of Current or Ex-Partner: No    Emotionally Abused: No    Physically Abused: No    Sexually Abused: No    Ms. Gritz's family history includes Alcohol abuse in her father; Diabetes in her mother; Drug abuse in her father; HIV in her father; Hyperlipidemia in her mother; Hypertension in her mother; Lupus in her maternal aunt; Obesity in her mother; Ovarian cancer in her maternal grandmother; Peripheral Artery Disease in her mother.      Objective:    Vitals:   02/28/23 0959  BP: 120/80  Pulse: 85    Physical Exam Well-developed morbidly obese African-American female in no acute distress.  Accompanied by her son Weight, 271 BMI 49.57 HEENT; nontraumatic normocephalic, EOMI, PE R LA, sclera anicteric. Oropharynx; not examined today Neck; supple, no JVD Cardiovascular; regular rate and rhythm with S1-S2, no  murmur rub or gallop Pulmonary; Clear bilaterally Abdomen; soft, morbidly obese,  nondistended, gastrostomy tube in the left epigastrium, she is tender in the epigastrium and hypogastrium no definite costal margin tenderness, no  palpable mass or hepatosplenomegaly, bowel sounds are active Rectal; not done today Skin; benign exam, no jaundice rash or appreciable lesions Extremities; no clubbing cyanosis or edema skin warm and dry Neuro/Psych; alert and oriented x4, grossly nonfocal mood and affect appropriate        Assessment & Plan:   #78 47 year old African-American female with morbid obesity and history of chronic GERD status post Roux-en-Y gastric bypass and hiatal hernia repair December 2023.  Is had a difficult course since.  10 [place] to have epigastric pain and nausea. Fairly extensive workup postoperatively and ultimately underwent a repeat surgical procedure 12/10/2022 with EGD done at the time of surgery read as unremarkable then diagnostic laparoscopy with lysis of adhesions and placement of a feeding tube into the gastric remnant.  Since then repeat CT of the abdomen and pelvis unremarkable and gastric emptying scan negative.  She has been off tube feedings over the past couple weeks and seems to be able to tolerate a very soft diet though complains of increased abdominal pain immediately after eating in the epigastrium, epigastric abdominal pain off and on throughout the day, nausea and occasional vomiting.  Patient feels her pain is worse  Trulicity stopped several months ago Off Carafate Continues twice daily omeprazole opening capsule and sprinkling on applesauce.  Etiology of her continued severe epigastric pain, nausea and occasional vomiting is unclear Rule out marginal ulceration, severe gastropathy, dysfunctional limb  #2 morbid obesity-weight stable over the past 6 weeks #3 status post laparoscopic cholecystectomy 2022 3.  Adult onset diabetes mellitus 4.   Hypertension  Plan; refill Zofran 4 mg ODT every 6 hours as needed Continue omeprazole 40 mg p.o. twice daily, open capsule and take with applesauce or yogurt twice daily-refill sent Have refilled tramadol 50 mg 1 p.o. every 6 hours as needed for pain #15 no refills Patient will be scheduled for EGD with Dr. Barron Alvine.  Procedure was discussed in detail with patient including indications risk benefits and she is agreeable to proceed. Further recommendations pending EGD, hopefully feeding tube will be able to be removed soon. Brantlee Penn Oswald Hillock PA-C 02/28/2023   Cc: Sandford Craze, NP

## 2023-02-28 NOTE — Patient Instructions (Addendum)
_______________________________________________________  If your blood pressure at your visit was 140/90 or greater, please contact your primary care physician to follow up on this.  If you are age 45 or younger, your body mass index should be between 19-25. Your Body mass index is 49.57 kg/m. If this is out of the aformentioned range listed, please consider follow up with your Primary Care Provider.  ________________________________________________________  The [facility] providers would like to encourage you to use [facility] to communicate with providers for non-urgent requests or questions.  Due to long hold times on the telephone, sending your provider a message by [facility] may be a faster and more efficient way to get a response.  Please allow 48 business hours for a response.  Please remember that this is for non-urgent requests.  _______________________________________________________  Elizabeth [name] have been scheduled for an endoscopy. Please follow written instructions given to you at your visit today. If you use inhalers (even only as needed), please bring them with you on the day of your procedure.  Due to recent changes in healthcare laws, you may see the results of your imaging and laboratory studies on MyChart before your provider has had a chance to review them.  We understand that in some cases there may be results that are confusing or concerning to you. Not all laboratory results come back in the same time frame and the provider may be waiting for multiple results in order to interpret others.  Please give [name] 48 hours in order for your provider to thoroughly review all the results before contacting the office for clarification of your results.   CONTINUE: Zofran CONTINUE: Omeprazole 40mg  open and sprinkle with applesauce twice daily  We have sent the following medications to your pharmacy for you to pick up at your convenience: Tramadol 50mg  one tablet every 6 hours as needed for  pain.  Thank you for entrusting me with your care and choosing [facility].  Amy Esterwood, PA-C

## 2023-03-01 ENCOUNTER — Encounter: Payer: Self-pay | Admitting: Certified Registered Nurse Anesthetist

## 2023-03-03 ENCOUNTER — Ambulatory Visit (AMBULATORY_SURGERY_CENTER): Payer: Medicaid Other | Admitting: Gastroenterology

## 2023-03-03 ENCOUNTER — Encounter: Payer: Self-pay | Admitting: Gastroenterology

## 2023-03-03 VITALS — BP 145/79 | HR 67 | Temp 98.6°F | Resp 12 | Ht 63.0 in | Wt 272.6 lb

## 2023-03-03 DIAGNOSIS — K2970 Gastritis, unspecified, without bleeding: Secondary | ICD-10-CM | POA: Diagnosis not present

## 2023-03-03 DIAGNOSIS — R110 Nausea: Secondary | ICD-10-CM | POA: Diagnosis not present

## 2023-03-03 DIAGNOSIS — R1013 Epigastric pain: Secondary | ICD-10-CM

## 2023-03-03 DIAGNOSIS — K229 Disease of esophagus, unspecified: Secondary | ICD-10-CM | POA: Diagnosis not present

## 2023-03-03 DIAGNOSIS — Z9884 Bariatric surgery status: Secondary | ICD-10-CM | POA: Diagnosis not present

## 2023-03-03 DIAGNOSIS — K2950 Unspecified chronic gastritis without bleeding: Secondary | ICD-10-CM | POA: Diagnosis not present

## 2023-03-03 MED ORDER — SODIUM CHLORIDE 0.9 % IV SOLN
500.0000 mL | Freq: Once | INTRAVENOUS | Status: DC
Start: 2023-03-03 — End: 2023-03-03

## 2023-03-03 NOTE — Progress Notes (Signed)
1413 Robinul 0.1 mg IV given due large amount of secretions upon assessment.  MD made aware, vss  ?

## 2023-03-03 NOTE — Progress Notes (Signed)
Called to room to assist during endoscopic procedure.  Patient ID and intended procedure confirmed with present staff. Received instructions for my participation in the procedure from the performing physician.  

## 2023-03-03 NOTE — Op Note (Signed)
[facility] Patient Name: Elizabeth [name] Procedure Date: 03/03/2023 2:05 PM MRN: [id number] Endoscopist: Doristine Locks , MD, [id number] Age: 47 Referring MD:  Date of Birth: 1976-02-28 Gender: Female Account #: [id number] Procedure:                Upper GI endoscopy Indications:              Epigastric abdominal pain, Nausea with vomiting Medicines:                Monitored Anesthesia Care Procedure:                Pre-Anesthesia Assessment:                           - Prior to the procedure, a History and Physical                            was performed, and patient medications and                            allergies were reviewed. The patient's tolerance of                            previous anesthesia was also reviewed. The risks                            and benefits of the procedure and the sedation                            options and risks were discussed with the patient.                            All questions were answered, and informed consent                            was obtained. Prior Anticoagulants: The patient has                            taken no anticoagulant or antiplatelet agents. ASA                            Grade Assessment: III - A patient with severe                            systemic disease. After reviewing the risks and                            benefits, the patient was deemed in satisfactory                            condition to undergo the procedure.                           After obtaining informed consent, the endoscope was  passed under direct vision. Throughout the                            procedure, the patient's blood pressure, pulse, and                            oxygen saturations were monitored continuously. The                            Olympus Scope [id number] was introduced through the                            mouth, and advanced to the jejunum. The upper GI                             endoscopy was accomplished without difficulty. The                            patient tolerated the procedure well. Scope In: Scope Out: Findings:                 Two areas of ectopic gastric mucosa were found in                            the upper third of the esophagus.                           Scattered islands of salmon-colored mucosa were                            present from 39 to 40 cm. No other visible                            abnormalities were present. Biopsies were taken                            with a cold forceps for histology. Estimated blood                            loss was minimal.                           The Z-line was otherwise regular and was found 41                            cm from the incisors.                           Evidence of a Roux-en-Y gastrojejunostomy was                            found. The gastrojejunal anastomosis was                            characterized by healthy appearing mucosa.  This was                            traversed. The pouch-to-jejunum limb was                            characterized by healthy appearing mucosa. There                            were no visible sutures or surgical staples. The                            blind afferent limb measured 2.5-3 cm and was                            normal appearing.                           Patchy mild inflammation characterized by erythema                            was found in the visualized stomach. Biopsies were                            taken with a cold forceps for histology and                            Helicobacter pylori testing. Estimated blood loss                            was minimal.                           The examined jejunum was normal. Complications:            No immediate complications. Estimated Blood Loss:     Estimated blood loss was minimal. Impression:               - Ectopic gastric mucosa in the upper third of the                             esophagus.                           - Salmon-colored mucosa. Biopsied.                           - Z-line regular, 41 cm from the incisors.                           - Roux-en-Y gastrojejunostomy with gastrojejunal                            anastomosis characterized by healthy appearing                            mucosa.                           -  Mild, non-ulcer gastritis. Biopsied.                           - Normal examined jejunum. Recommendation:           - Patient has a contact number available for                            emergencies. The signs and symptoms of potential                            delayed complications were discussed with the                            patient. Return to normal activities tomorrow.                            Written discharge instructions were provided to the                            patient.                           - Resume previous diet.                           - Continue present medications.                           - Await pathology results. Doristine Locks, MD 03/03/2023 2:31:31 PM

## 2023-03-03 NOTE — Progress Notes (Signed)
Agree with the assessment and plan as outlined by Amy Esterwood, PA-C.  Macy Lingenfelter, DO, FACG  

## 2023-03-03 NOTE — Progress Notes (Signed)
Report given to PACU, vss 

## 2023-03-03 NOTE — Patient Instructions (Signed)
Impression/Recommendations:  Gastritis handout given to patient.  Resume previous diet. Continue present medications. Await pathology results.  YOU HAD AN ENDOSCOPIC PROCEDURE TODAY AT THE [facility]:   Refer to the procedure report that was given to you for any specific questions about what was found during the examination.  If the procedure report does not answer your questions, please call your gastroenterologist to clarify.  If you requested that your care partner not be given the details of your procedure findings, then the procedure report has been included in a sealed envelope for you to review at your convenience later.  YOU SHOULD EXPECT: Some feelings of bloating in the abdomen. Passage of more gas than usual.  Walking can help get rid of the air that was put into your GI tract during the procedure and reduce the bloating. If you had a lower endoscopy (such as a colonoscopy or flexible sigmoidoscopy) you may notice spotting of blood in your stool or on the toilet paper. If you underwent a bowel prep for your procedure, you may not have a normal bowel movement for a few days.  Please Note:  You might notice some irritation and congestion in your nose or some drainage.  This is from the oxygen used during your procedure.  There is no need for concern and it should clear up in a day or so.  SYMPTOMS TO REPORT IMMEDIATELY: Following upper endoscopy (EGD)  Vomiting of blood or coffee ground material  New chest pain or pain under the shoulder blades  Painful or persistently difficult swallowing  New shortness of breath  Fever of 100F or higher  Black, tarry-looking stools  For urgent or emergent issues, a gastroenterologist can be reached at any hour by calling [phone]. Do not use MyChart messaging for urgent concerns.    DIET:  We do recommend a small meal at first, but then you may proceed to your regular diet.  Drink plenty of fluids but you should avoid  alcoholic beverages for 24 hours.  ACTIVITY:  You should plan to take it easy for the rest of today and you should NOT DRIVE or use heavy machinery until tomorrow (because of the sedation medicines used during the test).    FOLLOW UP: Our staff will call the number listed on your records the next business day following your procedure.  We will call around 7:15- 8:00 am to check on you and address any questions or concerns that you may have regarding the information given to you following your procedure. If we do not reach you, we will leave a message.     If any biopsies were taken you will be contacted by phone or by letter within the next 1-3 weeks.  Please call us at [phone] if you have not heard about the biopsies in 3 weeks.    SIGNATURES/CONFIDENTIALITY: You and/or your care partner have signed paperwork which will be entered into your electronic medical record.  These signatures attest to the fact that that the information above on your After Visit Summary has been reviewed and is understood.  Full responsibility of the confidentiality of this discharge information lies with you and/or your care-partner.

## 2023-03-03 NOTE — Progress Notes (Signed)
VS by EC  Pt's states no medical or surgical changes since previsit or office visit.  

## 2023-03-03 NOTE — Progress Notes (Signed)
GASTROENTEROLOGY PROCEDURE H&P NOTE   Primary Care Physician: Sandford Craze, NP    Reason for Procedure:   Epigastric pain, nausea/  Plan:    EGD  Patient is appropriate for endoscopic procedure(s) in the ambulatory ([facility]) setting.  The nature of the procedure, as well as the risks, benefits, and alternatives were carefully and thoroughly reviewed with the patient. Ample time for discussion and questions allowed. The patient understood, was satisfied, and agreed to proceed.     HPI: Elizabeth [name] is a 47 y.o. female who presents for EGD for evaluation of persistent, recurrent epigastric pain, nausea/vomiting after Roux-en-Y gastric bypass in 09/2022 and subsequent ex lap with LOA in 11/2022.  Patient was most recently seen in the Gastroenterology Clinic on 02/28/2023 by Mike Gip, PA-C.  She was referred for urgent upper endoscopy today.  No interval change in medical history since that appointment. Please refer to that note for full details regarding GI history and clinical presentation.   Past Medical History:  Diagnosis Date   Anemia    Anxiety    Astigmatism    Depression    Diabetes mellitus without complication (HCC)    Dry mouth    Fatigue    GERD (gastroesophageal reflux disease)    Heartburn    Hepatic steatosis    Hepatomegaly    High cholesterol    Hypertension    Keratoconjunctivitis    Liver disease    Muscle pain    Ovarian cyst    Positive ANA (antinuclear antibody)    Pulmonary nodules    Vitamin D deficiency    Weakness     Past Surgical History:  Procedure Laterality Date   BIOPSY  01/18/2021   Procedure: BIOPSY;  Surgeon: Shellia Cleverly, DO;  Location: [facility];  Service: Gastroenterology;;   CHOLECYSTECTOMY N/A 05/11/2021   Procedure: LAPAROSCOPIC CHOLECYSTECTOMY WITH INTRAOPERATIVE CHOLANGIOGRAM;  Surgeon: Quentin Ore, MD;  Location: [facility];  Service: General;  Laterality: N/A;   DILATION AND CURETTAGE OF UTERUS      ENDOMETRIAL BIOPSY  12/19/2017   normal per pt   ESOPHAGOGASTRODUODENOSCOPY (EGD) WITH PROPOFOL N/A 01/18/2021   Procedure: ESOPHAGOGASTRODUODENOSCOPY (EGD) WITH PROPOFOL;  Surgeon: Shellia Cleverly, DO;  Location: [facility];  Service: Gastroenterology;  Laterality: N/A;   HIATAL HERNIA REPAIR N/A 09/30/2022   Procedure: HERNIA REPAIR HIATAL;  Surgeon: Quentin Ore, MD;  Location: [facility];  Service: General;  Laterality: N/A;   LAPAROSCOPIC INSERTION GASTROSTOMY TUBE N/A 12/02/2022   Procedure: LAPAROSCOPIC REMNANT GASTROSTOMY G TUBE;  Surgeon: Quentin Ore, MD;  Location: [facility];  Service: General;  Laterality: N/A;   LAPAROSCOPIC ROUX-EN-Y GASTRIC BYPASS WITH HIATAL HERNIA REPAIR  09/30/2022   LAPAROSCOPY N/A 12/02/2022   Procedure: LAPAROSCOPY DIAGNOSTIC;  Surgeon: Quentin Ore, MD;  Location: [facility];  Service: General;  Laterality: N/A;   TONSILLECTOMY     TUBAL LIGATION     UPPER GI ENDOSCOPY N/A 09/30/2022   Procedure: UPPER GI ENDOSCOPY;  Surgeon: Quentin Ore, MD;  Location: [facility];  Service: General;  Laterality: N/A;   UPPER GI ENDOSCOPY N/A 12/02/2022   Procedure: UPPER ENDOSCOPY;  Surgeon: Quentin Ore, MD;  Location: [facility];  Service: General;  Laterality: N/A;    Prior to Admission medications   Medication Sig Start Date End Date Taking? Authorizing Provider  Blood Glucose Monitoring Suppl (FREESTYLE LITE) w/Device KIT Use to check blood sugar 4 times a day as directed 05/10/22  Yes Peggyann Juba,  Melissa, NP  CALCIUM-VITAMIN D PO Take 2 tablets by mouth every evening.   Yes [provider]  escitalopram (LEXAPRO) 20 MG tablet Take 1 tablet (20 mg total) by mouth daily. 02/21/23  Yes Sandford Craze, NP  glipiZIDE (GLIPIZIDE XL) 5 MG 24 hr tablet Take 1 tablet (5 mg total) by mouth every morning with food. 12/23/22  Yes   glucose blood (FREESTYLE LITE) test strip Use to check blood sugar 4 times a day as directed 10/20/22  Yes  Sandford Craze, NP  Lancets (FREESTYLE) lancets Use to check blood sugar 4 times a day as directed 05/10/22  Yes   lisinopril (ZESTRIL) 5 MG tablet Take 1 tablet (5 mg total) by mouth daily. 11/11/22  Yes Sandford Craze, NP  lovastatin (MEVACOR) 40 MG tablet TAKE 1 TABLET BY MOUTH AT BEDTIME. 11/11/22 11/11/23 Yes Sandford Craze, NP  methocarbamol (ROBAXIN) 500 MG tablet Take 1 tablet (500 mg total) by mouth 4 (four) times daily for 10 days. 02/20/23  Yes   Multiple Vitamin (MULTIVITAMIN WITH MINERALS) TABS tablet Take 2 tablets by mouth every evening.   Yes [provider]  omeprazole (PRILOSEC) 40 MG capsule Take 1 capsule (40 mg total) by mouth in the morning and at bedtime. Open and sprinkle with applesauce twice daily 02/28/23  Yes Esterwood, Amy S, PA-C  ondansetron (ZOFRAN-ODT) 4 MG disintegrating tablet Take 1 tablet (4 mg total) by mouth every 6 (six) hours as needed. 02/28/23  Yes Esterwood, Amy S, PA-C  traMADol (ULTRAM) 50 MG tablet Take 1 tablet (50 mg total) by mouth every 6 (six) hours as needed for up to 13 days. 02/28/23 03/13/23 Yes Esterwood, Amy S, PA-C  hydrOXYzine (ATARAX) 25 MG tablet Take 2 tablets by mouth at bedtime as needed for insomnia. 03/04/22 03/04/23  Sandford Craze, NP  traZODone (DESYREL) 50 MG tablet 1/2 to 1 tab by mouth at bedtime as needed for sleep 09/04/20 11/20/20  Sandford Craze, NP    Current Outpatient Medications  Medication Sig Dispense Refill   Blood Glucose Monitoring Suppl (FREESTYLE LITE) w/Device KIT Use to check blood sugar 4 times a day as directed 1 kit 0   CALCIUM-VITAMIN D PO Take 2 tablets by mouth every evening.     escitalopram (LEXAPRO) 20 MG tablet Take 1 tablet (20 mg total) by mouth daily. 90 tablet 0   glipiZIDE (GLIPIZIDE XL) 5 MG 24 hr tablet Take 1 tablet (5 mg total) by mouth every morning with food. 90 tablet 3   glucose blood (FREESTYLE LITE) test strip Use to check blood sugar 4 times a day as directed 100  each 5   Lancets (FREESTYLE) lancets Use to check blood sugar 4 times a day as directed 100 each 0   lisinopril (ZESTRIL) 5 MG tablet Take 1 tablet (5 mg total) by mouth daily. 90 tablet 1   lovastatin (MEVACOR) 40 MG tablet TAKE 1 TABLET BY MOUTH AT BEDTIME. 30 tablet 11   methocarbamol (ROBAXIN) 500 MG tablet Take 1 tablet (500 mg total) by mouth 4 (four) times daily for 10 days. 40 tablet 0   Multiple Vitamin (MULTIVITAMIN WITH MINERALS) TABS tablet Take 2 tablets by mouth every evening.     omeprazole (PRILOSEC) 40 MG capsule Take 1 capsule (40 mg total) by mouth in the morning and at bedtime. Open and sprinkle with applesauce twice daily 180 capsule 0   ondansetron (ZOFRAN-ODT) 4 MG disintegrating tablet Take 1 tablet (4 mg total) by mouth every 6 (six)  hours as needed. 20 tablet 3   traMADol (ULTRAM) 50 MG tablet Take 1 tablet (50 mg total) by mouth every 6 (six) hours as needed for up to 13 days. 50 tablet 0   hydrOXYzine (ATARAX) 25 MG tablet Take 2 tablets by mouth at bedtime as needed for insomnia. 60 tablet 3   Current Facility-Administered Medications  Medication Dose Route Frequency Provider Last Rate Last Admin   0.9 %  sodium chloride infusion  500 mL Intravenous Once Dejae Bernet V, DO       Facility-Administered Medications Ordered in Other Visits  Medication Dose Route Frequency Provider Last Rate Last Admin   sodium chloride 0.9 % 1,000 mL with thiamine 100 mg, folic acid 1 mg, M.V.I. Adult 10 mL infusion   Intravenous Once Stechschulte, Hyman Hopes, MD       sodium chloride 0.9 % 1,000 mL with thiamine 100 mg, folic acid 1 mg, M.V.I. Adult 10 mL infusion   Intravenous Once Stechschulte, Hyman Hopes, MD       sodium chloride 0.9 % bolus 1,000 mL  1,000 mL Intravenous Once Stechschulte, Hyman Hopes, MD       sodium chloride 0.9 % bolus 1,000 mL  1,000 mL Intravenous Once Stechschulte, Hyman Hopes, MD        Allergies as of 03/03/2023 - Review Complete 03/03/2023  Allergen Reaction Noted    Tomato Hives, Itching, and Rash 08/12/2022    Family History  Problem Relation Age of Onset   Diabetes Mother    Hypertension Mother    Hyperlipidemia Mother    Obesity Mother    Peripheral Artery Disease Mother    HIV Father        died from complications (IVDU)   Alcohol abuse Father    Drug abuse Father    Lupus Maternal Aunt    Ovarian cancer Maternal Grandmother    Colon cancer Neg Hx    Esophageal cancer Neg Hx    Liver disease Neg Hx    Pancreatic cancer Neg Hx    Stomach cancer Neg Hx    Sleep apnea Neg Hx    Rectal cancer Neg Hx     Social History   Socioeconomic History   Marital status: Widowed    Spouse name: Mellody Dance   Number of children: 2   Years of education: Not on file   Highest education level: Associate degree: occupational, Scientist, product/process development, or vocational program  Occupational History   Occupation: CMA - Geophysicist/field seismologist: [employer]  Tobacco Use   Smoking status: Former    Packs/day: 0.50    Years: 3.00    Additional pack years: 0.00    Total pack years: 1.50    Types: Cigarettes    Quit date: 08/21/2021    Years since quitting: 1.5    Passive exposure: Never   Smokeless tobacco: Never   Tobacco comments:    4-5/day  Vaping Use   Vaping Use: Never used  Substance and Sexual Activity   Alcohol use: No   Drug use: No   Sexual activity: Yes    Partners: Male    Birth control/protection: Surgical, Implant  Other Topics Concern   Not on file  Social History Narrative    2 children   1997- son Collier Bullock   2000- son Designer, jewellery in [place] [facility]- CMA in [place]   Enjoys reading   Widowed Jul 24, 2023, husband died from COVID-19.   Social Determinants of Health  Financial Resource Strain: Low Risk  (02/19/2023)   Overall Financial Resource Strain (CARDIA)    Difficulty of Paying Living Expenses: Not hard at all  Food Insecurity: No Food Insecurity (02/19/2023)   Hunger Vital Sign    Worried About Running Out of Food in the Last  Year: Never true    Ran Out of Food in the Last Year: Never true  Transportation Needs: No Transportation Needs (02/19/2023)   PRAPARE - Administrator, Civil Service (Medical): No    Lack of Transportation (Non-Medical): No  Physical Activity: Insufficiently Active (02/19/2023)   Exercise Vital Sign    Days of Exercise per Week: 4 days    Minutes of Exercise per Session: 10 min  Stress: No Stress Concern Present (02/19/2023)   [employer] of Occupational Health - Occupational Stress Questionnaire    Feeling of Stress : Only a little  Social Connections: Moderately Integrated (02/19/2023)   Social Connection and Isolation Panel [NHANES]    Frequency of Communication with Friends and Family: Three times a week    Frequency of Social Gatherings with Friends and Family: Once a week    Attends Religious Services: More than 4 times per year    Active Member of [employer] or Organizations: Yes    Attends Banker Meetings: More than 4 times per year    Marital Status: Widowed  Intimate Partner Violence: Not At Risk (12/02/2022)   Humiliation, Afraid, Rape, and Kick questionnaire    Fear of Current or Ex-Partner: No    Emotionally Abused: No    Physically Abused: No    Sexually Abused: No    Physical Exam: Vital signs in last 24 hours: @BP  (!) 142/76   Pulse 91   Temp 98.6 F (37 C)   Ht 5\' 3"  (1.6 m)   Wt 272 lb 9.6 oz (123.7 kg)   SpO2 99%   BMI 48.29 kg/m  GEN: NAD EYE: Sclerae anicteric ENT: MMM CV: Non-tachycardic Pulm: CTA b/l GI: Soft, NT/ND NEURO:  Alert & Oriented x 3   Doristine Locks, DO [facility]   03/03/2023 1:57 PM

## 2023-03-04 ENCOUNTER — Telehealth: Payer: Self-pay

## 2023-03-04 NOTE — Telephone Encounter (Signed)
  Follow up Call-     03/03/2023    1:27 PM  Call back number  Post procedure Call Back phone  # [phone]  Permission to leave phone message Yes     Patient questions:  Do you have a fever, pain , or abdominal swelling? No. Pain Score  0 *  Have you tolerated food without any problems? Yes.    Have you been able to return to your normal activities? Yes.    Do you have any questions about your discharge instructions: Diet   No. Medications  No. Follow up visit  No.  Do you have questions or concerns about your Care? No.  Actions: * If pain score is 4 or above: No action needed, pain <4.

## 2023-03-07 ENCOUNTER — Other Ambulatory Visit (INDEPENDENT_AMBULATORY_CARE_PROVIDER_SITE_OTHER): Payer: Medicaid Other

## 2023-03-07 DIAGNOSIS — E785 Hyperlipidemia, unspecified: Secondary | ICD-10-CM

## 2023-03-07 DIAGNOSIS — E1165 Type 2 diabetes mellitus with hyperglycemia: Secondary | ICD-10-CM

## 2023-03-07 LAB — COMPREHENSIVE METABOLIC PANEL
ALT: 100 U/L — ABNORMAL HIGH (ref 0–35)
AST: 56 U/L — ABNORMAL HIGH (ref 0–37)
Albumin: 3.9 g/dL (ref 3.5–5.2)
Alkaline Phosphatase: 141 U/L — ABNORMAL HIGH (ref 39–117)
BUN: 10 mg/dL (ref 6–23)
CO2: 27 mEq/L (ref 19–32)
Calcium: 9.2 mg/dL (ref 8.4–10.5)
Chloride: 101 mEq/L (ref 96–112)
Creatinine, Ser: 0.71 mg/dL (ref 0.40–1.20)
GFR: 103.18 mL/min (ref >60.00)
Glucose, Bld: 207 mg/dL — ABNORMAL HIGH (ref 70–99)
Potassium: 4.2 mEq/L (ref 3.5–5.1)
Sodium: 138 mEq/L (ref 135–145)
Total Bilirubin: 0.4 mg/dL (ref 0.2–1.2)
Total Protein: 6.9 g/dL (ref 6.0–8.3)

## 2023-03-07 LAB — LIPID PANEL
Cholesterol: 172 mg/dL (ref 0–200)
HDL: 52.8 mg/dL (ref >39.00)
LDL Cholesterol: 93 mg/dL (ref 0–99)
NonHDL: 118.75
Total CHOL/HDL Ratio: 3
Triglycerides: 130 mg/dL (ref 0.0–149.0)
VLDL: 26 mg/dL (ref 0.0–40.0)

## 2023-03-07 LAB — HEMOGLOBIN A1C: Hgb A1c MFr Bld: 9.1 % — ABNORMAL HIGH (ref 4.6–6.5)

## 2023-03-10 ENCOUNTER — Encounter: Payer: Self-pay | Admitting: Family

## 2023-03-10 ENCOUNTER — Telehealth: Payer: Self-pay | Admitting: Family

## 2023-03-10 DIAGNOSIS — E1165 Type 2 diabetes mellitus with hyperglycemia: Secondary | ICD-10-CM

## 2023-03-10 MED ORDER — SITAGLIPTIN PHOSPHATE 100 MG PO TABS: 100.0000 mg | ORAL_TABLET | Freq: Every day | ORAL | 5 refills | Status: DC

## 2023-03-10 NOTE — Telephone Encounter (Signed)
Created account on [employer] website to try to download documents but was not able to figure out how.  The forms had an option for faxing to [phone], forms faxed

## 2023-03-10 NOTE — Telephone Encounter (Signed)
Reviewed prior auth notes from [facility] in regards to CT chest.  They are requesting additional paperwork. Can you please send them a copy of the CT angio chest from 11/24/22?  I left the paperwork on your desk. tks

## 2023-03-10 NOTE — Telephone Encounter (Signed)
Please advise pt that her A1C has risen significantly up to 9.1.   I would like her to add [place] 100mg  once daily.   Is she still using G-tube feeds? If so, what type of tube feed is she using?

## 2023-03-10 NOTE — Telephone Encounter (Signed)
Patient notified of results and new prescription.  She is using the Gtube, and  reports is for formula only.

## 2023-03-11 ENCOUNTER — Other Ambulatory Visit: Payer: Self-pay | Admitting: Physician Assistant

## 2023-03-11 IMAGING — NM NM HEPATO W/GB/PHARM/[PERSON_NAME]
2 series · 12 of 12 positions shown · non-contrast
Comparison: Abdominal ultrasound December 28, 2020

CLINICAL DATA: Right upper quadrant abdominal pain.

EXAM:
NUCLEAR MEDICINE HEPATOBILIARY IMAGING WITH GALLBLADDER EF
TECHNIQUE: Sequential images of the abdomen were obtained [DATE] minutes
following intravenous administration of radiopharmaceutical. After
oral ingestion of Ensure, gallbladder ejection fraction was
determined. At 60 min, normal ejection fraction is greater than 33%.
RADIOPHARMACEUTICALS:  5.4 mCi 6c-JJm  Choletec IV

[Series 1: hida scan · 3.28mm/px · 6 of 60 frames shown]
[frame 6/60]
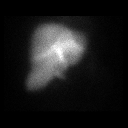
[frame 16/60]
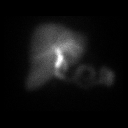
[frame 26/60]
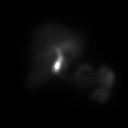
[frame 36/60]
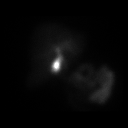
[frame 46/60]
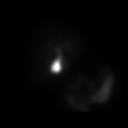
[frame 56/60]
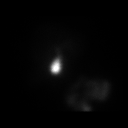

[Series 1: gbef ensure · 3.28mm/px · 6 of 60 frames shown]
[frame 6/60]
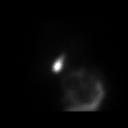
[frame 16/60]
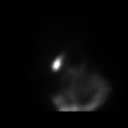
[frame 26/60]
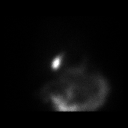
[frame 36/60]
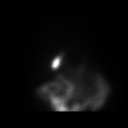
[frame 46/60]
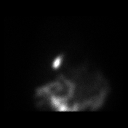
[frame 56/60]
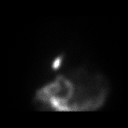

[12 of 12 positions shown; findings below may reference images not displayed]

FINDINGS: Prompt uptake and biliary excretion of activity by the liver is
seen. Gallbladder activity is visualized, consistent with patency of
cystic duct. Biliary activity passes into small bowel, consistent
with patent common bile duct.

Calculated gallbladder ejection fraction is 54%. (Normal gallbladder
ejection fraction with Ensure is greater than 33%.)
IMPRESSION: Normal gallbladder ejection fraction.

## 2023-03-18 ENCOUNTER — Other Ambulatory Visit: Payer: Self-pay | Admitting: Family

## 2023-03-18 NOTE — Telephone Encounter (Signed)
Amy see the message per the pt in regards to refill

## 2023-03-19 ENCOUNTER — Encounter (HOSPITAL_BASED_OUTPATIENT_CLINIC_OR_DEPARTMENT_OTHER): Payer: Self-pay

## 2023-03-19 ENCOUNTER — Ambulatory Visit (HOSPITAL_BASED_OUTPATIENT_CLINIC_OR_DEPARTMENT_OTHER): Payer: Medicaid Other | Attending: Family

## 2023-03-21 ENCOUNTER — Other Ambulatory Visit: Payer: Self-pay | Admitting: Physician Assistant

## 2023-03-21 MED ORDER — TRAMADOL HCL 50 MG PO TABS: 50.0000 mg | ORAL_TABLET | Freq: Four times a day (QID) | ORAL | 0 refills | Status: DC | PRN

## 2023-03-23 ENCOUNTER — Encounter: Payer: Self-pay | Admitting: Gastroenterology

## 2023-03-24 MED ORDER — RABEPRAZOLE SODIUM 20 MG PO TBEC: 20.0000 mg | DELAYED_RELEASE_TABLET | Freq: Two times a day (BID) | ORAL | 0 refills | Status: DC

## 2023-03-24 MED ORDER — RABEPRAZOLE SODIUM 20 MG PO TBEC: 20.0000 mg | DELAYED_RELEASE_TABLET | Freq: Every day | ORAL | 3 refills | Status: DC

## 2023-03-24 NOTE — Telephone Encounter (Signed)
Please initiate PA for Aciphex. Thanks

## 2023-03-24 NOTE — Addendum Note (Signed)
Addended by: Missy Sabins on: 03/24/2023 11:43 AM   Modules accepted: Orders

## 2023-03-25 ENCOUNTER — Other Ambulatory Visit (HOSPITAL_COMMUNITY): Payer: Self-pay

## 2023-03-25 ENCOUNTER — Other Ambulatory Visit: Payer: Self-pay | Admitting: Physician Assistant

## 2023-03-27 ENCOUNTER — Telehealth: Payer: Self-pay | Admitting: Pharmacy Technician

## 2023-03-27 NOTE — Telephone Encounter (Signed)
Pa submitted, see additional encounter for updates.

## 2023-03-27 NOTE — Telephone Encounter (Signed)
Patient Advocate Encounter  Received notification from [facility] that prior authorization for RABEPRAZOLE 20MG  is required.   PA submitted on 6.6.24 Key [id number] Status is pending

## 2023-03-31 ENCOUNTER — Encounter: Payer: Self-pay | Admitting: Gastroenterology

## 2023-04-01 ENCOUNTER — Ambulatory Visit (HOSPITAL_BASED_OUTPATIENT_CLINIC_OR_DEPARTMENT_OTHER)
Admission: RE | Admit: 2023-04-01 | Discharge: 2023-04-01 | Disposition: A | Discharge status: Home / Self Care | Payer: Medicaid Other | Source: Ambulatory Visit | Attending: Family | Admitting: Family

## 2023-04-01 DIAGNOSIS — R918 Other nonspecific abnormal finding of lung field: Secondary | ICD-10-CM | POA: Diagnosis not present

## 2023-04-02 ENCOUNTER — Other Ambulatory Visit (HOSPITAL_COMMUNITY): Payer: Self-pay

## 2023-04-02 NOTE — Telephone Encounter (Signed)
Patient Advocate Encounter  Prior Authorization for RABEPRAZOLE 20MG  has been approved with [facility].    PA# [id number] Effective dates: 6.12.24 through 6.12.25  Per [facility] test claim, UNABLE TO PROVIDE COPAY AMOUNT.

## 2023-04-02 NOTE — Telephone Encounter (Signed)
Patient had a change in insurance. New PA was submitted with [facility]  Patient Advocate Encounter  Received notification from [facility] that prior authorization for RABEPRAZOLE 20MG  is required.   PA submitted on 6.12.24 Key [facility] Status is pending

## 2023-04-07 ENCOUNTER — Ambulatory Visit: Payer: Medicaid Other | Admitting: Family

## 2023-04-08 NOTE — Telephone Encounter (Signed)
Called [facility] [phone]) and had to leave a message for the new patient coordinator Katie (ext. [phone]).

## 2023-04-11 ENCOUNTER — Telehealth: Payer: Self-pay | Admitting: Gastroenterology

## 2023-04-11 ENCOUNTER — Other Ambulatory Visit (HOSPITAL_COMMUNITY): Payer: Self-pay

## 2023-04-11 ENCOUNTER — Telehealth (INDEPENDENT_AMBULATORY_CARE_PROVIDER_SITE_OTHER): Payer: Medicaid Other | Admitting: Family

## 2023-04-11 ENCOUNTER — Other Ambulatory Visit (HOSPITAL_BASED_OUTPATIENT_CLINIC_OR_DEPARTMENT_OTHER): Payer: Self-pay

## 2023-04-11 ENCOUNTER — Ambulatory Visit: Payer: Commercial Managed Care - PPO | Admitting: Family

## 2023-04-11 VITALS — BP 122/85 | HR 83 | Wt 267.0 lb

## 2023-04-11 DIAGNOSIS — F32A Depression, unspecified: Secondary | ICD-10-CM

## 2023-04-11 DIAGNOSIS — K2950 Unspecified chronic gastritis without bleeding: Secondary | ICD-10-CM | POA: Diagnosis not present

## 2023-04-11 DIAGNOSIS — R109 Unspecified abdominal pain: Secondary | ICD-10-CM | POA: Diagnosis not present

## 2023-04-11 DIAGNOSIS — G8929 Other chronic pain: Secondary | ICD-10-CM

## 2023-04-11 DIAGNOSIS — E1165 Type 2 diabetes mellitus with hyperglycemia: Secondary | ICD-10-CM | POA: Diagnosis not present

## 2023-04-11 MED ORDER — FAMOTIDINE 20 MG PO TABS: 20.0000 mg | ORAL_TABLET | Freq: Two times a day (BID) | ORAL | 2 refills | Status: DC

## 2023-04-11 NOTE — Patient Instructions (Addendum)
  During your recent visit, we discussed your ongoing abdominal pain and your diabetes. Despite previous treatments, your abdominal pain persists. We also noted that your blood sugar levels have been dropping, which is likely due to changes in your diet.  YOUR PLAN:  -CHRONIC ABDOMINAL PAIN: Your stomach lining is inflamed, a condition known as gastritis. We will add Pepcid to your treatment to help control stomach acid. We will also refer you to a pain management specialist for further evaluation and treatment.  -TYPE 2 DIABETES MELLITUS: Your blood sugar levels have been dropping too low, a condition known as hypoglycemia. We will discontinue Glipizide, a medication that lowers blood sugar, due to these low blood sugar levels and your decreased food intake.  -DEPRESSION- continue lexapro at 20mg  as this dose seems to be helping you.  INSTRUCTIONS:  Please follow up in three months. In the meantime, start taking Pepcid for your stomach pain and stop taking Glipizide for your diabetes. If you have any concerns or if your symptoms worsen, please contact the office immediately.

## 2023-04-11 NOTE — Assessment & Plan Note (Signed)
Improved on increased dose of lexapro 20mg . Continue same.

## 2023-04-11 NOTE — Assessment & Plan Note (Signed)
Lab Results  Component Value Date   HGBA1C 9.1 (H) 03/07/2023   HGBA1C 7.4 (H) 12/02/2022   HGBA1C 10.7 (H) 09/16/2022   Lab Results  Component Value Date   MICROALBUR 4.8 (H) 09/20/2022   LDLCALC 93 03/07/2023   CREATININE 0.71 03/07/2023   Hypoglycemic episodes noted with blood glucose dropping to mid 50s. Patient no longer on tube feeds which were previously elevating blood glucose. Currently on Januvia. -Discontinue Glipizide due to hypoglycemia and decreased oral intake.

## 2023-04-11 NOTE — Assessment & Plan Note (Signed)
Gastritis noted on endoscopy, negative H. pylori. Aciphex initiated by GI specialist with limited improvement. Carafate discontinued due to lack of efficacy. -Refer to pain management for further evaluation and treatment.

## 2023-04-11 NOTE — Telephone Encounter (Signed)
Spoke to patient regarding Disability Claim Form. Patient is aware provider will be unable to complete disability form. Provider provided recommendations of treatment for patient.

## 2023-04-11 NOTE — Progress Notes (Signed)
MyChart Video Visit    Virtual Visit via Video Note    Patient location: Home. Patient and provider in visit Provider location: Office  I discussed the limitations of evaluation and management by telemedicine and the availability of in person appointments. The patient expressed understanding and agreed to proceed.  Visit Date: 04/11/2023  Today's healthcare provider: Lemont Fillers, NP     Subjective:    Patient ID: Elizabeth [name], female    DOB: 06-18-1976, 47 y.o.   MRN: [id number]  Chief Complaint  Patient presents with   Depression    Follow up after medication dose increase   Diabetes    Follow up    HPI  The patient, with a history of depression, presents for a follow-up visit.She reports significant improvement in her mood with the increase in her lexapro from 10mg  to 20 mg.  She has been experiencing chronic abdominal pain and has been under the care of a GI specialist. Despite an endoscopy revealing gastritis with negative H. pylori, the patient's abdominal pain persists. The patient was previously on Carafate, which was discontinued due to lack of efficacy. Her current medication, omeprazole, was switched to Aciphex by the GI specialist. She notes no improvement since this change was made. The patient is also dealing with diabetes. She reports experiencing hypoglycemia, with blood sugar levels dropping to the mid 50s. This change is attributed to the discontinuation of tube feeds that were initially raising her blood glucose levels. The patient is currently on Januvia for diabetes management.  Past Medical History:  Diagnosis Date   Anemia    Anxiety    Astigmatism    Depression    Diabetes mellitus without complication (HCC)    Dry mouth    Fatigue    GERD (gastroesophageal reflux disease)    Heartburn    Hepatic steatosis    Hepatomegaly    High cholesterol    Hypertension    Keratoconjunctivitis    Liver disease    Muscle pain    Ovarian  cyst    Positive ANA (antinuclear antibody)    Pulmonary nodules    Vitamin D deficiency    Weakness     Past Surgical History:  Procedure Laterality Date   BIOPSY  01/18/2021   Procedure: BIOPSY;  Surgeon: Shellia Cleverly, DO;  Location: [facility];  Service: Gastroenterology;;   CHOLECYSTECTOMY N/A 05/11/2021   Procedure: LAPAROSCOPIC CHOLECYSTECTOMY WITH INTRAOPERATIVE CHOLANGIOGRAM;  Surgeon: Quentin Ore, MD;  Location: [facility];  Service: General;  Laterality: N/A;   DILATION AND CURETTAGE OF UTERUS     ENDOMETRIAL BIOPSY  12/19/2017   normal per pt   ESOPHAGOGASTRODUODENOSCOPY (EGD) WITH PROPOFOL N/A 01/18/2021   Procedure: ESOPHAGOGASTRODUODENOSCOPY (EGD) WITH PROPOFOL;  Surgeon: Shellia Cleverly, DO;  Location: [facility];  Service: Gastroenterology;  Laterality: N/A;   HIATAL HERNIA REPAIR N/A 09/30/2022   Procedure: HERNIA REPAIR HIATAL;  Surgeon: Quentin Ore, MD;  Location: [facility];  Service: General;  Laterality: N/A;   LAPAROSCOPIC INSERTION GASTROSTOMY TUBE N/A 12/02/2022   Procedure: LAPAROSCOPIC REMNANT GASTROSTOMY G TUBE;  Surgeon: Quentin Ore, MD;  Location: [facility];  Service: General;  Laterality: N/A;   LAPAROSCOPIC ROUX-EN-Y GASTRIC BYPASS WITH HIATAL HERNIA REPAIR  09/30/2022   LAPAROSCOPY N/A 12/02/2022   Procedure: LAPAROSCOPY DIAGNOSTIC;  Surgeon: Quentin Ore, MD;  Location: [facility];  Service: General;  Laterality: N/A;   TONSILLECTOMY     TUBAL LIGATION     UPPER  GI ENDOSCOPY N/A 09/30/2022   Procedure: UPPER GI ENDOSCOPY;  Surgeon: Quentin Ore, MD;  Location: [facility];  Service: General;  Laterality: N/A;   UPPER GI ENDOSCOPY N/A 12/02/2022   Procedure: UPPER ENDOSCOPY;  Surgeon: Quentin Ore, MD;  Location: [facility];  Service: General;  Laterality: N/A;    Family History  Problem Relation Age of Onset   Diabetes Mother    Hypertension Mother    Hyperlipidemia Mother    Obesity Mother    Peripheral  Artery Disease Mother    HIV Father        died from complications (IVDU)   Alcohol abuse Father    Drug abuse Father    Lupus Maternal Aunt    Ovarian cancer Maternal Grandmother    Colon cancer Neg Hx    Esophageal cancer Neg Hx    Liver disease Neg Hx    Pancreatic cancer Neg Hx    Stomach cancer Neg Hx    Sleep apnea Neg Hx    Rectal cancer Neg Hx     Social History   Socioeconomic History   Marital status: Widowed    Spouse name: Mellody Dance   Number of children: 2   Years of education: Not on file   Highest education level: Associate degree: occupational, Scientist, product/process development, or vocational program  Occupational History   Occupation: CMA - Geophysicist/field seismologist: [employer]  Tobacco Use   Smoking status: Former    Packs/day: 0.50    Years: 3.00    Additional pack years: 0.00    Total pack years: 1.50    Types: Cigarettes    Quit date: 08/21/2021    Years since quitting: 1.6    Passive exposure: Never   Smokeless tobacco: Never   Tobacco comments:    4-5/day  Vaping Use   Vaping Use: Never used  Substance and Sexual Activity   Alcohol use: No   Drug use: No   Sexual activity: Yes    Partners: Male    Birth control/protection: Surgical, Implant  Other Topics Concern   Not on file  Social History Narrative    2 children   1997- son Collier Bullock   2000- son Designer, jewellery in [place] [facility]- CMA in [place]   Enjoys reading   Widowed 07-26-23, husband died from COVID-19.   Social Determinants of Health   Financial Resource Strain: Low Risk  (02/19/2023)   Overall Financial Resource Strain (CARDIA)    Difficulty of Paying Living Expenses: Not hard at all  Food Insecurity: No Food Insecurity (02/19/2023)   Hunger Vital Sign    Worried About Running Out of Food in the Last Year: Never true    Ran Out of Food in the Last Year: Never true  Transportation Needs: No Transportation Needs (02/19/2023)   PRAPARE - Administrator, Civil Service (Medical): No     Lack of Transportation (Non-Medical): No  Physical Activity: Insufficiently Active (02/19/2023)   Exercise Vital Sign    Days of Exercise per Week: 4 days    Minutes of Exercise per Session: 10 min  Stress: No Stress Concern Present (02/19/2023)   [employer] of Occupational Health - Occupational Stress Questionnaire    Feeling of Stress : Only a little  Social Connections: Moderately Integrated (02/19/2023)   Social Connection and Isolation Panel [NHANES]    Frequency of Communication with Friends and Family: Three times a week    Frequency of Social Gatherings  with Friends and Family: Once a week    Attends Religious Services: More than 4 times per year    Active Member of Clubs or Organizations: Yes    Attends Banker Meetings: More than 4 times per year    Marital Status: Widowed  Intimate Partner Violence: Not At Risk (12/02/2022)   Humiliation, Afraid, Rape, and Kick questionnaire    Fear of Current or Ex-Partner: No    Emotionally Abused: No    Physically Abused: No    Sexually Abused: No    Outpatient Medications Prior to Visit  Medication Sig Dispense Refill   traMADol (ULTRAM) 50 MG tablet Take 1 tablet (50 mg total) by mouth every 6 (six) hours as needed. 30 tablet 0   Blood Glucose Monitoring Suppl (FREESTYLE LITE) w/Device KIT Use to check blood sugar 4 times a day as directed 1 kit 0   CALCIUM-VITAMIN D PO Take 2 tablets by mouth every evening.     escitalopram (LEXAPRO) 20 MG tablet Take 1 tablet (20 mg total) by mouth daily. 90 tablet 0   glucose blood (FREESTYLE LITE) test strip Use to check blood sugar 4 times a day as directed 100 each 5   Lancets (FREESTYLE) lancets Use to check blood sugar 4 times a day as directed 100 each 0   lisinopril (ZESTRIL) 5 MG tablet Take 1 tablet (5 mg total) by mouth daily. 90 tablet 1   lovastatin (MEVACOR) 40 MG tablet TAKE 1 TABLET BY MOUTH AT BEDTIME. 30 tablet 11   methocarbamol (ROBAXIN) 500 MG tablet Take 1 tablet  (500 mg total) by mouth 4 (four) times daily for 10 days. 40 tablet 0   Multiple Vitamin (MULTIVITAMIN WITH MINERALS) TABS tablet Take 2 tablets by mouth every evening.     ondansetron (ZOFRAN-ODT) 4 MG disintegrating tablet DISSOLVE 1 TABLET(4 MG) ON THE TONGUE EVERY 6 HOURS AS NEEDED 20 tablet 2   RABEprazole (ACIPHEX) 20 MG tablet Take 1 tablet (20 mg total) by mouth daily. 90 tablet 3   sitaGLIPtin (JANUVIA) 100 MG tablet Take 1 tablet (100 mg total) by mouth daily. 30 tablet 5   glipiZIDE (GLIPIZIDE XL) 5 MG 24 hr tablet Take 1 tablet (5 mg total) by mouth every morning with food. 90 tablet 3   omeprazole (PRILOSEC) 40 MG capsule Take 1 capsule (40 mg total) by mouth in the morning and at bedtime. Open and sprinkle with applesauce twice daily 180 capsule 0   RABEprazole (ACIPHEX) 20 MG tablet Take 1 tablet (20 mg total) by mouth in the morning and at bedtime. 112 tablet 0   Facility-Administered Medications Prior to Visit  Medication Dose Route Frequency Provider Last Rate Last Admin   sodium chloride 0.9 % 1,000 mL with thiamine 100 mg, folic acid 1 mg, M.V.I. Adult 10 mL infusion   Intravenous Once Stechschulte, Hyman Hopes, MD       sodium chloride 0.9 % 1,000 mL with thiamine 100 mg, folic acid 1 mg, M.V.I. Adult 10 mL infusion   Intravenous Once Stechschulte, Hyman Hopes, MD       sodium chloride 0.9 % bolus 1,000 mL  1,000 mL Intravenous Once Stechschulte, Hyman Hopes, MD       sodium chloride 0.9 % bolus 1,000 mL  1,000 mL Intravenous Once Stechschulte, Hyman Hopes, MD        Allergies  Allergen Reactions   Tomato Hives, Itching and Rash    ROS See HPI     Objective:  Physical Exam  BP 122/85 (BP Location: Right Arm)   Pulse 83   Wt 267 lb (121.1 kg)   LMP 03/29/2023   BMI 47.30 kg/m  Wt Readings from Last 3 Encounters:  04/11/23 267 lb (121.1 kg)  03/03/23 272 lb 9.6 oz (123.7 kg)  02/28/23 271 lb (122.9 kg)   Gen: Awake, alert, no acute distress Resp: Breathing is even and  non-labored Psych: calm/pleasant demeanor Neuro: Alert and Oriented x 3, + facial symmetry, speech is clear.    Assessment & Plan:   Problem List Items Addressed This Visit       Unprioritized   Uncontrolled type 2 diabetes mellitus with hyperglycemia (HCC)    Lab Results  Component Value Date   HGBA1C 9.1 (H) 03/07/2023   HGBA1C 7.4 (H) 12/02/2022   HGBA1C 10.7 (H) 09/16/2022   Lab Results  Component Value Date   MICROALBUR 4.8 (H) 09/20/2022   LDLCALC 93 03/07/2023   CREATININE 0.71 03/07/2023  Hypoglycemic episodes noted with blood glucose dropping to mid 50s. Patient no longer on tube feeds which were previously elevating blood glucose. Currently on Januvia. -Discontinue Glipizide due to hypoglycemia and decreased oral intake.      Depression    Improved on increased dose of lexapro 20mg . Continue same.       Chronic gastritis without bleeding    Continue aciphex, add pepcid 20mg  bid.       Chronic abdominal pain - Primary    Gastritis noted on endoscopy, negative H. pylori. Aciphex initiated by GI specialist with limited improvement. Carafate discontinued due to lack of efficacy. -Refer to pain management for further evaluation and treatment.      Relevant Orders   Ambulatory referral to Pain Clinic     I have discontinued Loella Orton's glipiZIDE and omeprazole. I am also having her start on famotidine. Additionally, I am having her maintain her FreeStyle Lite, freestyle, FREESTYLE LITE, lovastatin, lisinopril, multivitamin with minerals, CALCIUM-VITAMIN D PO, methocarbamol, escitalopram, sitaGLIPtin, traMADol, RABEprazole, and ondansetron.  Meds ordered this encounter  Medications   famotidine (PEPCID) 20 MG tablet    Sig: Take 1 tablet (20 mg total) by mouth 2 (two) times daily.    Dispense:  60 tablet    Refill:  2    Order Specific Question:   Supervising Provider    Answer:   Danise Edge A [[id number]]    I discussed the assessment and treatment plan  with the patient. The patient was provided an opportunity to ask questions and all were answered. The patient agreed with the plan and demonstrated an understanding of the instructions.   The patient was advised to call back or seek an in-person evaluation if the symptoms worsen or if the condition fails to improve as anticipated.     Lemont Fillers, NP [facility] at [facility] [phone] (phone) [phone] (fax)  [facility]

## 2023-04-11 NOTE — Assessment & Plan Note (Signed)
Continue aciphex, add pepcid 20mg  bid.

## 2023-04-14 ENCOUNTER — Encounter: Payer: Self-pay | Admitting: Family

## 2023-04-14 ENCOUNTER — Telehealth: Payer: Self-pay

## 2023-04-14 NOTE — Telephone Encounter (Signed)
[employer] called stating that they needed any chart notes to be faxed over to support coverage for this medication. Fax # is as follows:   [phone]

## 2023-04-14 NOTE — Telephone Encounter (Signed)
Appeal faxed to [facility] at [phone]. Awaiting determination.

## 2023-04-14 NOTE — Telephone Encounter (Signed)
Looking back Pt has tried and failed Elizabeth [name]- will appeal.

## 2023-04-14 NOTE — Telephone Encounter (Signed)
PA initiated via Covermymeds; KEY: [id number]. Awaiting determination.

## 2023-04-14 NOTE — Telephone Encounter (Signed)
PA denied.   Requires trial and failure or insufficient response to metformin containing products unless contraindicated  or documented adverse event.

## 2023-04-15 NOTE — Telephone Encounter (Signed)
OV notes faxed to number below

## 2023-04-16 ENCOUNTER — Encounter: Payer: Self-pay | Admitting: Family

## 2023-04-16 NOTE — Telephone Encounter (Signed)
Appeal approved for Januvia. Effective 04/14/23 to 04/14/24.

## 2023-04-21 ENCOUNTER — Telehealth: Payer: Self-pay

## 2023-04-21 ENCOUNTER — Encounter: Payer: Self-pay | Admitting: Family

## 2023-04-21 ENCOUNTER — Ambulatory Visit (INDEPENDENT_AMBULATORY_CARE_PROVIDER_SITE_OTHER): Payer: Medicaid Other | Admitting: Family

## 2023-04-21 VITALS — BP 121/86 | HR 95 | Ht 63.0 in | Wt 271.8 lb

## 2023-04-21 DIAGNOSIS — G8929 Other chronic pain: Secondary | ICD-10-CM | POA: Diagnosis not present

## 2023-04-21 DIAGNOSIS — R109 Unspecified abdominal pain: Secondary | ICD-10-CM

## 2023-04-21 DIAGNOSIS — R112 Nausea with vomiting, unspecified: Secondary | ICD-10-CM

## 2023-04-21 HISTORY — DX: Nausea with vomiting, unspecified: R11.2

## 2023-04-21 MED ORDER — TRAMADOL HCL 50 MG PO TABS: 50.0000 mg | ORAL_TABLET | Freq: Three times a day (TID) | ORAL | 0 refills | Status: DC | PRN

## 2023-04-21 NOTE — Assessment & Plan Note (Signed)
Severe, intermittent, postprandial pain in the setting of recent gastric bypass and hiatal hernia repair. Pain is impacting quality of life. Unremarkable EGD, CT abdomen, and gastric emptying study. Patient is scheduled to see pain management. -Refill Tramadol, up to 3 times a day as needed for pain. Total of 30 tablets to last until pain management appointment. -Signed pain contract with patient.  

## 2023-04-21 NOTE — Telephone Encounter (Signed)
Will send OV note for appeal to [facility] at [phone]. Awaiting appeal determination.

## 2023-04-21 NOTE — Patient Instructions (Signed)
VISIT SUMMARY:  During your visit, we discussed your ongoing abdominal pain and nausea/vomiting, which have been persistent since your gastric bypass and hiatal hernia repair surgery. Despite normal results from your stomach and abdomen scans, your symptoms continue. We have planned for you to see a pain management specialist to help manage your discomfort.  YOUR PLAN:  -CHRONIC ABDOMINAL PAIN: Your abdominal pain is likely related to your recent surgeries. We will continue your current pain medication, Tramadol, to help manage this. You will take up to 3 tablets a day as needed for pain.  -NAUSEA/VOMITING: Your nausea and vomiting are likely due to reflux and/or pain. You should continue taking Aciphex for reflux and Zofran as needed for nausea.  INSTRUCTIONS:  You have an appointment with a pain management specialist on May 01, 2023. Please remember to provide a urine sample today before you leave.

## 2023-04-21 NOTE — Assessment & Plan Note (Signed)
Severe, intermittent, postprandial pain in the setting of recent gastric bypass and hiatal hernia repair. Pain is impacting quality of life. Unremarkable EGD, CT abdomen, and gastric emptying study. Patient is scheduled to see pain management. -Refill Tramadol, up to 3 times a day as needed for pain. Total of 30 tablets to last until pain management appointment. -Signed pain contract with patient.

## 2023-04-21 NOTE — Telephone Encounter (Signed)
PA initiated via Covermymeds; KEY:  [id number]. Awaiting determination.

## 2023-04-21 NOTE — Telephone Encounter (Signed)
PA denied.   The following criteria from the health plan guideline, Opioid Analgesics, must be met before we can approve  this request. Please send [name] supporting chart notes and lab results. - The prescribing clinician shall review the [facility] statement on use of controlled  substances for the treatment of pain (https://www.ncmedboard.org/resources information/professionalresources/laws-rules-positionstatements/position statements/Policy_for_the_use_of_opiates_for_the_treatment_of_pain), and is  adhering as medically appropriate to the guidelines which include: (a) complete beneficiary evaluation, (b)  establishment of a treatment plan (contract), (c) informed consent,(d) periodic review, and (e) consultation  with specialists in various treatment modalities as appropriate. - The prescribing clinician shall check the beneficiary's utilization of controlled substances on the [place]  Controlled Substance Reporting System. (https://[place].HybridDrinks.uy). - The prescribing clinician shall review the CDC Guideline for Prescribing Opioids for Chronic Pain -- [place], 2016. ( https://sampson.info/.htm)

## 2023-04-21 NOTE — Progress Notes (Signed)
Subjective:     Patient ID: Elizabeth [name], female    DOB: 1976/04/26, 47 y.o.   MRN: [id number]  Chief Complaint  Patient presents with   Abdominal Pain    Onset 10/2022  Pain across diaphragm and back     Abdominal Pain    Discussed the use of AI scribe software for clinical note transcription with the patient, who gave verbal consent to proceed.  History of Present Illness   The patient, a 47 year old with a history of robotic gastric bypass with hiatal hernia repair, presents with chronic abdominal pain. The surgery was complicated by postoperative nausea, vomiting, and abdominal pain. Despite an unremarkable EGD and CT abdomen, the patient's symptoms persisted, leading to G tube placement.  G tube has since been removed. The patient reports that the pain is located across the abdomen and radiates to the back. The pain is not constant but is most prominent in the mornings and after eating. The patient also reports that the pain is now triggered by any type of food and certain liquids, particularly cold water. Accompanying the pain are nausea and vomiting, which the patient believes may be due to reflux. The patient is currently taking Aciphex for reflux and Zofran for nausea. The patient rates the current pain level as a five out of ten. The patient has been taking tramadol for pain management, which has been helpful. She has an upcoming appointment to establish with the [facility] on 05/01/23.      Wt Readings from Last 3 Encounters:  04/21/23 271 lb 12.8 oz (123.3 kg)  04/11/23 267 lb (121.1 kg)  03/03/23 272 lb 9.6 oz (123.7 kg)       Health Maintenance Due  Topic Date Due   COVID-19 Vaccine (4 - 2023-24 season) 06/21/2022   OPHTHALMOLOGY EXAM  12/12/2022    Past Medical History:  Diagnosis Date   Anemia    Anxiety    Astigmatism    Depression    Diabetes mellitus without complication (HCC)    Dry mouth    Fatigue    GERD (gastroesophageal reflux  disease)    Heartburn    Hepatic steatosis    Hepatomegaly    High cholesterol    Hypertension    Keratoconjunctivitis    Liver disease    Muscle pain    Nausea and vomiting 04/21/2023   Ovarian cyst    Positive ANA (antinuclear antibody)    Pulmonary nodules    Vitamin D deficiency    Weakness     Past Surgical History:  Procedure Laterality Date   BIOPSY  01/18/2021   Procedure: BIOPSY;  Surgeon: Shellia Cleverly, DO;  Location: [facility];  Service: Gastroenterology;;   CHOLECYSTECTOMY N/A 05/11/2021   Procedure: LAPAROSCOPIC CHOLECYSTECTOMY WITH INTRAOPERATIVE CHOLANGIOGRAM;  Surgeon: Quentin Ore, MD;  Location: [facility];  Service: General;  Laterality: N/A;   DILATION AND CURETTAGE OF UTERUS     ENDOMETRIAL BIOPSY  12/19/2017   normal per pt   ESOPHAGOGASTRODUODENOSCOPY (EGD) WITH PROPOFOL N/A 01/18/2021   Procedure: ESOPHAGOGASTRODUODENOSCOPY (EGD) WITH PROPOFOL;  Surgeon: Shellia Cleverly, DO;  Location: [facility];  Service: Gastroenterology;  Laterality: N/A;   HIATAL HERNIA REPAIR N/A 09/30/2022   Procedure: HERNIA REPAIR HIATAL;  Surgeon: Quentin Ore, MD;  Location: [facility];  Service: General;  Laterality: N/A;   LAPAROSCOPIC INSERTION GASTROSTOMY TUBE N/A 12/02/2022   Procedure: LAPAROSCOPIC REMNANT GASTROSTOMY G TUBE;  Surgeon: Quentin Ore, MD;  Location: [facility];  Service: General;  Laterality: N/A;   LAPAROSCOPIC ROUX-EN-Y GASTRIC BYPASS WITH HIATAL HERNIA REPAIR  09/30/2022   LAPAROSCOPY N/A 12/02/2022   Procedure: LAPAROSCOPY DIAGNOSTIC;  Surgeon: Quentin Ore, MD;  Location: [facility];  Service: General;  Laterality: N/A;   TONSILLECTOMY     TUBAL LIGATION     UPPER GI ENDOSCOPY N/A 09/30/2022   Procedure: UPPER GI ENDOSCOPY;  Surgeon: Quentin Ore, MD;  Location: [facility];  Service: General;  Laterality: N/A;   UPPER GI ENDOSCOPY N/A 12/02/2022   Procedure: UPPER ENDOSCOPY;  Surgeon: Quentin Ore, MD;  Location:  [facility];  Service: General;  Laterality: N/A;    Family History  Problem Relation Age of Onset   Diabetes Mother    Hypertension Mother    Hyperlipidemia Mother    Obesity Mother    Peripheral Artery Disease Mother    HIV Father        died from complications (IVDU)   Alcohol abuse Father    Drug abuse Father    Lupus Maternal Aunt    Ovarian cancer Maternal Grandmother    Colon cancer Neg Hx    Esophageal cancer Neg Hx    Liver disease Neg Hx    Pancreatic cancer Neg Hx    Stomach cancer Neg Hx    Sleep apnea Neg Hx    Rectal cancer Neg Hx     Social History   Socioeconomic History   Marital status: Widowed    Spouse name: Mellody Dance   Number of children: 2   Years of education: Not on file   Highest education level: Associate degree: occupational, Scientist, product/process development, or vocational program  Occupational History   Occupation: CMA - Geophysicist/field seismologist: [employer]  Tobacco Use   Smoking status: Former    Packs/day: 0.50    Years: 3.00    Additional pack years: 0.00    Total pack years: 1.50    Types: Cigarettes    Quit date: 08/21/2021    Years since quitting: 1.6    Passive exposure: Never   Smokeless tobacco: Never   Tobacco comments:    4-5/day  Vaping Use   Vaping Use: Never used  Substance and Sexual Activity   Alcohol use: No   Drug use: No   Sexual activity: Yes    Partners: Male    Birth control/protection: Surgical, Implant  Other Topics Concern   Not on file  Social History Narrative    2 children   1997- son Collier Bullock   2000- son Designer, jewellery in [place] [facility]- CMA in [place]   Enjoys reading   Widowed 07-25-23, husband died from COVID-19.   Social Determinants of Health   Financial Resource Strain: Low Risk  (02/19/2023)   Overall Financial Resource Strain (CARDIA)    Difficulty of Paying Living Expenses: Not hard at all  Food Insecurity: No Food Insecurity (02/19/2023)   Hunger Vital Sign    Worried About Running Out of Food in the  Last Year: Never true    Ran Out of Food in the Last Year: Never true  Transportation Needs: No Transportation Needs (02/19/2023)   PRAPARE - Administrator, Civil Service (Medical): No    Lack of Transportation (Non-Medical): No  Physical Activity: Insufficiently Active (02/19/2023)   Exercise Vital Sign    Days of Exercise per Week: 4 days    Minutes of Exercise per Session: 10 min  Stress: No Stress  Concern Present (02/19/2023)   [employer] of Occupational Health - Occupational Stress Questionnaire    Feeling of Stress : Only a little  Social Connections: Moderately Integrated (02/19/2023)   Social Connection and Isolation Panel [NHANES]    Frequency of Communication with Friends and Family: Three times a week    Frequency of Social Gatherings with Friends and Family: Once a week    Attends Religious Services: More than 4 times per year    Active Member of [employer] or Organizations: Yes    Attends Banker Meetings: More than 4 times per year    Marital Status: Widowed  Intimate Partner Violence: Not At Risk (12/02/2022)   Humiliation, Afraid, Rape, and Kick questionnaire    Fear of Current or Ex-Partner: No    Emotionally Abused: No    Physically Abused: No    Sexually Abused: No    Outpatient Medications Prior to Visit  Medication Sig Dispense Refill   Blood Glucose Monitoring Suppl (FREESTYLE LITE) w/Device KIT Use to check blood sugar 4 times a day as directed 1 kit 0   CALCIUM-VITAMIN D PO Take 2 tablets by mouth every evening.     escitalopram (LEXAPRO) 20 MG tablet Take 1 tablet (20 mg total) by mouth daily. 90 tablet 0   famotidine (PEPCID) 20 MG tablet Take 1 tablet (20 mg total) by mouth 2 (two) times daily. 60 tablet 2   glucose blood (FREESTYLE LITE) test strip Use to check blood sugar 4 times a day as directed 100 each 5   Lancets (FREESTYLE) lancets Use to check blood sugar 4 times a day as directed 100 each 0   lisinopril (ZESTRIL) 5 MG tablet  Take 1 tablet (5 mg total) by mouth daily. 90 tablet 1   lovastatin (MEVACOR) 40 MG tablet TAKE 1 TABLET BY MOUTH AT BEDTIME. 30 tablet 11   methocarbamol (ROBAXIN) 500 MG tablet Take 1 tablet (500 mg total) by mouth 4 (four) times daily for 10 days. 40 tablet 0   Multiple Vitamin (MULTIVITAMIN WITH MINERALS) TABS tablet Take 2 tablets by mouth every evening.     ondansetron (ZOFRAN-ODT) 4 MG disintegrating tablet DISSOLVE 1 TABLET(4 MG) ON THE TONGUE EVERY 6 HOURS AS NEEDED 20 tablet 2   RABEprazole (ACIPHEX) 20 MG tablet Take 1 tablet (20 mg total) by mouth daily. 90 tablet 3   sitaGLIPtin (JANUVIA) 100 MG tablet Take 1 tablet (100 mg total) by mouth daily. 30 tablet 5   traMADol (ULTRAM) 50 MG tablet Take 1 tablet (50 mg total) by mouth every 6 (six) hours as needed. 30 tablet 0   Facility-Administered Medications Prior to Visit  Medication Dose Route Frequency Provider Last Rate Last Admin   sodium chloride 0.9 % 1,000 mL with thiamine 100 mg, folic acid 1 mg, M.V.I. Adult 10 mL infusion   Intravenous Once Stechschulte, Hyman Hopes, MD       sodium chloride 0.9 % 1,000 mL with thiamine 100 mg, folic acid 1 mg, M.V.I. Adult 10 mL infusion   Intravenous Once Stechschulte, Hyman Hopes, MD       sodium chloride 0.9 % bolus 1,000 mL  1,000 mL Intravenous Once Stechschulte, Hyman Hopes, MD       sodium chloride 0.9 % bolus 1,000 mL  1,000 mL Intravenous Once Stechschulte, Hyman Hopes, MD        Allergies  Allergen Reactions   Tomato Hives, Itching and Rash    Review of Systems  Gastrointestinal:  Positive  for abdominal pain.       Objective:    Physical Exam Constitutional:      General: She is not in acute distress.    Appearance: Normal appearance. She is well-developed.  HENT:     Head: Normocephalic and atraumatic.     Right Ear: External ear normal.     Left Ear: External ear normal.  Eyes:     General: No scleral icterus. Neck:     Thyroid: No thyromegaly.  Cardiovascular:     Rate and  Rhythm: Normal rate and regular rhythm.     Heart sounds: Normal heart sounds. No murmur heard. Pulmonary:     Effort: Pulmonary effort is normal. No respiratory distress.     Breath sounds: Normal breath sounds. No wheezing.  Abdominal:     General: Bowel sounds are normal.     Palpations: Abdomen is soft.     Tenderness: There is abdominal tenderness in the epigastric area. There is no guarding.  Musculoskeletal:     Cervical back: Neck supple.  Skin:    General: Skin is warm and dry.  Neurological:     Mental Status: She is alert and oriented to person, place, and time.  Psychiatric:        Mood and Affect: Mood normal.        Behavior: Behavior normal.        Thought Content: Thought content normal.        Judgment: Judgment normal.      BP 121/86 (BP Location: Left Arm, Patient Position: Sitting, Cuff Size: Large)   Pulse 95   Ht 5\' 3"  (1.6 m)   Wt 271 lb 12.8 oz (123.3 kg)   LMP 03/29/2023   SpO2 100%   BMI 48.15 kg/m  Wt Readings from Last 3 Encounters:  04/21/23 271 lb 12.8 oz (123.3 kg)  04/11/23 267 lb (121.1 kg)  03/03/23 272 lb 9.6 oz (123.7 kg)       Assessment & Plan:   Problem List Items Addressed This Visit       Unprioritized   Nausea and vomiting    Severe, intermittent, postprandial pain in the setting of recent gastric bypass and hiatal hernia repair. Pain is impacting quality of life. Unremarkable EGD, CT abdomen, and gastric emptying study. Patient is scheduled to see pain management. -Refill Tramadol, up to 3 times a day as needed for pain. Total of 30 tablets to last until pain management appointment. -Signed pain contract with patient.       Chronic abdominal pain - Primary    Severe, intermittent, postprandial pain in the setting of recent gastric bypass and hiatal hernia repair. Pain is impacting quality of life. Unremarkable EGD, CT abdomen, and gastric emptying study. Patient is scheduled to see pain management. -Refill Tramadol, up  to 3 times a day as needed for pain. Total of 30 tablets to last until pain management appointment. -Signed pain contract with patient.       Relevant Medications   traMADol (ULTRAM) 50 MG tablet   Other Relevant Orders   DRUG MONITORING, PANEL 8 WITH CONFIRMATION, URINE    I have changed Roxana Vivanco's traMADol. I am also having her maintain her FreeStyle Lite, freestyle, FREESTYLE LITE, lovastatin, lisinopril, multivitamin with minerals, CALCIUM-VITAMIN D PO, methocarbamol, escitalopram, sitaGLIPtin, RABEprazole, ondansetron, and famotidine.  Meds ordered this encounter  Medications   traMADol (ULTRAM) 50 MG tablet    Sig: Take 1 tablet (50 mg total) by mouth every 8 (  eight) hours as needed.    Dispense:  30 tablet    Refill:  0    Order Specific Question:   Supervising Provider    Answer:   Danise Edge A [[id number]]

## 2023-04-22 ENCOUNTER — Ambulatory Visit: Payer: Medicaid Other | Admitting: Family

## 2023-04-22 ENCOUNTER — Ambulatory Visit: Payer: Commercial Managed Care - PPO | Admitting: Skilled Nursing Facility1

## 2023-04-22 LAB — DRUG MONITORING, PANEL 8 WITH CONFIRMATION, URINE
6 Acetylmorphine: NEGATIVE ng/mL (ref <10)
Alcohol Metabolites: NEGATIVE ng/mL (ref <500)
Amphetamines: NEGATIVE ng/mL (ref <500)
Benzodiazepines: NEGATIVE ng/mL (ref <100)
Buprenorphine, Urine: NEGATIVE ng/mL (ref <5)
Cocaine Metabolite: NEGATIVE ng/mL (ref <150)
Creatinine: >300 mg/dL (ref >=20.0)
MDMA: NEGATIVE ng/mL (ref <500)
Marijuana Metabolite: NEGATIVE ng/mL (ref <20)
Opiates: NEGATIVE ng/mL (ref <100)
Oxidant: NEGATIVE ug/mL (ref <200)
Oxycodone: NEGATIVE ng/mL (ref <100)
pH: 5.3 (ref 4.5–9.0)

## 2023-04-22 LAB — DM TEMPLATE

## 2023-04-22 NOTE — Telephone Encounter (Signed)
Received fax from [facility]- they require written consent from the Pt for the appeal, mychart message sent to Pt. Form placed at front desk.

## 2023-04-25 ENCOUNTER — Other Ambulatory Visit: Payer: Self-pay

## 2023-04-25 ENCOUNTER — Encounter: Payer: Self-pay | Admitting: Family

## 2023-04-25 ENCOUNTER — Other Ambulatory Visit: Payer: Self-pay | Admitting: Physician Assistant

## 2023-04-25 DIAGNOSIS — E1165 Type 2 diabetes mellitus with hyperglycemia: Secondary | ICD-10-CM

## 2023-04-28 ENCOUNTER — Encounter: Payer: Self-pay | Admitting: Family

## 2023-04-30 ENCOUNTER — Ambulatory Visit: Payer: Self-pay | Admitting: Skilled Nursing Facility1

## 2023-05-01 ENCOUNTER — Encounter: Payer: Self-pay | Admitting: Family

## 2023-05-01 ENCOUNTER — Other Ambulatory Visit: Payer: Self-pay | Admitting: Family

## 2023-05-01 DIAGNOSIS — G8929 Other chronic pain: Secondary | ICD-10-CM

## 2023-05-01 MED ORDER — TRAMADOL HCL 50 MG PO TABS: 50.0000 mg | ORAL_TABLET | Freq: Two times a day (BID) | ORAL | 0 refills | Status: DC | PRN

## 2023-05-02 ENCOUNTER — Telehealth: Payer: Self-pay | Admitting: Family

## 2023-05-02 ENCOUNTER — Other Ambulatory Visit (HOSPITAL_COMMUNITY): Payer: Self-pay

## 2023-05-02 DIAGNOSIS — R109 Unspecified abdominal pain: Secondary | ICD-10-CM

## 2023-05-02 NOTE — Telephone Encounter (Signed)
[facility] called to advise Dr. Pecola Leisure is unable to see the pt. No reason given. Pt will need to be referred somewhere else.

## 2023-05-02 NOTE — Telephone Encounter (Signed)
Per Dr. Kenna Gilbert office she is not accepting medicaid patients Please refer to different GI

## 2023-05-03 NOTE — Addendum Note (Signed)
Addended by: Sandford Craze on: 05/03/2023 01:37 PM   Modules accepted: Orders

## 2023-05-07 ENCOUNTER — Other Ambulatory Visit (HOSPITAL_COMMUNITY): Payer: Self-pay

## 2023-05-07 ENCOUNTER — Telehealth: Payer: Self-pay

## 2023-05-07 NOTE — Telephone Encounter (Signed)
Pharmacy Patient Advocate Encounter   Received notification from Patient Advice Request messages that prior authorization for traMADol HCl 50MG  tablets is required/requested.   Insurance verification completed.   The patient is insured through [employer] [place] .   Per test claim: PA submitted to [facility] Medicaid via CoverMyMeds Key/confirmation #/[id number]  Status is pending

## 2023-05-08 IMAGING — DX DG CHEST 2V
2 series · 2 of 2 positions shown · non-contrast
Comparison: 03/04/2016

CLINICAL DATA: RIGHT upper quadrant pain since [REDACTED], nausea

EXAM:
CHEST - 2 VIEW

[chest pa]
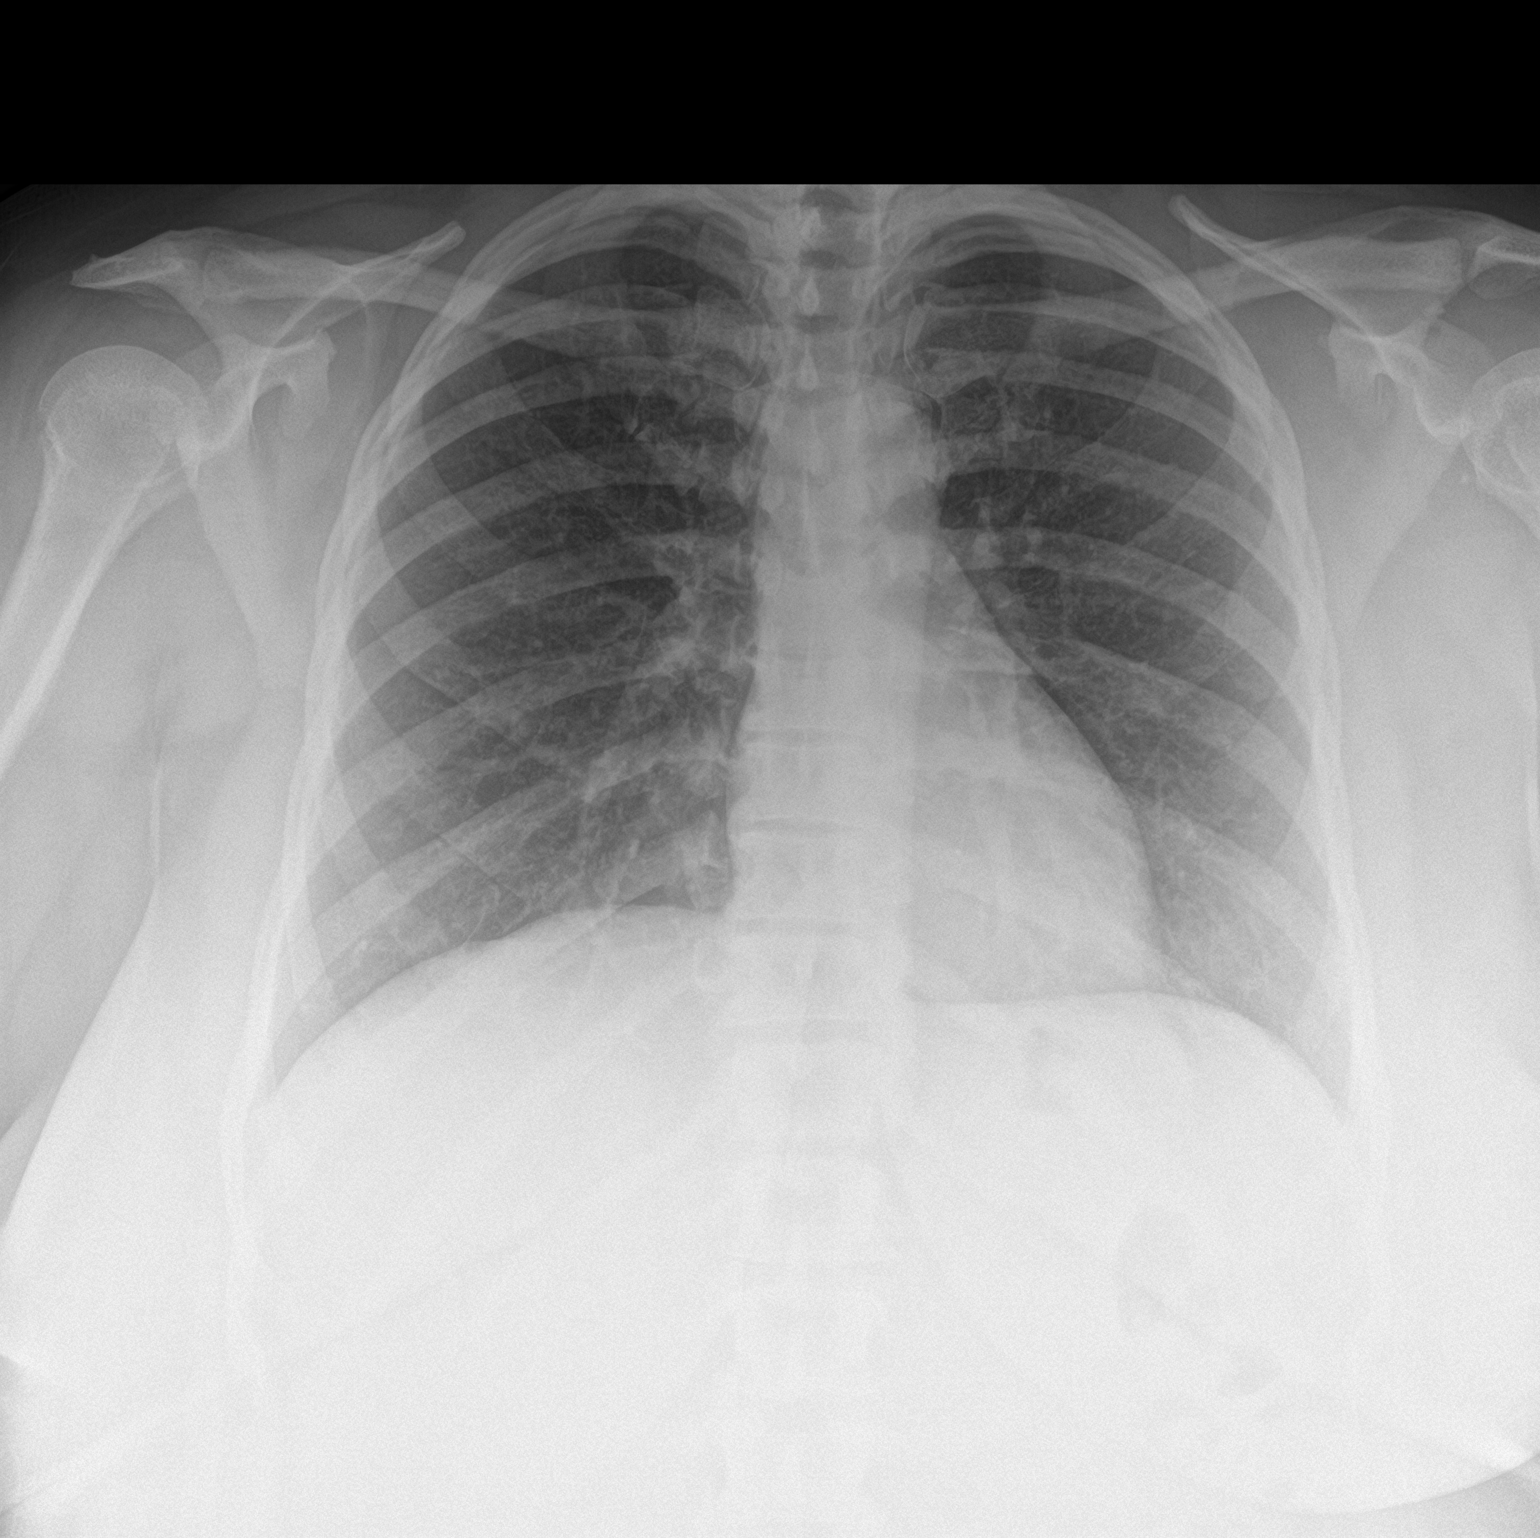

[chest lat]
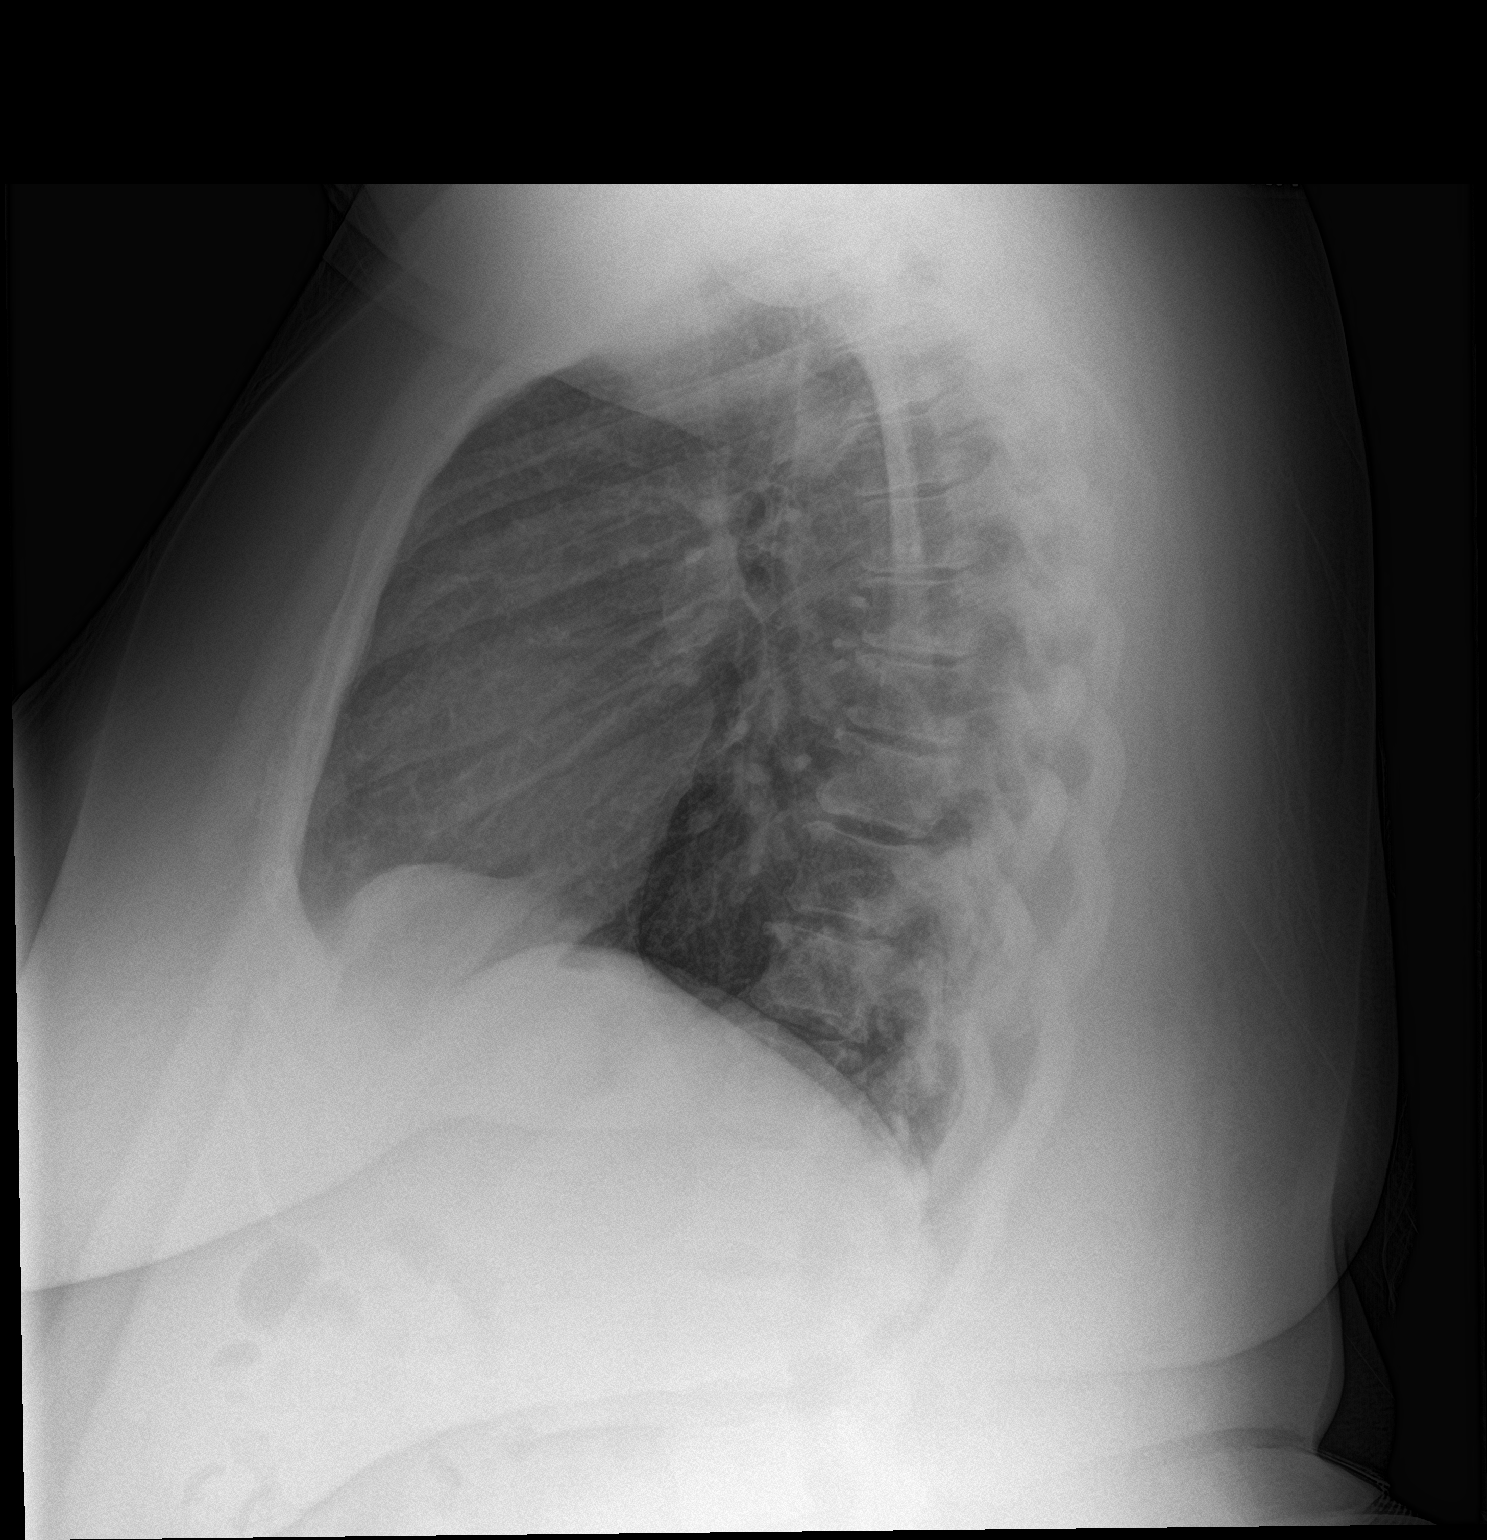

[2 of 2 positions shown; findings below may reference images not displayed]

FINDINGS: Normal heart size, mediastinal contours, and pulmonary vascularity.

Lungs clear.

No pleural effusion or pneumothorax.

Bones unremarkable.
IMPRESSION: Normal exam.

## 2023-05-08 NOTE — Telephone Encounter (Signed)
Pharmacy Patient Advocate Encounter  Received notification from [facility] that Prior Authorization for  traMADol HCl 50MG  tablets  has been APPROVED from 05/07/23 to 11/03/23.[name]  PA #/Case ID/Reference #: [id number]   Approval letter attached to charts

## 2023-05-13 ENCOUNTER — Other Ambulatory Visit: Payer: Self-pay | Admitting: Family

## 2023-05-13 ENCOUNTER — Other Ambulatory Visit: Payer: Self-pay | Admitting: Physician Assistant

## 2023-05-14 ENCOUNTER — Encounter: Payer: Self-pay | Admitting: Family

## 2023-05-15 MED ORDER — ONDANSETRON 4 MG PO TBDP: 4.0000 mg | ORAL_TABLET | Freq: Three times a day (TID) | ORAL | 1 refills | Status: DC | PRN

## 2023-05-18 ENCOUNTER — Encounter: Payer: Self-pay | Admitting: Family

## 2023-05-18 ENCOUNTER — Other Ambulatory Visit: Payer: Self-pay | Admitting: Family

## 2023-05-27 ENCOUNTER — Ambulatory Visit: Payer: Medicaid Other | Admitting: Gastroenterology

## 2023-05-28 ENCOUNTER — Other Ambulatory Visit (HOSPITAL_COMMUNITY): Payer: Self-pay

## 2023-05-28 ENCOUNTER — Other Ambulatory Visit: Payer: Self-pay

## 2023-05-28 ENCOUNTER — Encounter: Payer: Self-pay | Admitting: Family

## 2023-05-28 ENCOUNTER — Other Ambulatory Visit: Payer: Self-pay | Admitting: Family

## 2023-05-28 ENCOUNTER — Other Ambulatory Visit (INDEPENDENT_AMBULATORY_CARE_PROVIDER_SITE_OTHER): Payer: Medicaid Other

## 2023-05-28 DIAGNOSIS — E1165 Type 2 diabetes mellitus with hyperglycemia: Secondary | ICD-10-CM

## 2023-05-28 DIAGNOSIS — G894 Chronic pain syndrome: Secondary | ICD-10-CM

## 2023-05-28 LAB — COMPREHENSIVE METABOLIC PANEL
ALT: 32 U/L (ref 0–35)
AST: 25 U/L (ref 0–37)
Albumin: 4.1 g/dL (ref 3.5–5.2)
Alkaline Phosphatase: 133 U/L — ABNORMAL HIGH (ref 39–117)
BUN: 13 mg/dL (ref 6–23)
CO2: 26 mEq/L (ref 19–32)
Calcium: 9.4 mg/dL (ref 8.4–10.5)
Chloride: 99 mEq/L (ref 96–112)
Creatinine, Ser: 0.86 mg/dL (ref 0.40–1.20)
GFR: 81.85 mL/min (ref >60.00)
Glucose, Bld: 195 mg/dL — ABNORMAL HIGH (ref 70–99)
Potassium: 4.2 mEq/L (ref 3.5–5.1)
Sodium: 137 mEq/L (ref 135–145)
Total Bilirubin: 0.4 mg/dL (ref 0.2–1.2)
Total Protein: 7.3 g/dL (ref 6.0–8.3)

## 2023-05-28 LAB — LIPID PANEL
Cholesterol: 220 mg/dL — ABNORMAL HIGH (ref 0–200)
HDL: 55.7 mg/dL (ref >39.00)
LDL Cholesterol: 142 mg/dL — ABNORMAL HIGH (ref 0–99)
NonHDL: 164.5
Total CHOL/HDL Ratio: 4
Triglycerides: 114 mg/dL (ref 0.0–149.0)
VLDL: 22.8 mg/dL (ref 0.0–40.0)

## 2023-05-28 LAB — MICROALBUMIN / CREATININE URINE RATIO
Creatinine,U: 240.4 mg/dL
Microalb Creat Ratio: 2.4 mg/g (ref 0.0–30.0)
Microalb, Ur: 5.7 mg/dL — ABNORMAL HIGH (ref 0.0–1.9)

## 2023-05-28 LAB — HEMOGLOBIN A1C: Hgb A1c MFr Bld: 8.7 % — ABNORMAL HIGH (ref 4.6–6.5)

## 2023-05-28 MED ORDER — LISINOPRIL 5 MG PO TABS
5.0000 mg | ORAL_TABLET | Freq: Every day | ORAL | 1 refills | Status: DC
  Filled 2023-05-28: qty 90, 90d supply, fill #0

## 2023-05-29 ENCOUNTER — Other Ambulatory Visit: Payer: Self-pay | Admitting: Family

## 2023-05-29 MED ORDER — GABAPENTIN 300 MG PO CAPS: 300.0000 mg | ORAL_CAPSULE | Freq: Three times a day (TID) | ORAL | 3 refills | Status: DC

## 2023-05-29 MED ORDER — TRAMADOL HCL 50 MG PO TABS: 50.0000 mg | ORAL_TABLET | Freq: Two times a day (BID) | ORAL | 0 refills | Status: DC | PRN

## 2023-05-29 NOTE — Addendum Note (Signed)
Addended by: Sandford Craze on: 05/29/2023 03:37 PM   Modules accepted: Orders

## 2023-06-02 ENCOUNTER — Other Ambulatory Visit: Payer: Self-pay

## 2023-06-03 ENCOUNTER — Encounter: Payer: Self-pay | Admitting: "Endocrinology

## 2023-06-03 ENCOUNTER — Ambulatory Visit (INDEPENDENT_AMBULATORY_CARE_PROVIDER_SITE_OTHER): Payer: Medicaid Other | Admitting: "Endocrinology

## 2023-06-03 VITALS — BP 130/85 | HR 107 | Ht 63.0 in | Wt 272.4 lb

## 2023-06-03 DIAGNOSIS — Z7984 Long term (current) use of oral hypoglycemic drugs: Secondary | ICD-10-CM

## 2023-06-03 DIAGNOSIS — E7800 Pure hypercholesterolemia, unspecified: Secondary | ICD-10-CM

## 2023-06-03 DIAGNOSIS — E1165 Type 2 diabetes mellitus with hyperglycemia: Secondary | ICD-10-CM | POA: Diagnosis not present

## 2023-06-03 MED ORDER — ROSUVASTATIN CALCIUM 5 MG PO TABS
5.0000 mg | ORAL_TABLET | Freq: Every day | ORAL | 1 refills | Status: DC
Start: 2023-06-03 — End: 2023-09-23

## 2023-06-03 MED ORDER — DEXCOM G7 SENSOR MISC: 1.0000 | 0 refills | Status: DC

## 2023-06-03 MED ORDER — XIGDUO XR 2.5-1000 MG PO TB24: 1.0000 | ORAL_TABLET | Freq: Every day | ORAL | 1 refills | Status: DC

## 2023-06-03 NOTE — Patient Instructions (Signed)

## 2023-06-03 NOTE — Progress Notes (Signed)
Outpatient Endocrinology Note [place], MD  06/03/23   Elizabeth [name] [birth year] [id number]  Referring Provider: Sandford Craze, NP Primary Care Provider: Sandford Craze, NP Reason for consultation: Subjective   Assessment & Plan  Diagnoses and all orders for this visit:  Uncontrolled type 2 diabetes mellitus with hyperglycemia (HCC) -     rosuvastatin (CRESTOR) 5 MG tablet; Take 1 tablet (5 mg total) by mouth daily. -     Comprehensive metabolic panel; Future -     Hemoglobin A1c; Future -     Lipid panel; Future  Long term (current) use of oral hypoglycemic drugs  Pure hypercholesterolemia  Other orders -     Dapagliflozin Pro-metFORMIN ER (XIGDUO XR) 2.02-999 MG TB24; Take 1 tablet by mouth daily. -     Continuous Glucose Sensor (DEXCOM G7 SENSOR) MISC; 1 Device by Does not apply route continuous.   Diabetes Type II complicated by hyperglycemia  Lab Results  Component Value Date   GFR 81.85 05/28/2023   Hba1c goal less than 7, current Hba1c is  Lab Results  Component Value Date   HGBA1C 8.7 (H) 05/28/2023   Will recommend the following: Xigduo 2.02/999 mg every day, pt wants tor resume again, was discontinued after RYGB Januvia 100 mg every day Ordered DexCom  Trulicity was stopped due to pt needing feeding tube to nausea/vomiting after RYGB in 09/2022  No known contraindications/side effects to any of above medications  -Last LD and Tg are as follows: Lab Results  Component Value Date   LDLCALC 142 (H) 05/28/2023    Lab Results  Component Value Date   TRIG 114.0 05/28/2023   -On lovastatin 40 mg every day -Switch to crestor 5 mg every day -Repeat labs and CMP to watch for AST/ALT -Follow low fat diet and exercise   -Blood pressure goal <140/90 - Microalbumin/creatinine goal is < 30 -Last MA/Cr is as follows: Lab Results  Component Value Date   MICROALBUR 5.7 (H) 05/28/2023   -on ACE/ARB lisinopril 5 mg every day  -diet  changes including salt restriction -limit eating outside -counseled BP targets per standards of diabetes care -uncontrolled blood pressure can lead to retinopathy, nephropathy and cardiovascular and atherosclerotic heart disease  Reviewed and counseled on: -A1C target -Blood sugar targets -Complications of uncontrolled diabetes  -Checking blood sugar before meals and bedtime and bring log next visit -All medications with mechanism of action and side effects -Hypoglycemia management: rule of 15's, Glucagon Emergency Kit and medical alert ID -low-carb low-fat plate-method diet -At least 20 minutes of physical activity per day -Annual dilated retinal eye exam and foot exam -compliance and follow up needs -follow up as scheduled or earlier if problem gets worse  Call if blood sugar is less than 70 or consistently above 250    Take a 15 gm snack of carbohydrate at bedtime before you go to sleep if your blood sugar is less than 100.    If you are going to fast after midnight for a test or procedure, ask your physician for instructions on how to reduce/decrease your insulin dose.    Call if blood sugar is less than 70 or consistently above 250  -Treating a low sugar by rule of 15  (15 gms of sugar every 15 min until sugar is more than 70) If you feel your sugar is low, test your sugar to be sure If your sugar is low (less than 70), then take 15 grams of a fast acting Carbohydrate (3-4  glucose tablets or glucose gel or 4 ounces of juice or regular soda) Recheck your sugar 15 min after treating low to make sure it is more than 70 If sugar is still less than 70, treat again with 15 grams of carbohydrate          Don't drive the hour of hypoglycemia  If unconscious/unable to eat or drink by mouth, use glucagon injection or nasal spray baqsimi and call 911. Can repeat again in 15 min if still unconscious.  Return in about 4 months (around 10/03/2023) for visit and 8 am labs before next  visit.   I have reviewed current medications, nurse's notes, allergies, vital signs, past medical and surgical history, family medical history, and social history for this encounter. Counseled patient on symptoms, examination findings, lab findings, imaging results, treatment decisions and monitoring and prognosis. The patient understood the recommendations and agrees with the treatment plan. All questions regarding treatment plan were fully answered.  Elizabeth [place], MD  06/03/23    History of Present Illness Elizabeth [name] is a 47 y.o. year old female who presents for evaluation of Type II diabetes mellitus.  Elizabeth [name] was first diagnosed in 2009.   Diabetes education +  Home diabetes regimen: Januvia 100 mg every day  Trulicity was stopped due to pt needing feeding tube to nausea/vomiting after RYGB in 09/2022 Did well with xigduo bid in past  COMPLICATIONS -  MI/Stroke -  retinopathy -  neuropathy -  nephropathy  SYMPTOMS REVIEWED - Polyuria + Weight loss, has RYGB 09/30/22; pre-surgery 316 lb - Blurred vision  BLOOD SUGAR DATA Did not bring meter, checks qam  Per recall: fasting 147-212   Physical Exam  BP 130/85   Pulse (!) 107   Ht 5\' 3"  (1.6 m)   Wt 272 lb 6.4 oz (123.6 kg)   SpO2 99%   BMI 48.25 kg/m    Constitutional: well developed, well nourished Head: normocephalic, atraumatic Eyes: sclera anicteric, no redness Neck: supple Lungs: normal respiratory effort Neurology: alert and oriented Skin: dry, no appreciable rashes Musculoskeletal: no appreciable defects Psychiatric: normal mood and affect Diabetic Foot Exam - Simple   No data filed      Current Medications Patient's Medications  New Prescriptions   CONTINUOUS GLUCOSE SENSOR (DEXCOM G7 SENSOR) MISC    1 Device by Does not apply route continuous.   DAPAGLIFLOZIN PRO-METFORMIN ER (XIGDUO XR) 2.02-999 MG TB24    Take 1 tablet by mouth daily.   ROSUVASTATIN (CRESTOR) 5 MG TABLET     Take 1 tablet (5 mg total) by mouth daily.  Previous Medications   BLOOD GLUCOSE MONITORING SUPPL (FREESTYLE LITE) W/DEVICE KIT    Use to check blood sugar 4 times a day as directed   CALCIUM-VITAMIN D PO    Take 2 tablets by mouth every evening.   ESCITALOPRAM (LEXAPRO) 20 MG TABLET    TAKE 1 TABLET(20 MG) BY MOUTH DAILY   FAMOTIDINE (PEPCID) 20 MG TABLET    Take 1 tablet (20 mg total) by mouth 2 (two) times daily.   GABAPENTIN (NEURONTIN) 300 MG CAPSULE    Take 1 capsule (300 mg total) by mouth 3 (three) times daily.   GLUCOSE BLOOD (FREESTYLE LITE) TEST STRIP    Use to check blood sugar 4 times a day as directed   LANCETS (FREESTYLE) LANCETS    Use to check blood sugar 4 times a day as directed   LISINOPRIL (ZESTRIL) 5 MG TABLET    Take  1 tablet (5 mg total) by mouth daily.   MULTIPLE VITAMIN (MULTIVITAMIN WITH MINERALS) TABS TABLET    Take 2 tablets by mouth every evening.   ONDANSETRON (ZOFRAN-ODT) 4 MG DISINTEGRATING TABLET    Take 1 tablet (4 mg total) by mouth every 8 (eight) hours as needed for nausea or vomiting.   RABEPRAZOLE (ACIPHEX) 20 MG TABLET    Take 1 tablet (20 mg total) by mouth daily.   SITAGLIPTIN (JANUVIA) 100 MG TABLET    Take 1 tablet (100 mg total) by mouth daily.   TRAMADOL (ULTRAM) 50 MG TABLET    Take 1 tablet (50 mg total) by mouth every 12 (twelve) hours as needed.  Modified Medications   No medications on file  Discontinued Medications   LOVASTATIN (MEVACOR) 40 MG TABLET    TAKE 1 TABLET BY MOUTH AT BEDTIME.    Allergies Allergies  Allergen Reactions   Tomato Hives, Itching and Rash    Past Medical History Past Medical History:  Diagnosis Date   Anemia    Anxiety    Astigmatism    Depression    Diabetes mellitus without complication (HCC)    Dry mouth    Fatigue    GERD (gastroesophageal reflux disease)    Heartburn    Hepatic steatosis    Hepatomegaly    High cholesterol    Hypertension    Keratoconjunctivitis    Liver disease    Muscle  pain    Nausea and vomiting 04/21/2023   Ovarian cyst    Positive ANA (antinuclear antibody)    Pulmonary nodules    Vitamin D deficiency    Weakness     Past Surgical History Past Surgical History:  Procedure Laterality Date   BIOPSY  01/18/2021   Procedure: BIOPSY;  Surgeon: Shellia Cleverly, DO;  Location: [facility];  Service: Gastroenterology;;   CHOLECYSTECTOMY N/A 05/11/2021   Procedure: LAPAROSCOPIC CHOLECYSTECTOMY WITH INTRAOPERATIVE CHOLANGIOGRAM;  Surgeon: Quentin Ore, MD;  Location: [facility];  Service: General;  Laterality: N/A;   DILATION AND CURETTAGE OF UTERUS     ENDOMETRIAL BIOPSY  12/19/2017   normal per pt   ESOPHAGOGASTRODUODENOSCOPY (EGD) WITH PROPOFOL N/A 01/18/2021   Procedure: ESOPHAGOGASTRODUODENOSCOPY (EGD) WITH PROPOFOL;  Surgeon: Shellia Cleverly, DO;  Location: [facility];  Service: Gastroenterology;  Laterality: N/A;   HIATAL HERNIA REPAIR N/A 09/30/2022   Procedure: HERNIA REPAIR HIATAL;  Surgeon: Quentin Ore, MD;  Location: [facility];  Service: General;  Laterality: N/A;   LAPAROSCOPIC INSERTION GASTROSTOMY TUBE N/A 12/02/2022   Procedure: LAPAROSCOPIC REMNANT GASTROSTOMY G TUBE;  Surgeon: Quentin Ore, MD;  Location: [facility];  Service: General;  Laterality: N/A;   LAPAROSCOPIC ROUX-EN-Y GASTRIC BYPASS WITH HIATAL HERNIA REPAIR  09/30/2022   LAPAROSCOPY N/A 12/02/2022   Procedure: LAPAROSCOPY DIAGNOSTIC;  Surgeon: Quentin Ore, MD;  Location: [facility];  Service: General;  Laterality: N/A;   TONSILLECTOMY     TUBAL LIGATION     UPPER GI ENDOSCOPY N/A 09/30/2022   Procedure: UPPER GI ENDOSCOPY;  Surgeon: Quentin Ore, MD;  Location: [facility];  Service: General;  Laterality: N/A;   UPPER GI ENDOSCOPY N/A 12/02/2022   Procedure: UPPER ENDOSCOPY;  Surgeon: Quentin Ore, MD;  Location: [facility];  Service: General;  Laterality: N/A;    Family History family history includes Alcohol abuse in her father; Diabetes in  her mother; Drug abuse in her father; HIV in her father; Hyperlipidemia in her mother; Hypertension in her  mother; Lupus in her maternal aunt; Obesity in her mother; Ovarian cancer in her maternal grandmother; Peripheral Artery Disease in her mother.  Social History Social History   Socioeconomic History   Marital status: Widowed    Spouse name: Mellody Dance   Number of children: 2   Years of education: Not on file   Highest education level: Associate degree: occupational, Scientist, product/process development, or vocational program  Occupational History   Occupation: CMA - Geophysicist/field seismologist: [employer]  Tobacco Use   Smoking status: Former    Current packs/day: 0.00    Average packs/day: 0.5 packs/day for 3.0 years (1.5 ttl pk-yrs)    Types: Cigarettes    Start date: 08/21/2018    Quit date: 08/21/2021    Years since quitting: 1.7    Passive exposure: Never   Smokeless tobacco: Never   Tobacco comments:    4-5/day  Vaping Use   Vaping status: Never Used  Substance and Sexual Activity   Alcohol use: No   Drug use: No   Sexual activity: Yes    Partners: Male    Birth control/protection: Surgical, Implant  Other Topics Concern   Not on file  Social History Narrative    2 children   1997- son Collier Bullock   2000- son Designer, jewellery in [place] [facility]- CMA in [place]   Enjoys reading   Widowed 2023/08/02, husband died from COVID-19.   Social Determinants of Health   Financial Resource Strain: Low Risk  (02/19/2023)   Overall Financial Resource Strain (CARDIA)    Difficulty of Paying Living Expenses: Not hard at all  Food Insecurity: No Food Insecurity (02/19/2023)   Hunger Vital Sign    Worried About Running Out of Food in the Last Year: Never true    Ran Out of Food in the Last Year: Never true  Transportation Needs: No Transportation Needs (02/19/2023)   PRAPARE - Administrator, Civil Service (Medical): No    Lack of Transportation (Non-Medical): No  Physical Activity:  Insufficiently Active (02/19/2023)   Exercise Vital Sign    Days of Exercise per Week: 4 days    Minutes of Exercise per Session: 10 min  Stress: No Stress Concern Present (02/19/2023)   [employer] of Occupational Health - Occupational Stress Questionnaire    Feeling of Stress : Only a little  Social Connections: Moderately Integrated (02/19/2023)   Social Connection and Isolation Panel [NHANES]    Frequency of Communication with Friends and Family: Three times a week    Frequency of Social Gatherings with Friends and Family: Once a week    Attends Religious Services: More than 4 times per year    Active Member of [employer] or Organizations: Yes    Attends Banker Meetings: More than 4 times per year    Marital Status: Widowed  Intimate Partner Violence: Not At Risk (12/02/2022)   Humiliation, Afraid, Rape, and Kick questionnaire    Fear of Current or Ex-Partner: No    Emotionally Abused: No    Physically Abused: No    Sexually Abused: No    Lab Results  Component Value Date   HGBA1C 8.7 (H) 05/28/2023   HGBA1C 9.1 (H) 03/07/2023   HGBA1C 7.4 (H) 12/02/2022   Lab Results  Component Value Date   CHOL 220 (H) 05/28/2023   Lab Results  Component Value Date   HDL 55.70 05/28/2023   Lab Results  Component Value Date   LDLCALC 142 (H)  05/28/2023   Lab Results  Component Value Date   TRIG 114.0 05/28/2023   Lab Results  Component Value Date   CHOLHDL 4 05/28/2023   Lab Results  Component Value Date   CREATININE 0.86 05/28/2023   Lab Results  Component Value Date   GFR 81.85 05/28/2023   Lab Results  Component Value Date   MICROALBUR 5.7 (H) 05/28/2023      Component Value Date/Time   NA 137 05/28/2023 0839   NA 137 02/27/2022 0000   K 4.2 05/28/2023 0839   CL 99 05/28/2023 0839   CO2 26 05/28/2023 0839   GLUCOSE 195 (H) 05/28/2023 0839   BUN 13 05/28/2023 0839   BUN 7 12/22/2018 1430   CREATININE 0.86 05/28/2023 0839   CREATININE 0.59  07/14/2020 1217   CALCIUM 9.4 05/28/2023 0839   PROT 7.3 05/28/2023 0839   PROT 6.8 12/22/2018 1430   ALBUMIN 4.1 05/28/2023 0839   ALBUMIN 4.3 12/22/2018 1430   AST 25 05/28/2023 0839   ALT 32 05/28/2023 0839   ALKPHOS 133 (H) 05/28/2023 0839   BILITOT 0.4 05/28/2023 0839   BILITOT 0.3 12/22/2018 1430   GFRNONAA >60 12/06/2022 0501   GFRAA 133 12/22/2018 1430      Latest Ref Rng & Units 05/28/2023    8:39 AM 03/07/2023   11:06 AM 12/06/2022    5:01 AM  BMP  Glucose 70 - 99 mg/dL 161  096  045   BUN 6 - 23 mg/dL 13  10  11    Creatinine 0.40 - 1.20 mg/dL 4.09  8.11  9.14   Sodium 135 - 145 mEq/L 137  138  139   Potassium 3.5 - 5.1 mEq/L 4.2  4.2  4.0   Chloride 96 - 112 mEq/L 99  101  102   CO2 19 - 32 mEq/L 26  27  23    Calcium 8.4 - 10.5 mg/dL 9.4  9.2  8.9        Component Value Date/Time   WBC 5.5 12/06/2022 0501   RBC 5.25 (H) 12/06/2022 0501   HGB 12.1 12/06/2022 0501   HGB 13.3 12/22/2018 1430   HCT 39.7 12/06/2022 0501   HCT 43.4 12/22/2018 1430   PLT 251 12/06/2022 0501   PLT 301 12/22/2018 1430   MCV 75.6 (L) 12/06/2022 0501   MCV 73 (L) 12/22/2018 1430   MCH 23.0 (L) 12/06/2022 0501   MCHC 30.5 12/06/2022 0501   RDW 16.5 (H) 12/06/2022 0501   RDW 15.9 (H) 12/22/2018 1430   LYMPHSABS 2.1 11/24/2022 0305   LYMPHSABS 2.1 12/22/2018 1430   MONOABS 0.4 11/24/2022 0305   EOSABS 0.1 11/24/2022 0305   EOSABS 0.1 12/22/2018 1430   BASOSABS 0.0 11/24/2022 0305   BASOSABS 0.1 12/22/2018 1430     Parts of this note may have been dictated using voice recognition software. There may be variances in spelling and vocabulary which are unintentional. Not all errors are proofread. Please notify the Thereasa Parkin if any discrepancies are noted or if the meaning of any statement is not clear.

## 2023-06-05 ENCOUNTER — Telehealth: Payer: Self-pay

## 2023-06-05 ENCOUNTER — Encounter (INDEPENDENT_AMBULATORY_CARE_PROVIDER_SITE_OTHER): Payer: Self-pay

## 2023-06-05 ENCOUNTER — Encounter: Payer: Self-pay | Admitting: "Endocrinology

## 2023-06-05 ENCOUNTER — Other Ambulatory Visit (HOSPITAL_COMMUNITY): Payer: Self-pay

## 2023-06-05 NOTE — Telephone Encounter (Signed)
Pharmacy Patient Advocate Encounter   Received notification from Pt Calls Messages that prior authorization for Elizabeth [name] is required/requested.   Insurance verification completed.   The patient is insured through [facility] .   Per test claim: PA required; PA submitted to [facility] via CoverMyMeds Key/confirmation #/EOC [id number] Status is pending

## 2023-06-05 NOTE — Telephone Encounter (Signed)
Xigduo and Freestyle need a PA.

## 2023-06-05 NOTE — Telephone Encounter (Signed)
Pharmacy Patient Advocate Encounter   Received notification from Pt Calls Messages that prior authorization for Dexcom G7 sensor is required/requested.   Insurance verification completed.   The patient is insured through [facility] .   Per test claim: PA required; PA submitted to [facility] via CoverMyMeds Key/confirmation #/EOC [facility] Status is pending

## 2023-06-11 NOTE — Telephone Encounter (Signed)
Pharmacy Patient Advocate Encounter  Received notification from [facility] that Prior Authorization for Davonna Belling has been DENIED. Please advise how you'd like to proceed. Full denial letter will be uploaded to the media tab. See denial reason below.

## 2023-06-11 NOTE — Telephone Encounter (Signed)
Pharmacy Patient Advocate Encounter  Received notification from [facility] that Prior Authorization for Dexcom has been DENIED. Please advise how you'd like to proceed. Full denial letter will be uploaded to the media tab. See denial reason below.

## 2023-06-12 ENCOUNTER — Other Ambulatory Visit: Payer: Self-pay | Admitting: "Endocrinology

## 2023-06-12 MED ORDER — METFORMIN HCL ER 500 MG PO TB24: 500.0000 mg | ORAL_TABLET | Freq: Two times a day (BID) | ORAL | 4 refills | Status: DC

## 2023-06-12 MED ORDER — DAPAGLIFLOZIN PROPANEDIOL 5 MG PO TABS: 5.0000 mg | ORAL_TABLET | Freq: Every day | ORAL | 1 refills | Status: DC

## 2023-06-12 NOTE — Telephone Encounter (Signed)
Elizabeth [name] has been denied, please advise.

## 2023-06-16 ENCOUNTER — Other Ambulatory Visit (HOSPITAL_COMMUNITY): Payer: Self-pay

## 2023-06-16 ENCOUNTER — Telehealth: Payer: Self-pay

## 2023-06-16 NOTE — Telephone Encounter (Signed)
Th Pharmacy is saying [name] brand name needs insurance approval.

## 2023-06-16 NOTE — Telephone Encounter (Signed)
Pharmacy Patient Advocate Encounter  Received notification from  [employer]  that Prior Authorization for [name] has been APPROVED from 06/02/23 to 06/15/24. Ran test claim, Copay is $4. This test claim was processed through [facility]- copay amounts may vary at other pharmacies due to pharmacy/plan contracts, or as the patient moves through the different stages of their insurance plan.   PA #/Case ID/Reference #: [id number]

## 2023-06-16 NOTE — Telephone Encounter (Signed)
Pharmacy Patient Advocate Encounter   Received notification from Pt Calls Messages that prior authorization for [name] is required/requested.   Insurance verification completed.   The patient is insured through [facility] .   Per test claim: PA required; PA submitted to [facility] via CoverMyMeds Key/confirmation #/EOC [id number] Status is pending

## 2023-06-16 NOTE — Telephone Encounter (Signed)
I spoke to [employer] and she is aware

## 2023-06-18 ENCOUNTER — Encounter: Payer: Self-pay | Admitting: Family

## 2023-06-19 ENCOUNTER — Other Ambulatory Visit: Payer: Self-pay

## 2023-06-19 MED ORDER — HYDROXYZINE HCL 25 MG PO TABS: 50.0000 mg | ORAL_TABLET | Freq: Every evening | ORAL | 3 refills | Status: DC | PRN

## 2023-06-23 ENCOUNTER — Encounter: Payer: Self-pay | Admitting: Family

## 2023-06-24 MED ORDER — TRAMADOL HCL 50 MG PO TABS: 50.0000 mg | ORAL_TABLET | Freq: Three times a day (TID) | ORAL | 0 refills | Status: DC | PRN

## 2023-06-26 NOTE — Telephone Encounter (Signed)
Could you please call [facility] to check status of pt referral/appointment?

## 2023-06-30 ENCOUNTER — Encounter: Payer: Self-pay | Admitting: "Endocrinology

## 2023-07-01 ENCOUNTER — Other Ambulatory Visit: Payer: Self-pay

## 2023-07-01 DIAGNOSIS — R109 Unspecified abdominal pain: Secondary | ICD-10-CM

## 2023-07-10 ENCOUNTER — Other Ambulatory Visit: Payer: Self-pay

## 2023-07-10 ENCOUNTER — Emergency Department (HOSPITAL_BASED_OUTPATIENT_CLINIC_OR_DEPARTMENT_OTHER)
Admission: EM | Admit: 2023-07-10 | Discharge: 2023-07-10 | Disposition: A | Discharge status: Home / Self Care | Payer: Medicaid Other | Attending: Emergency Medicine | Admitting: Emergency Medicine

## 2023-07-10 ENCOUNTER — Emergency Department (HOSPITAL_BASED_OUTPATIENT_CLINIC_OR_DEPARTMENT_OTHER): Payer: Medicaid Other

## 2023-07-10 ENCOUNTER — Encounter (HOSPITAL_BASED_OUTPATIENT_CLINIC_OR_DEPARTMENT_OTHER): Payer: Self-pay | Admitting: Emergency Medicine

## 2023-07-10 DIAGNOSIS — R1012 Left upper quadrant pain: Secondary | ICD-10-CM | POA: Insufficient documentation

## 2023-07-10 DIAGNOSIS — R109 Unspecified abdominal pain: Secondary | ICD-10-CM | POA: Diagnosis present

## 2023-07-10 DIAGNOSIS — R1011 Right upper quadrant pain: Secondary | ICD-10-CM | POA: Insufficient documentation

## 2023-07-10 DIAGNOSIS — R1013 Epigastric pain: Secondary | ICD-10-CM | POA: Insufficient documentation

## 2023-07-10 DIAGNOSIS — R1010 Upper abdominal pain, unspecified: Secondary | ICD-10-CM

## 2023-07-10 LAB — COMPREHENSIVE METABOLIC PANEL
ALT: 28 U/L (ref 0–44)
AST: 27 U/L (ref 15–41)
Albumin: 3.8 g/dL (ref 3.5–5.0)
Alkaline Phosphatase: 104 U/L (ref 38–126)
Anion gap: 12 (ref 5–15)
BUN: 13 mg/dL (ref 6–20)
CO2: 24 mmol/L (ref 22–32)
Calcium: 9.5 mg/dL (ref 8.9–10.3)
Chloride: 102 mmol/L (ref 98–111)
Creatinine, Ser: 0.77 mg/dL (ref 0.44–1.00)
GFR, Estimated: >60 mL/min (ref >60)
Glucose, Bld: 172 mg/dL — ABNORMAL HIGH (ref 70–99)
Potassium: 4.5 mmol/L (ref 3.5–5.1)
Sodium: 138 mmol/L (ref 135–145)
Total Bilirubin: 0.4 mg/dL (ref 0.3–1.2)
Total Protein: 7.9 g/dL (ref 6.5–8.1)

## 2023-07-10 LAB — CBC
HCT: 41.8 % (ref 36.0–46.0)
Hemoglobin: 12.8 g/dL (ref 12.0–15.0)
MCH: 23.1 pg — ABNORMAL LOW (ref 26.0–34.0)
MCHC: 30.6 g/dL (ref 30.0–36.0)
MCV: 75.6 fL — ABNORMAL LOW (ref 80.0–100.0)
Platelets: 250 10*3/uL (ref 150–400)
RBC: 5.53 MIL/uL — ABNORMAL HIGH (ref 3.87–5.11)
RDW: 14.6 % (ref 11.5–15.5)
WBC: 6.3 10*3/uL (ref 4.0–10.5)
nRBC: 0 % (ref 0.0–0.2)

## 2023-07-10 LAB — URINALYSIS, ROUTINE W REFLEX MICROSCOPIC
Bilirubin Urine: NEGATIVE
Glucose, UA: >=500 mg/dL — AB
Hgb urine dipstick: NEGATIVE
Ketones, ur: NEGATIVE mg/dL
Leukocytes,Ua: NEGATIVE
Nitrite: NEGATIVE
Protein, ur: NEGATIVE mg/dL
Specific Gravity, Urine: 1.02 (ref 1.005–1.030)
pH: 6 (ref 5.0–8.0)

## 2023-07-10 LAB — URINALYSIS, MICROSCOPIC (REFLEX)

## 2023-07-10 LAB — PREGNANCY, URINE: Preg Test, Ur: NEGATIVE

## 2023-07-10 LAB — LIPASE, BLOOD: Lipase: 47 U/L (ref 11–51)

## 2023-07-10 MED ORDER — FENTANYL CITRATE PF 50 MCG/ML IJ SOSY
50.0000 ug | PREFILLED_SYRINGE | Freq: Once | INTRAMUSCULAR | Status: AC
  Administered 2023-07-10: 50 ug via INTRAVENOUS
  Filled 2023-07-10: qty 1

## 2023-07-10 MED ORDER — MORPHINE SULFATE (PF) 4 MG/ML IV SOLN
4.0000 mg | Freq: Once | INTRAVENOUS | Status: AC
  Administered 2023-07-10: 4 mg via INTRAVENOUS
  Filled 2023-07-10: qty 1

## 2023-07-10 MED ORDER — IOHEXOL 300 MG/ML  SOLN
125.0000 mL | Freq: Once | INTRAMUSCULAR | Status: AC | PRN
  Administered 2023-07-10: 125 mL via INTRAVENOUS

## 2023-07-10 MED ORDER — METOCLOPRAMIDE HCL 5 MG/ML IJ SOLN
10.0000 mg | Freq: Once | INTRAMUSCULAR | Status: AC
  Administered 2023-07-10: 10 mg via INTRAVENOUS
  Filled 2023-07-10: qty 2

## 2023-07-10 MED ORDER — ALUM & MAG HYDROXIDE-SIMETH 200-200-20 MG/5ML PO SUSP
30.0000 mL | Freq: Once | ORAL | Status: AC
  Administered 2023-07-10: 30 mL via ORAL
  Filled 2023-07-10: qty 30

## 2023-07-10 MED ORDER — MORPHINE SULFATE (PF) 4 MG/ML IV SOLN
6.0000 mg | Freq: Once | INTRAVENOUS | Status: AC
  Administered 2023-07-10: 6 mg via INTRAVENOUS
  Filled 2023-07-10: qty 2

## 2023-07-10 MED ORDER — LIDOCAINE VISCOUS HCL 2 % MT SOLN
15.0000 mL | Freq: Once | OROMUCOSAL | Status: AC
  Administered 2023-07-10: 15 mL via OROMUCOSAL
  Filled 2023-07-10: qty 15

## 2023-07-10 MED ORDER — SODIUM CHLORIDE 0.9 % IV BOLUS
1000.0000 mL | Freq: Once | INTRAVENOUS | Status: AC
  Administered 2023-07-10: 1000 mL via INTRAVENOUS

## 2023-07-10 MED ORDER — ONDANSETRON HCL 4 MG/2ML IJ SOLN
4.0000 mg | Freq: Once | INTRAMUSCULAR | Status: AC
  Administered 2023-07-10: 4 mg via INTRAVENOUS
  Filled 2023-07-10: qty 2

## 2023-07-10 NOTE — Discharge Instructions (Signed)
Continue your medications as prescribed. Keep your scheduled appointment with primary care this week for recheck.

## 2023-07-10 NOTE — ED Provider Notes (Signed)
[place] [facility] Provider Note   CSN: [id number] Arrival date & time: 07/10/23  1311     History  Chief Complaint  Patient presents with   Abdominal Pain    Elizabeth [name] is a 47 y.o. female.  Patient to ED for pain relief of abdominal pain that is recurrent. She underwent a gastric bypass in December of 2023 and started having abdominal pain and nausea in January that has been extensively studied. This episode started 4 days ago with pain across the upper abdomen with nausea and vomiting that is characteristic of previous episodes. No fever. No chest pain. She reports issues with constipation with her last bowel movement 3 days ago. No urinary symptoms. She is taking her usual medications which include Pepcid, Nexium, Tramadol compliantly. Last seen by GI (Dr. Elvina Mattes) 07/04/23.   The history is provided by the patient. No language interpreter was used.  Abdominal Pain      Home Medications Prior to Admission medications   Medication Sig Start Date End Date Taking? Authorizing Provider  Blood Glucose Monitoring Suppl (FREESTYLE LITE) w/Device KIT Use to check blood sugar 4 times a day as directed 05/10/22   Sandford Craze, NP  CALCIUM-VITAMIN D PO Take 2 tablets by mouth every evening.    [provider]  Continuous Glucose Sensor (DEXCOM G7 SENSOR) MISC 1 Device by Does not apply route continuous. 06/03/23   Altamese Marinette, MD  dapagliflozin propanediol (FARXIGA) 5 MG TABS tablet Take 1 tablet (5 mg total) by mouth daily before breakfast. 06/12/23   Motwani, Carin Hock, MD  escitalopram (LEXAPRO) 20 MG tablet TAKE 1 TABLET(20 MG) BY MOUTH DAILY 05/18/23   Worthy Rancher B, FNP  famotidine (PEPCID) 20 MG tablet Take 1 tablet (20 mg total) by mouth 2 (two) times daily. 05/14/23   Sandford Craze, NP  gabapentin (NEURONTIN) 300 MG capsule Take 1 capsule (300 mg total) by mouth 3 (three) times daily. 05/29/23   Sandford Craze, NP   glucose blood (FREESTYLE LITE) test strip Use to check blood sugar 4 times a day as directed 10/20/22   Sandford Craze, NP  hydrOXYzine (ATARAX) 25 MG tablet Take 2 tablets by mouth at bedtime as needed for insomnia. 06/19/23 06/18/24  Sandford Craze, NP  Lancets (FREESTYLE) lancets Use to check blood sugar 4 times a day as directed 05/10/22     lisinopril (ZESTRIL) 5 MG tablet Take 1 tablet (5 mg total) by mouth daily. 05/28/23   Sandford Craze, NP  metFORMIN (GLUCOPHAGE-XR) 500 MG 24 hr tablet Take 1 tablet (500 mg total) by mouth 2 (two) times daily with a meal. 06/12/23   Motwani, Carin Hock, MD  Multiple Vitamin (MULTIVITAMIN WITH MINERALS) TABS tablet Take 2 tablets by mouth every evening.    [provider]  ondansetron (ZOFRAN-ODT) 4 MG disintegrating tablet Take 1 tablet (4 mg total) by mouth every 8 (eight) hours as needed for nausea or vomiting. 05/15/23   Sandford Craze, NP  RABEprazole (ACIPHEX) 20 MG tablet Take 1 tablet (20 mg total) by mouth daily. 03/24/23   Cirigliano, Vito V, DO  rosuvastatin (CRESTOR) 5 MG tablet Take 1 tablet (5 mg total) by mouth daily. 06/03/23   Motwani, Carin Hock, MD  sitaGLIPtin (JANUVIA) 100 MG tablet Take 1 tablet (100 mg total) by mouth daily. 03/10/23   Sandford Craze, NP  traMADol (ULTRAM) 50 MG tablet Take 1 tablet (50 mg total) by mouth every 8 (eight) hours as needed. 06/24/23   Sandford Craze,  NP  traZODone (DESYREL) 50 MG tablet 1/2 to 1 tab by mouth at bedtime as needed for sleep 09/04/20 11/20/20  Sandford Craze, NP      Allergies    Tomato    Review of Systems   Review of Systems  Gastrointestinal:  Positive for abdominal pain.    Physical Exam Updated Vital Signs BP 134/81   Pulse 79   Temp 99.5 F (37.5 C) (Oral)   Resp 18   Ht 5\' 2"  (1.575 m)   Wt 122.9 kg   LMP 04/21/2023 (Approximate)   SpO2 98%   BMI 49.57 kg/m  Physical Exam Vitals and nursing note reviewed.  Constitutional:      Appearance:  She is well-developed. She is obese.  Cardiovascular:     Rate and Rhythm: Normal rate and regular rhythm.     Heart sounds: No murmur heard. Abdominal:     Palpations: Abdomen is soft.     Tenderness: There is abdominal tenderness in the right upper quadrant, epigastric area and left upper quadrant.  Skin:    General: Skin is warm and dry.  Neurological:     Mental Status: She is alert and oriented to person, place, and time.     ED Results / Procedures / Treatments   Labs (all labs ordered are listed, but only abnormal results are displayed) Labs Reviewed  COMPREHENSIVE METABOLIC PANEL - Abnormal; Notable for the following components:      Result Value   Glucose, Bld 172 (*)    All other components within normal limits  CBC - Abnormal; Notable for the following components:   RBC 5.53 (*)    MCV 75.6 (*)    MCH 23.1 (*)    All other components within normal limits  URINALYSIS, ROUTINE W REFLEX MICROSCOPIC - Abnormal; Notable for the following components:   APPearance HAZY (*)    Glucose, UA >=500 (*)    All other components within normal limits  URINALYSIS, MICROSCOPIC (REFLEX) - Abnormal; Notable for the following components:   Bacteria, UA RARE (*)    All other components within normal limits  LIPASE, BLOOD  PREGNANCY, URINE   Results for orders placed or performed during the hospital encounter of 07/10/23 (from the past 24 hour(s))  Lipase, blood     Status: None   Collection Time: 07/10/23  1:26 PM  Result Value Ref Range   Lipase 47 11 - 51 U/L  Comprehensive metabolic panel     Status: Abnormal   Collection Time: 07/10/23  1:26 PM  Result Value Ref Range   Sodium 138 135 - 145 mmol/L   Potassium 4.5 3.5 - 5.1 mmol/L   Chloride 102 98 - 111 mmol/L   CO2 24 22 - 32 mmol/L   Glucose, Bld 172 (H) 70 - 99 mg/dL   BUN 13 6 - 20 mg/dL   Creatinine, Ser 1.61 0.44 - 1.00 mg/dL   Calcium 9.5 8.9 - 09.6 mg/dL   Total Protein 7.9 6.5 - 8.1 g/dL   Albumin 3.8 3.5 - 5.0  g/dL   AST 27 15 - 41 U/L   ALT 28 0 - 44 U/L   Alkaline Phosphatase 104 38 - 126 U/L   Total Bilirubin 0.4 0.3 - 1.2 mg/dL   GFR, Estimated >04 >54 mL/min   Anion gap 12 5 - 15  CBC     Status: Abnormal   Collection Time: 07/10/23  1:26 PM  Result Value Ref Range   WBC 6.3  4.0 - 10.5 K/uL   RBC 5.53 (H) 3.87 - 5.11 MIL/uL   Hemoglobin 12.8 12.0 - 15.0 g/dL   HCT 40.9 81.1 - 91.4 %   MCV 75.6 (L) 80.0 - 100.0 fL   MCH 23.1 (L) 26.0 - 34.0 pg   MCHC 30.6 30.0 - 36.0 g/dL   RDW 78.2 95.6 - 21.3 %   Platelets 250 150 - 400 K/uL   nRBC 0.0 0.0 - 0.2 %  Urinalysis, Routine w reflex microscopic -Urine, Clean Catch     Status: Abnormal   Collection Time: 07/10/23  1:42 PM  Result Value Ref Range   Color, Urine YELLOW YELLOW   APPearance HAZY (A) CLEAR   Specific Gravity, Urine 1.020 1.005 - 1.030   pH 6.0 5.0 - 8.0   Glucose, UA >=500 (A) NEGATIVE mg/dL   Hgb urine dipstick NEGATIVE NEGATIVE   Bilirubin Urine NEGATIVE NEGATIVE   Ketones, ur NEGATIVE NEGATIVE mg/dL   Protein, ur NEGATIVE NEGATIVE mg/dL   Nitrite NEGATIVE NEGATIVE   Leukocytes,Ua NEGATIVE NEGATIVE  Pregnancy, urine     Status: None   Collection Time: 07/10/23  1:42 PM  Result Value Ref Range   Preg Test, Ur NEGATIVE NEGATIVE  Urinalysis, Microscopic (reflex)     Status: Abnormal   Collection Time: 07/10/23  1:42 PM  Result Value Ref Range   RBC / HPF 0-5 0 - 5 RBC/hpf   WBC, UA 0-5 0 - 5 WBC/hpf   Bacteria, UA RARE (A) NONE SEEN   Squamous Epithelial / HPF 6-10 0 - 5 /HPF    EKG None  Radiology CT ABDOMEN PELVIS W CONTRAST  Result Date: 07/10/2023 CLINICAL DATA:  Acute abdominal pain EXAM: CT ABDOMEN AND PELVIS WITH CONTRAST TECHNIQUE: Multidetector CT imaging of the abdomen and pelvis was performed using the standard protocol following bolus administration of intravenous contrast. RADIATION DOSE REDUCTION: This exam was performed according to the departmental dose-optimization program which includes  automated exposure control, adjustment of the mA and/or kV according to patient size and/or use of iterative reconstruction technique. CONTRAST:  OMNIPAQUE IOHEXOL 300 MG/ML  SOLN COMPARISON:  02/08/2023 FINDINGS: Lower chest: Lung bases are clear. Hepatobiliary: Liver is within normal limits. Status post cholecystectomy. No intrahepatic or extrahepatic ductal dilatation. Pancreas: Within normal limits. Spleen: Within normal limits. Adrenals/Urinary Tract: Adrenal glands are within normal limits. Kidneys are within normal limits.  No hydronephrosis. Bladder is within normal limits. Stomach/Bowel: Postsurgical changes related to gastric bypass with patent gastrojejunostomy. No evidence of bowel obstruction. Normal appendix (series 2/image 60). No colonic wall thickening or inflammatory changes. Dense contrast in the left colon with mild streak artifact. Vascular/Lymphatic: No evidence of abdominal aortic aneurysm. No suspicious abdominopelvic lymphadenopathy. Reproductive: Uterus is notable for an IUD in satisfactory position. No adnexal masses. Other: No abdominopelvic ascites. Musculoskeletal: Visualized osseous structures are within normal limits. IMPRESSION: Postsurgical changes related to gastric bypass with patent gastrojejunostomy. No evidence of bowel obstruction. IUD in satisfactory position. No CT findings to account for the patient's abdominal pain. Electronically Signed   By: Charline Bills M.D.   On: 07/10/2023 19:14    Procedures Procedures    Medications Ordered in ED Medications  fentaNYL (SUBLIMAZE) injection 50 mcg (50 mcg Intravenous Given 07/10/23 1444)  ondansetron (ZOFRAN) injection 4 mg (4 mg Intravenous Given 07/10/23 1444)  sodium chloride 0.9 % bolus 1,000 mL (0 mLs Intravenous Stopped 07/10/23 1718)  alum & mag hydroxide-simeth (MAALOX/MYLANTA) 200-200-20 MG/5ML suspension 30 mL (30 mLs Oral Given  07/10/23 1616)  lidocaine (XYLOCAINE) 2 % viscous mouth solution 15 mL (15  mLs Mouth/Throat Given 07/10/23 1616)  morphine (PF) 4 MG/ML injection 4 mg (4 mg Intravenous Given 07/10/23 1633)  metoCLOPramide (REGLAN) injection 10 mg (10 mg Intravenous Given 07/10/23 1653)  morphine (PF) 4 MG/ML injection 6 mg (6 mg Intravenous Given 07/10/23 1734)  iohexol (OMNIPAQUE) 300 MG/ML solution 125 mL (125 mLs Intravenous Contrast Given 07/10/23 1740)    ED Course/ Medical Decision Making/ A&P Clinical Course as of 07/10/23 1926  Thu Jul 10, 2023  1444 Patient with recurrent abdominal pain as described in the HPI. Medications ordered for symptom relief. Labs pending.  [SU]  1530 Recheck after medications. Nausea is resolved. Pain better but still present. Labs reviewed and are reassuring without significant abnormality. [SU]  1624 Patient recheck after GI cocktail with lidocaine - pain no better.  [SU]  1721 Recheck - she continues to have significant pain, crying to family at bedside. Consider CT scan given very infrequent use of ED, uncontrolled pain in a patient with significant abdominal surgical history.  [SU]  1923 Pain improving on recheck. CT showing no acute findings - no obstruction, perforation, inflammation. She is felt appropriate for discharge home, GI follow up.  [SU]    Clinical Course User Index [SU] Elpidio Anis, PA-C                                 Medical Decision Making Amount and/or Complexity of Data Reviewed External Data Reviewed: radiology and notes.    Details: Gastric bypass 12/24 CT abd/pel 4/24, negative CT chest 6/24, negative Barium Swallow with sm bowel follow thru 9/24, negative Upper GI series 2/24, negative Last EGD 5/24, gastritis Labs: ordered. Radiology: ordered.  Risk OTC drugs. Prescription drug management.           Final Clinical Impression(s) / ED Diagnoses Final diagnoses:  Pain of upper abdomen    Rx / DC Orders ED Discharge Orders     None         Elpidio Anis, PA-C 07/10/23 1927    Melene Plan, DO 07/10/23 1938

## 2023-07-10 NOTE — ED Triage Notes (Signed)
Pt reports ongoing "issues" with her stomach since Jan, weight loss surgery & hiatal hernia repair on 09/30/22. Ongoing n/v, ABD pain since. Reports epigastric pain that radiates to the back w nausea today, denies vomiting and diarrhea. Denies CP/ShoB.

## 2023-07-11 ENCOUNTER — Ambulatory Visit: Payer: Medicaid Other | Admitting: Family

## 2023-07-11 ENCOUNTER — Encounter: Payer: Self-pay | Admitting: Family

## 2023-07-11 MED ORDER — LISINOPRIL 5 MG PO TABS: 5.0000 mg | ORAL_TABLET | Freq: Every day | ORAL | 1 refills | Status: DC

## 2023-07-14 ENCOUNTER — Encounter (HOSPITAL_BASED_OUTPATIENT_CLINIC_OR_DEPARTMENT_OTHER): Payer: Self-pay | Admitting: Emergency Medicine

## 2023-07-14 ENCOUNTER — Emergency Department (HOSPITAL_BASED_OUTPATIENT_CLINIC_OR_DEPARTMENT_OTHER)
Admission: EM | Admit: 2023-07-14 | Discharge: 2023-07-14 | Disposition: A | Discharge status: Home / Self Care | Payer: Medicaid Other | Attending: Emergency Medicine | Admitting: Emergency Medicine

## 2023-07-14 ENCOUNTER — Other Ambulatory Visit: Payer: Self-pay

## 2023-07-14 DIAGNOSIS — E119 Type 2 diabetes mellitus without complications: Secondary | ICD-10-CM | POA: Insufficient documentation

## 2023-07-14 DIAGNOSIS — R1010 Upper abdominal pain, unspecified: Secondary | ICD-10-CM | POA: Diagnosis present

## 2023-07-14 DIAGNOSIS — R1084 Generalized abdominal pain: Secondary | ICD-10-CM | POA: Diagnosis not present

## 2023-07-14 DIAGNOSIS — I10 Essential (primary) hypertension: Secondary | ICD-10-CM | POA: Diagnosis not present

## 2023-07-14 DIAGNOSIS — G8929 Other chronic pain: Secondary | ICD-10-CM

## 2023-07-14 LAB — COMPREHENSIVE METABOLIC PANEL
ALT: 109 U/L — ABNORMAL HIGH (ref 0–44)
AST: 36 U/L (ref 15–41)
Albumin: 3.8 g/dL (ref 3.5–5.0)
Alkaline Phosphatase: 118 U/L (ref 38–126)
Anion gap: 14 (ref 5–15)
BUN: 8 mg/dL (ref 6–20)
CO2: 22 mmol/L (ref 22–32)
Calcium: 9.2 mg/dL (ref 8.9–10.3)
Chloride: 101 mmol/L (ref 98–111)
Creatinine, Ser: 0.73 mg/dL (ref 0.44–1.00)
GFR, Estimated: >60 mL/min (ref >60)
Glucose, Bld: 166 mg/dL — ABNORMAL HIGH (ref 70–99)
Potassium: 4.3 mmol/L (ref 3.5–5.1)
Sodium: 137 mmol/L (ref 135–145)
Total Bilirubin: 0.2 mg/dL — ABNORMAL LOW (ref 0.3–1.2)
Total Protein: 7.4 g/dL (ref 6.5–8.1)

## 2023-07-14 LAB — CBC
HCT: 41 % (ref 36.0–46.0)
Hemoglobin: 12.7 g/dL (ref 12.0–15.0)
MCH: 23.4 pg — ABNORMAL LOW (ref 26.0–34.0)
MCHC: 31 g/dL (ref 30.0–36.0)
MCV: 75.5 fL — ABNORMAL LOW (ref 80.0–100.0)
Platelets: 262 10*3/uL (ref 150–400)
RBC: 5.43 MIL/uL — ABNORMAL HIGH (ref 3.87–5.11)
RDW: 14.9 % (ref 11.5–15.5)
WBC: 5.9 10*3/uL (ref 4.0–10.5)
nRBC: 0 % (ref 0.0–0.2)

## 2023-07-14 LAB — LIPASE, BLOOD: Lipase: 75 U/L — ABNORMAL HIGH (ref 11–51)

## 2023-07-14 MED ORDER — DROPERIDOL 2.5 MG/ML IJ SOLN
1.2500 mg | Freq: Once | INTRAMUSCULAR | Status: AC
  Administered 2023-07-14: 1.25 mg via INTRAVENOUS
  Filled 2023-07-14: qty 2

## 2023-07-14 MED ORDER — DIAZEPAM 5 MG/ML IJ SOLN
2.5000 mg | Freq: Once | INTRAMUSCULAR | Status: AC
  Administered 2023-07-14: 2.5 mg via INTRAVENOUS
  Filled 2023-07-14: qty 2

## 2023-07-14 NOTE — Discharge Instructions (Addendum)

## 2023-07-14 NOTE — ED Triage Notes (Signed)
Pt to ER with c/o abdominal pain, back pain and nausea.  Pt states was seen her on Thursday for same.  Was told to follow up with her PCP.  Pt states ongoing issues since Jan.

## 2023-07-14 NOTE — ED Provider Notes (Signed)
[place] [facility] Provider Note   CSN: [id number] Arrival date & time: 07/14/23  1457     History  Chief Complaint  Patient presents with   Abdominal Pain   Back Pain   Nausea    Elizabeth [name] is a 47 y.o. female.She underwent a gastric bypass in December of 2023 and started having abdominal pain and nausea in January that has been extensively studied. This episode started 4 days ago with pain across the upper abdomen with nausea and vomiting that is characteristic of previous episodes. No fever. No chest pain. She reports issues with constipation.  Patient has had 6 CT scans of the abdomen this year alone.  She is currently on PPI, famotidine, and is now taking liquid Carafate.  She has had a gastric emptying study, 2 EGDs, she previously had a PEG tube placed that was removed in February.  She is taking tramadol which is not relieving her pain.  She is scheduled for an MRA next week.  Her primary care doctor told her to come to the emergency department for her pain.  Her pain is the same as her chronic abdominal pain since January has been able to eat without vomiting.  She is status postcholecystectomy.   Abdominal Pain Back Pain Associated symptoms: abdominal pain        Home Medications Prior to Admission medications   Medication Sig Start Date End Date Taking? Authorizing Provider  Blood Glucose Monitoring Suppl (FREESTYLE LITE) w/Device KIT Use to check blood sugar 4 times a day as directed 05/10/22   Sandford Craze, NP  CALCIUM-VITAMIN D PO Take 2 tablets by mouth every evening.    [provider]  Continuous Glucose Sensor (DEXCOM G7 SENSOR) MISC 1 Device by Does not apply route continuous. 06/03/23   Altamese Madaket, MD  dapagliflozin propanediol (FARXIGA) 5 MG TABS tablet Take 1 tablet (5 mg total) by mouth daily before breakfast. 06/12/23   Motwani, Carin Hock, MD  escitalopram (LEXAPRO) 20 MG tablet TAKE 1 TABLET(20 MG) BY  MOUTH DAILY 05/18/23   Worthy Rancher B, FNP  famotidine (PEPCID) 20 MG tablet Take 1 tablet (20 mg total) by mouth 2 (two) times daily. 05/14/23   Sandford Craze, NP  gabapentin (NEURONTIN) 300 MG capsule Take 1 capsule (300 mg total) by mouth 3 (three) times daily. 05/29/23   Sandford Craze, NP  glucose blood (FREESTYLE LITE) test strip Use to check blood sugar 4 times a day as directed 10/20/22   Sandford Craze, NP  hydrOXYzine (ATARAX) 25 MG tablet Take 2 tablets by mouth at bedtime as needed for insomnia. 06/19/23 06/18/24  Sandford Craze, NP  Lancets (FREESTYLE) lancets Use to check blood sugar 4 times a day as directed 05/10/22     lisinopril (ZESTRIL) 5 MG tablet Take 1 tablet (5 mg total) by mouth daily. 07/11/23   Sandford Craze, NP  metFORMIN (GLUCOPHAGE-XR) 500 MG 24 hr tablet Take 1 tablet (500 mg total) by mouth 2 (two) times daily with a meal. 06/12/23   Motwani, Carin Hock, MD  Multiple Vitamin (MULTIVITAMIN WITH MINERALS) TABS tablet Take 2 tablets by mouth every evening.    [provider]  ondansetron (ZOFRAN-ODT) 4 MG disintegrating tablet Take 1 tablet (4 mg total) by mouth every 8 (eight) hours as needed for nausea or vomiting. 05/15/23   Sandford Craze, NP  RABEprazole (ACIPHEX) 20 MG tablet Take 1 tablet (20 mg total) by mouth daily. 03/24/23   Cirigliano, Verlin Dike, DO  rosuvastatin (CRESTOR) 5 MG tablet Take 1 tablet (5 mg total) by mouth daily. 06/03/23   Motwani, Carin Hock, MD  sitaGLIPtin (JANUVIA) 100 MG tablet Take 1 tablet (100 mg total) by mouth daily. 03/10/23   Sandford Craze, NP  traMADol (ULTRAM) 50 MG tablet Take 1 tablet (50 mg total) by mouth every 8 (eight) hours as needed. 06/24/23   Sandford Craze, NP  traZODone (DESYREL) 50 MG tablet 1/2 to 1 tab by mouth at bedtime as needed for sleep 09/04/20 11/20/20  Sandford Craze, NP      Allergies    Tomato    Review of Systems   Review of Systems  Gastrointestinal:  Positive for  abdominal pain.  Musculoskeletal:  Positive for back pain.    Physical Exam Updated Vital Signs BP (!) 143/107 (BP Location: Left Arm)   Pulse 94   Temp 97.7 F (36.5 C)   Resp 20   Ht 5\' 2"  (1.575 m)   Wt 126.6 kg   LMP 04/21/2023 (Approximate)   SpO2 98%   BMI 51.03 kg/m  Physical Exam Vitals and nursing note reviewed.  Constitutional:      General: She is not in acute distress.    Appearance: She is well-developed. She is not diaphoretic.  HENT:     Head: Normocephalic and atraumatic.     Right Ear: External ear normal.     Left Ear: External ear normal.     Nose: Nose normal.     Mouth/Throat:     Mouth: Mucous membranes are moist.  Eyes:     General: No scleral icterus.    Conjunctiva/sclera: Conjunctivae normal.  Cardiovascular:     Rate and Rhythm: Normal rate and regular rhythm.     Heart sounds: Normal heart sounds. No murmur heard.    No friction rub. No gallop.  Pulmonary:     Effort: Pulmonary effort is normal. No respiratory distress.     Breath sounds: Normal breath sounds.  Abdominal:     General: Bowel sounds are normal. There is no distension.     Palpations: Abdomen is soft. There is no mass.     Tenderness: There is generalized abdominal tenderness. There is no guarding.  Musculoskeletal:     Cervical back: Normal range of motion.  Skin:    General: Skin is warm and dry.  Neurological:     Mental Status: She is alert and oriented to person, place, and time.  Psychiatric:        Behavior: Behavior normal.     ED Results / Procedures / Treatments   Labs (all labs ordered are listed, but only abnormal results are displayed) Labs Reviewed  LIPASE, BLOOD - Abnormal; Notable for the following components:      Result Value   Lipase 75 (*)    All other components within normal limits  COMPREHENSIVE METABOLIC PANEL - Abnormal; Notable for the following components:   Glucose, Bld 166 (*)    ALT 109 (*)    Total Bilirubin 0.2 (*)    All other  components within normal limits  CBC - Abnormal; Notable for the following components:   RBC 5.43 (*)    MCV 75.5 (*)    MCH 23.4 (*)    All other components within normal limits  URINALYSIS, ROUTINE W REFLEX MICROSCOPIC  PREGNANCY, URINE    EKG None  Radiology No results found.  Procedures Procedures    Medications Ordered in ED Medications - No data to display  ED Course/ Medical Decision Making/ A&P Clinical Course as of 07/14/23 1841  Mon Jul 14, 2023  1800 Comprehensive metabolic panel(!) [AH]  1800 CBC(!) [AH]  1800 Lipase, blood(!) Mildly elevated [AH]  1801 Urinalysis, Routine w reflex microscopic -Urine, Clean Catch [AH]  6578 Patient reevaluated states that she is feeling significantly better would like to go home [AH]    Clinical Course User Index [AH] Arthor Captain, PA-C                                 Medical Decision Making This patient presents to the ED for concern of abd pain , this involves an extensive number of treatment options, and is a complaint that carries with it a high risk of complications and morbidity.  The differential diagnosis for generalized abdominal pain includes, but is not limited to AAA, gastroenteritis, appendicitis, Bowel obstruction, Bowel perforation. Gastroparesis, DKA, Hernia, Inflammatory bowel disease, mesenteric ischemia, pancreatitis, peritonitis SBP, volvulus.    Co morbidities:      Past Medical History: No date: Anemia No date: Anxiety No date: Astigmatism No date: Depression No date: Diabetes mellitus without complication (HCC) No date: Dry mouth No date: Fatigue No date: GERD (gastroesophageal reflux disease) No date: Heartburn No date: Hepatic steatosis No date: Hepatomegaly No date: High cholesterol No date: Hypertension No date: Keratoconjunctivitis No date: Liver disease No date: Muscle pain 04/21/2023: Nausea and vomiting No date: Ovarian cyst No date: Positive ANA (antinuclear antibody) No  date: Pulmonary nodules No date: Vitamin D deficiency No date: Weakness   Social Determinants of Health:       SDOH Screenings Food Insecurity: Low Risk  (06/12/2023)     Received from [facility] Housing: Low Risk  (02/19/2023) Transportation Needs: No Transportation Needs (06/12/2023)     Received from [facility] Utilities: Low Risk  (06/12/2023)     Received from [facility] Depression (PHQ2-9): Low Risk  (04/29/2022) Financial Resource Strain: Low Risk  (02/19/2023) Physical Activity: Insufficiently Active (02/19/2023) Social Connections: Moderately Integrated (02/19/2023) Stress: No Stress Concern Present (02/19/2023) Tobacco Use: Medium Risk (07/14/2023)   Additional history:  {Additional history obtained from EMR   Lab Tests:  Per ed course     Imaging Studies: N/a   Medicines ordered and prescription drug management:  I ordered medication including Medications droperidol (INAPSINE) 2.5 MG/ML injection 1.25 mg (1.25 mg Intravenous Given 07/14/23 1726) diazepam (VALIUM) injection 2.5 mg (2.5 mg Intravenous Given 07/14/23 1722) for abdomin Reevaluation of the patient after these medicines showed that the patient improved I have reviewed the patients home medicines and have made adjustments as needed  Test Considered:       Considered CT imaging however again has had 6 CT scans.  I do not think she has acute pancreatitis.  Her pain is unchanged from her chronic abdominal pain.  Critical Interventions:         Consultations Obtained:   Problem List / ED Course:       No diagnosis found.  MDM: patient with chronic intractable abdominal pain.  Improved significantly with Dilaudid and Valium no narcotics utilized.  Follow up with specialists.return precautions   Dispostion:  After consideration of the diagnostic results and the patients response to treatment, I feel that the patent would benefit from op f/u.    Amount and/or Complexity of Data  Reviewed Labs: ordered. Decision-making details documented in ED Course.  Risk Prescription drug management.  Final Clinical Impression(s) / ED Diagnoses Final diagnoses:  None    Rx / DC Orders ED Discharge Orders     None         Arthor Captain, PA-C 07/14/23 1842    Charlynne Pander, MD 07/14/23 [phone]

## 2023-07-16 DIAGNOSIS — Z1231 Encounter for screening mammogram for malignant neoplasm of breast: Secondary | ICD-10-CM

## 2023-07-18 ENCOUNTER — Ambulatory Visit: Payer: Medicaid Other | Admitting: Family

## 2023-07-21 ENCOUNTER — Ambulatory Visit: Payer: Medicaid Other | Admitting: Family

## 2023-07-22 ENCOUNTER — Other Ambulatory Visit: Payer: Self-pay | Admitting: Family

## 2023-07-28 ENCOUNTER — Ambulatory Visit: Payer: Medicaid Other | Admitting: Family

## 2023-08-04 ENCOUNTER — Other Ambulatory Visit: Payer: Self-pay | Admitting: Family

## 2023-08-04 DIAGNOSIS — Z1231 Encounter for screening mammogram for malignant neoplasm of breast: Secondary | ICD-10-CM

## 2023-08-05 ENCOUNTER — Encounter (HOSPITAL_BASED_OUTPATIENT_CLINIC_OR_DEPARTMENT_OTHER): Payer: Self-pay

## 2023-08-05 ENCOUNTER — Other Ambulatory Visit (HOSPITAL_BASED_OUTPATIENT_CLINIC_OR_DEPARTMENT_OTHER): Payer: Self-pay

## 2023-08-05 ENCOUNTER — Ambulatory Visit: Payer: Medicaid Other | Admitting: Family

## 2023-08-05 ENCOUNTER — Other Ambulatory Visit: Payer: Self-pay

## 2023-08-05 MED ORDER — SUCRALFATE 1 GM/10ML PO SUSP
1.0000 g | Freq: Four times a day (QID) | ORAL | 0 refills | Status: DC
  Filled 2023-08-05 – 2023-08-24 (×2): qty 1200, 30d supply, fill #0

## 2023-08-06 ENCOUNTER — Other Ambulatory Visit (HOSPITAL_BASED_OUTPATIENT_CLINIC_OR_DEPARTMENT_OTHER): Payer: Self-pay

## 2023-08-10 ENCOUNTER — Encounter: Payer: Self-pay | Admitting: Family

## 2023-08-11 ENCOUNTER — Ambulatory Visit: Payer: Medicaid Other | Admitting: Family

## 2023-08-12 ENCOUNTER — Other Ambulatory Visit (HOSPITAL_BASED_OUTPATIENT_CLINIC_OR_DEPARTMENT_OTHER): Payer: Self-pay

## 2023-08-12 ENCOUNTER — Other Ambulatory Visit: Payer: Self-pay

## 2023-08-12 ENCOUNTER — Other Ambulatory Visit: Payer: Self-pay | Admitting: Family

## 2023-08-12 MED ORDER — ESOMEPRAZOLE MAGNESIUM 40 MG PO CPDR
40.0000 mg | DELAYED_RELEASE_CAPSULE | Freq: Two times a day (BID) | ORAL | 3 refills | Status: AC
  Filled 2023-08-12 – 2023-08-25 (×3): qty 180, 90d supply, fill #0

## 2023-08-15 ENCOUNTER — Other Ambulatory Visit (HOSPITAL_BASED_OUTPATIENT_CLINIC_OR_DEPARTMENT_OTHER): Payer: Self-pay

## 2023-08-18 ENCOUNTER — Ambulatory Visit: Payer: Medicaid Other | Admitting: Family

## 2023-08-18 ENCOUNTER — Other Ambulatory Visit (HOSPITAL_BASED_OUTPATIENT_CLINIC_OR_DEPARTMENT_OTHER): Payer: Self-pay

## 2023-08-22 ENCOUNTER — Other Ambulatory Visit (HOSPITAL_BASED_OUTPATIENT_CLINIC_OR_DEPARTMENT_OTHER): Payer: Self-pay

## 2023-08-25 ENCOUNTER — Ambulatory Visit (INDEPENDENT_AMBULATORY_CARE_PROVIDER_SITE_OTHER): Payer: Medicaid Other | Admitting: Family

## 2023-08-25 ENCOUNTER — Other Ambulatory Visit (HOSPITAL_BASED_OUTPATIENT_CLINIC_OR_DEPARTMENT_OTHER): Payer: Self-pay

## 2023-08-25 ENCOUNTER — Other Ambulatory Visit: Payer: Self-pay

## 2023-08-25 VITALS — BP 136/87 | HR 97 | Temp 98.7°F | Resp 16 | Ht 63.0 in | Wt 271.0 lb

## 2023-08-25 DIAGNOSIS — E1165 Type 2 diabetes mellitus with hyperglycemia: Secondary | ICD-10-CM

## 2023-08-25 DIAGNOSIS — R7989 Other specified abnormal findings of blood chemistry: Secondary | ICD-10-CM | POA: Diagnosis not present

## 2023-08-25 DIAGNOSIS — R748 Abnormal levels of other serum enzymes: Secondary | ICD-10-CM

## 2023-08-25 DIAGNOSIS — R768 Other specified abnormal immunological findings in serum: Secondary | ICD-10-CM

## 2023-08-25 DIAGNOSIS — F32A Depression, unspecified: Secondary | ICD-10-CM

## 2023-08-25 DIAGNOSIS — I10 Essential (primary) hypertension: Secondary | ICD-10-CM | POA: Diagnosis not present

## 2023-08-25 DIAGNOSIS — G4700 Insomnia, unspecified: Secondary | ICD-10-CM | POA: Insufficient documentation

## 2023-08-25 DIAGNOSIS — Z23 Encounter for immunization: Secondary | ICD-10-CM

## 2023-08-25 DIAGNOSIS — G8929 Other chronic pain: Secondary | ICD-10-CM

## 2023-08-25 DIAGNOSIS — Z7984 Long term (current) use of oral hypoglycemic drugs: Secondary | ICD-10-CM

## 2023-08-25 DIAGNOSIS — R109 Unspecified abdominal pain: Secondary | ICD-10-CM

## 2023-08-25 LAB — COMPREHENSIVE METABOLIC PANEL WITH GFR
ALT: 27 U/L (ref 0–35)
AST: 24 U/L (ref 0–37)
Albumin: 3.9 g/dL (ref 3.5–5.2)
Alkaline Phosphatase: 94 U/L (ref 39–117)
BUN: 10 mg/dL (ref 6–23)
CO2: 26 meq/L (ref 19–32)
Calcium: 9.2 mg/dL (ref 8.4–10.5)
Chloride: 103 meq/L (ref 96–112)
Creatinine, Ser: 0.78 mg/dL (ref 0.40–1.20)
GFR: 91.87 mL/min
Glucose, Bld: 184 mg/dL — ABNORMAL HIGH (ref 70–99)
Potassium: 4.4 meq/L (ref 3.5–5.1)
Sodium: 138 meq/L (ref 135–145)
Total Bilirubin: 0.3 mg/dL (ref 0.2–1.2)
Total Protein: 6.9 g/dL (ref 6.0–8.3)

## 2023-08-25 LAB — LIPASE: Lipase: 51 U/L (ref 11.0–59.0)

## 2023-08-25 NOTE — Assessment & Plan Note (Addendum)
Notes some help with lexapro.  Anxiety at night is worse.

## 2023-08-25 NOTE — Assessment & Plan Note (Signed)
She is taking hydroxyzine 50mg  HS.  Falls asleep but wakes up every hour.  She is not overly concerned about this. Will monitor.

## 2023-08-25 NOTE — Assessment & Plan Note (Signed)
Stable on lisinopril 5 mg. Continue same.

## 2023-08-25 NOTE — Patient Instructions (Signed)
VISIT SUMMARY:  You came in today for a follow-up on your blood pressure and chronic abdominal pain. We discussed the results of your recent endoscopy and colonoscopy, which showed marginal ulcers at the site of your weight loss surgery. We also reviewed your diabetes management, sleep issues, and general health maintenance.  YOUR PLAN:  -MARGINAL ULCERS: Marginal ulcers are sores that developed at the site of your previous weight loss surgery. You are currently taking Carafate solution, esomeprazole, and Pepcid to manage these ulcers. We will repeat the endoscopy in three months to check on your progress. If the condition worsens or does not improve, surgery may be necessary. Please continue your current medication regimen and follow up with your surgeon as directed.  -HYPERTENSION: Hypertension, or high blood pressure, is being well-controlled with a low dose of Lisinopril. Please continue taking Lisinopril as prescribed.  -TYPE 2 DIABETES MELLITUS: Type 2 Diabetes Mellitus is a condition where your body does not use insulin properly, leading to high blood sugar levels. Your last A1C in August was 8.7, and your home glucose readings have been under 200. You are currently taking Januvia, Metformin, and Farxiga. We will check your A1C again in December. Please continue your current medication regimen.  -CHRONIC ABDOMINAL PAIN: Your chronic abdominal pain is likely due to the marginal ulcers. You are also taking Gabapentin for pain control. Please continue taking Gabapentin as needed for pain.  -INSOMNIA: Insomnia is difficulty in maintaining sleep. Despite using Hydroxyzine, you are still waking up frequently. You have expressed no interest in stronger sleep aids. Please continue taking Hydroxyzine as needed for sleep.  -GENERAL HEALTH MAINTENANCE: For your general health, you received the influenza vaccine today. We will repeat your liver function tests and follow up in four  months.  INSTRUCTIONS:  Please follow up with your surgeon as directed for your marginal ulcers. We will repeat your endoscopy in three months. Continue your current medication regimens for hypertension, diabetes, and chronic pain. We will check your A1C again in December. Repeat liver function tests have been ordered. Follow up in four months for a general health check.

## 2023-08-25 NOTE — Assessment & Plan Note (Signed)
Lab Results  Component Value Date   HGBA1C 8.7 (H) 05/28/2023   HGBA1C 9.1 (H) 03/07/2023   HGBA1C 7.4 (H) 12/02/2022   Lab Results  Component Value Date   MICROALBUR 5.7 (H) 05/28/2023   LDLCALC 142 (H) 05/28/2023   CREATININE 0.73 07/14/2023   On Januvia, metformin and farxiga.  Sees Endo in December.

## 2023-08-25 NOTE — Assessment & Plan Note (Signed)
Saw Dr. Dimple Casey and work up was not felt to be consistent with autoimmune disease.

## 2023-08-25 NOTE — Addendum Note (Signed)
Addended by: Wilford Corner on: 08/25/2023 12:25 PM   Modules accepted: Orders

## 2023-08-25 NOTE — Assessment & Plan Note (Signed)
Most likely due to the ulcers found on endoscopy. She continues carafate, nexium, pepcid and plan is for her to follow back up with her surgeon in 3 months for repeat endoscopy.

## 2023-08-25 NOTE — Progress Notes (Signed)
Subjective:     Patient ID: Elizabeth [name], female    DOB: October 20, 1976, 47 y.o.   MRN: [id number]  Chief Complaint  Patient presents with   Hypertension    Follow up    Hypertension    Discussed the use of AI scribe software for clinical note transcription with the patient, who gave verbal consent to proceed.  The patient, with a history of weight loss surgery, presents for a follow-up of blood pressure and chronic abdominal pain. She recently underwent an endoscopy, which revealed marginal ulcers in the proximal roux site. The patient is currently on a medication regimen prescribed by her surgeon, including Carafate solution, esomeprazole, and Pepcid, to manage the ulcers. The patient prefers to avoid surgery if possible.  The patient also reports that she is working with a dietician due to pain with eating and stationary weight. She has a history of LFT and Lipase back in September and is due for a repeat test.  The patient has a history of diabetes, with an A1C of 8.7 in August. She is on Januvia, metformin, and Farxiga, and her home glucose readings have been under 200. She is followed by Endocrinology and next visit is in December.   The patient also reports issues with sleep, waking up every hour despite taking hydroxyzine. She does note that she is able to fall asleep ok. She doesn't feel that the waking is a major issue for her.   She has been feeling down but reports that it usually passes quickly. She is on Lexapro for mood and has been talking with her family for support.  The patient also has a history of chronic pain and is on gabapentin. She reports that the abdominal pain is expected to persist for a while.  Wt Readings from Last 3 Encounters:  08/25/23 271 lb (122.9 kg)  07/14/23 279 lb (126.6 kg)  07/10/23 271 lb (122.9 kg)       Health Maintenance Due  Topic Date Due   OPHTHALMOLOGY EXAM  12/12/2022   INFLUENZA VACCINE  05/22/2023   COVID-19 Vaccine (4 -  2023-24 season) 06/22/2023    Past Medical History:  Diagnosis Date   Anemia    Anxiety    Astigmatism    Depression    Diabetes mellitus without complication (HCC)    Dry mouth    Fatigue    GERD (gastroesophageal reflux disease)    Heartburn    Hepatic steatosis    Hepatomegaly    High cholesterol    Hypertension    Keratoconjunctivitis    Liver disease    Muscle pain    Nausea and vomiting 04/21/2023   Ovarian cyst    Positive ANA (antinuclear antibody)    Pulmonary nodules    Vitamin D deficiency    Weakness     Past Surgical History:  Procedure Laterality Date   BIOPSY  01/18/2021   Procedure: BIOPSY;  Surgeon: Shellia Cleverly, DO;  Location: [facility];  Service: Gastroenterology;;   CHOLECYSTECTOMY N/A 05/11/2021   Procedure: LAPAROSCOPIC CHOLECYSTECTOMY WITH INTRAOPERATIVE CHOLANGIOGRAM;  Surgeon: Quentin Ore, MD;  Location: [facility];  Service: General;  Laterality: N/A;   DILATION AND CURETTAGE OF UTERUS     ENDOMETRIAL BIOPSY  12/19/2017   normal per pt   ESOPHAGOGASTRODUODENOSCOPY (EGD) WITH PROPOFOL N/A 01/18/2021   Procedure: ESOPHAGOGASTRODUODENOSCOPY (EGD) WITH PROPOFOL;  Surgeon: Shellia Cleverly, DO;  Location: [facility];  Service: Gastroenterology;  Laterality: N/A;   HIATAL HERNIA REPAIR N/A  09/30/2022   Procedure: HERNIA REPAIR HIATAL;  Surgeon: Quentin Ore, MD;  Location: [facility];  Service: General;  Laterality: N/A;   LAPAROSCOPIC INSERTION GASTROSTOMY TUBE N/A 12/02/2022   Procedure: LAPAROSCOPIC REMNANT GASTROSTOMY G TUBE;  Surgeon: Quentin Ore, MD;  Location: [facility];  Service: General;  Laterality: N/A;   LAPAROSCOPIC ROUX-EN-Y GASTRIC BYPASS WITH HIATAL HERNIA REPAIR  09/30/2022   LAPAROSCOPY N/A 12/02/2022   Procedure: LAPAROSCOPY DIAGNOSTIC;  Surgeon: Quentin Ore, MD;  Location: [facility];  Service: General;  Laterality: N/A;   TONSILLECTOMY     TUBAL LIGATION     UPPER GI ENDOSCOPY N/A 09/30/2022    Procedure: UPPER GI ENDOSCOPY;  Surgeon: Quentin Ore, MD;  Location: [facility];  Service: General;  Laterality: N/A;   UPPER GI ENDOSCOPY N/A 12/02/2022   Procedure: UPPER ENDOSCOPY;  Surgeon: Quentin Ore, MD;  Location: [facility];  Service: General;  Laterality: N/A;    Family History  Problem Relation Age of Onset   Diabetes Mother    Hypertension Mother    Hyperlipidemia Mother    Obesity Mother    Peripheral Artery Disease Mother    HIV Father        died from complications (IVDU)   Alcohol abuse Father    Drug abuse Father    Lupus Maternal Aunt    Ovarian cancer Maternal Grandmother    Colon cancer Neg Hx    Esophageal cancer Neg Hx    Liver disease Neg Hx    Pancreatic cancer Neg Hx    Stomach cancer Neg Hx    Sleep apnea Neg Hx    Rectal cancer Neg Hx     Social History   Socioeconomic History   Marital status: Widowed    Spouse name: Mellody Dance   Number of children: 2   Years of education: Not on file   Highest education level: Associate degree: occupational, Scientist, product/process development, or vocational program  Occupational History   Occupation: CMA - Geophysicist/field seismologist: [employer]  Tobacco Use   Smoking status: Former    Current packs/day: 0.00    Average packs/day: 0.5 packs/day for 3.0 years (1.5 ttl pk-yrs)    Types: Cigarettes    Start date: 08/21/2018    Quit date: 08/21/2021    Years since quitting: 2.0    Passive exposure: Never   Smokeless tobacco: Never   Tobacco comments:    4-5/day  Vaping Use   Vaping status: Never Used  Substance and Sexual Activity   Alcohol use: No   Drug use: No   Sexual activity: Yes    Partners: Male    Birth control/protection: Surgical, Implant  Other Topics Concern   Not on file  Social History Narrative    2 children   1997- son Collier Bullock   2000- son Designer, jewellery in [place] [facility]- CMA in [place]   Enjoys reading   Widowed 07/26/2023, husband died from COVID-19.   Social Determinants of Health    Financial Resource Strain: Medium Risk (08/18/2023)   Overall Financial Resource Strain (CARDIA)    Difficulty of Paying Living Expenses: Somewhat hard  Food Insecurity: No Food Insecurity (08/18/2023)   Hunger Vital Sign    Worried About Running Out of Food in the Last Year: Never true    Ran Out of Food in the Last Year: Never true  Transportation Needs: No Transportation Needs (08/18/2023)   PRAPARE - Transportation    Lack of  Transportation (Medical): No    Lack of Transportation (Non-Medical): No  Physical Activity: Inactive (08/18/2023)   Exercise Vital Sign    Days of Exercise per Week: 0 days    Minutes of Exercise per Session: 10 min  Stress: Stress Concern Present (08/18/2023)   [employer] of Occupational Health - Occupational Stress Questionnaire    Feeling of Stress : To some extent  Social Connections: Moderately Isolated (08/18/2023)   Social Connection and Isolation Panel [NHANES]    Frequency of Communication with Friends and Family: Twice a week    Frequency of Social Gatherings with Friends and Family: Never    Attends Religious Services: More than 4 times per year    Active Member of [employer] or Organizations: No    Attends Engineer, structural: More than 4 times per year    Marital Status: Widowed  Intimate Partner Violence: Not At Risk (12/02/2022)   Humiliation, Afraid, Rape, and Kick questionnaire    Fear of Current or Ex-Partner: No    Emotionally Abused: No    Physically Abused: No    Sexually Abused: No    Outpatient Medications Prior to Visit  Medication Sig Dispense Refill   Blood Glucose Monitoring Suppl (FREESTYLE LITE) w/Device KIT Use to check blood sugar 4 times a day as directed 1 kit 0   CALCIUM-VITAMIN D PO Take 2 tablets by mouth every evening.     dapagliflozin propanediol (FARXIGA) 5 MG TABS tablet Take 1 tablet (5 mg total) by mouth daily before breakfast. 90 tablet 1   escitalopram (LEXAPRO) 20 MG tablet TAKE 1 TABLET(20  MG) BY MOUTH DAILY 90 tablet 0   esomeprazole (NEXIUM) 40 MG capsule Take 1 capsule (40 mg total) by mouth 2 (two) times daily. Open capsule and sprinkle on food at breakfast and dinner for better absorption. 180 capsule 3   famotidine (PEPCID) 20 MG tablet Take 1 tablet (20 mg total) by mouth 2 (two) times daily. 180 tablet 0   gabapentin (NEURONTIN) 300 MG capsule Take 1 capsule (300 mg total) by mouth 3 (three) times daily. 90 capsule 3   glucose blood (FREESTYLE LITE) test strip Use to check blood sugar 4 times a day as directed 100 each 5   HYDROcodone-acetaminophen (NORCO) 10-325 MG tablet Take by mouth.     hydrOXYzine (ATARAX) 25 MG tablet Take 2 tablets by mouth at bedtime as needed for insomnia. 60 tablet 3   Lancets (FREESTYLE) lancets Use to check blood sugar 4 times a day as directed 100 each 0   lisinopril (ZESTRIL) 5 MG tablet Take 1 tablet (5 mg total) by mouth daily. 90 tablet 1   metFORMIN (GLUCOPHAGE-XR) 500 MG 24 hr tablet Take 1 tablet (500 mg total) by mouth 2 (two) times daily with a meal. 60 tablet 4   Multiple Vitamin (MULTIVITAMIN WITH MINERALS) TABS tablet Take 2 tablets by mouth every evening.     ondansetron (ZOFRAN-ODT) 4 MG disintegrating tablet DISSOLVE 1 TABLET(4 MG) ON THE TONGUE EVERY 8 HOURS AS NEEDED FOR NAUSEA OR VOMITING 90 tablet 1   rosuvastatin (CRESTOR) 5 MG tablet Take 1 tablet (5 mg total) by mouth daily. 90 tablet 1   sitaGLIPtin (JANUVIA) 100 MG tablet Take 1 tablet (100 mg total) by mouth daily. 30 tablet 5   sucralfate (CARAFATE) 1 GM/10ML suspension Take 10 mLs (1 g total) by mouth 4 (four) times daily. 1200 mL 0   Continuous Glucose Sensor (DEXCOM G7 SENSOR) MISC 1 Device by  Does not apply route continuous. 9 each 0   RABEprazole (ACIPHEX) 20 MG tablet Take 1 tablet (20 mg total) by mouth daily. 90 tablet 3   traMADol (ULTRAM) 50 MG tablet Take 1 tablet (50 mg total) by mouth every 8 (eight) hours as needed. 90 tablet 0   Facility-Administered  Medications Prior to Visit  Medication Dose Route Frequency Provider Last Rate Last Admin   sodium chloride 0.9 % 1,000 mL with thiamine 100 mg, folic acid 1 mg, M.V.I. Adult 10 mL infusion   Intravenous Once Stechschulte, Hyman Hopes, MD       sodium chloride 0.9 % 1,000 mL with thiamine 100 mg, folic acid 1 mg, M.V.I. Adult 10 mL infusion   Intravenous Once Stechschulte, Hyman Hopes, MD       sodium chloride 0.9 % bolus 1,000 mL  1,000 mL Intravenous Once Stechschulte, Hyman Hopes, MD       sodium chloride 0.9 % bolus 1,000 mL  1,000 mL Intravenous Once Stechschulte, Hyman Hopes, MD        Allergies  Allergen Reactions   Tomato Hives, Itching and Rash    ROS See HPI    Objective:    Physical Exam Constitutional:      General: She is not in acute distress.    Appearance: Normal appearance. She is well-developed.  HENT:     Head: Normocephalic and atraumatic.     Right Ear: External ear normal.     Left Ear: External ear normal.  Eyes:     General: No scleral icterus. Neck:     Thyroid: No thyromegaly.  Cardiovascular:     Rate and Rhythm: Normal rate and regular rhythm.     Heart sounds: Normal heart sounds. No murmur heard. Pulmonary:     Effort: Pulmonary effort is normal. No respiratory distress.     Breath sounds: Normal breath sounds. No wheezing.  Musculoskeletal:     Cervical back: Neck supple.  Skin:    General: Skin is warm and dry.  Neurological:     Mental Status: She is alert and oriented to person, place, and time.  Psychiatric:        Mood and Affect: Mood normal.        Behavior: Behavior normal.        Thought Content: Thought content normal.        Judgment: Judgment normal.      BP 136/87 (BP Location: Right Arm, Patient Position: Sitting, Cuff Size: Large)   Pulse 97   Temp 98.7 F (37.1 C) (Oral)   Resp 16   Ht 5\' 3"  (1.6 m)   Wt 271 lb (122.9 kg)   SpO2 98%   BMI 48.01 kg/m  Wt Readings from Last 3 Encounters:  08/25/23 271 lb (122.9 kg)  07/14/23 279  lb (126.6 kg)  07/10/23 271 lb (122.9 kg)       Assessment & Plan:   Problem List Items Addressed This Visit       Unprioritized   Uncontrolled type 2 diabetes mellitus with hyperglycemia (HCC)    Lab Results  Component Value Date   HGBA1C 8.7 (H) 05/28/2023   HGBA1C 9.1 (H) 03/07/2023   HGBA1C 7.4 (H) 12/02/2022   Lab Results  Component Value Date   MICROALBUR 5.7 (H) 05/28/2023   LDLCALC 142 (H) 05/28/2023   CREATININE 0.73 07/14/2023   On Januvia, metformin and farxiga.  Sees Endo in December.       Positive ANA (antinuclear  antibody)    Saw Dr. Dimple Casey and work up was not felt to be consistent with autoimmune disease.      Insomnia    She is taking hydroxyzine 50mg  HS.  Falls asleep but wakes up every hour.  She is not overly concerned about this. Will monitor.       Essential hypertension - Primary    Stable on lisinopril 5mg . Continue same.        Depression    Notes some help with lexapro.  Anxiety at night is worse.        Chronic abdominal pain    Most likely due to the ulcers found on endoscopy. She continues carafate, nexium, pepcid and plan is for her to follow back up with her surgeon in 3 months for repeat endoscopy.       Relevant Medications   HYDROcodone-acetaminophen (NORCO) 10-325 MG tablet   Other Visit Diagnoses     Elevated LFTs       Relevant Orders   Comp Met (CMET)   Elevated lipase       Relevant Orders   Lipase       I have discontinued Ifeoluwa Gibbs's RABEprazole, Dexcom G7 Sensor, and traMADol. I am also having her maintain her FreeStyle Lite, freestyle, FREESTYLE LITE, multivitamin with minerals, CALCIUM-VITAMIN D PO, sitaGLIPtin, famotidine, escitalopram, gabapentin, rosuvastatin, metFORMIN, dapagliflozin propanediol, hydrOXYzine, lisinopril, ondansetron, sucralfate, esomeprazole, and HYDROcodone-acetaminophen.  No orders of the defined types were placed in this encounter.

## 2023-08-28 ENCOUNTER — Telehealth: Payer: Self-pay | Admitting: Family

## 2023-08-28 ENCOUNTER — Other Ambulatory Visit: Payer: Self-pay

## 2023-08-28 ENCOUNTER — Encounter: Payer: Self-pay | Admitting: Family

## 2023-08-28 MED ORDER — FREESTYLE LANCETS MISC: 0 refills | Status: DC

## 2023-08-28 MED ORDER — FREESTYLE LITE TEST VI STRP: ORAL_STRIP | 5 refills | Status: DC

## 2023-08-28 NOTE — Telephone Encounter (Signed)
Marchelle Folks from [facility] pharmacy called and stated that the prescriptions that were sent in today for the patient is not covered by her insurance. She stated that it could be switched to meters. Please call and advise with Pharmacy.

## 2023-08-29 ENCOUNTER — Other Ambulatory Visit: Payer: Self-pay

## 2023-08-29 ENCOUNTER — Other Ambulatory Visit: Payer: Self-pay | Admitting: Family

## 2023-08-29 DIAGNOSIS — E1165 Type 2 diabetes mellitus with hyperglycemia: Secondary | ICD-10-CM

## 2023-08-29 MED ORDER — BLOOD GLUCOSE TEST VI STRP
1.0000 | ORAL_STRIP | Freq: Three times a day (TID) | 0 refills | Status: AC
Start: 2023-08-29 — End: 2023-11-27

## 2023-08-29 MED ORDER — BLOOD GLUCOSE MONITORING SUPPL DEVI: 1.0000 | Freq: Three times a day (TID) | 0 refills | Status: DC

## 2023-08-29 MED ORDER — LANCET DEVICE MISC
1.0000 | Freq: Three times a day (TID) | 0 refills | Status: AC
Start: 2023-08-29 — End: 2023-09-28

## 2023-08-29 NOTE — Telephone Encounter (Signed)
New prescriptions sent in for different brand meter and supplies

## 2023-09-01 ENCOUNTER — Other Ambulatory Visit (HOSPITAL_BASED_OUTPATIENT_CLINIC_OR_DEPARTMENT_OTHER): Payer: Self-pay

## 2023-09-03 ENCOUNTER — Other Ambulatory Visit (HOSPITAL_BASED_OUTPATIENT_CLINIC_OR_DEPARTMENT_OTHER): Payer: Self-pay

## 2023-09-10 ENCOUNTER — Other Ambulatory Visit: Payer: Self-pay | Admitting: Family

## 2023-09-11 ENCOUNTER — Encounter: Payer: Self-pay | Admitting: Family

## 2023-09-11 ENCOUNTER — Other Ambulatory Visit: Payer: Self-pay

## 2023-09-11 MED ORDER — SITAGLIPTIN PHOSPHATE 100 MG PO TABS: 100.0000 mg | ORAL_TABLET | Freq: Every day | ORAL | 5 refills | Status: DC

## 2023-09-16 ENCOUNTER — Encounter: Payer: Self-pay | Admitting: Family

## 2023-09-19 ENCOUNTER — Other Ambulatory Visit: Payer: Self-pay | Admitting: "Endocrinology

## 2023-09-19 ENCOUNTER — Other Ambulatory Visit: Payer: Self-pay | Admitting: Family

## 2023-09-19 DIAGNOSIS — E1165 Type 2 diabetes mellitus with hyperglycemia: Secondary | ICD-10-CM

## 2023-09-20 MED ORDER — ESCITALOPRAM OXALATE 20 MG PO TABS: 20.0000 mg | ORAL_TABLET | Freq: Every day | ORAL | 1 refills | Status: DC

## 2023-09-25 ENCOUNTER — Other Ambulatory Visit: Payer: Self-pay

## 2023-09-25 DIAGNOSIS — E1165 Type 2 diabetes mellitus with hyperglycemia: Secondary | ICD-10-CM

## 2023-09-26 ENCOUNTER — Other Ambulatory Visit: Payer: Self-pay

## 2023-09-26 ENCOUNTER — Other Ambulatory Visit: Payer: Medicaid Other

## 2023-09-27 LAB — LIPID PANEL
Cholesterol: 178 mg/dL (ref <200)
HDL: 61 mg/dL (ref >=50)
LDL Cholesterol (Calc): 94 mg/dL
Non-HDL Cholesterol (Calc): 117 mg/dL (ref <130)
Total CHOL/HDL Ratio: 2.9 (calc) (ref <5.0)
Triglycerides: 133 mg/dL (ref <150)

## 2023-09-27 LAB — HEMOGLOBIN A1C
Hgb A1c MFr Bld: 9 %{Hb} — ABNORMAL HIGH (ref <5.7)
Mean Plasma Glucose: 212 mg/dL
eAG (mmol/L): 11.7 mmol/L

## 2023-09-27 LAB — COMPREHENSIVE METABOLIC PANEL
AG Ratio: 1.4 (calc) (ref 1.0–2.5)
ALT: 25 U/L (ref 6–29)
AST: 22 U/L (ref 10–35)
Albumin: 4.1 g/dL (ref 3.6–5.1)
Alkaline phosphatase (APISO): 107 U/L (ref 31–125)
BUN: 11 mg/dL (ref 7–25)
CO2: 24 mmol/L (ref 20–32)
Calcium: 9.4 mg/dL (ref 8.6–10.2)
Chloride: 104 mmol/L (ref 98–110)
Creat: 0.67 mg/dL (ref 0.50–0.99)
Globulin: 3 g/dL (ref 1.9–3.7)
Glucose, Bld: 173 mg/dL — ABNORMAL HIGH (ref 65–99)
Potassium: 4.4 mmol/L (ref 3.5–5.3)
Sodium: 139 mmol/L (ref 135–146)
Total Bilirubin: 0.2 mg/dL (ref 0.2–1.2)
Total Protein: 7.1 g/dL (ref 6.1–8.1)

## 2023-09-30 ENCOUNTER — Other Ambulatory Visit: Payer: Self-pay | Admitting: Family

## 2023-10-01 ENCOUNTER — Other Ambulatory Visit: Payer: Self-pay | Admitting: Family

## 2023-10-03 ENCOUNTER — Telehealth (INDEPENDENT_AMBULATORY_CARE_PROVIDER_SITE_OTHER): Payer: Medicaid Other | Admitting: "Endocrinology

## 2023-10-03 ENCOUNTER — Encounter: Payer: Self-pay | Admitting: "Endocrinology

## 2023-10-03 DIAGNOSIS — E1165 Type 2 diabetes mellitus with hyperglycemia: Secondary | ICD-10-CM

## 2023-10-03 DIAGNOSIS — Z7984 Long term (current) use of oral hypoglycemic drugs: Secondary | ICD-10-CM

## 2023-10-03 DIAGNOSIS — E7800 Pure hypercholesterolemia, unspecified: Secondary | ICD-10-CM

## 2023-10-03 MED ORDER — INSULIN LISPRO (1 UNIT DIAL) 100 UNIT/ML (KWIKPEN): 3.0000 [IU] | PEN_INJECTOR | Freq: Three times a day (TID) | SUBCUTANEOUS | 1 refills | Status: DC

## 2023-10-03 MED ORDER — DAPAGLIFLOZIN PROPANEDIOL 10 MG PO TABS: 10.0000 mg | ORAL_TABLET | Freq: Every day | ORAL | 1 refills | Status: DC

## 2023-10-03 MED ORDER — FREESTYLE LIBRE 3 PLUS SENSOR MISC: 3 refills | Status: DC

## 2023-10-03 MED ORDER — METFORMIN HCL ER 500 MG PO TB24: 1000.0000 mg | ORAL_TABLET | Freq: Two times a day (BID) | ORAL | 4 refills | Status: DC

## 2023-10-03 NOTE — Patient Instructions (Signed)

## 2023-10-03 NOTE — Progress Notes (Addendum)
The patient reports they are currently: Elizabeth [name]. This visit was conducted via video with the patient on the date of service. I spent an additional 5-10 minutes on pre- and post-visit activities on the date of service.   The patient was physically located in [place] or a state in which I am permitted to provide care. The patient and/or parent/guardian understood that s/he may incur co-pays and cost sharing, and agreed to the telemedicine visit. The visit was reasonable and appropriate under the circumstances given the patient's presentation at the time.  The patient and/or parent/guardian has been advised of the potential risks and limitations of this mode of treatment (including, but not limited to, the absence of in-person examination) and has agreed to be treated using telemedicine. The patient's/patient's family's questions regarding telemedicine have been answered.   The patient and/or parent/guardian has also been advised to contact their provider's office for worsening conditions, and seek emergency medical treatment and/or call 911 if the patient deems either necessary.    Outpatient Endocrinology Note Altamese Venedy, MD  10/03/23   Elizabeth [name] 06-Feb-1976 [id number]  Referring Provider: Sandford Craze, NP Primary Care Provider: Sandford Craze, NP Reason for consultation: Subjective   Assessment & Plan  Diagnoses and all orders for this visit:  Uncontrolled type 2 diabetes mellitus with hyperglycemia (HCC)  Long term (current) use of oral hypoglycemic drugs  Pure hypercholesterolemia  Other orders -     dapagliflozin propanediol (FARXIGA) 10 MG TABS tablet; Take 1 tablet (10 mg total) by mouth daily before breakfast. -     metFORMIN (GLUCOPHAGE-XR) 500 MG 24 hr tablet; Take 2 tablets (1,000 mg total) by mouth 2 (two) times daily with a meal. -     insulin lispro (HUMALOG KWIKPEN) 100 UNIT/ML KwikPen; Inject 3-7 Units into the skin 3 (three) times daily. 3  units if blood sugar is more than 200, 5 units if blood sugar is more than 250, 7 units if blood sugar is more than 300 -     Continuous Glucose Sensor (FREESTYLE LIBRE 3 PLUS SENSOR) MISC; Change sensor every 15 days.    Diabetes Type II complicated by hyperglycemia  History of RYGB Lab Results  Component Value Date   GFR 91.87 08/25/2023   Hba1c goal less than 7, current Hba1c is  Lab Results  Component Value Date   HGBA1C 9.0 (H) 09/26/2023   Will recommend the following: Farxiga 10 mg every day Metformin XR 500 mg 2 pills bid Januvia 100 mg every day PRN Humalog: 3 units if blood sugar is more than 200, 5 units if blood sugar is more than 250, 7 units if blood sugar is more than 300 Ordered Jacklynn Lewis is not covered, DexCom is not covered  Trulicity was stopped due to pt needing feeding tube to nausea/vomiting after RYGB in 09/2022  No known contraindications/side effects to any of above medications  -Last LD and Tg are as follows: Lab Results  Component Value Date   LDLCALC 94 09/26/2023    Lab Results  Component Value Date   TRIG 133 09/26/2023   -crestor 5 mg every day -Repeat labs and CMP to watch for AST/ALT -Follow low fat diet and exercise   -Blood pressure goal <140/90 - Microalbumin/creatinine goal is < 30 -Last MA/Cr is as follows: Lab Results  Component Value Date   MICROALBUR 5.7 (H) 05/28/2023   -on ACE/ARB lisinopril 5 mg every day  -diet changes including salt restriction -limit eating outside -counseled BP targets  per standards of diabetes care -uncontrolled blood pressure can lead to retinopathy, nephropathy and cardiovascular and atherosclerotic heart disease  Reviewed and counseled on: -A1C target -Blood sugar targets -Complications of uncontrolled diabetes  -Checking blood sugar before meals and bedtime and bring log next visit -All medications with mechanism of action and side effects -Hypoglycemia management: rule of 15's,  Glucagon Emergency Kit and medical alert ID -low-carb low-fat plate-method diet -At least 20 minutes of physical activity per day -Annual dilated retinal eye exam and foot exam -compliance and follow up needs -follow up as scheduled or earlier if problem gets worse  Call if blood sugar is less than 70 or consistently above 250    Take a 15 gm snack of carbohydrate at bedtime before you go to sleep if your blood sugar is less than 100.    If you are going to fast after midnight for a test or procedure, ask your physician for instructions on how to reduce/decrease your insulin dose.    Call if blood sugar is less than 70 or consistently above 250  -Treating a low sugar by rule of 15  (15 gms of sugar every 15 min until sugar is more than 70) If you feel your sugar is low, test your sugar to be sure If your sugar is low (less than 70), then take 15 grams of a fast acting Carbohydrate (3-4 glucose tablets or glucose gel or 4 ounces of juice or regular soda) Recheck your sugar 15 min after treating low to make sure it is more than 70 If sugar is still less than 70, treat again with 15 grams of carbohydrate          Don't drive the hour of hypoglycemia  If unconscious/unable to eat or drink by mouth, use glucagon injection or nasal spray baqsimi and call 911. Can repeat again in 15 min if still unconscious.  Return in about 13 days (around 10/16/2023).   I have reviewed current medications, nurse's notes, allergies, vital signs, past medical and surgical history, family medical history, and social history for this encounter. Counseled patient on symptoms, examination findings, lab findings, imaging results, treatment decisions and monitoring and prognosis. The patient understood the recommendations and agrees with the treatment plan. All questions regarding treatment plan were fully answered.  Altamese Guayanilla, MD  10/03/23    History of Present Illness Elizabeth [name] is a 47 y.o. year old  female who presents for evaluation of Type II diabetes mellitus.  Shatyra Search was first diagnosed in 2009.   Diabetes education +  Home diabetes regimen: Farxiga 5 mg every day Metformin XR 500 mg bid Xigduo 2.02/999 mg every day, pt wants tor resume again, was discontinued after RYGB, xigduo not covered now  Januvia 100 mg every day  Trulicity was stopped due to pt needing feeding tube to nausea/vomiting after RYGB in 09/2022 Did well with xigduo bid in past  COMPLICATIONS -  MI/Stroke -  retinopathy -  neuropathy -  nephropathy  SYMPTOMS REVIEWED - Polyuria + Weight loss, has RYGB 09/30/22; pre-surgery 316 lb - Blurred vision  BLOOD SUGAR DATA Checks twice a day BG were running 300s at one point  Physical Exam  There were no vitals taken for this visit.   Constitutional: well developed, well nourished Head: normocephalic, atraumatic Eyes: sclera anicteric, no redness Neck: supple Lungs: normal respiratory effort Neurology: alert and oriented Skin: dry, no appreciable rashes Musculoskeletal: no appreciable defects Psychiatric: normal mood and affect Diabetic Foot Exam -  Simple   No data filed      Current Medications Patient's Medications  New Prescriptions   CONTINUOUS GLUCOSE SENSOR (FREESTYLE LIBRE 3 PLUS SENSOR) MISC    Change sensor every 15 days.   DAPAGLIFLOZIN PROPANEDIOL (FARXIGA) 10 MG TABS TABLET    Take 1 tablet (10 mg total) by mouth daily before breakfast.   INSULIN LISPRO (HUMALOG KWIKPEN) 100 UNIT/ML KWIKPEN    Inject 3-7 Units into the skin 3 (three) times daily. 3 units if blood sugar is more than 200, 5 units if blood sugar is more than 250, 7 units if blood sugar is more than 300  Previous Medications   BLOOD GLUCOSE MONITORING SUPPL DEVI    1 each by Does not apply route in the morning, at noon, and at bedtime. May substitute to any manufacturer covered by patient's insurance.   CALCIUM-VITAMIN D PO    Take 2 tablets by mouth every  evening.   ESCITALOPRAM (LEXAPRO) 20 MG TABLET    Take 1 tablet (20 mg total) by mouth daily.   ESOMEPRAZOLE (NEXIUM) 40 MG CAPSULE    Take 1 capsule (40 mg total) by mouth 2 (two) times daily. Open capsule and sprinkle on food at breakfast and dinner for better absorption.   FAMOTIDINE (PEPCID) 20 MG TABLET    Take 1 tablet (20 mg total) by mouth 2 (two) times daily.   GABAPENTIN (NEURONTIN) 300 MG CAPSULE    Take 1 capsule (300 mg total) by mouth 3 (three) times daily.   GLUCOSE BLOOD (BLOOD GLUCOSE TEST STRIPS) STRP    1 each by In Vitro route in the morning, at noon, and at bedtime. May substitute to any manufacturer covered by patient's insurance.   HYDROCODONE-ACETAMINOPHEN (NORCO) 10-325 MG TABLET    Take by mouth.   HYDROXYZINE (ATARAX) 25 MG TABLET    Take 2 tablets by mouth at bedtime as needed for insomnia.   JANUVIA 100 MG TABLET    TAKE 1 TABLET(100 MG) BY MOUTH DAILY   LISINOPRIL (ZESTRIL) 5 MG TABLET    Take 1 tablet (5 mg total) by mouth daily.   MULTIPLE VITAMIN (MULTIVITAMIN WITH MINERALS) TABS TABLET    Take 2 tablets by mouth every evening.   ONDANSETRON (ZOFRAN-ODT) 4 MG DISINTEGRATING TABLET    DISSOLVE 1 TABLET(4 MG) ON THE TONGUE EVERY 8 HOURS AS NEEDED FOR NAUSEA OR VOMITING   ROSUVASTATIN (CRESTOR) 5 MG TABLET    TAKE 1 TABLET(5 MG) BY MOUTH DAILY   SITAGLIPTIN (JANUVIA) 100 MG TABLET    Take 1 tablet (100 mg total) by mouth daily.   SUCRALFATE (CARAFATE) 1 GM/10ML SUSPENSION    Take 10 mLs (1 g total) by mouth 4 (four) times daily.  Modified Medications   Modified Medication Previous Medication   METFORMIN (GLUCOPHAGE-XR) 500 MG 24 HR TABLET metFORMIN (GLUCOPHAGE-XR) 500 MG 24 hr tablet      Take 2 tablets (1,000 mg total) by mouth 2 (two) times daily with a meal.    Take 1 tablet (500 mg total) by mouth 2 (two) times daily with a meal.  Discontinued Medications   DAPAGLIFLOZIN PROPANEDIOL (FARXIGA) 5 MG TABS TABLET    Take 1 tablet (5 mg total) by mouth daily before  breakfast.    Allergies Allergies  Allergen Reactions   Tomato Hives, Itching and Rash    Past Medical History Past Medical History:  Diagnosis Date   Anemia    Anxiety    Astigmatism    Depression  Diabetes mellitus without complication (HCC)    Dry mouth    Fatigue    GERD (gastroesophageal reflux disease)    Heartburn    Hepatic steatosis    Hepatomegaly    High cholesterol    Hypertension    Keratoconjunctivitis    Liver disease    Muscle pain    Nausea and vomiting 04/21/2023   Ovarian cyst    Positive ANA (antinuclear antibody)    Pulmonary nodules    Vitamin D deficiency    Weakness     Past Surgical History Past Surgical History:  Procedure Laterality Date   BIOPSY  01/18/2021   Procedure: BIOPSY;  Surgeon: Shellia Cleverly, DO;  Location: [facility];  Service: Gastroenterology;;   CHOLECYSTECTOMY N/A 05/11/2021   Procedure: LAPAROSCOPIC CHOLECYSTECTOMY WITH INTRAOPERATIVE CHOLANGIOGRAM;  Surgeon: Quentin Ore, MD;  Location: [facility];  Service: General;  Laterality: N/A;   DILATION AND CURETTAGE OF UTERUS     ENDOMETRIAL BIOPSY  12/19/2017   normal per pt   ESOPHAGOGASTRODUODENOSCOPY (EGD) WITH PROPOFOL N/A 01/18/2021   Procedure: ESOPHAGOGASTRODUODENOSCOPY (EGD) WITH PROPOFOL;  Surgeon: Shellia Cleverly, DO;  Location: [facility];  Service: Gastroenterology;  Laterality: N/A;   HIATAL HERNIA REPAIR N/A 09/30/2022   Procedure: HERNIA REPAIR HIATAL;  Surgeon: Quentin Ore, MD;  Location: [facility];  Service: General;  Laterality: N/A;   LAPAROSCOPIC INSERTION GASTROSTOMY TUBE N/A 12/02/2022   Procedure: LAPAROSCOPIC REMNANT GASTROSTOMY G TUBE;  Surgeon: Quentin Ore, MD;  Location: [facility];  Service: General;  Laterality: N/A;   LAPAROSCOPIC ROUX-EN-Y GASTRIC BYPASS WITH HIATAL HERNIA REPAIR  09/30/2022   LAPAROSCOPY N/A 12/02/2022   Procedure: LAPAROSCOPY DIAGNOSTIC;  Surgeon: Quentin Ore, MD;  Location: [facility];  Service:  General;  Laterality: N/A;   TONSILLECTOMY     TUBAL LIGATION     UPPER GI ENDOSCOPY N/A 09/30/2022   Procedure: UPPER GI ENDOSCOPY;  Surgeon: Quentin Ore, MD;  Location: [facility];  Service: General;  Laterality: N/A;   UPPER GI ENDOSCOPY N/A 12/02/2022   Procedure: UPPER ENDOSCOPY;  Surgeon: Quentin Ore, MD;  Location: [facility];  Service: General;  Laterality: N/A;    Family History family history includes Alcohol abuse in her father; Diabetes in her mother; Drug abuse in her father; HIV in her father; Hyperlipidemia in her mother; Hypertension in her mother; Lupus in her maternal aunt; Obesity in her mother; Ovarian cancer in her maternal grandmother; Peripheral Artery Disease in her mother.  Social History Social History   Socioeconomic History   Marital status: Widowed    Spouse name: Mellody Dance   Number of children: 2   Years of education: Not on file   Highest education level: Associate degree: occupational, Scientist, product/process development, or vocational program  Occupational History   Occupation: CMA - Geophysicist/field seismologist: [place]  Tobacco Use   Smoking status: Former    Current packs/day: 0.00    Average packs/day: 0.5 packs/day for 3.0 years (1.5 ttl pk-yrs)    Types: Cigarettes    Start date: 08/21/2018    Quit date: 08/21/2021    Years since quitting: 2.1    Passive exposure: Never   Smokeless tobacco: Never   Tobacco comments:    4-5/day  Vaping Use   Vaping status: Never Used  Substance and Sexual Activity   Alcohol use: No   Drug use: No   Sexual activity: Yes    Partners: Male    Birth control/protection:  Surgical, Implant  Other Topics Concern   Not on file  Social History Narrative    2 children   63- son Collier Bullock   19- son Designer, jewellery in [place]- CMA in [place]   Enjoys reading   Widowed 24-Jul-2023, husband died from COVID-19.   Social Drivers of Health   Financial Resource Strain: Medium Risk (08/18/2023)   Overall Financial  Resource Strain (CARDIA)    Difficulty of Paying Living Expenses: Somewhat hard  Food Insecurity: No Food Insecurity (08/18/2023)   Hunger Vital Sign    Worried About Running Out of Food in the Last Year: Never true    Ran Out of Food in the Last Year: Never true  Transportation Needs: No Transportation Needs (08/18/2023)   PRAPARE - Administrator, Civil Service (Medical): No    Lack of Transportation (Non-Medical): No  Physical Activity: Inactive (08/18/2023)   Exercise Vital Sign    Days of Exercise per Week: 0 days    Minutes of Exercise per Session: 10 min  Stress: Stress Concern Present (08/18/2023)   [employer] of Occupational Health - Occupational Stress Questionnaire    Feeling of Stress : To some extent  Social Connections: Moderately Isolated (08/18/2023)   Social Connection and Isolation Panel [NHANES]    Frequency of Communication with Friends and Family: Twice a week    Frequency of Social Gatherings with Friends and Family: Never    Attends Religious Services: More than 4 times per year    Active Member of [employer] or Organizations: No    Attends Engineer, structural: More than 4 times per year    Marital Status: Widowed  Intimate Partner Violence: Not At Risk (12/02/2022)   Humiliation, Afraid, Rape, and Kick questionnaire    Fear of Current or Ex-Partner: No    Emotionally Abused: No    Physically Abused: No    Sexually Abused: No    Lab Results  Component Value Date   HGBA1C 9.0 (H) 09/26/2023   HGBA1C 8.7 (H) 05/28/2023   HGBA1C 9.1 (H) 03/07/2023   Lab Results  Component Value Date   CHOL 178 09/26/2023   Lab Results  Component Value Date   HDL 61 09/26/2023   Lab Results  Component Value Date   LDLCALC 94 09/26/2023   Lab Results  Component Value Date   TRIG 133 09/26/2023   Lab Results  Component Value Date   CHOLHDL 2.9 09/26/2023   Lab Results  Component Value Date   CREATININE 0.67 09/26/2023   Lab Results   Component Value Date   GFR 91.87 08/25/2023   Lab Results  Component Value Date   MICROALBUR 5.7 (H) 05/28/2023      Component Value Date/Time   NA 139 09/26/2023 0809   NA 137 02/27/2022 0000   K 4.4 09/26/2023 0809   CL 104 09/26/2023 0809   CO2 24 09/26/2023 0809   GLUCOSE 173 (H) 09/26/2023 0809   BUN 11 09/26/2023 0809   BUN 7 12/22/2018 1430   CREATININE 0.67 09/26/2023 0809   CALCIUM 9.4 09/26/2023 0809   PROT 7.1 09/26/2023 0809   PROT 6.8 12/22/2018 1430   ALBUMIN 3.9 08/25/2023 1039   ALBUMIN 4.3 12/22/2018 1430   AST 22 09/26/2023 0809   ALT 25 09/26/2023 0809   ALKPHOS 94 08/25/2023 1039   BILITOT 0.2 09/26/2023 0809   BILITOT 0.3 12/22/2018 1430   GFRNONAA >60 07/14/2023 1507   GFRAA 133 12/22/2018 1430  Latest Ref Rng & Units 09/26/2023    8:09 AM 08/25/2023   10:39 AM 07/14/2023    3:07 PM  BMP  Glucose 65 - 99 mg/dL 595  638  756   BUN 7 - 25 mg/dL 11  10  8    Creatinine 0.50 - 0.99 mg/dL 4.33  2.95  1.88   BUN/Creat Ratio 6 - 22 (calc) SEE NOTE:     Sodium 135 - 146 mmol/L 139  138  137   Potassium 3.5 - 5.3 mmol/L 4.4  4.4  4.3   Chloride 98 - 110 mmol/L 104  103  101   CO2 20 - 32 mmol/L 24  26  22    Calcium 8.6 - 10.2 mg/dL 9.4  9.2  9.2        Component Value Date/Time   WBC 5.9 07/14/2023 1507   RBC 5.43 (H) 07/14/2023 1507   HGB 12.7 07/14/2023 1507   HGB 13.3 12/22/2018 1430   HCT 41.0 07/14/2023 1507   HCT 43.4 12/22/2018 1430   PLT 262 07/14/2023 1507   PLT 301 12/22/2018 1430   MCV 75.5 (L) 07/14/2023 1507   MCV 73 (L) 12/22/2018 1430   MCH 23.4 (L) 07/14/2023 1507   MCHC 31.0 07/14/2023 1507   RDW 14.9 07/14/2023 1507   RDW 15.9 (H) 12/22/2018 1430   LYMPHSABS 2.1 11/24/2022 0305   LYMPHSABS 2.1 12/22/2018 1430   MONOABS 0.4 11/24/2022 0305   EOSABS 0.1 11/24/2022 0305   EOSABS 0.1 12/22/2018 1430   BASOSABS 0.0 11/24/2022 0305   BASOSABS 0.1 12/22/2018 1430     Parts of this note may have been dictated using  voice recognition software. There may be variances in spelling and vocabulary which are unintentional. Not all errors are proofread. Please notify the Thereasa Parkin if any discrepancies are noted or if the meaning of any statement is not clear.

## 2023-10-06 ENCOUNTER — Other Ambulatory Visit: Payer: Self-pay | Admitting: "Endocrinology

## 2023-10-06 MED ORDER — TRULICITY 0.75 MG/0.5ML ~~LOC~~ SOAJ: 0.7500 mg | SUBCUTANEOUS | 0 refills | Status: DC

## 2023-10-09 ENCOUNTER — Encounter: Payer: Self-pay | Admitting: "Endocrinology

## 2023-10-09 ENCOUNTER — Telehealth: Payer: Self-pay

## 2023-10-09 ENCOUNTER — Other Ambulatory Visit (HOSPITAL_COMMUNITY): Payer: Self-pay

## 2023-10-09 NOTE — Telephone Encounter (Signed)
Pharmacy Patient Advocate Encounter   Received notification from CoverMyMeds that prior authorization for Elizabeth [name] is required/requested.   Insurance verification completed.   The patient is insured through [facility] .   Per test claim: PA required; PA submitted to above mentioned insurance via CoverMyMeds Key/confirmation #/EOC [employer] Status is pending

## 2023-10-09 NOTE — Telephone Encounter (Signed)
Per patient, pharmacy needs a PA for CGM and trulicity.

## 2023-10-10 ENCOUNTER — Other Ambulatory Visit (HOSPITAL_COMMUNITY): Payer: Self-pay

## 2023-10-10 ENCOUNTER — Telehealth: Payer: Self-pay

## 2023-10-10 ENCOUNTER — Other Ambulatory Visit: Payer: Self-pay

## 2023-10-10 NOTE — Telephone Encounter (Signed)
Pharmacy Patient Advocate Encounter   Received notification from Pt Calls Messages that prior authorization for Trulicity is required/requested.   Insurance verification completed.   The patient is insured through [facility] .   Per test claim: PA required; PA submitted to above mentioned insurance via CoverMyMeds Key/confirmation #/EOC [facility] Status is pending

## 2023-10-10 NOTE — Telephone Encounter (Signed)
Pharmacy Patient Advocate Encounter  Received notification from [facility] that Prior Authorization for [name] has been APPROVED through 10/08/2024   PA #/Case ID/Reference #: [id number]

## 2023-10-10 NOTE — Telephone Encounter (Signed)
Pharmacy Patient Advocate Encounter   Received notification from Pt Calls Messages that prior authorization for Freestyle libre 3 plus is required/requested.   Insurance verification completed.   The patient is insured through [facility] .   Per test claim: PA required; PA submitted to above mentioned insurance via CoverMyMeds Key/confirmation #/EOC [id number] Status is pending

## 2023-10-10 NOTE — Telephone Encounter (Signed)
Pharmacy Patient Advocate Encounter  Received notification from [facility] that Prior Authorization for [facility] 3 plus has been DENIED.  Full denial letter will be uploaded to the media tab. See denial reason below.   PA #/Case ID/Reference #: [id number]

## 2023-10-16 ENCOUNTER — Ambulatory Visit: Payer: Medicaid Other | Admitting: "Endocrinology

## 2023-10-16 ENCOUNTER — Other Ambulatory Visit: Payer: Self-pay

## 2023-10-20 ENCOUNTER — Other Ambulatory Visit: Payer: Self-pay

## 2023-10-20 ENCOUNTER — Encounter (HOSPITAL_COMMUNITY): Payer: Self-pay | Admitting: Surgery

## 2023-10-20 NOTE — Progress Notes (Addendum)
COVID Vaccine Completed: yes  Date of COVID positive in last 90 days:  PCP - Sandford Craze, MD Cardiologist - Gypsy Balsam, MD LOV 04/30/19 for CP Endocrinologist- Altamese Dunkerton, MD LOV 10/03/23  CT- 04/01/23 Epic Chest x-ray - n/a EKG - n/a Stress Test - 05/04/19 Epic ECHO - n/a Cardiac Cath - n/a Pacemaker/ICD device last checked: n/a Spinal Cord Stimulator: n/a  Bowel Prep - NPO after midnight  Sleep Study - n/a CPAP -   Fasting Blood Sugar - 250 Checks Blood Sugar 2 times a day  Last dose of GLP1 agonist-  Trulicity, has not started. Will start up after procedure GLP1 instructions:  Hold 7 days before surgery    Last dose of SGLT-2 inhibitors-  Farxiga SGLT-2 instructions:  Hold 3 days before surgery, last dose 10/23/22.  Hold Metformin DOS  Take Humalog as prescribed day before. DO not take Humalog DOS unless blood sugar is greater than 220, then  take 50% of dose   Blood Thinner Instructions: n/a Aspirin Instructions: Last Dose:  Activity level: Can go up a flight of stairs and perform activities of daily living without stopping and without symptoms of chest pain or shortness of breath.   Anesthesia review: A1C 9.0 09/26/23, DM2, HTN, aortic atherosclerosis   Patient denies shortness of breath, fever, cough and chest pain at PAT appointment  Patient verbalized understanding of instructions that were given to them at the PAT appointment. Patient was also instructed that they will need to review over the PAT instructions again at home before surgery.

## 2023-10-21 ENCOUNTER — Encounter: Payer: Medicaid Other | Attending: Surgery | Admitting: Skilled Nursing Facility1

## 2023-10-21 ENCOUNTER — Encounter: Payer: Self-pay | Admitting: Skilled Nursing Facility1

## 2023-10-21 DIAGNOSIS — Z6841 Body Mass Index (BMI) 40.0 and over, adult: Secondary | ICD-10-CM | POA: Diagnosis present

## 2023-10-21 DIAGNOSIS — E1165 Type 2 diabetes mellitus with hyperglycemia: Secondary | ICD-10-CM | POA: Insufficient documentation

## 2023-10-21 DIAGNOSIS — K219 Gastro-esophageal reflux disease without esophagitis: Secondary | ICD-10-CM | POA: Diagnosis present

## 2023-10-21 NOTE — Progress Notes (Signed)
 Bariatric Nutrition Follow-Up Visit Medical Nutrition Therapy   Surgery date: 09/30/2022 Surgery type: RYGB  Anthropometrics  Start weight at [facility]: 308.0 lbs (date: 04/29/2022)  Height: 62 in Weight today: virtual appt: pt identified by name and DOB  Clinical  Medical hx: Obesity, hyperlipidemia, HTN, hiatal hernia, T2DM, depression, arthritis, GERD  Medications:  lisinopril , Trulicity : taken off, escitalopram , ondansetron , hydroxyzine , lovastatin , glipizide  Labs: A1C 9.4; HDL 56; RBC 5.25, MCV 75.6 Notable signs/symptoms: none noted Any previous deficiencies? No Bowel Habits: Every day to every other day no complaints  Body Composition Scale 12/12/2022 01/01/2023 01/27/2023  Weight  lbs 276.2 271.5 270.2  Total Body Fat  % 48.6 48.2 48.1     Visceral Fat 20 19 19   Fat-Free Mass  % 51.3 51.7 51.8     Total Body Water   % 40.1 40.3 40.4     Muscle-Mass  lbs 30.6 30.6 30.6  BMI 50.4 49.5 49.3  Body Fat Displacement ---          Torso  lbs 83.3 81.2 80.7        Left Leg  lbs 16.6 16.2 16.1        Right Leg  lbs 6.6 16.2 16.1        Left Arm  lbs 8.3 8.1 8.0        Right Arm  lbs 8.3 8.1 8.0    Lifestyle & Dietary Hx  Pt is now off of TF  Pt states she is still in considerable pain in her stomach with 3-4 occurences of acid up from her stomach and some regurgitation not often. There has been a discovery of ulcers in her Roux limb that is being treats with a regiment that she reports doing daily.  Pt states her mother and brother are very supportive and make her meals and new recipes often.   Pt currently reports eating well even with the pain she is experiencing stating it is important to her she does not become malnourished but is also vexed she is not the weight she would like to be.   Foods tolerated well: Potato Sweet potato Romaine lettuce Squash Zucchini Beans Black eyed peas Lean steak Broccoli Bell peppers   Estimated daily fluid intake: Estimated daily  protein intake: 80 g Supplements: multi every evening and calcium  Current average weekly physical activity: ADL's   Post-Op Goals/ Signs/ Symptoms Using straws: no Drinking while eating: no Chewing/swallowing difficulties: no Changes in vision: no Changes to mood/headaches: no Hair loss/changes to skin/nails: yes Difficulty focusing/concentrating: no Sweating: no Limb weakness: no Dizziness/lightheadedness: no Palpitations: no  Carbonated/caffeinated beverages: no N/V/D/C/Gas: bowel movement every other day Abdominal pain: no Dumping syndrome: no    NUTRITION DIAGNOSIS  Overweight/obesity ([place]-3.3) related to past poor dietary habits and physical inactivity as evidenced by completed bariatric surgery and following dietary guidelines for continued weight loss and healthy nutrition status.     NUTRITION INTERVENTION Nutrition counseling (C-1) and education (E-2) to facilitate bariatric surgery goals, including: -GERD -Ulcer healing -mitigate further risk of malnutrition   Goals: I will: Stop drinking [place]  tea Avoid the use of garlic and onion for 7 days taking note of signs/symptoms  Drink 1 protein supplement per day trying a plant based option  Drink alkaline water  with a pH 8.8

## 2023-10-21 NOTE — Telephone Encounter (Signed)
Pharmacy Patient Advocate Encounter  Received notification from [facility] that Prior Authorization for Trulicity has been APPROVED through 10/09/2024

## 2023-10-28 ENCOUNTER — Ambulatory Visit (HOSPITAL_BASED_OUTPATIENT_CLINIC_OR_DEPARTMENT_OTHER): Payer: Medicaid Other | Admitting: Anesthesiology

## 2023-10-28 ENCOUNTER — Other Ambulatory Visit: Payer: Self-pay

## 2023-10-28 ENCOUNTER — Encounter (HOSPITAL_COMMUNITY)
Admission: RE | Disposition: A | Discharge status: Home / Self Care | Payer: Self-pay | Source: Home / Self Care | Attending: Surgery

## 2023-10-28 ENCOUNTER — Ambulatory Visit (HOSPITAL_COMMUNITY)
Admission: RE | Admit: 2023-10-28 | Discharge: 2023-10-28 | Disposition: A | Discharge status: Home / Self Care | Payer: Medicaid Other | Attending: Surgery | Admitting: Surgery

## 2023-10-28 ENCOUNTER — Ambulatory Visit (HOSPITAL_COMMUNITY): Payer: Medicaid Other | Admitting: Anesthesiology

## 2023-10-28 DIAGNOSIS — I10 Essential (primary) hypertension: Secondary | ICD-10-CM

## 2023-10-28 DIAGNOSIS — Z87891 Personal history of nicotine dependence: Secondary | ICD-10-CM | POA: Diagnosis not present

## 2023-10-28 DIAGNOSIS — Z09 Encounter for follow-up examination after completed treatment for conditions other than malignant neoplasm: Secondary | ICD-10-CM | POA: Insufficient documentation

## 2023-10-28 DIAGNOSIS — K529 Noninfective gastroenteritis and colitis, unspecified: Secondary | ICD-10-CM

## 2023-10-28 DIAGNOSIS — E119 Type 2 diabetes mellitus without complications: Secondary | ICD-10-CM | POA: Diagnosis not present

## 2023-10-28 DIAGNOSIS — E66813 Obesity, class 3: Secondary | ICD-10-CM | POA: Diagnosis not present

## 2023-10-28 DIAGNOSIS — F419 Anxiety disorder, unspecified: Secondary | ICD-10-CM | POA: Insufficient documentation

## 2023-10-28 DIAGNOSIS — F32A Depression, unspecified: Secondary | ICD-10-CM | POA: Diagnosis not present

## 2023-10-28 DIAGNOSIS — K219 Gastro-esophageal reflux disease without esophagitis: Secondary | ICD-10-CM | POA: Diagnosis not present

## 2023-10-28 DIAGNOSIS — Z794 Long term (current) use of insulin: Secondary | ICD-10-CM | POA: Insufficient documentation

## 2023-10-28 DIAGNOSIS — Z980 Intestinal bypass and anastomosis status: Secondary | ICD-10-CM | POA: Diagnosis not present

## 2023-10-28 DIAGNOSIS — Z8711 Personal history of peptic ulcer disease: Secondary | ICD-10-CM | POA: Insufficient documentation

## 2023-10-28 DIAGNOSIS — Z9884 Bariatric surgery status: Secondary | ICD-10-CM | POA: Diagnosis not present

## 2023-10-28 DIAGNOSIS — E785 Hyperlipidemia, unspecified: Secondary | ICD-10-CM | POA: Diagnosis not present

## 2023-10-28 DIAGNOSIS — Z6841 Body Mass Index (BMI) 40.0 and over, adult: Secondary | ICD-10-CM | POA: Diagnosis not present

## 2023-10-28 DIAGNOSIS — Z7984 Long term (current) use of oral hypoglycemic drugs: Secondary | ICD-10-CM | POA: Insufficient documentation

## 2023-10-28 HISTORY — PX: BIOPSY: SHX5522

## 2023-10-28 HISTORY — PX: ESOPHAGOGASTRODUODENOSCOPY: SHX5428

## 2023-10-28 LAB — GLUCOSE, CAPILLARY: Glucose-Capillary: 172 mg/dL — ABNORMAL HIGH (ref 70–99)

## 2023-10-28 SURGERY — EGD (ESOPHAGOGASTRODUODENOSCOPY)
Anesthesia: Monitor Anesthesia Care

## 2023-10-28 MED ORDER — PROPOFOL 500 MG/50ML IV EMUL
INTRAVENOUS | Status: DC | PRN
  Administered 2023-10-28: 40 mg via INTRAVENOUS
  Administered 2023-10-28: 70 mg via INTRAVENOUS
  Administered 2023-10-28: 80 ug/kg/min via INTRAVENOUS

## 2023-10-28 MED ORDER — ONDANSETRON HCL 4 MG/2ML IJ SOLN
INTRAMUSCULAR | Status: AC
Start: 2023-10-28 — End: ?
  Filled 2023-10-28: qty 2

## 2023-10-28 MED ORDER — GLYCOPYRROLATE 0.2 MG/ML IJ SOLN
INTRAMUSCULAR | Status: DC | PRN
  Administered 2023-10-28 (×2): .1 mg via INTRAVENOUS

## 2023-10-28 MED ORDER — LIDOCAINE HCL (CARDIAC) PF 50 MG/5ML IV SOSY
PREFILLED_SYRINGE | INTRAVENOUS | Status: DC | PRN
  Administered 2023-10-28: 60 mg via INTRAVENOUS

## 2023-10-28 MED ORDER — DEXMEDETOMIDINE HCL IN NACL 80 MCG/20ML IV SOLN
INTRAVENOUS | Status: DC | PRN
  Administered 2023-10-28 (×3): 4 ug via INTRAVENOUS

## 2023-10-28 MED ORDER — ONDANSETRON HCL 4 MG/2ML IJ SOLN
4.0000 mg | Freq: Once | INTRAMUSCULAR | Status: AC | PRN
  Administered 2023-10-28: 4 mg via INTRAVENOUS

## 2023-10-28 MED ORDER — KETAMINE HCL 10 MG/ML IJ SOLN
INTRAMUSCULAR | Status: DC | PRN
  Administered 2023-10-28: 10 mg via INTRAVENOUS

## 2023-10-28 MED ORDER — SODIUM CHLORIDE 0.9 % IV SOLN: INTRAVENOUS | Status: DC | PRN

## 2023-10-28 NOTE — Transfer of Care (Addendum)
 Immediate Anesthesia Transfer of Care Note  Patient: Elizabeth [name]  Procedure(s) Performed: UPPER ENDOSCOPY BIOPSY  Patient Location: PACU and Endoscopy Unit  Anesthesia Type:General  Level of Consciousness: awake, alert , and oriented  Airway & Oxygen Therapy: Patient Spontanous Breathing  Post-op Assessment: Report given to RN and Post -op Vital signs reviewed and stable  Post vital signs: Reviewed and stable  Last Vitals:  Vitals Value Taken Time  BP    Temp  /  Pulse    Resp    SpO2      Last Pain:  Vitals:   10/28/23 1227  TempSrc: Tympanic  PainSc: 4          Complications BP 126/63  HR 86  RR 16  Sat 96%

## 2023-10-28 NOTE — H&P (Signed)
 Admitting Physician: Deward PARAS Danae Oland  Service: Bariatric surgery  CC: marginal ulcers  Subjective   HPI: Elizabeth [name] is an 48 y.o. female who is here for follow up EGD after being treated for marginal ulcers on EGD 08/06/23, with Dr. Sherell.  Unfortunately, her pain symptoms continue.  Past Medical History:  Diagnosis Date   Anemia    Anxiety    Astigmatism    Depression    Diabetes mellitus without complication (HCC)    Dry mouth    Fatigue    GERD (gastroesophageal reflux disease)    Heartburn    Hepatic steatosis    Hepatomegaly    High cholesterol    Hypertension    Keratoconjunctivitis    Liver disease    Muscle pain    Nausea and vomiting 04/21/2023   Ovarian cyst    Positive ANA (antinuclear antibody)    Pulmonary nodules    Vitamin D  deficiency    Weakness     Past Surgical History:  Procedure Laterality Date   BIOPSY  01/18/2021   Procedure: BIOPSY;  Surgeon: San Sandor GAILS, DO;  Location: [facility];  Service: Gastroenterology;;   CHOLECYSTECTOMY N/A 05/11/2021   Procedure: LAPAROSCOPIC CHOLECYSTECTOMY WITH INTRAOPERATIVE CHOLANGIOGRAM;  Surgeon: Lyndel Deward PARAS, MD;  Location: [facility];  Service: General;  Laterality: N/A;   DILATION AND CURETTAGE OF UTERUS     ENDOMETRIAL BIOPSY  12/19/2017   normal per pt   ESOPHAGOGASTRODUODENOSCOPY (EGD) WITH PROPOFOL  N/A 01/18/2021   Procedure: ESOPHAGOGASTRODUODENOSCOPY (EGD) WITH PROPOFOL ;  Surgeon: San Sandor GAILS, DO;  Location: [facility];  Service: Gastroenterology;  Laterality: N/A;   HIATAL HERNIA REPAIR N/A 09/30/2022   Procedure: HERNIA REPAIR HIATAL;  Surgeon: Lyndel Deward PARAS, MD;  Location: [facility];  Service: General;  Laterality: N/A;   LAPAROSCOPIC INSERTION GASTROSTOMY TUBE N/A 12/02/2022   Procedure: LAPAROSCOPIC REMNANT GASTROSTOMY G TUBE;  Surgeon: Lyndel Deward PARAS, MD;  Location: [facility];  Service: General;  Laterality: N/A;   LAPAROSCOPIC ROUX-EN-Y GASTRIC BYPASS  WITH HIATAL HERNIA REPAIR  09/30/2022   LAPAROSCOPY N/A 12/02/2022   Procedure: LAPAROSCOPY DIAGNOSTIC;  Surgeon: Lyndel Deward PARAS, MD;  Location: [facility];  Service: General;  Laterality: N/A;   TONSILLECTOMY     TUBAL LIGATION     UPPER GI ENDOSCOPY N/A 09/30/2022   Procedure: UPPER GI ENDOSCOPY;  Surgeon: Lyndel Deward PARAS, MD;  Location: [facility];  Service: General;  Laterality: N/A;   UPPER GI ENDOSCOPY N/A 12/02/2022   Procedure: UPPER ENDOSCOPY;  Surgeon: Lyndel Deward PARAS, MD;  Location: [facility];  Service: General;  Laterality: N/A;    Family History  Problem Relation Age of Onset   Diabetes Mother    Hypertension Mother    Hyperlipidemia Mother    Obesity Mother    Peripheral Artery Disease Mother    HIV Father        died from complications (IVDU)   Alcohol abuse Father    Drug abuse Father    Lupus Maternal Aunt    Ovarian cancer Maternal Grandmother    Colon cancer Neg Hx    Esophageal cancer Neg Hx    Liver disease Neg Hx    Pancreatic cancer Neg Hx    Stomach cancer Neg Hx    Sleep apnea Neg Hx    Rectal cancer Neg Hx     Social:  reports that she quit smoking about 2 years ago. Her smoking use included cigarettes. She started smoking about 5 years ago.  She has a 1.5 pack-year smoking history. She has never been exposed to tobacco smoke. She has never used smokeless tobacco. She reports that she does not drink alcohol and does not use drugs.  Allergies:  Allergies  Allergen Reactions   Tomato Hives, Itching and Rash    Medications: Current Outpatient Medications  Medication Instructions   Blood Glucose Monitoring Suppl DEVI 1 each, Does not apply, 3 times daily, May substitute to any manufacturer covered by patient's insurance.   CALCIUM -VITAMIN D  PO 2 tablets, Every evening   Continuous Glucose Sensor (FREESTYLE LIBRE 3 PLUS SENSOR) MISC Change sensor every 15 days.   dapagliflozin  propanediol (FARXIGA ) 10 mg, Oral, Daily before breakfast    escitalopram  (LEXAPRO ) 20 mg, Oral, Daily   esomeprazole  (NEXIUM ) 40 mg, Oral, 2 times daily, Open capsule and sprinkle on food at breakfast and dinner for better absorption.   famotidine  (PEPCID ) 20 mg, Oral, 2 times daily   gabapentin  (NEURONTIN ) 300 mg, Oral, 3 times daily   Glucose Blood (BLOOD GLUCOSE TEST STRIPS) STRP 1 each, In Vitro, 3 times daily, May substitute to any manufacturer covered by patient's insurance.   HYDROcodone-acetaminophen  (NORCO) 10-325 MG tablet Take by mouth.   hydrOXYzine  (ATARAX ) 25 MG tablet Take 2 tablets by mouth at bedtime as needed for insomnia.   insulin  lispro (HUMALOG  KWIKPEN) 3-7 Units, Subcutaneous, 3 times daily, 3 units if blood sugar is more than 200, 5 units if blood sugar is more than 250, 7 units if blood sugar is more than 300   lisinopril  (ZESTRIL ) 5 mg, Oral, Daily   metFORMIN  (GLUCOPHAGE -XR) 1,000 mg, Oral, 2 times daily with meals   Multiple Vitamin (MULTIVITAMIN WITH MINERALS) TABS tablet 2 tablets, Every evening   ondansetron  (ZOFRAN -ODT) 4 MG disintegrating tablet DISSOLVE 1 TABLET(4 MG) ON THE TONGUE EVERY 8 HOURS AS NEEDED FOR NAUSEA OR VOMITING   rosuvastatin  (CRESTOR ) 5 MG tablet TAKE 1 TABLET(5 MG) BY MOUTH DAILY   sucralfate  (CARAFATE ) 1 g, Oral, 4 times daily   Trulicity  0.75 mg, Subcutaneous, Weekly    ROS - all of the below systems have been reviewed with the patient and positives are indicated with bold text General: chills, fever or night sweats Eyes: blurry vision or double vision ENT: epistaxis or sore throat Allergy/Immunology: itchy/watery eyes or nasal congestion Hematologic/Lymphatic: bleeding problems, blood clots or swollen lymph nodes Endocrine: temperature intolerance or unexpected weight changes Breast: new or changing breast lumps or nipple discharge Resp: cough, shortness of breath, or wheezing CV: chest pain or dyspnea on exertion GI: as per HPI GU: dysuria, trouble voiding, or hematuria MSK: joint pain or  joint stiffness Neuro: TIA or stroke symptoms Derm: pruritus and skin lesion changes Psych: anxiety and depression  Objective   PE Height 5' 3 (1.6 m), weight 122.5 kg. Constitutional: NAD; conversant; no deformities Eyes: Moist conjunctiva; no lid lag; anicteric; PERRL Neck: Trachea midline; no thyromegaly Lungs: Normal respiratory effort; no tactile fremitus CV: RRR; no palpable thrills; no pitting edema GI: Abd Soft, nontender; no palpable hepatosplenomegaly MSK: Normal range of motion of extremities; no clubbing/cyanosis Psychiatric: Appropriate affect; alert and oriented x3 Lymphatic: No palpable cervical or axillary lymphadenopathy  No results found for this or any previous visit (from the past 24 hours).  Imaging Orders  No imaging studies ordered today     Assessment and Plan   Elizabeth [name] is an 48 y.o. female with a history of gastric bypass who presents for EGD today to follow up on epigatric pain  and marginal ulcers that were found on EGD on 08/06/23, with Dr. Sherell at [facility].  We discussed the procedure, its risks, benefits and alternatives, and the patient granted consent to proceed.  Deward JINNY Foy, MD  [facility] Use [email or web address] to contact on call provider

## 2023-10-28 NOTE — Anesthesia Preprocedure Evaluation (Signed)
 Anesthesia Evaluation  Patient identified by MRN, date of birth, ID band Patient awake    Reviewed: Allergy & Precautions, NPO status , Patient's Chart, lab work & pertinent test results  Airway Mallampati: II  TM Distance: >3 FB Neck ROM: Full    Dental no notable dental hx.    Pulmonary former smoker   Pulmonary exam normal        Cardiovascular hypertension, Pt. on medications Normal cardiovascular exam     Neuro/Psych  PSYCHIATRIC DISORDERS Anxiety Depression    negative neurological ROS     GI/Hepatic Neg liver ROS,GERD  Medicated and Controlled,,  Endo/Other  diabetes, Oral Hypoglycemic Agents, Insulin  Dependent  Class 3 obesity  Renal/GU negative Renal ROS     Musculoskeletal negative musculoskeletal ROS (+)    Abdominal  (+) + obese  Peds  Hematology negative hematology ROS (+)   Anesthesia Other Findings MARGINAL ULCERS  Reproductive/Obstetrics S/p BTL                             Anesthesia Physical Anesthesia Plan  ASA: 3  Anesthesia Plan: MAC   Post-op Pain Management:    Induction:   PONV Risk Score and Plan: 2 and Propofol  infusion and Treatment may vary due to age or medical condition  Airway Management Planned: Nasal Cannula  Additional Equipment:   Intra-op Plan:   Post-operative Plan:   Informed Consent: I have reviewed the patients History and Physical, chart, labs and discussed the procedure including the risks, benefits and alternatives for the proposed anesthesia with the patient or authorized representative who has indicated his/her understanding and acceptance.       Plan Discussed with: CRNA  Anesthesia Plan Comments:        Anesthesia Quick Evaluation

## 2023-10-28 NOTE — Anesthesia Procedure Notes (Signed)
 Procedure Name: General with mask airway Date/Time: 10/28/2023 1:38 PM  Performed by: Harl Armida PARAS, CRNAPre-anesthesia Checklist: Patient identified, Emergency Drugs available, Suction available, Patient being monitored and Timeout performed Patient Re-evaluated:Patient Re-evaluated prior to induction Oxygen Delivery Method: Simple face mask Preoxygenation: Pre-oxygenation with 100% oxygen Induction Type: IV induction Placement Confirmation: positive ETCO2

## 2023-10-28 NOTE — Op Note (Signed)
 [facility] Patient Name: Elizabeth [name] Procedure Date: 10/28/2023 MRN: [id number] Attending MD: Deward JINNY Foy , ,  Date of Birth: 21-Aug-1976 CSN: [id number] Age: 48 Admit Type: Outpatient Procedure:                Upper GI endoscopy Indications:              Status post Roux-en-Y Providers:                Deward DOROTHA Foy, Particia Fischer, RN, Corene Southgate, Technician Referring MD:              Medicines:                Monitored Anesthesia Care Complications:            No immediate complications. Estimated Blood Loss:     Estimated blood loss was minimal. Procedure:                Pre-Anesthesia Assessment:                           - Prior to the procedure, a History and Physical                            was performed, and patient medications and                            allergies were reviewed. The patient is competent.                            The risks and benefits of the procedure and the                            sedation options and risks were discussed with the                            patient. All questions were answered and informed                            consent was obtained. Patient identification and                            proposed procedure were verified by the physician                            in the pre-procedure area. Mental Status                            Examination: alert and oriented. Airway                            Examination: normal oropharyngeal airway and neck                            mobility. Respiratory  Examination: clear to                            auscultation. CV Examination: normal. Prophylactic                            Antibiotics: The patient does not require                            prophylactic antibiotics. Prior Anticoagulants: The                            patient has taken no anticoagulant or antiplatelet                            agents. ASA Grade  Assessment: II - A patient with                            mild systemic disease. After reviewing the risks                            and benefits, the patient was deemed in                            satisfactory condition to undergo the procedure.                            The anesthesia plan was to use monitored anesthesia                            care (MAC). Immediately prior to administration of                            medications, the patient was re-assessed for                            adequacy to receive sedatives. The heart rate,                            respiratory rate, oxygen saturations, blood                            pressure, adequacy of pulmonary ventilation, and                            response to care were monitored throughout the                            procedure. The physical status of the patient was                            re-assessed after the procedure.                           After  obtaining informed consent, the endoscope was                            passed under direct vision. Throughout the                            procedure, the patient's blood pressure, pulse, and                            oxygen saturations were monitored continuously. The                            [id number]-H190 ([id number]) Olympus endoscope was introduced                            through the mouth, and advanced to the afferent and                            efferent jejunal loops. The upper GI endoscopy was                            accomplished without difficulty. The patient                            tolerated the procedure well. Scope In: Scope Out: Findings:      Islands of salmon-colored mucosa were present at 34 cm. These were seen       and biopsied on previous EGD. No other visible abnormalities were       present. The maximum longitudinal extent of these esophageal mucosal       changes was 1 cm in length. Biopsies were taken with a cold forceps for        histology. Verification of patient identification for the specimen was       done. Estimated blood loss was minimal.      The entire examined stomach was normal. Gastric pouch about 8 cm in       length from 37 to 45cm Biopsies were taken with a cold forceps for       Helicobacter pylori testing. Verification of patient identification for       the specimen was done. Estimated blood loss was minimal.      A few scars within 3 cm of the gastrojejunostomy with the appearance of       healing marginal ulcers. Biopsies were taken with a cold forceps for       histology. Verification of patient identification for the specimen was       done. Estimated blood loss was minimal. Impression:               - Salmon-colored mucosa. Biopsied.                           - Normal stomach. Biopsied.                           - Jejunal spot (area). Biopsied. Moderate Sedation:      Moderate (conscious) sedation was personally administered by an  anesthesia professional. The following parameters were monitored: oxygen       saturation, heart rate, blood pressure, and response to care. Total       physician intraservice time was 15 minutes. Recommendation:           - Discharge patient to home.                           - Resume previous diet.                           - Continue present medications.                           - Await pathology results. Procedure Code(s):        --- Professional ---                           [phone], Esophagogastroduodenoscopy, flexible,                            transoral; with biopsy, single or multiple Diagnosis Code(s):        --- Professional ---                           K22.89, Other specified disease of esophagus                           Z98.0, Intestinal bypass and anastomosis status CPT copyright 2022 American Medical Association. All rights reserved. The codes documented in this report are preliminary and upon coder review may  be revised to meet current  compliance requirements. Deward [name],  10/28/2023 2:19:12 PM This report has been signed electronically. Number of Addenda: 0

## 2023-10-28 NOTE — Progress Notes (Signed)
 Iv zofran given per dr ellender for nausea and vomiting post procedure

## 2023-10-29 LAB — SURGICAL PATHOLOGY

## 2023-10-29 NOTE — Anesthesia Postprocedure Evaluation (Signed)
 Anesthesia Post Note  Patient: Elizabeth [name]  Procedure(s) Performed: UPPER ENDOSCOPY BIOPSY     Patient location during evaluation: Endoscopy Anesthesia Type: MAC Level of consciousness: awake Pain management: pain level controlled Vital Signs Assessment: post-procedure vital signs reviewed and stable Respiratory status: spontaneous breathing, nonlabored ventilation and respiratory function stable Cardiovascular status: blood pressure returned to baseline and stable Postop Assessment: no apparent nausea or vomiting Anesthetic complications: no   No notable events documented.  Last Vitals:  Vitals:   10/28/23 1430 10/28/23 1440  BP: 133/88 126/83  Pulse: 84 86  Resp: 10 16  Temp:    SpO2: 98% 99%    Last Pain:  Vitals:   10/28/23 1440  TempSrc:   PainSc: 0-No pain                 Zeplin Aleshire P Carime Dinkel

## 2023-10-30 ENCOUNTER — Encounter (HOSPITAL_COMMUNITY): Payer: Self-pay | Admitting: Surgery

## 2023-11-05 ENCOUNTER — Encounter: Payer: Self-pay | Admitting: "Endocrinology

## 2023-11-05 ENCOUNTER — Telehealth (INDEPENDENT_AMBULATORY_CARE_PROVIDER_SITE_OTHER): Payer: Medicaid Other | Admitting: "Endocrinology

## 2023-11-05 VITALS — Ht 63.0 in

## 2023-11-05 DIAGNOSIS — E7800 Pure hypercholesterolemia, unspecified: Secondary | ICD-10-CM | POA: Diagnosis not present

## 2023-11-05 DIAGNOSIS — Z7984 Long term (current) use of oral hypoglycemic drugs: Secondary | ICD-10-CM | POA: Diagnosis not present

## 2023-11-05 DIAGNOSIS — E1165 Type 2 diabetes mellitus with hyperglycemia: Secondary | ICD-10-CM | POA: Diagnosis not present

## 2023-11-05 NOTE — Patient Instructions (Signed)

## 2023-11-05 NOTE — Progress Notes (Signed)
 The patient reports they are currently: Elizabeth [name]. This visit was conducted via video with the patient on the date of service and took 6-7 min. I spent an additional about  5 minutes on pre- and post-visit activities on the date of service.   The patient was physically located in [place]  or a state in which I am permitted to provide care. The patient and/or parent/guardian understood that s/he may incur co-pays and cost sharing, and agreed to the telemedicine visit. The visit was reasonable and appropriate under the circumstances given the patient's presentation at the time.  The patient and/or parent/guardian has been advised of the potential risks and limitations of this mode of treatment (including, but not limited to, the absence of in-person examination) and has agreed to be treated using telemedicine. The patient's/patient's family's questions regarding telemedicine have been answered.   The patient and/or parent/guardian has also been advised to contact their provider's office for worsening conditions, and seek emergency medical treatment and/or call 911 if the patient deems either necessary.    Outpatient Endocrinology Note Jorge Newcomer, MD  11/05/23   Elizabeth [name] 07-29-1976 [id number]  Referring Provider: Dorrene Gaucher, NP Primary Care Provider: Dorrene Gaucher, NP Reason for consultation: Subjective   Assessment & Plan  Elizabeth [name] was seen today for follow-up.  Diagnoses and all orders for this visit:  Uncontrolled type 2 diabetes mellitus with hyperglycemia (HCC)  Long term (current) use of oral hypoglycemic drugs  Pure hypercholesterolemia   Diabetes Type II complicated by hyperglycemia  History of RYGB Lab Results  Component Value Date   GFR 91.87 08/25/2023   Hba1c goal less than 7, current Hba1c is  Lab Results  Component Value Date   HGBA1C 9.0 (H) 09/26/2023   Will recommend the following: Farxiga  10 mg every day Metformin  XR 500 mg 2  pills bid Start Trulicity  0.75mg /week, stop Januvia  100 mg every day PRN Humalog : 3 units if blood sugar is more than 200, 5 units if blood sugar is more than 250, 7 units if blood sugar is more than 300 Ordered libre  Xigduo /libre/DexCom is not covered  Trulicity  was stopped due to pt needing feeding tube to nausea/vomiting after RYGB in 09/2022  No known contraindications/side effects to any of above medications  -Last LD and Tg are as follows: Lab Results  Component Value Date   LDLCALC 94 09/26/2023    Lab Results  Component Value Date   TRIG 133 09/26/2023   -On rosuvatstain 5 mg every day -Repeat labs and CMP to watch for AST/ALT -Follow low fat diet and exercise   -Blood pressure goal <140/90 - Microalbumin/creatinine goal is < 30 -Last MA/Cr is as follows: Lab Results  Component Value Date   MICROALBUR 5.7 (H) 05/28/2023   -on ACE/ARB lisinopril  5 mg every day  -diet changes including salt restriction -limit eating outside -counseled BP targets per standards of diabetes care -uncontrolled blood pressure can lead to retinopathy, nephropathy and cardiovascular and atherosclerotic heart disease  Reviewed and counseled on: -A1C target -Blood sugar targets -Complications of uncontrolled diabetes  -Checking blood sugar before meals and bedtime and bring log next visit -All medications with mechanism of action and side effects -Hypoglycemia management: rule of 15's, Glucagon Emergency Kit and medical alert ID -low-carb low-fat plate-method diet -At least 20 minutes of physical activity per day -Annual dilated retinal eye exam and foot exam -compliance and follow up needs -follow up as scheduled or earlier if problem gets worse  Call if blood sugar  is less than 70 or consistently above 250    Take a 15 gm snack of carbohydrate at bedtime before you go to sleep if your blood sugar is less than 100.    If you are going to fast after midnight for a test or  procedure, ask your physician for instructions on how to reduce/decrease your insulin  dose.    Call if blood sugar is less than 70 or consistently above 250  -Treating a low sugar by rule of 15  (15 gms of sugar every 15 min until sugar is more than 70) If you feel your sugar is low, test your sugar to be sure If your sugar is low (less than 70), then take 15 grams of a fast acting Carbohydrate (3-4 glucose tablets or glucose gel or 4 ounces of juice or regular soda) Recheck your sugar 15 min after treating low to make sure it is more than 70 If sugar is still less than 70, treat again with 15 grams of carbohydrate          Don't drive the hour of hypoglycemia  If unconscious/unable to eat or drink by mouth, use glucagon injection or nasal spray baqsimi and call 911. Can repeat again in 15 min if still unconscious.  Return in about 4 weeks (around 12/03/2023).   I have reviewed current medications, nurse's notes, allergies, vital signs, past medical and surgical history, family medical history, and social history for this encounter. Counseled patient on symptoms, examination findings, lab findings, imaging results, treatment decisions and monitoring and prognosis. The patient understood the recommendations and agrees with the treatment plan. All questions regarding treatment plan were fully answered.  Jorge Newcomer, MD  11/05/23    History of Present Illness Elizabeth [name] is a 48 y.o. year old female who presents for evaluation of Type II diabetes mellitus.  [name] was first diagnosed in 2009.   Diabetes education +  Home diabetes regimen: Farxiga  5 mg every day Metformin  XR 500 mg bid Xigduo  2.02/999 mg every day, pt wants tor resume again, was discontinued after RYGB, xigduo  not covered now  Januvia  100 mg every day  Xigduo  is not covered, DexCom is not covered Trulicity  was stopped due to pt needing feeding tube to nausea/vomiting after RYGB in 09/2022 Did well with  xigduo  bid in past  COMPLICATIONS -  MI/Stroke -  retinopathy -  neuropathy -  nephropathy  SYMPTOMS REVIEWED - Polyuria + Weight loss, has RYGB 09/30/22; pre-surgery 316 lb - Blurred vision  BLOOD SUGAR DATA Checks twice a day, fasting or before dinner  Range: 128-181  Physical Exam  Ht 5\' 3"  (1.6 m)   BMI 47.30 kg/m    Constitutional: well developed, well nourished Head: normocephalic, atraumatic Eyes: sclera anicteric, no redness Neck: supple Lungs: normal respiratory effort Neurology: alert and oriented Skin: dry, no appreciable rashes Musculoskeletal: no appreciable defects Psychiatric: normal mood and affect Diabetic Foot Exam - Simple   No data filed      Current Medications Patient's Medications  New Prescriptions   No medications on file  Previous Medications   ACCU-CHEK SOFTCLIX LANCETS LANCETS    4 (four) times daily.   BLOOD GLUCOSE MONITORING SUPPL DEVI    1 each by Does not apply route in the morning, at noon, and at bedtime. May substitute to any manufacturer covered by patient's insurance.   CALCIUM -VITAMIN D  PO    Take 2 tablets by mouth every evening.   CONTINUOUS GLUCOSE SENSOR (FREESTYLE LIBRE  3 PLUS SENSOR) MISC    Change sensor every 15 days.   DAPAGLIFLOZIN  PROPANEDIOL (FARXIGA ) 10 MG TABS TABLET    Take 1 tablet (10 mg total) by mouth daily before breakfast.   DULAGLUTIDE  (TRULICITY ) 0.75 MG/0.5ML SOAJ    Inject 0.75 mg into the skin once a week.   ESCITALOPRAM  (LEXAPRO ) 20 MG TABLET    Take 1 tablet (20 mg total) by mouth daily.   ESOMEPRAZOLE  (NEXIUM ) 40 MG CAPSULE    Take 1 capsule (40 mg total) by mouth 2 (two) times daily. Open capsule and sprinkle on food at breakfast and dinner for better absorption.   FAMOTIDINE  (PEPCID ) 20 MG TABLET    Take 1 tablet (20 mg total) by mouth 2 (two) times daily.   GABAPENTIN  (NEURONTIN ) 300 MG CAPSULE    Take 1 capsule (300 mg total) by mouth 3 (three) times daily.   GLUCOSE BLOOD (BLOOD GLUCOSE TEST  STRIPS) STRP    1 each by In Vitro route in the morning, at noon, and at bedtime. May substitute to any manufacturer covered by patient's insurance.   HYDROCODONE-ACETAMINOPHEN  (NORCO) 10-325 MG TABLET    Take by mouth.   HYDROXYZINE  (ATARAX ) 25 MG TABLET    Take 2 tablets by mouth at bedtime as needed for insomnia.   INSULIN  LISPRO (HUMALOG  KWIKPEN) 100 UNIT/ML KWIKPEN    Inject 3-7 Units into the skin 3 (three) times daily. 3 units if blood sugar is more than 200, 5 units if blood sugar is more than 250, 7 units if blood sugar is more than 300   JANUVIA  100 MG TABLET    Take 100 mg by mouth daily.   LISINOPRIL  (ZESTRIL ) 5 MG TABLET    Take 1 tablet (5 mg total) by mouth daily.   METFORMIN  (GLUCOPHAGE -XR) 500 MG 24 HR TABLET    Take 2 tablets (1,000 mg total) by mouth 2 (two) times daily with a meal.   MULTIPLE VITAMIN (MULTIVITAMIN WITH MINERALS) TABS TABLET    Take 2 tablets by mouth every evening.   NALOXONE (NARCAN) NASAL SPRAY 4 MG/0.1 ML    SMARTSIG:Both Nares   ONDANSETRON  (ZOFRAN -ODT) 4 MG DISINTEGRATING TABLET    DISSOLVE 1 TABLET(4 MG) ON THE TONGUE EVERY 8 HOURS AS NEEDED FOR NAUSEA OR VOMITING   ROSUVASTATIN  (CRESTOR ) 5 MG TABLET    TAKE 1 TABLET(5 MG) BY MOUTH DAILY   SUCRALFATE  (CARAFATE ) 1 GM/10ML SUSPENSION    Take 10 mLs (1 g total) by mouth 4 (four) times daily.  Modified Medications   No medications on file  Discontinued Medications   No medications on file    Allergies Allergies  Allergen Reactions   Tomato Hives, Itching and Rash    Past Medical History Past Medical History:  Diagnosis Date   Anemia    Anxiety    Astigmatism    Depression    Diabetes mellitus without complication (HCC)    Dry mouth    Fatigue    GERD (gastroesophageal reflux disease)    Heartburn    Hepatic steatosis    Hepatomegaly    High cholesterol    Hypertension    Keratoconjunctivitis    Liver disease    Muscle pain    Nausea and vomiting 04/21/2023   Ovarian cyst    Positive  ANA (antinuclear antibody)    Pulmonary nodules    Vitamin D  deficiency    Weakness     Past Surgical History Past Surgical History:  Procedure Laterality Date  BIOPSY  01/18/2021   Procedure: BIOPSY;  Surgeon: Annis Kinder, DO;  Location: [facility];  Service: Gastroenterology;;   BIOPSY  10/28/2023   Procedure: BIOPSY;  Surgeon: Junie Olds, MD;  Location: [facility];  Service: General;;   CHOLECYSTECTOMY N/A 05/11/2021   Procedure: LAPAROSCOPIC CHOLECYSTECTOMY WITH INTRAOPERATIVE CHOLANGIOGRAM;  Surgeon: Junie Olds, MD;  Location: [facility];  Service: General;  Laterality: N/A;   DILATION AND CURETTAGE OF UTERUS     ENDOMETRIAL BIOPSY  12/19/2017   normal per pt   ESOPHAGOGASTRODUODENOSCOPY N/A 10/28/2023   Procedure: UPPER ENDOSCOPY;  Surgeon: Junie Olds, MD;  Location: [facility];  Service: General;  Laterality: N/A;   ESOPHAGOGASTRODUODENOSCOPY (EGD) WITH PROPOFOL  N/A 01/18/2021   Procedure: ESOPHAGOGASTRODUODENOSCOPY (EGD) WITH PROPOFOL ;  Surgeon: Annis Kinder, DO;  Location: [facility];  Service: Gastroenterology;  Laterality: N/A;   HIATAL HERNIA REPAIR N/A 09/30/2022   Procedure: HERNIA REPAIR HIATAL;  Surgeon: Junie Olds, MD;  Location: [facility];  Service: General;  Laterality: N/A;   LAPAROSCOPIC INSERTION GASTROSTOMY TUBE N/A 12/02/2022   Procedure: LAPAROSCOPIC REMNANT GASTROSTOMY G TUBE;  Surgeon: Junie Olds, MD;  Location: [facility];  Service: General;  Laterality: N/A;   LAPAROSCOPIC ROUX-EN-Y GASTRIC BYPASS WITH HIATAL HERNIA REPAIR  09/30/2022   LAPAROSCOPY N/A 12/02/2022   Procedure: LAPAROSCOPY DIAGNOSTIC;  Surgeon: Junie Olds, MD;  Location: [facility];  Service: General;  Laterality: N/A;   TONSILLECTOMY     TUBAL LIGATION     UPPER GI ENDOSCOPY N/A 09/30/2022   Procedure: UPPER GI ENDOSCOPY;  Surgeon: Junie Olds, MD;  Location: [facility];  Service: General;  Laterality: N/A;   UPPER GI  ENDOSCOPY N/A 12/02/2022   Procedure: UPPER ENDOSCOPY;  Surgeon: Junie Olds, MD;  Location: [facility];  Service: General;  Laterality: N/A;    Family History family history includes Alcohol abuse in her father; Diabetes in her mother; Drug abuse in her father; HIV in her father; Hyperlipidemia in her mother; Hypertension in her mother; Lupus in her maternal aunt; Obesity in her mother; Ovarian cancer in her maternal grandmother; Peripheral Artery Disease in her mother.  Social History Social History   Socioeconomic History   Marital status: Widowed    Spouse name: Sylvester Evert   Number of children: 2   Years of education: Not on file   Highest education level: Associate degree: occupational, Scientist, product/process development, or vocational program  Occupational History   Occupation: CMA - Geophysicist/field seismologist: [employer]  Tobacco Use   Smoking status: Former    Current packs/day: 0.00    Average packs/day: 0.5 packs/day for 3.0 years (1.5 ttl pk-yrs)    Types: Cigarettes    Start date: 08/21/2018    Quit date: 08/21/2021    Years since quitting: 2.2    Passive exposure: Never   Smokeless tobacco: Never   Tobacco comments:    4-5/day  Vaping Use   Vaping status: Never Used  Substance and Sexual Activity   Alcohol use: No   Drug use: No   Sexual activity: Yes    Partners: Male    Birth control/protection: Surgical, Implant  Other Topics Concern   Not on file  Social History Narrative    2 children   1997- son Orson Blalock   2000- son Designer, jewellery in [place] [facility]- CMA in [place]   Enjoys reading   Widowed 08-09-24, husband died from COVID-19.   Social Drivers of Health  Financial Resource Strain: Medium Risk (08/18/2023)   Overall Financial Resource Strain (CARDIA)    Difficulty of Paying Living Expenses: Somewhat hard  Food Insecurity: No Food Insecurity (08/18/2023)   Hunger Vital Sign    Worried About Running Out of Food in the Last Year: Never true    Ran Out of Food in  the Last Year: Never true  Transportation Needs: No Transportation Needs (08/18/2023)   PRAPARE - Administrator, Civil Service (Medical): No    Lack of Transportation (Non-Medical): No  Physical Activity: Inactive (08/18/2023)   Exercise Vital Sign    Days of Exercise per Week: 0 days    Minutes of Exercise per Session: 10 min  Stress: Stress Concern Present (08/18/2023)   [employer] of Occupational Health - Occupational Stress Questionnaire    Feeling of Stress : To some extent  Social Connections: Moderately Isolated (08/18/2023)   Social Connection and Isolation Panel [NHANES]    Frequency of Communication with Friends and Family: Twice a week    Frequency of Social Gatherings with Friends and Family: Never    Attends Religious Services: More than 4 times per year    Active Member of [employer] or Organizations: No    Attends Engineer, structural: More than 4 times per year    Marital Status: Widowed  Intimate Partner Violence: Not At Risk (12/02/2022)   Humiliation, Afraid, Rape, and Kick questionnaire    Fear of Current or Ex-Partner: No    Emotionally Abused: No    Physically Abused: No    Sexually Abused: No    Lab Results  Component Value Date   HGBA1C 9.0 (H) 09/26/2023   HGBA1C 8.7 (H) 05/28/2023   HGBA1C 9.1 (H) 03/07/2023   Lab Results  Component Value Date   CHOL 178 09/26/2023   Lab Results  Component Value Date   HDL 61 09/26/2023   Lab Results  Component Value Date   LDLCALC 94 09/26/2023   Lab Results  Component Value Date   TRIG 133 09/26/2023   Lab Results  Component Value Date   CHOLHDL 2.9 09/26/2023   Lab Results  Component Value Date   CREATININE 0.67 09/26/2023   Lab Results  Component Value Date   GFR 91.87 08/25/2023   Lab Results  Component Value Date   MICROALBUR 5.7 (H) 05/28/2023      Component Value Date/Time   NA 139 09/26/2023 0809   NA 137 02/27/2022 0000   K 4.4 09/26/2023 0809   CL 104  09/26/2023 0809   CO2 24 09/26/2023 0809   GLUCOSE 173 (H) 09/26/2023 0809   BUN 11 09/26/2023 0809   BUN 7 12/22/2018 1430   CREATININE 0.67 09/26/2023 0809   CALCIUM  9.4 09/26/2023 0809   PROT 7.1 09/26/2023 0809   PROT 6.8 12/22/2018 1430   ALBUMIN 3.9 08/25/2023 1039   ALBUMIN 4.3 12/22/2018 1430   AST 22 09/26/2023 0809   ALT 25 09/26/2023 0809   ALKPHOS 94 08/25/2023 1039   BILITOT 0.2 09/26/2023 0809   BILITOT 0.3 12/22/2018 1430   GFRNONAA >60 07/14/2023 1507   GFRAA 133 12/22/2018 1430      Latest Ref Rng & Units 09/26/2023    8:09 AM 08/25/2023   10:39 AM 07/14/2023    3:07 PM  BMP  Glucose 65 - 99 mg/dL 147  829  562   BUN 7 - 25 mg/dL 11  10  8    Creatinine 0.50 - 0.99 mg/dL  0.67  0.78  0.73   BUN/Creat Ratio 6 - 22 (calc) SEE NOTE:     Sodium 135 - 146 mmol/L 139  138  137   Potassium 3.5 - 5.3 mmol/L 4.4  4.4  4.3   Chloride 98 - 110 mmol/L 104  103  101   CO2 20 - 32 mmol/L 24  26  22    Calcium  8.6 - 10.2 mg/dL 9.4  9.2  9.2        Component Value Date/Time   WBC 5.9 07/14/2023 1507   RBC 5.43 (H) 07/14/2023 1507   HGB 12.7 07/14/2023 1507   HGB 13.3 12/22/2018 1430   HCT 41.0 07/14/2023 1507   HCT 43.4 12/22/2018 1430   PLT 262 07/14/2023 1507   PLT 301 12/22/2018 1430   MCV 75.5 (L) 07/14/2023 1507   MCV 73 (L) 12/22/2018 1430   MCH 23.4 (L) 07/14/2023 1507   MCHC 31.0 07/14/2023 1507   RDW 14.9 07/14/2023 1507   RDW 15.9 (H) 12/22/2018 1430   LYMPHSABS 2.1 11/24/2022 0305   LYMPHSABS 2.1 12/22/2018 1430   MONOABS 0.4 11/24/2022 0305   EOSABS 0.1 11/24/2022 0305   EOSABS 0.1 12/22/2018 1430   BASOSABS 0.0 11/24/2022 0305   BASOSABS 0.1 12/22/2018 1430     Parts of this note may have been dictated using voice recognition software. There may be variances in spelling and vocabulary which are unintentional. Not all errors are proofread. Please notify the [facility] if any discrepancies are noted or if the meaning of any statement is not clear.

## 2023-11-10 ENCOUNTER — Encounter: Payer: Self-pay | Admitting: "Endocrinology

## 2023-11-12 ENCOUNTER — Encounter: Payer: Self-pay | Admitting: Family

## 2023-11-12 MED ORDER — DEXCOM G7 SENSOR MISC: 3 refills | Status: DC

## 2023-11-17 ENCOUNTER — Telehealth (INDEPENDENT_AMBULATORY_CARE_PROVIDER_SITE_OTHER): Payer: Medicaid Other | Admitting: Family

## 2023-11-17 DIAGNOSIS — K259 Gastric ulcer, unspecified as acute or chronic, without hemorrhage or perforation: Secondary | ICD-10-CM | POA: Diagnosis not present

## 2023-11-17 DIAGNOSIS — M25512 Pain in left shoulder: Secondary | ICD-10-CM

## 2023-11-17 DIAGNOSIS — M25511 Pain in right shoulder: Secondary | ICD-10-CM

## 2023-11-17 DIAGNOSIS — Z6841 Body Mass Index (BMI) 40.0 and over, adult: Secondary | ICD-10-CM | POA: Diagnosis not present

## 2023-11-17 DIAGNOSIS — F32A Depression, unspecified: Secondary | ICD-10-CM

## 2023-11-17 NOTE — Assessment & Plan Note (Signed)
  Difficulty with adequate protein intake due to gastric discomfort. Recent weight loss to 268 lbs. -Continue follow-up with nutritionist, focusing on protein intake and symptom management rather than weight loss at this time.

## 2023-11-17 NOTE — Assessment & Plan Note (Signed)
  Chronic, worsening over the past few weeks. No recent imaging or physical therapy. -Refer to sports medicine for further evaluation and management.

## 2023-11-17 NOTE — Assessment & Plan Note (Signed)
  Patient reports worsening symptoms and desire to return to therapy. -Encourage patient to schedule appointment with a therapist. -Continue Lexapro 20mg  daily. -Consider medication adjustment if no improvement after initiation of therapy.

## 2023-11-17 NOTE — Patient Instructions (Signed)
VISIT SUMMARY:  During today's visit, we discussed your ongoing bilateral shoulder pain, gastrointestinal issues, type 2 diabetes management, nutritional concerns, and depression. We have outlined a plan to address each of these issues and provided recommendations for follow-up care.  YOUR PLAN:  -BILATERAL SHOULDER PAIN: You have been experiencing worsening pain in both shoulders over the past few weeks. This pain was initially noted last year but has become more severe recently. We will refer you to a sports medicine specialist for further evaluation and management.  -GASTRIC ULCERS: Your recent endoscopy showed healing ulcers, but you still have symptoms and concerns about other possible issues. You will follow up with your surgeon on November 19, 2023, for further discussion.  -TYPE 2 DIABETES: You have type 2 diabetes and recently restarted Trulicity, which has caused some expected stomach upset. Continue taking Trulicity and use anti-nausea medication as needed.  -NUTRITION/WEIGHT MANAGEMENT: You have been losing weight and are currently at 268 lbs, but the focus is on managing your protein intake and symptoms rather than weight loss. Continue to follow up with your nutritionist to address these concerns.  -DEPRESSION: You are experiencing worsening depression and are looking to return to therapy. Please schedule an appointment with a therapist. Continue taking Lexapro 20mg  daily, and we may consider adjusting your medication if there is no improvement after starting therapy.  INSTRUCTIONS:  Please follow up with the sports medicine specialist for your shoulder pain and with your surgeon on November 19, 2023, for your gastrointestinal issues. Continue taking Trulicity and anti-nausea medication as needed. Schedule an appointment with a therapist for your depression. Your next follow-up appointment with [name] is on December 26, 2023.

## 2023-11-17 NOTE — Assessment & Plan Note (Signed)
  Recent endoscopy showed healing ulcers. Patient reports ongoing symptoms and concern for additional undiagnosed issues. -Follow-up with surgeon on 11/19/2023 for further discussion.

## 2023-11-17 NOTE — Progress Notes (Signed)
MyChart Video Visit    Virtual Visit via Video Note    Patient location: Home. Patient and provider in visit Provider location: Office  I discussed the limitations of evaluation and management by telemedicine and the availability of in person appointments. The patient expressed understanding and agreed to proceed.  Visit Date: 11/17/2023  Today's healthcare provider: Lemont Fillers, NP     Subjective:    Patient ID: Elizabeth [name], female    DOB: 26-Mar-1976, 48 y.o.   MRN: [id number]  Chief Complaint  Patient presents with   Shoulder Pain    Patient complains of bilateral shoulder pain for a few weeks.     Shoulder Pain     The patient presents with c/o bilateral shoulder pain that has been worsening over the past few weeks. The pain was initially noted during a rheumatology consultation last year but was not severe at that time. The patient was last seen by her rheumatologist in April of the previous year.  In addition to the shoulder pain, the patient has been dealing with gastrointestinal issues. She underwent an endoscopy recently, which showed healing ulcers. However, the patient reports that the physician suspects an additional, unidentified issue.  The patient's blood sugar management has been adjusted recently by Endocrinology, with the reintroduction of Trulicity.   Despite struggling with food consumption due to pain, the patient has managed to lose weight, currently weighing 268 lbs. However, the focus of the nutritional intervention is not on weight loss at this time.  The patient also reports struggling with depression and is actively seeking a therapist. She is currently on Lexapro 20 for management of her depression.  Past Medical History:  Diagnosis Date   Anemia    Anxiety    Astigmatism    Depression    Diabetes mellitus without complication (HCC)    Dry mouth    Fatigue    GERD (gastroesophageal reflux disease)    Heartburn    Hepatic  steatosis    Hepatomegaly    High cholesterol    Hypertension    Keratoconjunctivitis    Liver disease    Muscle pain    Nausea and vomiting 04/21/2023   Ovarian cyst    Positive ANA (antinuclear antibody)    Pulmonary nodules    Vitamin D deficiency    Weakness     Past Surgical History:  Procedure Laterality Date   BIOPSY  01/18/2021   Procedure: BIOPSY;  Surgeon: Shellia Cleverly, DO;  Location: [facility];  Service: Gastroenterology;;   BIOPSY  10/28/2023   Procedure: BIOPSY;  Surgeon: Quentin Ore, MD;  Location: [facility];  Service: General;;   CHOLECYSTECTOMY N/A 05/11/2021   Procedure: LAPAROSCOPIC CHOLECYSTECTOMY WITH INTRAOPERATIVE CHOLANGIOGRAM;  Surgeon: Quentin Ore, MD;  Location: [facility];  Service: General;  Laterality: N/A;   DILATION AND CURETTAGE OF UTERUS     ENDOMETRIAL BIOPSY  12/19/2017   normal per pt   ESOPHAGOGASTRODUODENOSCOPY N/A 10/28/2023   Procedure: UPPER ENDOSCOPY;  Surgeon: Quentin Ore, MD;  Location: [facility];  Service: General;  Laterality: N/A;   ESOPHAGOGASTRODUODENOSCOPY (EGD) WITH PROPOFOL N/A 01/18/2021   Procedure: ESOPHAGOGASTRODUODENOSCOPY (EGD) WITH PROPOFOL;  Surgeon: Shellia Cleverly, DO;  Location: [facility];  Service: Gastroenterology;  Laterality: N/A;   HIATAL HERNIA REPAIR N/A 09/30/2022   Procedure: HERNIA REPAIR HIATAL;  Surgeon: Quentin Ore, MD;  Location: [facility];  Service: General;  Laterality: N/A;   LAPAROSCOPIC INSERTION GASTROSTOMY TUBE N/A 12/02/2022  Procedure: LAPAROSCOPIC REMNANT GASTROSTOMY G TUBE;  Surgeon: Quentin Ore, MD;  [facility]: [facility];  Service: General;  Laterality: N/A;   LAPAROSCOPIC ROUX-EN-Y GASTRIC BYPASS WITH HIATAL HERNIA REPAIR  09/30/2022   LAPAROSCOPY N/A 12/02/2022   Procedure: LAPAROSCOPY DIAGNOSTIC;  Surgeon: Quentin Ore, MD;  Location: [facility];  Service: General;  Laterality: N/A;   TONSILLECTOMY     TUBAL LIGATION     UPPER GI  ENDOSCOPY N/A 09/30/2022   Procedure: UPPER GI ENDOSCOPY;  Surgeon: Quentin Ore, MD;  [facility]: [facility];  Service: General;  Laterality: N/A;   UPPER GI ENDOSCOPY N/A 12/02/2022   Procedure: UPPER ENDOSCOPY;  Surgeon: Quentin Ore, MD;  Location: [facility];  Service: General;  Laterality: N/A;    Family History  Problem Relation Age of Onset   Diabetes Mother    Hypertension Mother    Hyperlipidemia Mother    Obesity Mother    Peripheral Artery Disease Mother    HIV Father        died from complications (IVDU)   Alcohol abuse Father    Drug abuse Father    Lupus Maternal Aunt    Ovarian cancer Maternal Grandmother    Colon cancer Neg Hx    Esophageal cancer Neg Hx    Liver disease Neg Hx    Pancreatic cancer Neg Hx    Stomach cancer Neg Hx    Sleep apnea Neg Hx    Rectal cancer Neg Hx     Social History   Socioeconomic History   Marital status: Widowed    Spouse name: Mellody Dance   Number of children: 2   Years of education: Not on file   Highest education level: Associate degree: occupational, Scientist, product/process development, or vocational program  Occupational History   Occupation: CMA - Geophysicist/field seismologist: [employer]  Tobacco Use   Smoking status: Former    Current packs/day: 0.00    Average packs/day: 0.5 packs/day for 3.0 years (1.5 ttl pk-yrs)    Types: Cigarettes    Start date: 08/21/2018    Quit date: 08/21/2021    Years since quitting: 2.2    Passive exposure: Never   Smokeless tobacco: Never   Tobacco comments:    4-5/day  Vaping Use   Vaping status: Never Used  Substance and Sexual Activity   Alcohol use: No   Drug use: No   Sexual activity: Yes    Partners: Male    Birth control/protection: Surgical, Implant  Other Topics Concern   Not on file  Social History Narrative    2 children   1997- son Collier Bullock   2000- son Designer, jewellery in [place] [facility]- CMA in [place]   Enjoys reading   Widowed 2024/07/15, husband died from COVID-19.   Social  Drivers of Health   Financial Resource Strain: Medium Risk (08/18/2023)   Overall Financial Resource Strain (CARDIA)    Difficulty of Paying Living Expenses: Somewhat hard  Food Insecurity: No Food Insecurity (08/18/2023)   Hunger Vital Sign    Worried About Running Out of Food in the Last Year: Never true    Ran Out of Food in the Last Year: Never true  Transportation Needs: No Transportation Needs (08/18/2023)   PRAPARE - Administrator, Civil Service (Medical): No    Lack of Transportation (Non-Medical): No  Physical Activity: Inactive (08/18/2023)   Exercise Vital Sign    Days of Exercise per Week: 0 days  Minutes of Exercise per Session: 10 min  Stress: Stress Concern Present (08/18/2023)   [employer] of Occupational Health - Occupational Stress Questionnaire    Feeling of Stress : To some extent  Social Connections: Moderately Isolated (08/18/2023)   Social Connection and Isolation Panel [NHANES]    Frequency of Communication with Friends and Family: Twice a week    Frequency of Social Gatherings with Friends and Family: Never    Attends Religious Services: More than 4 times per year    Active Member of [employer] or Organizations: No    Attends Engineer, structural: More than 4 times per year    Marital Status: Widowed  Intimate Partner Violence: Not At Risk (12/02/2022)   Humiliation, Afraid, Rape, and Kick questionnaire    Fear of Current or Ex-Partner: No    Emotionally Abused: No    Physically Abused: No    Sexually Abused: No    Outpatient Medications Prior to Visit  Medication Sig Dispense Refill   Accu-Chek Softclix Lancets lancets 4 (four) times daily.     Blood Glucose Monitoring Suppl DEVI 1 each by Does not apply route in the morning, at noon, and at bedtime. May substitute to any manufacturer covered by patient's insurance. 1 each 0   CALCIUM-VITAMIN D PO Take 2 tablets by mouth every evening.     Continuous Glucose Sensor (DEXCOM G7  SENSOR) MISC Use to check glucose continuously, change sensor every 10 days 9 each 3   dapagliflozin propanediol (FARXIGA) 10 MG TABS tablet Take 1 tablet (10 mg total) by mouth daily before breakfast. 90 tablet 1   Dulaglutide (TRULICITY) 0.75 MG/0.5ML SOAJ Inject 0.75 mg into the skin once a week. 2 mL 0   escitalopram (LEXAPRO) 20 MG tablet Take 1 tablet (20 mg total) by mouth daily. 90 tablet 1   esomeprazole (NEXIUM) 40 MG capsule Take 1 capsule (40 mg total) by mouth 2 (two) times daily. Open capsule and sprinkle on food at breakfast and dinner for better absorption. 180 capsule 3   famotidine (PEPCID) 20 MG tablet Take 1 tablet (20 mg total) by mouth 2 (two) times daily. 180 tablet 0   gabapentin (NEURONTIN) 300 MG capsule Take 1 capsule (300 mg total) by mouth 3 (three) times daily. 270 capsule 1   Glucose Blood (BLOOD GLUCOSE TEST STRIPS) STRP 1 each by In Vitro route in the morning, at noon, and at bedtime. May substitute to any manufacturer covered by patient's insurance. 250 strip 0   HYDROcodone-acetaminophen (NORCO) 10-325 MG tablet Take by mouth.     hydrOXYzine (ATARAX) 25 MG tablet Take 2 tablets by mouth at bedtime as needed for insomnia. 60 tablet 3   lisinopril (ZESTRIL) 5 MG tablet Take 1 tablet (5 mg total) by mouth daily. 90 tablet 1   metFORMIN (GLUCOPHAGE-XR) 500 MG 24 hr tablet Take 2 tablets (1,000 mg total) by mouth 2 (two) times daily with a meal. 60 tablet 4   Multiple Vitamin (MULTIVITAMIN WITH MINERALS) TABS tablet Take 2 tablets by mouth every evening.     naloxone (NARCAN) nasal spray 4 mg/0.1 mL SMARTSIG:Both Nares     ondansetron (ZOFRAN-ODT) 4 MG disintegrating tablet DISSOLVE 1 TABLET(4 MG) ON THE TONGUE EVERY 8 HOURS AS NEEDED FOR NAUSEA OR VOMITING 90 tablet 1   rosuvastatin (CRESTOR) 5 MG tablet TAKE 1 TABLET(5 MG) BY MOUTH DAILY 90 tablet 1   sucralfate (CARAFATE) 1 GM/10ML suspension Take 10 mLs (1 g total) by mouth 4 (four)  times daily. 1200 mL 0   insulin  lispro (HUMALOG KWIKPEN) 100 UNIT/ML KwikPen Inject 3-7 Units into the skin 3 (three) times daily. 3 units if blood sugar is more than 200, 5 units if blood sugar is more than 250, 7 units if blood sugar is more than 300 9 mL 1   JANUVIA 100 MG tablet Take 100 mg by mouth daily.     Facility-Administered Medications Prior to Visit  Medication Dose Route Frequency Provider Last Rate Last Admin   sodium chloride 0.9 % 1,000 mL with thiamine 100 mg, folic acid 1 mg, M.V.I. Adult 10 mL infusion   Intravenous Once Stechschulte, Hyman Hopes, MD       sodium chloride 0.9 % 1,000 mL with thiamine 100 mg, folic acid 1 mg, M.V.I. Adult 10 mL infusion   Intravenous Once Stechschulte, Hyman Hopes, MD       sodium chloride 0.9 % bolus 1,000 mL  1,000 mL Intravenous Once Stechschulte, Hyman Hopes, MD       sodium chloride 0.9 % bolus 1,000 mL  1,000 mL Intravenous Once Stechschulte, Hyman Hopes, MD        Allergies  Allergen Reactions   Tomato Hives, Itching and Rash    ROS    See HPI Objective:    Physical Exam  There were no vitals taken for this visit. Wt Readings from Last 3 Encounters:  10/28/23 267 lb (121.1 kg)  08/25/23 271 lb (122.9 kg)  07/14/23 279 lb (126.6 kg)    Gen: Awake, alert, no acute distress Resp: Breathing is even and non-labored Psych: calm/pleasant demeanor Neuro: Alert and Oriented x 3, + facial symmetry, speech is clear.     Assessment & Plan:   Problem List Items Addressed This Visit       Unprioritized   Multiple gastric ulcers    Recent endoscopy showed healing ulcers. Patient reports ongoing symptoms and concern for additional undiagnosed issues. -Follow-up with surgeon on 11/19/2023 for further discussion.       Morbid obesity with BMI of 50.0-59.9, adult (HCC)    Difficulty with adequate protein intake due to gastric discomfort. Recent weight loss to 268 lbs. -Continue follow-up with nutritionist, focusing on protein intake and symptom management rather than weight  loss at this time.       Depression    Patient reports worsening symptoms and desire to return to therapy. -Encourage patient to schedule appointment with a therapist. -Continue Lexapro 20mg  daily. -Consider medication adjustment if no improvement after initiation of therapy.      Bilateral shoulder pain - Primary    Chronic, worsening over the past few weeks. No recent imaging or physical therapy. -Refer to sports medicine for further evaluation and management.       Relevant Orders   Ambulatory referral to Sports Medicine    I am having Saphira Greenwalt maintain her multivitamin with minerals, CALCIUM-VITAMIN D PO, famotidine, hydrOXYzine, lisinopril, sucralfate, esomeprazole, HYDROcodone-acetaminophen, Blood Glucose Monitoring Suppl, BLOOD GLUCOSE TEST STRIPS, rosuvastatin, escitalopram, gabapentin, ondansetron, dapagliflozin propanediol, metFORMIN, insulin lispro, Trulicity, Januvia, Accu-Chek Softclix Lancets, naloxone, and Dexcom G7 Sensor.  No orders of the defined types were placed in this encounter.   I discussed the assessment and treatment plan with the patient. The patient was provided an opportunity to ask questions and all were answered. The patient agreed with the plan and demonstrated an understanding of the instructions.   The patient was advised to call back or seek an in-person evaluation if the symptoms  worsen or if the condition fails to improve as anticipated.    Lemont Fillers, NP [facility] [phone] (phone) [phone] (fax)  [facility]

## 2023-11-18 ENCOUNTER — Other Ambulatory Visit: Payer: Self-pay | Admitting: Family

## 2023-11-18 ENCOUNTER — Other Ambulatory Visit (HOSPITAL_BASED_OUTPATIENT_CLINIC_OR_DEPARTMENT_OTHER): Payer: Self-pay

## 2023-11-20 ENCOUNTER — Ambulatory Visit (HOSPITAL_BASED_OUTPATIENT_CLINIC_OR_DEPARTMENT_OTHER)
Admission: RE | Admit: 2023-11-20 | Discharge: 2023-11-20 | Disposition: A | Discharge status: Home / Self Care | Payer: Medicaid Other | Source: Ambulatory Visit | Attending: Family Medicine | Admitting: Family Medicine

## 2023-11-20 ENCOUNTER — Ambulatory Visit (INDEPENDENT_AMBULATORY_CARE_PROVIDER_SITE_OTHER): Payer: Medicaid Other | Admitting: Family Medicine

## 2023-11-20 ENCOUNTER — Encounter: Payer: Self-pay | Admitting: Family Medicine

## 2023-11-20 VITALS — BP 142/88 | Ht 63.0 in | Wt 267.0 lb

## 2023-11-20 DIAGNOSIS — G8929 Other chronic pain: Secondary | ICD-10-CM | POA: Diagnosis present

## 2023-11-20 DIAGNOSIS — M25512 Pain in left shoulder: Secondary | ICD-10-CM

## 2023-11-20 DIAGNOSIS — M25511 Pain in right shoulder: Secondary | ICD-10-CM | POA: Insufficient documentation

## 2023-11-20 MED ORDER — METHOCARBAMOL 500 MG PO TABS: 500.0000 mg | ORAL_TABLET | Freq: Four times a day (QID) | ORAL | 0 refills | Status: AC | PRN

## 2023-11-20 NOTE — Progress Notes (Signed)
CHIEF COMPLAINT: No chief complaint on file.  _____________________________________________________________ SUBJECTIVE  HPI  Pt is a 48 y.o. female here for evaluation of bilateral shoulder pain  Seen by PCP via virtual visit 11/17/2023, reporting bilateral shoulder pain ongoing for few weeks  Ongoing for cumulatively since 01/2023  Acute on chronic 4 weeks  End of December was having pain more consistently Inciting event: none Primarily located: armpit, goes all the way around, + posterior shoulder, described as a throbbing. Also with anterior shoulder pain Radiating: localized to shoulder Numbness/tingling: none Exacerbated by: reaching behind her, overhead movements Therapies tried so far: biofreeze, unable to use NSAIDs due to surgical hx, tylenol; pain is not going away  Prior documentation in 2022 where patient reported pain and reflux symptoms with associated shoulder blade pain Reported b/l shoulder pain 01/2023 when seen by rheumatology, counseled on conservative therapies and plans to refer to PT  MHx includes +ANA, follows with rheumatology; chronic GERD status post Roux-en-Y gastric bypass and hiatal hernia repair December 2023  Reports she's on medical disability Has been evaluated/seen by rheumatologist 01/2023 Last A1c 9.0 (09/26/2023), creatinine 09/26/2023 0.67, denies hospitalizatoin for DKA  ------------------------------------------------------------------------------------------------------ Past Medical History:  Diagnosis Date   Anemia    Anxiety    Astigmatism    Depression    Diabetes mellitus without complication (HCC)    Dry mouth    Fatigue    GERD (gastroesophageal reflux disease)    Heartburn    Hepatic steatosis    Hepatomegaly    High cholesterol    Hypertension    Keratoconjunctivitis    Liver disease    Muscle pain    Nausea and vomiting 04/21/2023   Ovarian cyst    Positive ANA (antinuclear antibody)    Pulmonary nodules    Vitamin D  deficiency    Weakness     Past Surgical History:  Procedure Laterality Date   BIOPSY  01/18/2021   Procedure: BIOPSY;  Surgeon: Shellia Cleverly, DO;  Location: [facility];  Service: Gastroenterology;;   BIOPSY  10/28/2023   Procedure: BIOPSY;  Surgeon: Quentin Ore, MD;  Location: [facility];  Service: General;;   CHOLECYSTECTOMY N/A 05/11/2021   Procedure: LAPAROSCOPIC CHOLECYSTECTOMY WITH INTRAOPERATIVE CHOLANGIOGRAM;  Surgeon: Quentin Ore, MD;  Location: [facility];  Service: General;  Laterality: N/A;   DILATION AND CURETTAGE OF UTERUS     ENDOMETRIAL BIOPSY  12/19/2017   normal per pt   ESOPHAGOGASTRODUODENOSCOPY N/A 10/28/2023   Procedure: UPPER ENDOSCOPY;  Surgeon: Quentin Ore, MD;  Location: [facility];  Service: General;  Laterality: N/A;   ESOPHAGOGASTRODUODENOSCOPY (EGD) WITH PROPOFOL N/A 01/18/2021   Procedure: ESOPHAGOGASTRODUODENOSCOPY (EGD) WITH PROPOFOL;  Surgeon: Shellia Cleverly, DO;  Location: [facility];  Service: Gastroenterology;  Laterality: N/A;   HIATAL HERNIA REPAIR N/A 09/30/2022   Procedure: HERNIA REPAIR HIATAL;  Surgeon: Quentin Ore, MD;  Location: [facility];  Service: General;  Laterality: N/A;   LAPAROSCOPIC INSERTION GASTROSTOMY TUBE N/A 12/02/2022   Procedure: LAPAROSCOPIC REMNANT GASTROSTOMY G TUBE;  Surgeon: Quentin Ore, MD;  Location: [facility];  Service: General;  Laterality: N/A;   LAPAROSCOPIC ROUX-EN-Y GASTRIC BYPASS WITH HIATAL HERNIA REPAIR  09/30/2022   LAPAROSCOPY N/A 12/02/2022   Procedure: LAPAROSCOPY DIAGNOSTIC;  Surgeon: Quentin Ore, MD;  Location: [facility];  Service: General;  Laterality: N/A;   TONSILLECTOMY     TUBAL LIGATION     UPPER GI ENDOSCOPY N/A 09/30/2022   Procedure: UPPER GI ENDOSCOPY;  Surgeon:  Stechschulte, Hyman Hopes, MD;  Location: [facility];  Service: General;  Laterality: N/A;   UPPER GI ENDOSCOPY N/A 12/02/2022   Procedure: UPPER ENDOSCOPY;  Surgeon: Quentin Ore, MD;   Location: [facility];  Service: General;  Laterality: N/A;      Outpatient Encounter Medications as of 11/20/2023  Medication Sig   Accu-Chek Softclix Lancets lancets 4 (four) times daily.   Blood Glucose Monitoring Suppl DEVI 1 each by Does not apply route in the morning, at noon, and at bedtime. May substitute to any manufacturer covered by patient's insurance.   CALCIUM-VITAMIN D PO Take 2 tablets by mouth every evening.   Continuous Glucose Sensor (DEXCOM G7 SENSOR) MISC Use to check glucose continuously, change sensor every 10 days   dapagliflozin propanediol (FARXIGA) 10 MG TABS tablet Take 1 tablet (10 mg total) by mouth daily before breakfast.   Dulaglutide (TRULICITY) 0.75 MG/0.5ML SOAJ Inject 0.75 mg into the skin once a week.   escitalopram (LEXAPRO) 20 MG tablet Take 1 tablet (20 mg total) by mouth daily.   esomeprazole (NEXIUM) 40 MG capsule Take 1 capsule (40 mg total) by mouth 2 (two) times daily. Open capsule and sprinkle on food at breakfast and dinner for better absorption.   famotidine (PEPCID) 20 MG tablet TAKE 1 TABLET(20 MG) BY MOUTH TWICE DAILY   gabapentin (NEURONTIN) 300 MG capsule Take 1 capsule (300 mg total) by mouth 3 (three) times daily.   Glucose Blood (BLOOD GLUCOSE TEST STRIPS) STRP 1 each by In Vitro route in the morning, at noon, and at bedtime. May substitute to any manufacturer covered by patient's insurance.   HYDROcodone-acetaminophen (NORCO) 10-325 MG tablet Take by mouth.   hydrOXYzine (ATARAX) 25 MG tablet TAKE 2 TABLETS BY MOUTH AT BEDTIME AS NEEDED FOR INSOMNIA   insulin lispro (HUMALOG KWIKPEN) 100 UNIT/ML KwikPen Inject 3-7 Units into the skin 3 (three) times daily. 3 units if blood sugar is more than 200, 5 units if blood sugar is more than 250, 7 units if blood sugar is more than 300   JANUVIA 100 MG tablet Take 100 mg by mouth daily.   lisinopril (ZESTRIL) 5 MG tablet Take 1 tablet (5 mg total) by mouth daily.   metFORMIN (GLUCOPHAGE-XR) 500 MG 24 hr  tablet Take 2 tablets (1,000 mg total) by mouth 2 (two) times daily with a meal.   Multiple Vitamin (MULTIVITAMIN WITH MINERALS) TABS tablet Take 2 tablets by mouth every evening.   naloxone (NARCAN) nasal spray 4 mg/0.1 mL SMARTSIG:Both Nares   ondansetron (ZOFRAN-ODT) 4 MG disintegrating tablet DISSOLVE 1 TABLET(4 MG) ON THE TONGUE EVERY 8 HOURS AS NEEDED FOR NAUSEA OR VOMITING   rosuvastatin (CRESTOR) 5 MG tablet TAKE 1 TABLET(5 MG) BY MOUTH DAILY   sucralfate (CARAFATE) 1 GM/10ML suspension Take 10 mLs (1 g total) by mouth 4 (four) times daily.   [DISCONTINUED] traZODone (DESYREL) 50 MG tablet 1/2 to 1 tab by mouth at bedtime as needed for sleep   Facility-Administered Encounter Medications as of 11/20/2023  Medication   sodium chloride 0.9 % 1,000 mL with thiamine 100 mg, folic acid 1 mg, M.V.I. Adult 10 mL infusion   sodium chloride 0.9 % 1,000 mL with thiamine 100 mg, folic acid 1 mg, M.V.I. Adult 10 mL infusion   sodium chloride 0.9 % bolus 1,000 mL   sodium chloride 0.9 % bolus 1,000 mL    ------------------------------------------------------------------------------------------------------  _____________________________________________________________ OBJECTIVE  PHYSICAL EXAM  Today's Vitals   11/20/23 0858  BP: [name])  142/88  Weight: 267 lb (121.1 kg)  Height: 5\' 3"  (1.6 m)   Body mass index is 47.3 kg/m.   reviewed  General: A+Ox3, no acute distress, well-nourished, appropriate affect CV: pulses 2+ regular, nondiaphoretic, no peripheral edema, cap refill <2sec Lungs: no audible wheezing, non-labored breathing, bilateral chest rise/fall, nontachypneic Skin: warm, well-perfused, non-icteric, no susp lesions or rashes Neuro:  Sensation intact, muscle tone wnl, no atrophy Psych: no signs of depression or anxiety MSK:   Shoulder exam:  No deformity, swelling or muscle wasting No scapular winging, R sided dyskinesis FF 160 (pain at 150 b/l), abducts to 120 (pain at 100  b/l) TTP right AC joint, bilateral posterior shoulder capsule, bilateral trap (not as severe on the left side), subacromial space NTTP over the [place], clavicle, coracoid, biceps groove, deltoid, scap spine, cervical spine + neer b/l, + hawkins b/l, + empty can bilateral, + scarf test right, - hornblower, - subscap belly press, however provocative of posterior pain bilaterally, + speeds bilateral also provoking posterior/trapezius pain on the left side, + obriens right side, + Yergason right side.  Resisted internal and external rotation weaker on the right side; left-sided strength 3/5 Neg ant drawer, sulcus sign Neg apprehension Negative Spurling's test bilat FROM of neck  _____________________________________________________________ ASSESSMENT/PLAN Diagnoses and all orders for this visit:  Chronic pain of both shoulders -     DG Shoulder Left; Future -     DG Shoulder Right; Future  Other orders -     methocarbamol (ROBAXIN) 500 MG tablet; Take 1 tablet (500 mg total) by mouth every 6 (six) hours as needed for up to 20 days for muscle spasms.   Exam findings suggestive of b/l RTC dysfunction/tendinosis, R ACJ involvement, concomitant b/l trapezius muscle spasms. Unable to take NSAIDs due to history of gastric bypass, medications include gabapentin, Norco, Tylenol.  Discussed options for management. Expresses reluctance in corticosteroids due to surgical history and recent identification of multiple GI ulcers. Interested in obtaining XR for further review, which were ordered today. Will proceed with trial robaxin first, reviewed frequency of heat/ice compresses, massage, stretches. Plan for follow-up next week to review films, sooner as needed, would not be unreasonable to consider SAB CSI at that time should Ferne be interested in this procedure. Ultimately would benefit from HEP/PT. All questions answered. Return precautions discussed. Patient verbalized understanding and is in agreement with  plan  Electronically signed by: Burna Forts, MD 11/20/2023 9:09 AM

## 2023-11-24 ENCOUNTER — Telehealth: Payer: Self-pay

## 2023-11-24 NOTE — Telephone Encounter (Signed)
PA request has been Submitted. New Encounter created for follow up. For additional info see Pharmacy Prior Auth telephone encounter from 02/03.

## 2023-11-24 NOTE — Telephone Encounter (Signed)
*  Endo  Pharmacy Patient Advocate Encounter   Received notification from Patient Advice Request messages that prior authorization for Dexcom G7 Sensor  is required/requested.   Insurance verification completed.   The patient is insured through [facility] .   Per test claim: PA required; PA started via CoverMyMeds. KEY [id number] . Please see clinical question(s) below that I am not finding the answer to in her chart and advise.   What insulin is patient currently taking? Humalog order was discontinued with no new order taking its place. Is patient to continue with only Trulicity?

## 2023-11-27 ENCOUNTER — Encounter: Payer: Self-pay | Admitting: Family Medicine

## 2023-11-27 ENCOUNTER — Ambulatory Visit (INDEPENDENT_AMBULATORY_CARE_PROVIDER_SITE_OTHER): Payer: Medicaid Other | Admitting: Family Medicine

## 2023-11-27 VITALS — BP 142/100 | Ht 63.0 in | Wt 267.0 lb

## 2023-11-27 DIAGNOSIS — G8929 Other chronic pain: Secondary | ICD-10-CM

## 2023-11-27 DIAGNOSIS — M7531 Calcific tendinitis of right shoulder: Secondary | ICD-10-CM | POA: Diagnosis not present

## 2023-11-27 DIAGNOSIS — M25512 Pain in left shoulder: Secondary | ICD-10-CM

## 2023-11-27 DIAGNOSIS — M25511 Pain in right shoulder: Secondary | ICD-10-CM

## 2023-11-27 MED ORDER — TRIAMCINOLONE ACETONIDE 40 MG/ML IJ SUSP
40.0000 mg | Freq: Once | INTRAMUSCULAR | Status: AC
  Administered 2023-11-27: 40 mg via INTRA_ARTICULAR

## 2023-11-27 NOTE — Progress Notes (Signed)
 CHIEF COMPLAINT: No chief complaint on file.  _____________________________________________________________ SUBJECTIVE  HPI  Pt is a 48 y.o. female here for Follow-up of bilateral shoulder pain, chronic  Ongoing for cumulatively since 01/2023             Acute on chronic 5 weeks             End of December was having pain more consistently Inciting event: none Primarily located: armpit, goes all the way around, + posterior shoulder, described as a throbbing. Also with anterior shoulder pain Radiating: localized to shoulder Numbness/tingling: none Exacerbated by: reaching behind her, overhead movements   Seen 11/20/2023, trial Robaxin , x-rays ordered -Right shoulder x-ray demonstrating finding suspicious for 9mm humeral calcific tendinopathy, also with mild AC joint degenerative changes, no acute fracture noted -Left shoulder x-ray demonstrating mild AC joint degenerative changes, preserved jt spacing, degenerative changes of the humeral head tuberosities, no acute fracture noted  Symptoms last visit, her pain has been improved on the R shoulder, thinks the muscle relaxant is helping  Infrequent use, states she does not use the full dose Still hurts when she wakes up Has been using heat to help with the severity Interested in SAB CSI of the R shoulder today, has not had this done before, would like to trial one side before proceeding with the next Therapies tried so far: biofreeze, unable to use NSAIDs due to surgical hx, tylenol , improvement with robaxin   Reported b/l shoulder pain 01/2023 when seen by rheumatology, counseled on conservative therapies and plans to refer to PT MHx includes +ANA, follows with rheumatology; chronic GERD status post Roux-en-Y gastric bypass and hiatal hernia repair December 2023 Reports she's on medical disability Has been evaluated/seen by rheumatologist 01/2023 Last A1c 9.0 (09/26/2023), creatinine 09/26/2023 0.67, denies hospitalizatoin for  DKA  ------------------------------------------------------------------------------------------------------ Past Medical History:  Diagnosis Date   Anemia    Anxiety    Astigmatism    Depression    Diabetes mellitus without complication (HCC)    Dry mouth    Fatigue    GERD (gastroesophageal reflux disease)    Heartburn    Hepatic steatosis    Hepatomegaly    High cholesterol    Hypertension    Keratoconjunctivitis    Liver disease    Muscle pain    Nausea and vomiting 04/21/2023   Ovarian cyst    Positive ANA (antinuclear antibody)    Pulmonary nodules    Vitamin D  deficiency    Weakness     Past Surgical History:  Procedure Laterality Date   BIOPSY  01/18/2021   Procedure: BIOPSY;  Surgeon: San Sandor GAILS, DO;  Location: [facility];  Service: Gastroenterology;;   BIOPSY  10/28/2023   Procedure: BIOPSY;  Surgeon: Lyndel Deward PARAS, MD;  Location: [facility];  Service: General;;   CHOLECYSTECTOMY N/A 05/11/2021   Procedure: LAPAROSCOPIC CHOLECYSTECTOMY WITH INTRAOPERATIVE CHOLANGIOGRAM;  Surgeon: Lyndel Deward PARAS, MD;  Location: [facility];  Service: General;  Laterality: N/A;   DILATION AND CURETTAGE OF UTERUS     ENDOMETRIAL BIOPSY  12/19/2017   normal per pt   ESOPHAGOGASTRODUODENOSCOPY N/A 10/28/2023   Procedure: UPPER ENDOSCOPY;  Surgeon: Lyndel Deward PARAS, MD;  Location: [facility];  Service: General;  Laterality: N/A;   ESOPHAGOGASTRODUODENOSCOPY (EGD) WITH PROPOFOL  N/A 01/18/2021   Procedure: ESOPHAGOGASTRODUODENOSCOPY (EGD) WITH PROPOFOL ;  Surgeon: San Sandor GAILS, DO;  Location: [facility];  Service: Gastroenterology;  Laterality: N/A;   HIATAL HERNIA REPAIR N/A 09/30/2022   Procedure: HERNIA REPAIR HIATAL;  Surgeon: Lyndel Deward PARAS, MD;  Location: [facility];  Service: General;  Laterality: N/A;   LAPAROSCOPIC INSERTION GASTROSTOMY TUBE N/A 12/02/2022   Procedure: LAPAROSCOPIC REMNANT GASTROSTOMY G TUBE;  Surgeon: Lyndel Deward PARAS, MD;   Location: [facility];  Service: General;  Laterality: N/A;   LAPAROSCOPIC ROUX-EN-Y GASTRIC BYPASS WITH HIATAL HERNIA REPAIR  09/30/2022   LAPAROSCOPY N/A 12/02/2022   Procedure: LAPAROSCOPY DIAGNOSTIC;  Surgeon: Lyndel Deward PARAS, MD;  Location: [facility];  Service: General;  Laterality: N/A;   TONSILLECTOMY     TUBAL LIGATION     UPPER GI ENDOSCOPY N/A 09/30/2022   Procedure: UPPER GI ENDOSCOPY;  Surgeon: Lyndel Deward PARAS, MD;  Location: [facility];  Service: General;  Laterality: N/A;   UPPER GI ENDOSCOPY N/A 12/02/2022   Procedure: UPPER ENDOSCOPY;  Surgeon: Lyndel Deward PARAS, MD;  Location: [facility];  Service: General;  Laterality: N/A;     Outpatient Encounter Medications as of 11/27/2023  Medication Sig   Accu-Chek Softclix Lancets lancets 4 (four) times daily.   Blood Glucose Monitoring Suppl DEVI 1 each by Does not apply route in the morning, at noon, and at bedtime. May substitute to any manufacturer covered by patient's insurance.   CALCIUM -VITAMIN D  PO Take 2 tablets by mouth every evening.   Continuous Glucose Sensor (DEXCOM G7 SENSOR) MISC Use to check glucose continuously, change sensor every 10 days   dapagliflozin  propanediol (FARXIGA ) 10 MG TABS tablet Take 1 tablet (10 mg total) by mouth daily before breakfast.   Dulaglutide  (TRULICITY ) 0.75 MG/0.5ML SOAJ Inject 0.75 mg into the skin once a week.   escitalopram  (LEXAPRO ) 20 MG tablet Take 1 tablet (20 mg total) by mouth daily.   esomeprazole  (NEXIUM ) 40 MG capsule Take 1 capsule (40 mg total) by mouth 2 (two) times daily. Open capsule and sprinkle on food at breakfast and dinner for better absorption.   famotidine  (PEPCID ) 20 MG tablet TAKE 1 TABLET(20 MG) BY MOUTH TWICE DAILY   gabapentin  (NEURONTIN ) 300 MG capsule Take 1 capsule (300 mg total) by mouth 3 (three) times daily.   Glucose Blood (BLOOD GLUCOSE TEST STRIPS) STRP 1 each by In Vitro route in the morning, at noon, and at bedtime. May substitute to any manufacturer  covered by patient's insurance.   HYDROcodone-acetaminophen  (NORCO) 10-325 MG tablet Take by mouth.   hydrOXYzine  (ATARAX ) 25 MG tablet TAKE 2 TABLETS BY MOUTH AT BEDTIME AS NEEDED FOR INSOMNIA   insulin  lispro (HUMALOG  KWIKPEN) 100 UNIT/ML KwikPen Inject 3-7 Units into the skin 3 (three) times daily. 3 units if blood sugar is more than 200, 5 units if blood sugar is more than 250, 7 units if blood sugar is more than 300   JANUVIA  100 MG tablet Take 100 mg by mouth daily.   lisinopril  (ZESTRIL ) 5 MG tablet Take 1 tablet (5 mg total) by mouth daily.   metFORMIN  (GLUCOPHAGE -XR) 500 MG 24 hr tablet Take 2 tablets (1,000 mg total) by mouth 2 (two) times daily with a meal.   methocarbamol  (ROBAXIN ) 500 MG tablet Take 1 tablet (500 mg total) by mouth every 6 (six) hours as needed for up to 20 days for muscle spasms.   Multiple Vitamin (MULTIVITAMIN WITH MINERALS) TABS tablet Take 2 tablets by mouth every evening.   naloxone (NARCAN) nasal spray 4 mg/0.1 mL SMARTSIG:Both Nares   ondansetron  (ZOFRAN -ODT) 4 MG disintegrating tablet DISSOLVE 1 TABLET(4 MG) ON THE TONGUE EVERY 8 HOURS AS NEEDED FOR NAUSEA OR VOMITING   rosuvastatin  (CRESTOR )  5 MG tablet TAKE 1 TABLET(5 MG) BY MOUTH DAILY   sucralfate  (CARAFATE ) 1 GM/10ML suspension Take 10 mLs (1 g total) by mouth 4 (four) times daily.   [DISCONTINUED] traZODone  (DESYREL ) 50 MG tablet 1/2 to 1 tab by mouth at bedtime as needed for sleep   Facility-Administered Encounter Medications as of 11/27/2023  Medication   sodium chloride  0.9 % 1,000 mL with thiamine  100 mg, folic acid  1 mg, M.V.I. Adult 10 mL infusion   sodium chloride  0.9 % 1,000 mL with thiamine  100 mg, folic acid  1 mg, M.V.I. Adult 10 mL infusion   sodium chloride  0.9 % bolus 1,000 mL   sodium chloride  0.9 % bolus 1,000 mL     ------------------------------------------------------------------------------------------------------  _____________________________________________________________ OBJECTIVE  PHYSICAL EXAM  Today's Vitals   11/27/23 0906 11/27/23 0954  BP: (!) 146/100 (!) 142/100  Weight: 267 lb (121.1 kg)   Height: 5' 3 (1.6 m)   PainSc: 3    PainLoc: Shoulder    Body mass index is 47.3 kg/m.   reviewed  General: A+Ox3, no acute distress, well-nourished, appropriate affect CV: pulses 2+ regular, nondiaphoretic, no peripheral edema, cap refill <2sec Lungs: no audible wheezing, non-labored breathing, bilateral chest rise/fall, nontachypneic Skin: warm, well-perfused, non-icteric, no susp lesions or rashes Neuro:  Sensation intact, muscle tone wnl, no atrophy Psych: no signs of depression or anxiety MSK:  Shoulder Exam:  No deformity, swelling or muscle wasting No scapular winging FF 180, abd 180, int 0, ext 90  Right: TTP subacromial space, ACJ, improved trapezius pain, lateral humerus, pain severity diminished from prior exam NTTP over the [place], clavicle, coracoid, biceps groove, scap spine, cervical spine + neer, +hawkins, +empty can, -scarf test, +hornblower, -subscap liftoff, +speeds, -obriens, +yergason's, pain with resisted ER  Left: TTP subacromial space, improved trapezius pain - Neer, - hawkins, - empty can, scarf, obrien, - hornblower, - subscap, - speeds, - obriens, - yergason, pain with resisted ER  _____________________________________________________________ ASSESSMENT/PLAN Diagnoses and all orders for this visit:  Chronic right shoulder pain -     Ambulatory referral to Physical Therapy -     triamcinolone  acetonide (KENALOG -40) injection 40 mg  Chronic left shoulder pain  Calcific tendinitis of right shoulder   XRs reviewed with patient. R calcific tendinopathy, mild bilateral degenerative changes noted. Signs of impingement, RTC overuse/tendinosis on exam.  Options discussed for management. PT referral sent. Would like to proceed with SAB CSI today  Subacromial Bursa Injection Site: right  After PARQ discussed and verbal consent given, the site was cleaned with alcohol and chlorhexidine . Steroid injection was performed using sterile technique with 4mL 1% lidocaine , 1mL 40mg /mL kenalog  and 25G 1.5in needle. This was well tolerated and resulted in marked relief. Dressing placed and post injection instructions were given including a discussion of likely return of pain today after the anesthetic wears off (with the possibility of worsened pain) until the steroid starts to work in 1-3 days. Call or return to clinic prn if these symptoms worsen or fail to improve as anticipated.  May continue with otc therapies, robaxin  Rx, states refill not needed. Discussed that ESWT may be considered for calcific tendinopathy/refractory symptoms, services available at our [place] office. Anticipate f/u PRN if she is interested in L shoulder CSI. Return PRN 4-6 wks for refractory, increasing, or new concerns despite PT, sooner as needed, would consider POCUS for further evaluation, advanced imaging as appropriate. All questions answered. Return precautions discussed. Patient verbalized understanding and is in agreement with plan  Electronically  signed by: Benedict LELON Bumps, MD 11/27/2023 7:28 AM

## 2023-12-01 ENCOUNTER — Ambulatory Visit: Payer: Medicaid Other | Admitting: Family Medicine

## 2023-12-01 NOTE — Progress Notes (Deleted)
 CHIEF COMPLAINT: No chief complaint on file.  _____________________________________________________________ SUBJECTIVE  HPI  Pt is a 48 y.o. female here for follow-up of shoulder pain  Underwent SAB CSI R shoulder last week with no side effects, tolerated well. Is having *** improvement. Is interested in having CSI of the contralateral shoulder  Ongoing for *** Inciting event: Primarily located   Radiating Numbness/tingling Catching/locking *** Exacerbated by Therapies tried so far:  Works as Psychiatric nurse***   ------------------------------------------------------------------------------------------------------ Past Medical History:  Diagnosis Date   Anemia    Anxiety    Astigmatism    Depression    Diabetes mellitus without complication (HCC)    Dry mouth    Fatigue    GERD (gastroesophageal reflux disease)    Heartburn    Hepatic steatosis    Hepatomegaly    High cholesterol    Hypertension    Keratoconjunctivitis    Liver disease    Muscle pain    Nausea and vomiting 04/21/2023   Ovarian cyst    Positive ANA (antinuclear antibody)    Pulmonary nodules    Vitamin D deficiency    Weakness     Past Surgical History:  Procedure Laterality Date   BIOPSY  01/18/2021   Procedure: BIOPSY;  Surgeon: Shellia Cleverly, DO;  Location: [facility];  Service: Gastroenterology;;   BIOPSY  10/28/2023   Procedure: BIOPSY;  Surgeon: Quentin Ore, MD;  Location: [facility];  Service: General;;   CHOLECYSTECTOMY N/A 05/11/2021   Procedure: LAPAROSCOPIC CHOLECYSTECTOMY WITH INTRAOPERATIVE CHOLANGIOGRAM;  Surgeon: Quentin Ore, MD;  Location: [facility];  Service: General;  Laterality: N/A;   DILATION AND CURETTAGE OF UTERUS     ENDOMETRIAL BIOPSY  12/19/2017   normal per pt   ESOPHAGOGASTRODUODENOSCOPY N/A 10/28/2023   Procedure: UPPER ENDOSCOPY;  Surgeon: Quentin Ore, MD;  Location: [facility];  Service: General;  Laterality: N/A;    ESOPHAGOGASTRODUODENOSCOPY (EGD) WITH PROPOFOL N/A 01/18/2021   Procedure: ESOPHAGOGASTRODUODENOSCOPY (EGD) WITH PROPOFOL;  Surgeon: Shellia Cleverly, DO;  Location: [facility];  Service: Gastroenterology;  Laterality: N/A;   HIATAL HERNIA REPAIR N/A 09/30/2022   Procedure: HERNIA REPAIR HIATAL;  Surgeon: Quentin Ore, MD;  Location: [facility];  Service: General;  Laterality: N/A;   LAPAROSCOPIC INSERTION GASTROSTOMY TUBE N/A 12/02/2022   Procedure: LAPAROSCOPIC REMNANT GASTROSTOMY G TUBE;  Surgeon: Quentin Ore, MD;  Location: [facility];  Service: General;  Laterality: N/A;   LAPAROSCOPIC ROUX-EN-Y GASTRIC BYPASS WITH HIATAL HERNIA REPAIR  09/30/2022   LAPAROSCOPY N/A 12/02/2022   Procedure: LAPAROSCOPY DIAGNOSTIC;  Surgeon: Quentin Ore, MD;  Location: [facility];  Service: General;  Laterality: N/A;   TONSILLECTOMY     TUBAL LIGATION     UPPER GI ENDOSCOPY N/A 09/30/2022   Procedure: UPPER GI ENDOSCOPY;  Surgeon: Quentin Ore, MD;  Location: [facility];  Service: General;  Laterality: N/A;   UPPER GI ENDOSCOPY N/A 12/02/2022   Procedure: UPPER ENDOSCOPY;  Surgeon: Quentin Ore, MD;  Location: [facility];  Service: General;  Laterality: N/A;      Outpatient Encounter Medications as of 12/01/2023  Medication Sig   Accu-Chek Softclix Lancets lancets 4 (four) times daily.   Blood Glucose Monitoring Suppl DEVI 1 each by Does not apply route in the morning, at noon, and at bedtime. May substitute to any manufacturer covered by patient's insurance.   CALCIUM-VITAMIN D PO Take 2 tablets by mouth every evening.   Continuous Glucose Sensor (DEXCOM G7 SENSOR) MISC Use to  check glucose continuously, change sensor every 10 days   dapagliflozin propanediol (FARXIGA) 10 MG TABS tablet Take 1 tablet (10 mg total) by mouth daily before breakfast.   Dulaglutide (TRULICITY) 0.75 MG/0.5ML SOAJ Inject 0.75 mg into the skin once a week.   escitalopram (LEXAPRO) 20 MG tablet Take 1 tablet  (20 mg total) by mouth daily.   esomeprazole (NEXIUM) 40 MG capsule Take 1 capsule (40 mg total) by mouth 2 (two) times daily. Open capsule and sprinkle on food at breakfast and dinner for better absorption.   famotidine (PEPCID) 20 MG tablet TAKE 1 TABLET(20 MG) BY MOUTH TWICE DAILY   gabapentin (NEURONTIN) 300 MG capsule Take 1 capsule (300 mg total) by mouth 3 (three) times daily.   HYDROcodone-acetaminophen (NORCO) 10-325 MG tablet Take by mouth.   hydrOXYzine (ATARAX) 25 MG tablet TAKE 2 TABLETS BY MOUTH AT BEDTIME AS NEEDED FOR INSOMNIA   insulin lispro (HUMALOG KWIKPEN) 100 UNIT/ML KwikPen Inject 3-7 Units into the skin 3 (three) times daily. 3 units if blood sugar is more than 200, 5 units if blood sugar is more than 250, 7 units if blood sugar is more than 300   JANUVIA 100 MG tablet Take 100 mg by mouth daily.   lisinopril (ZESTRIL) 5 MG tablet Take 1 tablet (5 mg total) by mouth daily.   metFORMIN (GLUCOPHAGE-XR) 500 MG 24 hr tablet Take 2 tablets (1,000 mg total) by mouth 2 (two) times daily with a meal.   methocarbamol (ROBAXIN) 500 MG tablet Take 1 tablet (500 mg total) by mouth every 6 (six) hours as needed for up to 20 days for muscle spasms.   Multiple Vitamin (MULTIVITAMIN WITH MINERALS) TABS tablet Take 2 tablets by mouth every evening.   naloxone (NARCAN) nasal spray 4 mg/0.1 mL SMARTSIG:Both Nares   ondansetron (ZOFRAN-ODT) 4 MG disintegrating tablet DISSOLVE 1 TABLET(4 MG) ON THE TONGUE EVERY 8 HOURS AS NEEDED FOR NAUSEA OR VOMITING   rosuvastatin (CRESTOR) 5 MG tablet TAKE 1 TABLET(5 MG) BY MOUTH DAILY   sucralfate (CARAFATE) 1 GM/10ML suspension Take 10 mLs (1 g total) by mouth 4 (four) times daily.   [DISCONTINUED] traZODone (DESYREL) 50 MG tablet 1/2 to 1 tab by mouth at bedtime as needed for sleep   Facility-Administered Encounter Medications as of 12/01/2023  Medication   sodium chloride 0.9 % 1,000 mL with thiamine 100 mg, folic acid 1 mg, M.V.I. Adult 10 mL infusion    sodium chloride 0.9 % 1,000 mL with thiamine 100 mg, folic acid 1 mg, M.V.I. Adult 10 mL infusion   sodium chloride 0.9 % bolus 1,000 mL   sodium chloride 0.9 % bolus 1,000 mL    ------------------------------------------------------------------------------------------------------  _____________________________________________________________ OBJECTIVE  PHYSICAL EXAM  There were no vitals filed for this visit. There is no height or weight on file to calculate BMI.   reviewed  General: A+Ox3, no acute distress, well-nourished, appropriate affect CV: pulses 2+ regular, nondiaphoretic, no peripheral edema, cap refill <2sec Lungs: no audible wheezing, non-labored breathing, bilateral chest rise/fall, nontachypneic Skin: warm, well-perfused, non-icteric, no susp lesions or rashes Neuro: *** Sensation intact, muscle tone wnl, no atrophy Psych: no signs of depression or anxiety MSK: ***      _____________________________________________________________ ASSESSMENT/PLAN There are no diagnoses linked to this encounter.      Electronically signed by: Burna Forts, MD 12/01/2023 7:40 AM

## 2023-12-03 ENCOUNTER — Encounter: Payer: Self-pay | Admitting: Family

## 2023-12-03 DIAGNOSIS — G8929 Other chronic pain: Secondary | ICD-10-CM

## 2023-12-04 ENCOUNTER — Encounter: Payer: Self-pay | Admitting: Family Medicine

## 2023-12-06 ENCOUNTER — Other Ambulatory Visit: Payer: Self-pay | Admitting: "Endocrinology

## 2023-12-08 ENCOUNTER — Encounter: Payer: Self-pay | Admitting: "Endocrinology

## 2023-12-08 ENCOUNTER — Other Ambulatory Visit: Payer: Self-pay | Admitting: Family

## 2023-12-08 DIAGNOSIS — Z1231 Encounter for screening mammogram for malignant neoplasm of breast: Secondary | ICD-10-CM

## 2023-12-09 ENCOUNTER — Encounter (HOSPITAL_BASED_OUTPATIENT_CLINIC_OR_DEPARTMENT_OTHER): Payer: Self-pay

## 2023-12-09 ENCOUNTER — Emergency Department (HOSPITAL_BASED_OUTPATIENT_CLINIC_OR_DEPARTMENT_OTHER)
Admission: EM | Admit: 2023-12-09 | Discharge: 2023-12-09 | Disposition: A | Discharge status: Home / Self Care | Payer: Medicaid Other | Attending: Emergency Medicine | Admitting: Emergency Medicine

## 2023-12-09 ENCOUNTER — Other Ambulatory Visit: Payer: Self-pay

## 2023-12-09 ENCOUNTER — Emergency Department (HOSPITAL_BASED_OUTPATIENT_CLINIC_OR_DEPARTMENT_OTHER): Payer: Medicaid Other

## 2023-12-09 DIAGNOSIS — E119 Type 2 diabetes mellitus without complications: Secondary | ICD-10-CM | POA: Insufficient documentation

## 2023-12-09 DIAGNOSIS — R1013 Epigastric pain: Secondary | ICD-10-CM | POA: Diagnosis not present

## 2023-12-09 DIAGNOSIS — I10 Essential (primary) hypertension: Secondary | ICD-10-CM | POA: Insufficient documentation

## 2023-12-09 DIAGNOSIS — Z7984 Long term (current) use of oral hypoglycemic drugs: Secondary | ICD-10-CM | POA: Diagnosis not present

## 2023-12-09 DIAGNOSIS — Z794 Long term (current) use of insulin: Secondary | ICD-10-CM | POA: Insufficient documentation

## 2023-12-09 DIAGNOSIS — R1010 Upper abdominal pain, unspecified: Secondary | ICD-10-CM | POA: Diagnosis present

## 2023-12-09 LAB — LIPASE, BLOOD: Lipase: 37 U/L (ref 11–51)

## 2023-12-09 LAB — COMPREHENSIVE METABOLIC PANEL
ALT: 32 U/L (ref 0–44)
AST: 23 U/L (ref 15–41)
Albumin: 3.8 g/dL (ref 3.5–5.0)
Alkaline Phosphatase: 80 U/L (ref 38–126)
Anion gap: 10 (ref 5–15)
BUN: 17 mg/dL (ref 6–20)
CO2: 23 mmol/L (ref 22–32)
Calcium: 9.2 mg/dL (ref 8.9–10.3)
Chloride: 104 mmol/L (ref 98–111)
Creatinine, Ser: 0.69 mg/dL (ref 0.44–1.00)
GFR, Estimated: >60 mL/min (ref >60)
Glucose, Bld: 143 mg/dL — ABNORMAL HIGH (ref 70–99)
Potassium: 4 mmol/L (ref 3.5–5.1)
Sodium: 137 mmol/L (ref 135–145)
Total Bilirubin: 0.5 mg/dL (ref 0.0–1.2)
Total Protein: 7.8 g/dL (ref 6.5–8.1)

## 2023-12-09 LAB — CBC
HCT: 41.5 % (ref 36.0–46.0)
Hemoglobin: 12.9 g/dL (ref 12.0–15.0)
MCH: 23.3 pg — ABNORMAL LOW (ref 26.0–34.0)
MCHC: 31.1 g/dL (ref 30.0–36.0)
MCV: 74.9 fL — ABNORMAL LOW (ref 80.0–100.0)
Platelets: 325 10*3/uL (ref 150–400)
RBC: 5.54 MIL/uL — ABNORMAL HIGH (ref 3.87–5.11)
RDW: 15.2 % (ref 11.5–15.5)
WBC: 8 10*3/uL (ref 4.0–10.5)
nRBC: 0 % (ref 0.0–0.2)

## 2023-12-09 LAB — URINALYSIS, ROUTINE W REFLEX MICROSCOPIC
Bilirubin Urine: NEGATIVE
Glucose, UA: >=500 mg/dL — AB
Hgb urine dipstick: NEGATIVE
Ketones, ur: 15 mg/dL — AB
Leukocytes,Ua: NEGATIVE
Nitrite: NEGATIVE
Protein, ur: 100 mg/dL — AB
Specific Gravity, Urine: 1.025 (ref 1.005–1.030)
pH: 6 (ref 5.0–8.0)

## 2023-12-09 LAB — URINALYSIS, MICROSCOPIC (REFLEX)

## 2023-12-09 LAB — PREGNANCY, URINE: Preg Test, Ur: NEGATIVE

## 2023-12-09 LAB — OCCULT BLOOD X 1 CARD TO LAB, STOOL: Fecal Occult Bld: NEGATIVE

## 2023-12-09 MED ORDER — ONDANSETRON HCL 4 MG/2ML IJ SOLN
4.0000 mg | Freq: Once | INTRAMUSCULAR | Status: AC
  Administered 2023-12-09: 4 mg via INTRAVENOUS
  Filled 2023-12-09: qty 2

## 2023-12-09 MED ORDER — MORPHINE SULFATE (PF) 4 MG/ML IV SOLN
6.0000 mg | Freq: Once | INTRAVENOUS | Status: AC
  Administered 2023-12-09: 6 mg via INTRAVENOUS
  Filled 2023-12-09: qty 2

## 2023-12-09 MED ORDER — IOHEXOL 300 MG/ML  SOLN
125.0000 mL | Freq: Once | INTRAMUSCULAR | Status: AC | PRN
  Administered 2023-12-09: 125 mL via INTRAVENOUS

## 2023-12-09 MED ORDER — PANTOPRAZOLE SODIUM 40 MG IV SOLR
40.0000 mg | Freq: Once | INTRAVENOUS | Status: AC
  Administered 2023-12-09: 40 mg via INTRAVENOUS
  Filled 2023-12-09: qty 10

## 2023-12-09 NOTE — Discharge Instructions (Addendum)
As discussed, your CT scan appeared normal.  Suspect your symptoms could be secondary to peptic ulcer versus gastritis.  Please continue take your reflux medications as well as your pain medication.  See admission attached your discharge papers regarding lifestyle changes.  Please not hesitate to return if the worrisome signs and symptoms we discussed become apparent.

## 2023-12-09 NOTE — ED Provider Notes (Signed)
[place] [facility] DEPARTMENT AT [facility] Provider Note   CSN: [id number] Arrival date & time: 12/09/23  1219     History  Chief Complaint  Patient presents with   Abdominal Pain    Elizabeth [name] is a 48 y.o. female.   Abdominal Pain   48 year old female presents emergency department with complaints of abdominal pain.  Patient reports pain in her upper middle abdomen that began on Thursday.  Reports chronic abdominal pain of that she is on opiate pain medications.  States that on Thursday, did have episode of tarry appearing diarrhea but without any abnormal bowel movements since then.  Reports upper abdominal pain with radiation to her middle back.  Denies any fever, chills, nausea, vomiting, urinary symptoms.  States that she was diagnosed with a gastric ulcer but has been compliant with her at home reflux medications.  States she has upcoming appointment with surgeon within the next couple of days but due to continued worsening pain, prompted visit to the emergency department.  Past medical history significant for diabetes mellitus, GERD, hepatic steatosis, hypercholesterolemia, hypertension, gastric bypass, gastritis  Home Medications Prior to Admission medications   Medication Sig Start Date End Date Taking? Authorizing Provider  Accu-Chek Softclix Lancets lancets 4 (four) times daily. 08/29/23   [provider]  Blood Glucose Monitoring Suppl DEVI 1 each by Does not apply route in the morning, at noon, and at bedtime. May substitute to any manufacturer covered by patient's insurance. 08/29/23   Sandford Craze, NP  CALCIUM-VITAMIN D PO Take 2 tablets by mouth every evening.    [provider]  Continuous Glucose Sensor (DEXCOM G7 SENSOR) MISC Use to check glucose continuously, change sensor every 10 days 11/12/23   Altamese Nowata, MD  dapagliflozin propanediol (FARXIGA) 10 MG TABS tablet Take 1 tablet (10 mg total) by mouth daily before  breakfast. 10/03/23   Motwani, Komal, MD  escitalopram (LEXAPRO) 20 MG tablet Take 1 tablet (20 mg total) by mouth daily. 09/20/23   Sandford Craze, NP  esomeprazole (NEXIUM) 40 MG capsule Take 1 capsule (40 mg total) by mouth 2 (two) times daily. Open capsule and sprinkle on food at breakfast and dinner for better absorption. 08/12/23     famotidine (PEPCID) 20 MG tablet TAKE 1 TABLET(20 MG) BY MOUTH TWICE DAILY 11/18/23   Sandford Craze, NP  gabapentin (NEURONTIN) 300 MG capsule Take 1 capsule (300 mg total) by mouth 3 (three) times daily. 09/30/23   Sandford Craze, NP  HYDROcodone-acetaminophen (NORCO) 10-325 MG tablet Take by mouth. 08/14/23   [provider]  hydrOXYzine (ATARAX) 25 MG tablet TAKE 2 TABLETS BY MOUTH AT BEDTIME AS NEEDED FOR INSOMNIA 11/18/23 11/17/24  Sandford Craze, NP  insulin lispro (HUMALOG KWIKPEN) 100 UNIT/ML KwikPen Inject 3-7 Units into the skin 3 (three) times daily. 3 units if blood sugar is more than 200, 5 units if blood sugar is more than 250, 7 units if blood sugar is more than 300 10/03/23 11/02/23  Motwani, Komal, MD  JANUVIA 100 MG tablet Take 100 mg by mouth daily. 10/10/23   [provider]  lisinopril (ZESTRIL) 5 MG tablet Take 1 tablet (5 mg total) by mouth daily. 07/11/23   Sandford Craze, NP  metFORMIN (GLUCOPHAGE-XR) 500 MG 24 hr tablet Take 2 tablets (1,000 mg total) by mouth 2 (two) times daily with a meal. 10/03/23   Motwani, Komal, MD  methocarbamol (ROBAXIN) 500 MG tablet Take 1 tablet (500 mg total) by mouth every 6 (six)  hours as needed for up to 20 days for muscle spasms. 11/20/23 12/10/23  Burna Forts, MD  Multiple Vitamin (MULTIVITAMIN WITH MINERALS) TABS tablet Take 2 tablets by mouth every evening.    [provider]  naloxone [facility]) nasal spray 4 mg/0.1 mL SMARTSIG:Both Nares 07/30/23   [provider]  ondansetron (ZOFRAN-ODT) 4 MG disintegrating tablet DISSOLVE 1 TABLET(4 MG) ON THE  TONGUE EVERY 8 HOURS AS NEEDED FOR NAUSEA OR VOMITING 10/02/23   Sandford Craze, NP  rosuvastatin (CRESTOR) 5 MG tablet TAKE 1 TABLET(5 MG) BY MOUTH DAILY 09/23/23   Motwani, Carin Hock, MD  sucralfate (CARAFATE) 1 GM/10ML suspension Take 10 mLs (1 g total) by mouth 4 (four) times daily. 08/05/23     TRULICITY 0.75 MG/0.5ML SOAJ ADMINISTER 0.75 MG UNDER THE SKIN 1 TIME A WEEK 12/08/23   Altamese [place], MD  traZODone (DESYREL) 50 MG tablet 1/2 to 1 tab by mouth at bedtime as needed for sleep 09/04/20 11/20/20  Sandford Craze, NP      Allergies    Tomato    Review of Systems   Review of Systems  Gastrointestinal:  Positive for abdominal pain.  All other systems reviewed and are negative.   Physical Exam Updated Vital Signs BP (!) 144/89 (BP Location: Right Arm)   Pulse 84   Temp 98.1 F (36.7 C) (Oral)   Resp 16   Wt 117.9 kg   LMP 11/20/2023   SpO2 96%   BMI 46.06 kg/m  Physical Exam Vitals and nursing note reviewed.  Constitutional:      General: She is not in acute distress.    Appearance: She is well-developed.  HENT:     Head: Normocephalic and atraumatic.  Eyes:     Conjunctiva/sclera: Conjunctivae normal.  Cardiovascular:     Rate and Rhythm: Normal rate and regular rhythm.     Heart sounds: No murmur heard. Pulmonary:     Effort: Pulmonary effort is normal. No respiratory distress.     Breath sounds: Normal breath sounds.  Abdominal:     Palpations: Abdomen is soft.     Tenderness: There is abdominal tenderness in the epigastric area. There is no right CVA tenderness, left CVA tenderness or guarding.  Musculoskeletal:        General: No swelling.     Cervical back: Neck supple.  Skin:    General: Skin is warm and dry.     Capillary Refill: Capillary refill takes less than 2 seconds.  Neurological:     Mental Status: She is alert.  Psychiatric:        Mood and Affect: Mood normal.     ED Results / Procedures / Treatments   Labs (all labs ordered are  listed, but only abnormal results are displayed) Labs Reviewed  COMPREHENSIVE METABOLIC PANEL - Abnormal; Notable for the following components:      Result Value   Glucose, Bld 143 (*)    All other components within normal limits  CBC - Abnormal; Notable for the following components:   RBC 5.54 (*)    MCV 74.9 (*)    MCH 23.3 (*)    All other components within normal limits  URINALYSIS, ROUTINE W REFLEX MICROSCOPIC - Abnormal; Notable for the following components:   Glucose, UA >=500 (*)    Ketones, ur 15 (*)    Protein, ur 100 (*)    All other components within normal limits  URINALYSIS, MICROSCOPIC (REFLEX) - Abnormal; Notable for the following components:  Bacteria, UA FEW (*)    All other components within normal limits  LIPASE, BLOOD  PREGNANCY, URINE  OCCULT BLOOD X 1 CARD TO LAB, STOOL    EKG EKG Interpretation Date/Time:  Tuesday December 09 2023 13:32:32 EST Ventricular Rate:  83 PR Interval:  145 QRS Duration:  84 QT Interval:  359 QTC Calculation: 422 R Axis:   35  Text Interpretation: Sinus rhythm Low voltage, precordial leads Borderline repolarization abnormality flipped t waves inferior and lateral leads seen on prior No significant change since last tracing Confirmed by Melene Plan [phone]) on 12/09/2023 3:15:46 PM  Radiology CT ABDOMEN PELVIS W CONTRAST Result Date: 12/09/2023 CLINICAL DATA:  Abdominal pain, acute, nonlocalized. Upper abdominal pain, melena EXAM: CT ABDOMEN AND PELVIS WITH CONTRAST TECHNIQUE: Multidetector CT imaging of the abdomen and pelvis was performed using the standard protocol following bolus administration of intravenous contrast. RADIATION DOSE REDUCTION: This exam was performed according to the departmental dose-optimization program which includes automated exposure control, adjustment of the mA and/or kV according to patient size and/or use of iterative reconstruction technique. CONTRAST:  OMNIPAQUE IOHEXOL 300 MG/ML  SOLN  COMPARISON:  07/10/2023 FINDINGS: Lower chest: No acute abnormality. Hepatobiliary: No focal liver abnormality is seen. Status post cholecystectomy. No biliary dilatation. Pancreas: Unremarkable Spleen: Unremarkable Adrenals/Urinary Tract: Adrenal glands are unremarkable. Kidneys are normal, without renal calculi, focal lesion, or hydronephrosis. Bladder is unremarkable. Stomach/Bowel: Surgical changes Roux-en-Y gastric bypass are identified. The stomach, small bowel and large are otherwise unremarkable. No evidence of obstruction or focal inflammation. The appendix is normal. No free intraperitoneal gas or fluid. Vascular/Lymphatic: Aortic atherosclerosis. No enlarged abdominal or pelvic lymph nodes. Reproductive: Intrauterine device in expected position within the uterus. Pelvic organs are otherwise unremarkable. Other: No abdominal wall hernia or abnormality. No abdominopelvic ascites. Musculoskeletal: Osseous structures are age-appropriate. No acute bone abnormality. No lytic or blastic bone lesion. IMPRESSION: 1. No acute intra-abdominal pathology identified. No definite radiographic explanation for the patient's reported symptoms. 2. Surgical changes Roux-en-Y gastric bypass. Aortic Atherosclerosis (ICD10-I70.0). Electronically Signed   By: Helyn Numbers M.D.   On: 12/09/2023 19:51    Procedures Procedures    Medications Ordered in ED Medications  pantoprazole (PROTONIX) injection 40 mg (40 mg Intravenous Given 12/09/23 1737)  iohexol (OMNIPAQUE) 300 MG/ML solution 125 mL (125 mLs Intravenous Contrast Given 12/09/23 1825)  morphine (PF) 4 MG/ML injection 6 mg (6 mg Intravenous Given 12/09/23 1904)  ondansetron (ZOFRAN) injection 4 mg (4 mg Intravenous Given 12/09/23 1925)    ED Course/ Medical Decision Making/ A&P                                 Medical Decision Making Amount and/or Complexity of Data Reviewed Labs: ordered. Radiology: ordered.  Risk Prescription drug  management.   This patient presents to the ED for concern of abdominal pain, this involves an extensive number of treatment options, and is a complaint that carries with it a high risk of complications and morbidity.  The differential diagnosis includes gastritis, PUD, CBD pathology, cholecystitis, SBO/LBO, volvulus, diverticulitis, appendicitis, other   Co morbidities that complicate the patient evaluation  See HPI   Additional history obtained:  Additional history obtained from EMR External records from outside source obtained and reviewed including hospital records   Lab Tests:  I Ordered, and personally interpreted labs.  The pertinent results include: No leukocytosis.  No evidence of anemia.  Platelets within  range.  UA with greater than 500 glucose, 15 ketones, and proteins but otherwise unremarkable.  No Electra abnormalities.  No transaminitis.  No renal dysfunction.  U.  Negative.  Occult negative.  Lipase negative.   Imaging Studies ordered:  I ordered imaging studies including CT abdomen pelvis I independently visualized and interpreted imaging which showed no acute intra-abdominal pathology.  Prior Roux-en-Y. I agree with the radiologist interpretation  Cardiac Monitoring: / EKG:  The patient was maintained on a cardiac monitor.  I personally viewed and interpreted the cardiac monitored which showed an underlying rhythm of: Sinus rhythm   Consultations Obtained:  N/a   Problem List / ED Course / Critical interventions / Medication management  Epigastric abdominal pain I ordered medication including Protonix   Reevaluation of the patient after these medicines showed that the patient improved I have reviewed the patients home medicines and have made adjustments as needed   Social Determinants of Health:  Former cigarette use.  Denies illicit drug use.   Test / Admission - Considered:  Epigastric abdominal pain Vitals signs significant for hypertension  blood pressure 160/103. Otherwise within normal range and stable throughout visit. Laboratory/imaging studies significant for: See above 48 year old female presents emergency department with complaints of epigastric abdominal pain.  Patient with history of Roux-en-Y gastric bypass.  Symptoms present since Thursday.  On exam, tenderness in epigastrium.  Patient did report 1 episode of melena that with negative Hemoccult and reassuring rectal exam.  Laboratory studies reassuring without any acute emergent process.  CT imaging negative for any acute abnormality.  Suspect that patient's symptoms could be secondary to GERD versus gastritis; will recommend continued adherence to reflux medication regimen as well as continued lifestyle modifications.  Follow-up with GI recommended in the outpatient setting for reassessment.  Treatment plan discussed length with patient and she knowledge understanding was agreeable to said plan.  Patient will well-appearing, afebrile, tolerating p.o. without difficulty, in no acute distress. Worrisome signs and symptoms were discussed with the patient, and the patient acknowledged understanding to return to the ED if noticed. Patient was stable upon discharge.          Final Clinical Impression(s) / ED Diagnoses Final diagnoses:  Epigastric abdominal pain    Rx / DC Orders ED Discharge Orders     None         Peter Garter, [place] 12/09/23 2043    Melene Plan, DO 12/09/23 2137

## 2023-12-09 NOTE — ED Triage Notes (Addendum)
Pt reports upper abdominal pain for 1 year. Pain is now constant. Pain under breast and goes to back Pt takes norco for pain Dx with ulcers in October. Black tarry stool on Thursday. No BM since Thursday Pt has hx of weight loss sx and hernia repair and spoke with surgeon regarding pain and nausea. Advised to come to ED.

## 2023-12-11 ENCOUNTER — Other Ambulatory Visit: Payer: Self-pay | Admitting: Family Medicine

## 2023-12-11 ENCOUNTER — Telehealth: Payer: Medicaid Other | Admitting: "Endocrinology

## 2023-12-11 ENCOUNTER — Ambulatory Visit: Payer: Medicaid Other | Admitting: Physical Therapy

## 2023-12-11 ENCOUNTER — Encounter: Payer: Self-pay | Admitting: Family Medicine

## 2023-12-12 ENCOUNTER — Other Ambulatory Visit: Payer: Self-pay | Admitting: Family Medicine

## 2023-12-12 MED ORDER — METHOCARBAMOL 500 MG PO TABS: 500.0000 mg | ORAL_TABLET | Freq: Three times a day (TID) | ORAL | 0 refills | Status: DC | PRN

## 2023-12-12 NOTE — Progress Notes (Signed)
3 week supply robaxin per patient request

## 2023-12-18 ENCOUNTER — Ambulatory Visit: Payer: Medicaid Other | Admitting: Sports Medicine

## 2023-12-25 ENCOUNTER — Ambulatory Visit: Payer: Medicaid Other | Admitting: Physical Therapy

## 2023-12-26 ENCOUNTER — Ambulatory Visit: Payer: Medicaid Other | Admitting: Family

## 2023-12-31 ENCOUNTER — Encounter: Payer: Self-pay | Admitting: Sports Medicine

## 2023-12-31 DIAGNOSIS — Z1231 Encounter for screening mammogram for malignant neoplasm of breast: Secondary | ICD-10-CM

## 2024-01-01 ENCOUNTER — Other Ambulatory Visit: Payer: Self-pay | Admitting: *Deleted

## 2024-01-01 ENCOUNTER — Encounter: Payer: Self-pay | Admitting: "Endocrinology

## 2024-01-01 MED ORDER — METHOCARBAMOL 500 MG PO TABS: 500.0000 mg | ORAL_TABLET | Freq: Three times a day (TID) | ORAL | 0 refills | Status: DC | PRN

## 2024-01-02 ENCOUNTER — Other Ambulatory Visit: Payer: Self-pay | Admitting: "Endocrinology

## 2024-01-02 DIAGNOSIS — E1165 Type 2 diabetes mellitus with hyperglycemia: Secondary | ICD-10-CM

## 2024-01-04 ENCOUNTER — Other Ambulatory Visit: Payer: Self-pay | Admitting: Family

## 2024-01-05 ENCOUNTER — Ambulatory Visit: Admitting: Physical Therapy

## 2024-01-06 ENCOUNTER — Ambulatory Visit: Payer: Medicaid Other | Admitting: Sports Medicine

## 2024-01-08 ENCOUNTER — Ambulatory Visit: Payer: Medicaid Other | Admitting: "Endocrinology

## 2024-01-09 ENCOUNTER — Other Ambulatory Visit

## 2024-01-12 ENCOUNTER — Other Ambulatory Visit

## 2024-01-13 ENCOUNTER — Other Ambulatory Visit: Payer: Self-pay | Admitting: "Endocrinology

## 2024-01-13 ENCOUNTER — Ambulatory Visit (INDEPENDENT_AMBULATORY_CARE_PROVIDER_SITE_OTHER): Admitting: Sports Medicine

## 2024-01-13 ENCOUNTER — Other Ambulatory Visit: Payer: Self-pay | Admitting: Family

## 2024-01-13 ENCOUNTER — Encounter: Payer: Self-pay | Admitting: Sports Medicine

## 2024-01-13 VITALS — BP 130/88 | Ht 63.0 in | Wt 260.0 lb

## 2024-01-13 DIAGNOSIS — M7531 Calcific tendinitis of right shoulder: Secondary | ICD-10-CM

## 2024-01-13 MED ORDER — METHOCARBAMOL 500 MG PO TABS: 500.0000 mg | ORAL_TABLET | Freq: Three times a day (TID) | ORAL | 1 refills | Status: DC | PRN

## 2024-01-13 NOTE — Addendum Note (Signed)
 Addended by: Merrilyn Puma on: 01/13/2024 03:42 PM   Modules accepted: Orders

## 2024-01-13 NOTE — Progress Notes (Signed)
   Subjective:    Patient ID: Elizabeth [name], female    DOB: 1976/07/07, 48 y.o.   MRN: [id number]  HPI  Elizabeth [name] returns today for follow-up on bilateral shoulder pain.  Right shoulder is worse than the left.  She has had multiple treatments in the right shoulder including medications and a recent subacromial cortisone injection.  She has a history of stomach ulcers and feels like the cortisone injection may have upset her stomach.  She has found Robaxin to be helpful.  She takes it 3 times daily.  She has yet to start physical therapy but has been scheduled for next week.  X-rays of both shoulders are reviewed today.  There is some calcific tendinopathy in the right shoulder.  Left shoulder is unremarkable.   Review of Systems As above    Objective:   Physical Exam  Well-developed, well-nourished.  No acute distress  Examination of both shoulders show limited range of motion.  Positive signs of impingement bilaterally.  She does have 4/5 strength with resisted external rotation on the right compared to 5/5 on the left.  5/5 strength with resisted supraspinatus.  Neurovascularly intact distally.      Assessment & Plan:   Bilateral shoulder pain, right greater than left, secondary to rotator cuff tendinopathy  Patient is not a good surgical candidate currently so we are going to hold on further diagnostic imaging for now.  I think she would be a good candidate for ultrasound guided barbotage of her right shoulder so we will make that referral for her.  I would also like for her to start physical therapy as scheduled and I will refill her Robaxin.  Would like to see her back in the office approximately a month after starting physical therapy and after her right shoulder barbotage.  This note was dictated using Dragon naturally speaking software and may contain errors in syntax, spelling, or content which have not been identified prior to signing this note.

## 2024-01-13 NOTE — Addendum Note (Signed)
 Addended by: Merrilyn Puma on: 01/13/2024 03:43 PM   Modules accepted: Orders

## 2024-01-14 ENCOUNTER — Encounter: Payer: Self-pay | Admitting: Sports Medicine

## 2024-01-14 NOTE — Telephone Encounter (Signed)
 Requested Prescriptions   Pending Prescriptions Disp Refills   TRULICITY 0.75 MG/0.5ML SOAJ [Pharmacy Med Name: TRULICITY 0.75MG /0.5ML INJ (4 PENS)] 2 mL 0    Sig: ADMINISTER 0.75 MG UNDER THE SKIN 1 TIME A WEEK

## 2024-01-15 ENCOUNTER — Ambulatory Visit: Payer: Medicaid Other | Admitting: "Endocrinology

## 2024-01-18 ENCOUNTER — Encounter: Payer: Self-pay | Admitting: Sports Medicine

## 2024-01-21 ENCOUNTER — Ambulatory Visit: Payer: Medicaid Other | Admitting: Family

## 2024-01-21 ENCOUNTER — Ambulatory Visit: Payer: Medicaid Other

## 2024-01-21 DIAGNOSIS — Z1231 Encounter for screening mammogram for malignant neoplasm of breast: Secondary | ICD-10-CM | POA: Diagnosis not present

## 2024-01-21 NOTE — Therapy (Addendum)
 OUTPATIENT PHYSICAL THERAPY SHOULDER EVALUATION / DISCHARGE SUMMARY   Patient Name: Elizabeth [name] MRN: [id number] DOB:1976/06/14, 48 y.o., female Today's Date: 01/22/2024  END OF SESSION:  PT End of Session - 01/22/24 0932     Visit Number 1    Date for PT Re-Evaluation 03/18/24    Authorization Type [employer]    Authorization Time Period AUTH pending    Authorization - Visit Number 1    PT Start Time [phone]    PT Stop Time 1015    PT Time Calculation (min) 44 min    Activity Tolerance Patient limited by pain;Patient tolerated treatment well    Behavior During Therapy [facility] for tasks assessed/performed             Past Medical History:  Diagnosis Date   Anemia    Anxiety    Astigmatism    Depression    Diabetes mellitus without complication (HCC)    Dry mouth    Fatigue    GERD (gastroesophageal reflux disease)    Heartburn    Hepatic steatosis    Hepatomegaly    High cholesterol    Hypertension    Keratoconjunctivitis    Liver disease    Muscle pain    Nausea and vomiting 04/21/2023   Ovarian cyst    Positive ANA (antinuclear antibody)    Pulmonary nodules    Vitamin D  deficiency    Weakness    Past Surgical History:  Procedure Laterality Date   BIOPSY  01/18/2021   Procedure: BIOPSY;  Surgeon: San Sandor GAILS, DO;  Location: [facility];  Service: Gastroenterology;;   BIOPSY  10/28/2023   Procedure: BIOPSY;  Surgeon: Lyndel Deward PARAS, MD;  Location: [facility];  Service: General;;   CHOLECYSTECTOMY N/A 05/11/2021   Procedure: LAPAROSCOPIC CHOLECYSTECTOMY WITH INTRAOPERATIVE CHOLANGIOGRAM;  Surgeon: Lyndel Deward PARAS, MD;  Location: [facility];  Service: General;  Laterality: N/A;   DILATION AND CURETTAGE OF UTERUS     ENDOMETRIAL BIOPSY  12/19/2017   normal per pt   ESOPHAGOGASTRODUODENOSCOPY N/A 10/28/2023   Procedure: UPPER ENDOSCOPY;  Surgeon: Lyndel Deward PARAS, MD;  Location: [facility];  Service: General;  Laterality: N/A;    ESOPHAGOGASTRODUODENOSCOPY (EGD) WITH PROPOFOL  N/A 01/18/2021   Procedure: ESOPHAGOGASTRODUODENOSCOPY (EGD) WITH PROPOFOL ;  Surgeon: San Sandor GAILS, DO;  Location: [facility];  Service: Gastroenterology;  Laterality: N/A;   HIATAL HERNIA REPAIR N/A 09/30/2022   Procedure: HERNIA REPAIR HIATAL;  Surgeon: Lyndel Deward PARAS, MD;  Location: [facility];  Service: General;  Laterality: N/A;   LAPAROSCOPIC INSERTION GASTROSTOMY TUBE N/A 12/02/2022   Procedure: LAPAROSCOPIC REMNANT GASTROSTOMY G TUBE;  Surgeon: Lyndel Deward PARAS, MD;  Location: [facility];  Service: General;  Laterality: N/A;   LAPAROSCOPIC ROUX-EN-Y GASTRIC BYPASS WITH HIATAL HERNIA REPAIR  09/30/2022   LAPAROSCOPY N/A 12/02/2022   Procedure: LAPAROSCOPY DIAGNOSTIC;  Surgeon: Lyndel Deward PARAS, MD;  Location: [facility];  Service: General;  Laterality: N/A;   TONSILLECTOMY     TUBAL LIGATION     UPPER GI ENDOSCOPY N/A 09/30/2022   Procedure: UPPER GI ENDOSCOPY;  Surgeon: Lyndel Deward PARAS, MD;  Location: [facility];  Service: General;  Laterality: N/A;   UPPER GI ENDOSCOPY N/A 12/02/2022   Procedure: UPPER ENDOSCOPY;  Surgeon: Lyndel Deward PARAS, MD;  Location: [facility];  Service: General;  Laterality: N/A;   Patient Active Problem List   Diagnosis Date Noted   Multiple gastric ulcers 11/17/2023   Insomnia 08/25/2023   Chronic gastritis without bleeding 04/11/2023  Multiple pulmonary nodules 02/21/2023   Bilateral shoulder pain 02/18/2023   History of gastric bypass 12/02/2022   Positive ANA (antinuclear antibody) 11/26/2022   Excessive bleeding in premenopausal period 11/11/2022   Osteoarthritis of knee 03/19/2022   Left knee pain 02/22/2022   Preventative health care 12/31/2021   Drug-induced constipation 12/31/2021   Depression 11/07/2021   Chronic abdominal pain 04/06/2021   Grief reaction 04/06/2021   Gastroesophageal reflux disease without esophagitis    Hyperlipidemia 01/21/2018   Uncontrolled type 2 diabetes  mellitus with hyperglycemia (HCC) 01/21/2018   Keratoconjunctivitis sicca of both eyes not specified as Sjogren's 04/04/2017   Regular astigmatism of both eyes 04/04/2017   Vitamin D  deficiency 12/02/2016   Essential hypertension 02/07/2016   Morbid obesity with BMI of 50.0-59.9, adult (HCC) 02/07/2016    PCP: Daryl Setter, NP  REFERRING PROVIDER: Arvell Evalene SAUNDERS, DO  REFERRING DIAG: [phone] (ICD-10-CM) - Chronic right shoulder pain   THERAPY DIAG:   Chronic right shoulder pain  Stiffness of right shoulder, not elsewhere classified  Muscle weakness (generalized)  Abnormal posture  RATIONALE FOR EVALUATION AND TREATMENT: Rehabilitation  ONSET DATE: 2024, worse by Dec  NEXT MD VISIT: 01/27/2024 with Dr. Curtis   SUBJECTIVE:                                                                                                                                                                                      SUBJECTIVE STATEMENT: R shoulder pain that started off mild sometime in 2024 and gradually became worse by Dec 2024. Pain started to increase in R shoulder and is restricted in movement. Went to doctor and they stated calcification/bursitis in R shoulder and possible arthritis in both shoulders. Doesn't take NSAIDs or steroid injections due to GI complications.  Hand dominance: Right  PERTINENT HISTORY:  DM, GERD, HTN, depression, anxiety, anemia, weakness, muscle pain  PAIN:  Are you having pain? Yes: NPRS scale: 5/10 currently, 9/10 worst with movement Pain location: All of over shoulder Pain description: Dull, achy Aggravating factors: Moving it in all directions Relieving factors: Heat, muscle relaxer, pain meds (oxycodone )  PRECAUTIONS: None  RED FLAGS: None   WEIGHT BEARING RESTRICTIONS: No  FALLS:  Has patient fallen in last 6 months? No  LIVING ENVIRONMENT: Lives with: lives with their family Lives in: House/apartment Stairs: Yes:  External: 1 steps; none Has following equipment at home: Single point cane and Walker - 2 wheeled  OCCUPATION: Currently not working  PLOF: Independent and Leisure: Cook, read, and spend time with family  PATIENT GOALS: To be more mobile with arms   OBJECTIVE:  Note: Objective measures were completed at Evaluation unless  otherwise noted.  DIAGNOSTIC FINDINGS:  CLINICAL DATA:  Chronic bilateral shoulder pain.   EXAM: RIGHT SHOULDER - 2+ VIEW   COMPARISON:  None Available.   FINDINGS: There is no evidence of fracture or dislocation. Slight acromioclavicular spurring. Glenohumeral joint space is normal. No erosive change or focal bone abnormality. There is a 9 mm soft tissue calcification adjacent to the lateral humeral head.   IMPRESSION: A 9 mm soft tissue calcification adjacent to the lateral humerus may represent calcific tendinopathy or bursitis.  PATIENT SURVEYS:  Quick Dash 79.5/100 = 79.5%  COGNITION: Overall cognitive status: Within functional limits for tasks assessed     SENSATION: WFL  POSTURE: Rounded shoulders, forward head, L shoulder slightly higher than R  UPPER EXTREMITY ROM:   Active ROM Right eval Left eval  Shoulder flexion 111 p! 145 p!  Shoulder extension 38 p! Posterior shoulder 45  Shoulder abduction 97 p! 129 p! (Dull)  Shoulder horiz adduction Limited - sharp p!   Shoulder internal rotation 64 p! (Dull)   Shoulder external rotation 23 p! (Dull)   Elbow flexion    Elbow extension    Wrist flexion    Wrist extension    Wrist ulnar deviation    Wrist radial deviation    Wrist pronation    Wrist supination    (Blank rows = not tested, p! = pain)  UPPER EXTREMITY MMT:  MMT Right eval Left eval  Shoulder flexion 2 3+  Shoulder extension 3- 3+ p!  Shoulder abduction 2 p! 3+ dull p!  Shoulder adduction    Shoulder internal rotation 3 p! 2 sharp p!  Shoulder external rotation 2 p! 3 dull p!  Middle trapezius    Lower trapezius     Elbow flexion    Elbow extension    Wrist flexion    Wrist extension    Wrist ulnar deviation    Wrist radial deviation    Wrist pronation    Wrist supination    Grip strength (lbs)    (Blank rows = not tested)  SHOULDER SPECIAL TESTS: Impingement tests: Hawkins/Kennedy impingement test: positive  Rotator cuff assessment: Empty can test: positive   PALPATION:  Sharp pain with palpation on anterior and lateral aspect of R shoulder                                                                                                                             TREATMENT DATE:   01/22/2024 THERAPEUTIC EXERCISE: To improve endurance, ROM, and flexibility.  Demonstration, verbal and tactile cues throughout for technique. Shoulder Rolls in sitting x 10 Doorway Pec Stretch at 60 Elevation 2 x 30' Circular Shoulder Pendulum w/ Table Support x 10 - discomfort when moving only arm, told to move hips side to side to create motion in arm   PATIENT EDUCATION: Education details: Initial HEP, anticipated POC Person educated: Patient Education method: Explanation, Demonstration, Verbal cues, and Handouts Education comprehension: verbalized understanding, returned  demonstration, verbal cues required, and needs further education  HOME EXERCISE PROGRAM: Access Code: [email or web address] URL: [email or web address] Date: 01/22/2024 Prepared by: Santosha Jividen Joseph-Greene  Exercises - Shoulder Rolls in Sitting  - 2 x daily - 7 x weekly - 2 sets - 10 reps - Doorway Pec Stretch at 60 Elevation  - 2 x daily - 7 x weekly - 5 reps - 30 sec hold - Circular Shoulder Pendulum with Table Support  - 3 x daily - 7 x weekly - 2 sets - 10 reps  ASSESSMENT:  CLINICAL IMPRESSION: Elizabeth [name] is a 48 y.o. female who was seen today for physical therapy evaluation and treatment for R shoulder pain due to Calcific tendinitis. R shoulder pain began sometime in 2024 that initially was mild but started to become increasingly  worse by Dec 2024. She shows limitations due to pain in all planes of motion in R shoulder and shows some limitations in L shoulder but displayed better ROM in L>R. Elizabeth [name] was able to lift R arm at least 50% but the pain was too sharp, therefore, we were unable to conduct resisted MMT on that side.This impacts her ability to do ADLs, sleep and spend time with her family. Mentioned that she had to quit working due to other existing co-morbidities like GI complications. She does have AD in the house that she can use if she needs to. Elizabeth [name] will benefit from skilled PT to address above deficits and limitations to improve mobility and activity tolerance with decreased pain interference.   OBJECTIVE IMPAIRMENTS: decreased activity tolerance, decreased endurance, decreased knowledge of condition, decreased mobility, decreased ROM, decreased strength, increased fascial restrictions, impaired perceived functional ability, increased muscle spasms, impaired flexibility, impaired sensation, impaired tone, impaired UE functional use, improper body mechanics, postural dysfunction, and pain.   ACTIVITY LIMITATIONS: carrying, lifting, sleeping, bathing, dressing, reach over head, and hygiene/grooming  PARTICIPATION LIMITATIONS: meal prep, cleaning, laundry, shopping, community activity, and occupation  PERSONAL FACTORS: Past/current experiences, Time since onset of injury/illness/exacerbation, and 3+ comorbidities: DM, GERD, HTN, depression, anxiety, anemia, weakness, muscle pain, obesityare also affecting patient's functional outcome.   REHAB POTENTIAL: Good  CLINICAL DECISION MAKING: Unstable/unpredictable  EVALUATION COMPLEXITY: High   GOALS: Goals reviewed with patient? Yes  SHORT TERM GOALS: Target date: 02/19/2024  Pt will be independent with initial HEP Baseline: Goal status: INITIAL  2.  Pt will report 25% improvement in R shoulder pain to improve QoL Baseline: 5/10 currently, 9/10 worst Goal  status: INITIAL  3.  Pt will report <=65.5% (MCID = 14 %) on QuickDASH to demonstrate improve UE functional ability Baseline: 79.5/100 = 79.5% Goal status: INITIAL  4.  Pt will be able to increase R shoulder ROM 10-15 w/ decreased pain for functional UE use Baseline: Refer to ROM table Goal status: INITIAL   LONG TERM GOALS: Target date: 03/18/2024  Pt will report 50-75% improvement in R shoulder pain to improve QoL Baseline: 5/10 currently, 9/10 worst Goal status: INITIAL  2.  Pt will demonstrate improved B shoulder strength to >=4/5 for functional UE use Baseline: Refer to MMT  Goal status: INITIAL  3.  Pt will report the ability to sleep for at least 4-5 hours w/o increased pain Baseline: Severe difficulty to sleep - QuickDASH Goal status: INITIAL  4.  Pt will be able to do ADLs and household related tasks w/o limitations due to R shoulder pain, ROM, and weakness Baseline: Unable to do ADLs/household chores - QuickDASH Goal status: INITIAL  5.  Pt will be  improve R shoulder ROM 30 for functional UE use Baseline: Refer to above UE ROM table Goal status: INITIAL   PLAN:  PT FREQUENCY: 2x/week  PT DURATION: 8 weeks  PLANNED INTERVENTIONS: 97164- PT Re-evaluation, 97110-Therapeutic exercises, 97530- Therapeutic activity, 97112- Neuromuscular re-education, 97535- Self Care, 02859- Manual therapy, [phone]- Aquatic Therapy, H9716- Electrical stimulation (unattended), 97016- Vasopneumatic device, 97035- Ultrasound, 02966- Ionotophoresis 4mg /ml Dexamethasone , Patient/Family education, Taping, Dry Needling, Joint mobilization, Cryotherapy, and Moist heat  PLAN FOR NEXT SESSION: Review initial HEP, postural correction/scapular strengthening, introduce flexibility/ROM & UE strengthening exercises - based on pt tolerance   Filiberto Wamble Joseph-Greene, Student-PT 01/22/2024, 3:03 PM   PHYSICAL THERAPY DISCHARGE SUMMARY  Visits from Start of Care: 1  Current functional level related to  goals / functional outcomes: Refer to above clinical impression and goal assessment for status as of eval visit on 01/22/2024. Patient cancelled all scheduled f/u treatment visits and did not return to PT in >30 days, therefore will proceed with discharge from PT for this episode.     Remaining deficits: As above. Unable to formally assess status at discharge due to failure to return to PT.    Education / Equipment: Initial HEP   Patient agrees to discharge. Patient goals were not met. Patient is being discharged due to not returning since the last visit.  Elijah EMERSON Hidden, PT 05/25/2024, 10:00 AM  [facility] [address]  [address] [place], [place], [address] Phone: [phone]   Fax:  [phone]

## 2024-01-22 ENCOUNTER — Other Ambulatory Visit: Payer: Self-pay

## 2024-01-22 ENCOUNTER — Ambulatory Visit: Attending: Sports Medicine | Admitting: Physical Therapy

## 2024-01-22 DIAGNOSIS — M25611 Stiffness of right shoulder, not elsewhere classified: Secondary | ICD-10-CM | POA: Diagnosis present

## 2024-01-22 DIAGNOSIS — M6281 Muscle weakness (generalized): Secondary | ICD-10-CM | POA: Diagnosis present

## 2024-01-22 DIAGNOSIS — G8929 Other chronic pain: Secondary | ICD-10-CM | POA: Insufficient documentation

## 2024-01-22 DIAGNOSIS — M25511 Pain in right shoulder: Secondary | ICD-10-CM | POA: Diagnosis present

## 2024-01-22 DIAGNOSIS — R293 Abnormal posture: Secondary | ICD-10-CM | POA: Diagnosis present

## 2024-01-23 ENCOUNTER — Encounter: Payer: Self-pay | Admitting: Physical Therapy

## 2024-01-26 ENCOUNTER — Encounter: Admitting: Physical Therapy

## 2024-01-27 ENCOUNTER — Encounter: Payer: Self-pay | Admitting: Sports Medicine

## 2024-01-27 ENCOUNTER — Ambulatory Visit (INDEPENDENT_AMBULATORY_CARE_PROVIDER_SITE_OTHER): Admitting: Sports Medicine

## 2024-01-27 VITALS — Ht 63.0 in | Wt 262.0 lb

## 2024-01-27 DIAGNOSIS — M25511 Pain in right shoulder: Secondary | ICD-10-CM

## 2024-01-27 DIAGNOSIS — G8929 Other chronic pain: Secondary | ICD-10-CM | POA: Diagnosis not present

## 2024-01-27 DIAGNOSIS — M25512 Pain in left shoulder: Secondary | ICD-10-CM | POA: Diagnosis not present

## 2024-01-27 NOTE — Progress Notes (Signed)
    Procedures performed today:    None.  Independent interpretation of notes and tests performed by another provider:   I did personally review the shoulder x-rays, there is a hint of calcification within what appears to be the distal supraspinatus.  Brief History, Exam, Impression, and Recommendations:    Bilateral shoulder pain This is a very pleasant 48 year old female, she does have a history of diabetes, she also is status post Roux-en-Y gastric bypass, she has had gastric ulcers and is very sensitive to NSAIDs and steroids. She has had many months of bilateral right worse than left shoulder pain, pain is localized anteriorly, over the deltoid and worse with abduction. She has had treatment with several sports medicine providers, she has not really had much physical therapy, 1 or 2 sessions only. She did get a subacromial injection that provided some relief. Unfortunately she had immediate recurrence of pain, x-rays did show potential calcific tendinopathy adjacent to the lateral humerus. Ultimately she was referred to me for consideration of ultrasound-guided barbotage. On exam she has good passive motion but she does have significant positive impingement signs, she has significantly weak abduction concerning for significant rotator cuff tearing. Though I do think her calcific tendinopathy is at play here I think we are missing a couple of pieces of conservative treatment including aggressive physical therapy. Due to failure of treatment thus far I think it is also reasonable to go ahead and proceed with MRI to evaluate for large rotator cuff tear. She will also do physical therapy for at least 6 solid weeks before we consider barbotage. If we do barbotage I would like a 30-minute appointment and we will not be able to use steroid.    ____________________________________________ Ihor Austin. Benjamin Stain, M.D., ABFM., CAQSM., AME. Primary Care and Sports Medicine [place]  [facility]  Adjunct Professor of Family Medicine  [employer]  Restaurant manager, fast food

## 2024-01-27 NOTE — Assessment & Plan Note (Signed)
 This is a very pleasant 48 year old female, she does have a history of diabetes, she also is status post Roux-en-Y gastric bypass, she has had gastric ulcers and is very sensitive to NSAIDs and steroids. She has had many months of bilateral right worse than left shoulder pain, pain is localized anteriorly, over the deltoid and worse with abduction. She has had treatment with several sports medicine providers, she has not really had much physical therapy, 1 or 2 sessions only. She did get a subacromial injection that provided some relief. Unfortunately she had immediate recurrence of pain, x-rays did show potential calcific tendinopathy adjacent to the lateral humerus. Ultimately she was referred to me for consideration of ultrasound-guided barbotage. On exam she has good passive motion but she does have significant positive impingement signs, she has significantly weak abduction concerning for significant rotator cuff tearing. Though I do think her calcific tendinopathy is at play here I think we are missing a couple of pieces of conservative treatment including aggressive physical therapy. Due to failure of treatment thus far I think it is also reasonable to go ahead and proceed with MRI to evaluate for large rotator cuff tear. She will also do physical therapy for at least 6 solid weeks before we consider barbotage. If we do barbotage I would like a 30-minute appointment and we will not be able to use steroid.

## 2024-01-31 ENCOUNTER — Other Ambulatory Visit

## 2024-02-03 ENCOUNTER — Ambulatory Visit

## 2024-02-03 DIAGNOSIS — M25511 Pain in right shoulder: Secondary | ICD-10-CM

## 2024-02-03 DIAGNOSIS — G8929 Other chronic pain: Secondary | ICD-10-CM

## 2024-02-09 ENCOUNTER — Ambulatory Visit

## 2024-02-12 ENCOUNTER — Encounter: Admitting: Physical Therapy

## 2024-02-16 ENCOUNTER — Ambulatory Visit

## 2024-02-16 ENCOUNTER — Other Ambulatory Visit: Payer: Self-pay | Admitting: "Endocrinology

## 2024-02-17 NOTE — Telephone Encounter (Signed)
 Requested Prescriptions   Pending Prescriptions Disp Refills   metFORMIN  (GLUCOPHAGE -XR) 500 MG 24 hr tablet [Pharmacy Med Name: METFORMIN  ER 500MG  24HR TABS] 60 tablet 4    Sig: TAKE 2 TABLETS(1000 MG) BY MOUTH TWICE DAILY WITH A MEAL

## 2024-02-18 ENCOUNTER — Ambulatory Visit: Admitting: Sports Medicine

## 2024-02-18 ENCOUNTER — Encounter: Payer: Self-pay | Admitting: Family

## 2024-02-18 DIAGNOSIS — E1165 Type 2 diabetes mellitus with hyperglycemia: Secondary | ICD-10-CM

## 2024-02-18 DIAGNOSIS — E785 Hyperlipidemia, unspecified: Secondary | ICD-10-CM

## 2024-02-19 ENCOUNTER — Encounter: Admitting: Physical Therapy

## 2024-02-20 ENCOUNTER — Encounter: Payer: Self-pay | Admitting: Family

## 2024-02-20 ENCOUNTER — Other Ambulatory Visit (INDEPENDENT_AMBULATORY_CARE_PROVIDER_SITE_OTHER)

## 2024-02-20 DIAGNOSIS — E1165 Type 2 diabetes mellitus with hyperglycemia: Secondary | ICD-10-CM | POA: Diagnosis not present

## 2024-02-20 DIAGNOSIS — E785 Hyperlipidemia, unspecified: Secondary | ICD-10-CM

## 2024-02-20 LAB — LIPID PANEL
Cholesterol: 158 mg/dL (ref 0–200)
HDL: 51.6 mg/dL (ref >39.00)
LDL Cholesterol: 84 mg/dL (ref 0–99)
NonHDL: 106.59
Total CHOL/HDL Ratio: 3
Triglycerides: 112 mg/dL (ref 0.0–149.0)
VLDL: 22.4 mg/dL (ref 0.0–40.0)

## 2024-02-20 LAB — HEMOGLOBIN A1C: Hgb A1c MFr Bld: 7.6 % — ABNORMAL HIGH (ref 4.6–6.5)

## 2024-02-23 ENCOUNTER — Encounter

## 2024-02-23 ENCOUNTER — Ambulatory Visit: Admitting: Obstetrics & Gynecology

## 2024-02-23 ENCOUNTER — Encounter: Payer: Self-pay | Admitting: Obstetrics & Gynecology

## 2024-02-23 VITALS — BP 115/82 | HR 84 | Ht 62.0 in | Wt 258.0 lb

## 2024-02-23 DIAGNOSIS — Z30431 Encounter for routine checking of intrauterine contraceptive device: Secondary | ICD-10-CM | POA: Diagnosis not present

## 2024-02-23 DIAGNOSIS — N912 Amenorrhea, unspecified: Secondary | ICD-10-CM | POA: Diagnosis not present

## 2024-02-23 NOTE — Progress Notes (Signed)
   Subjective:    Patient ID: Elizabeth [name], female    DOB: 04/12/1976, 48 y.o.   MRN: [id number]  HPI  Elizabeth [name] is a 48 year old female who presents for IUD checkup.  She had a IUD placed last year.  She did not come for the string check appointment.  She had some imaging done with her gastric bypass and her [facility] physician thought that the IUD could possibly be misplaced.  I see 1 report that says the IUD is centrally placed.  I do not see a CT MRI or ultrasound.  Patient does have light periods that may be last 1 day.  This would be consistent with the IUD in the correct spot.  She is having hot flashes and flushes and is interested to see if she is nearing menopause.  She denies pelvic pain.  She is having pain with her stomach and is getting worked up for a hiatal hernia.  Review of Systems  Constitutional: Negative.        +hot flashes  Respiratory: Negative.    Cardiovascular: Negative.   Gastrointestinal: Negative.        Upper GI complaints--seen by GI  Genitourinary: Negative.  Negative for pelvic pain.  Psychiatric/Behavioral: Negative.         Objective:   Physical Exam Vitals reviewed.  Constitutional:      General: She is not in acute distress.    Appearance: She is well-developed.  HENT:     Head: Normocephalic and atraumatic.  Eyes:     Conjunctiva/sclera: Conjunctivae normal.  Cardiovascular:     Rate and Rhythm: Normal rate.  Pulmonary:     Effort: Pulmonary effort is normal.  Genitourinary:    Comments: Tanner V Vulva:  No lesion Vagina:  Pink, no lesions, no discharge, no blood Cervix:  No CMT, IUD strings aprox 3 cm and normal length.  Skin:    General: Skin is warm and dry.  Neurological:     Mental Status: She is alert and oriented to person, place, and time.  Psychiatric:        Mood and Affect: Mood normal.    Vitals:   02/23/24 0937  BP: 115/82  Pulse: 84  Weight: 258 lb (117 kg)  Height: 5\' 2"  (1.575 m)   FINDINGS: Scout Radiograph:  Moderate stool burden. An IUD projects over the midline in the pelvis.      Assessment & Plan:  48 yo female with IUD strings nml length; New hot flashes.   Hot flashes--check estradiol, FSH If there are further questions about IUD placement, a transvaginal ultrasound would be the next best modality for checking placement.

## 2024-02-24 ENCOUNTER — Encounter: Admitting: Family

## 2024-02-24 ENCOUNTER — Ambulatory Visit: Admitting: Sports Medicine

## 2024-02-24 LAB — ESTRADIOL: Estradiol: 14.8 pg/mL

## 2024-02-24 LAB — FOLLICLE STIMULATING HORMONE: FSH: 26.7 m[IU]/mL

## 2024-02-25 ENCOUNTER — Encounter: Payer: Self-pay | Admitting: Obstetrics & Gynecology

## 2024-02-26 ENCOUNTER — Encounter: Admitting: Physical Therapy

## 2024-02-26 ENCOUNTER — Ambulatory Visit: Admitting: "Endocrinology

## 2024-02-27 ENCOUNTER — Encounter: Admitting: Family

## 2024-02-29 ENCOUNTER — Other Ambulatory Visit: Payer: Self-pay | Admitting: "Endocrinology

## 2024-03-01 ENCOUNTER — Encounter

## 2024-03-01 NOTE — Telephone Encounter (Signed)
 Requested Prescriptions   Pending Prescriptions Disp Refills   TRULICITY 0.75 MG/0.5ML SOAJ [Pharmacy Med Name: TRULICITY 0.75MG /0.5ML INJ (4 PENS)] 2 mL 0    Sig: ADMINISTER 0.75 MG UNDER THE SKIN 1 TIME A WEEK

## 2024-03-04 ENCOUNTER — Ambulatory Visit: Admitting: Obstetrics and Gynecology

## 2024-03-04 ENCOUNTER — Encounter: Payer: Self-pay | Admitting: Sports Medicine

## 2024-03-04 ENCOUNTER — Other Ambulatory Visit: Payer: Self-pay | Admitting: *Deleted

## 2024-03-04 ENCOUNTER — Encounter: Payer: Self-pay | Admitting: Obstetrics and Gynecology

## 2024-03-04 ENCOUNTER — Encounter: Admitting: Physical Therapy

## 2024-03-04 ENCOUNTER — Other Ambulatory Visit (HOSPITAL_COMMUNITY)
Admission: RE | Admit: 2024-03-04 | Discharge: 2024-03-04 | Disposition: A | Discharge status: Home / Self Care | Source: Ambulatory Visit | Attending: Obstetrics and Gynecology | Admitting: Obstetrics and Gynecology

## 2024-03-04 VITALS — BP 133/86 | HR 86 | Ht 62.0 in | Wt 258.0 lb

## 2024-03-04 DIAGNOSIS — N921 Excessive and frequent menstruation with irregular cycle: Secondary | ICD-10-CM | POA: Diagnosis not present

## 2024-03-04 DIAGNOSIS — N898 Other specified noninflammatory disorders of vagina: Secondary | ICD-10-CM

## 2024-03-04 DIAGNOSIS — Z01818 Encounter for other preprocedural examination: Secondary | ICD-10-CM

## 2024-03-04 DIAGNOSIS — T8384XD Pain from genitourinary prosthetic devices, implants and grafts, subsequent encounter: Secondary | ICD-10-CM

## 2024-03-04 MED ORDER — METHOCARBAMOL 500 MG PO TABS: 500.0000 mg | ORAL_TABLET | Freq: Three times a day (TID) | ORAL | 0 refills | Status: DC | PRN

## 2024-03-04 NOTE — Progress Notes (Signed)
 GYNECOLOGY OFFICE VISIT NOTE  History:  Elizabeth [name] is a 48 y.o. [id number] here today for hysterectomy consult.  She has an IUD but it has been causing her cramping and pain and she would prefer definitive management. IUD placed 12/2022. She has had them before.   She notes vaginal discharge - has odor. No itching/burning.   She had a CT in February 2025 which showed IUD in her uterus and uterus of normal size. No adnexal masses. Her last pelvic US  was in 2019 and it showed only a small 7 mm fibroid.   Reviewed her most recent operative note from 11/2022. Adhesions noted between falciform ligament and transverse colon. These were reduced laparoscopically. No lower adhesions were reported. Her gall bladder op note and gastric bypass note do not report adhesions.   Past Medical History:  Diagnosis Date   Anemia    Anxiety    Astigmatism    Depression    Diabetes mellitus without complication (HCC)    Dry mouth    Fatigue    GERD (gastroesophageal reflux disease)    Heartburn    Hepatic steatosis    Hepatomegaly    High cholesterol    Hypertension    Keratoconjunctivitis    Liver disease    Muscle pain    Nausea and vomiting 04/21/2023   Ovarian cyst    Positive ANA (antinuclear antibody)    Pulmonary nodules    Stomach ulcer    Vitamin D  deficiency    Weakness     Past Surgical History:  Procedure Laterality Date   BIOPSY  01/18/2021   Procedure: BIOPSY;  Surgeon: Annis Kinder, DO;  Location: [facility];  Service: Gastroenterology;;   BIOPSY  10/28/2023   Procedure: BIOPSY;  Surgeon: Junie Olds, MD;  Location: [facility];  Service: General;;   CHOLECYSTECTOMY N/A 05/11/2021   Procedure: LAPAROSCOPIC CHOLECYSTECTOMY WITH INTRAOPERATIVE CHOLANGIOGRAM;  Surgeon: Junie Olds, MD;  Location: [facility];  Service: General;  Laterality: N/A;   DILATION AND CURETTAGE OF UTERUS     ENDOMETRIAL BIOPSY  12/19/2017   normal per pt    ESOPHAGOGASTRODUODENOSCOPY N/A 10/28/2023   Procedure: UPPER ENDOSCOPY;  Surgeon: Junie Olds, MD;  Location: [facility];  Service: General;  Laterality: N/A;   ESOPHAGOGASTRODUODENOSCOPY (EGD) WITH PROPOFOL  N/A 01/18/2021   Procedure: ESOPHAGOGASTRODUODENOSCOPY (EGD) WITH PROPOFOL ;  Surgeon: Annis Kinder, DO;  Location: [facility];  Service: Gastroenterology;  Laterality: N/A;   HIATAL HERNIA REPAIR N/A 09/30/2022   Procedure: HERNIA REPAIR HIATAL;  Surgeon: Junie Olds, MD;  Location: [facility];  Service: General;  Laterality: N/A;   LAPAROSCOPIC INSERTION GASTROSTOMY TUBE N/A 12/02/2022   Procedure: LAPAROSCOPIC REMNANT GASTROSTOMY G TUBE;  Surgeon: Junie Olds, MD;  Location: [facility];  Service: General;  Laterality: N/A;   LAPAROSCOPIC ROUX-EN-Y GASTRIC BYPASS WITH HIATAL HERNIA REPAIR  09/30/2022   LAPAROSCOPY N/A 12/02/2022   Procedure: LAPAROSCOPY DIAGNOSTIC;  Surgeon: Junie Olds, MD;  Location: [facility];  Service: General;  Laterality: N/A;   TONSILLECTOMY     TUBAL LIGATION     UPPER GI ENDOSCOPY N/A 09/30/2022   Procedure: UPPER GI ENDOSCOPY;  Surgeon: Junie Olds, MD;  Location: [facility];  Service: General;  Laterality: N/A;   UPPER GI ENDOSCOPY N/A 12/02/2022   Procedure: UPPER ENDOSCOPY;  Surgeon: Junie Olds, MD;  Location: [facility];  Service: General;  Laterality: N/A;    The following portions of the patient's history were reviewed  and updated as appropriate: allergies, current medications, past family history, past medical history, past social history, past surgical history and problem list.   Health Maintenance:   Normal pap and negative HRHPV on 12/2020.   Diagnosis  Date Value Ref Range Status  12/25/2020   Final   - Negative for intraepithelial lesion or malignancy (NILM)     Review of Systems:  Pertinent items noted in HPI and remainder of comprehensive ROS otherwise negative.  Physical Exam:  BP 133/86   Pulse  86   Ht 5\' 2"  (1.575 m)   Wt 258 lb (117 kg)   BMI 47.19 kg/m  CONSTITUTIONAL: Well-developed, well-nourished female in no acute distress.  HEENT:  Normocephalic, atraumatic. External right and left ear normal. No scleral icterus.  NECK: Normal range of motion, supple, no masses noted on observation SKIN: No rash noted. Not diaphoretic. No erythema. No pallor. MUSCULOSKELETAL: Normal range of motion. No edema noted. NEUROLOGIC: Alert and oriented to person, place, and time. Normal muscle tone coordination. No cranial nerve deficit noted. PSYCHIATRIC: Normal mood and affect. Normal behavior. Normal judgment and thought content.  ABDOMEN: No masses noted. No other overt distention noted.  L/S incisions noted.   PELVIC: Normal appearing external genitalia; normal urethral meatus; normal appearing vaginal mucosa and cervix.  No abnormal discharge noted.  IUD strings visualized. Minimal decensus. Performed in the presence of a chaperone  A1C on 5/2 was 7.6.   Assessment and Plan:  1. Vaginal discharge (Primary) Check cultures - may be physiologic.  - Cervicovaginal ancillary only  2. Menorrhagia with irregular cycle When not with the IUD her cycles are very heavy which is why she has the IUD. She does not use it for birth control.   3. Pain due to intrauterine contraceptive device (IUD), subsequent encounter She has had IUDs in the past because she was not able to have a hysterectomy d/t weight/BMI. She has undergone gastric bypass and has lost about 65 lbs. She would like to have the hysterectomy now due to the issues she is having with the IUD.   4. Preoperative Exam - Diagnosis: Heavy bleeding, irregular periods, desires definitive management, family history of ovarian cancer - Planned surgery: TLH, BS, cystoscopy, TAP block - Risks of surgery include but are not limited to:  Bleeding - Can bleed enough to need transfusion or need for additional surgeries I.e. conversation to open  surgery.  Infection - The vagina can develop cuff infection Injury to surrounding organs/tissues (i.e. bowel/bladder/ureters) - discussed how each of these is managed I.e. bladder injury requires catheter for 10-14 days after surgery. Discussed the greatest risk for her surgery would be injury to the bowel due to potential scar tissue due to her prior abdominal L/S surgeries. Most of her surgeries have been in her upper abdomen. Op notes reviewed as noted above.  Need for additional procedures - Would be specific to a complication or injury Wound complications - No specific wound unless converted to open surgery or laparoscopic Hospital re-admission - In the event of a delayed complication being recognized I.e. ureteral injury discussed Conversion to open surgery - reviewed some complications may necessitate or it may be the only way to complete the surgery.   VTE - Discussed risk of blood clots following delivery - We discussed alternatives to hysterectomy including birth control options that will work better to regulate bleeding, I.e. IUD, endometrial ablation. She desires definitive management.  - We also discussed the option for removal of ovaries versus  tubes alone. Reviewed that because she is <50 and still has benefit from her ovaries, I would recommend keeping them in. However, I would recommend removal of tubes and this will be more definitively able to be done laparoscopically than vaginally.  - We discussed postop restrictions, precautions and expectations - Preop testing needed:  None, she has recent EKG, A1C and CMP/CBC, lipid profile.  - All questions answered   Routine preventative health maintenance measures emphasized. Please refer to After Visit Summary for other counseling recommendations.   Return if symptoms worsen or fail to improve.  Lacey Pian, MD, FACOG Obstetrician & Gynecologist, [facility] for [facility], [facility]

## 2024-03-06 ENCOUNTER — Encounter: Payer: Self-pay | Admitting: "Endocrinology

## 2024-03-08 ENCOUNTER — Telehealth: Payer: Self-pay

## 2024-03-08 LAB — CERVICOVAGINAL ANCILLARY ONLY
Bacterial Vaginitis (gardnerella): POSITIVE — AB
Candida Glabrata: NEGATIVE
Candida Vaginitis: NEGATIVE
Comment: NEGATIVE
Comment: NEGATIVE
Comment: NEGATIVE

## 2024-03-08 NOTE — Telephone Encounter (Signed)
 Please see encounter from 11/24/2023. Prior Authorization form/request asks a question that requires your assistance. Please see the question below and advise accordingly. The PA will not be submitted until the necessary information is received.   Pt must be using insulin . Her chart shows insulin  was d/c'd 09/2023 and she has not picked up any since then. Please advise

## 2024-03-09 ENCOUNTER — Ambulatory Visit: Payer: Self-pay | Admitting: Obstetrics and Gynecology

## 2024-03-09 ENCOUNTER — Ambulatory Visit: Admitting: Sports Medicine

## 2024-03-09 DIAGNOSIS — B9689 Other specified bacterial agents as the cause of diseases classified elsewhere: Secondary | ICD-10-CM

## 2024-03-09 MED ORDER — METRONIDAZOLE 500 MG PO TABS: 500.0000 mg | ORAL_TABLET | Freq: Two times a day (BID) | ORAL | 0 refills | Status: DC

## 2024-03-11 ENCOUNTER — Ambulatory Visit: Admitting: Sports Medicine

## 2024-03-12 ENCOUNTER — Ambulatory Visit: Admitting: Sports Medicine

## 2024-03-14 ENCOUNTER — Encounter: Payer: Self-pay | Admitting: Obstetrics and Gynecology

## 2024-03-14 ENCOUNTER — Other Ambulatory Visit: Payer: Self-pay

## 2024-03-14 DIAGNOSIS — L680 Hirsutism: Secondary | ICD-10-CM

## 2024-03-16 ENCOUNTER — Encounter: Payer: Self-pay | Admitting: Sports Medicine

## 2024-03-16 ENCOUNTER — Ambulatory Visit (INDEPENDENT_AMBULATORY_CARE_PROVIDER_SITE_OTHER): Admitting: Sports Medicine

## 2024-03-16 VITALS — BP 140/90 | Ht 62.0 in | Wt 258.0 lb

## 2024-03-16 DIAGNOSIS — M67911 Unspecified disorder of synovium and tendon, right shoulder: Secondary | ICD-10-CM | POA: Diagnosis present

## 2024-03-16 MED ORDER — SPIRONOLACTONE 50 MG PO TABS
50.0000 mg | ORAL_TABLET | Freq: Two times a day (BID) | ORAL | 2 refills | Status: DC
Start: 2024-03-16 — End: 2024-08-24

## 2024-03-16 NOTE — Progress Notes (Signed)
 Patient ID: Elizabeth [name], female   DOB: 06/27/1976, 48 y.o.   MRN: [id number]  Elizabeth [name] presents today for follow-up on chronic right shoulder pain.  She saw Dr. Elva Hamburger in [place] who elected against barbotage of her calcific rotator cuff tendinopathy.  She instead ordered an MRI which shows an area of focal tendinosis of the rotator cuff in addition to the calcifications.  Mild degenerative changes in the [facility] and glenohumeral joint.  Physical exam was not repeated today.  We simply talked about next steps in treatment.  She has had cortisone injections in the past without relief.  She has not had physical therapy.  I recommended that we give a 4 to 6-week trial of physical therapy and then she will follow-up with me at the end of that time.  I also discussed possible referral to orthopedics to discuss arthroscopic debridement if her pain persists.  At this point in time she has multiple medical issues that need addressing, so any orthopedic surgery would need to be done at a later date.  This note was dictated using Dragon naturally speaking software and may contain errors in syntax, spelling, or content which have not been identified prior to signing this note.

## 2024-03-17 ENCOUNTER — Encounter: Admitting: Family

## 2024-03-18 ENCOUNTER — Other Ambulatory Visit (HOSPITAL_COMMUNITY): Payer: Self-pay

## 2024-03-18 ENCOUNTER — Other Ambulatory Visit: Payer: Self-pay | Admitting: Family

## 2024-03-18 NOTE — Telephone Encounter (Signed)
 I have submitted the PA with the info provided.  Key: Elizabeth [name] We will see if this will be enough to cover.  Status pending

## 2024-03-23 MED ORDER — METFORMIN HCL ER 500 MG PO TB24
500.0000 mg | ORAL_TABLET | Freq: Every day | ORAL | 0 refills | Status: DC
Start: 2024-03-23 — End: 2024-03-24

## 2024-03-24 MED ORDER — METFORMIN HCL ER 500 MG PO TB24
1000.0000 mg | ORAL_TABLET | Freq: Two times a day (BID) | ORAL | 0 refills | Status: DC
Start: 2024-03-24 — End: 2024-04-19

## 2024-03-24 NOTE — Addendum Note (Signed)
 Addended by: Waneta Gut on: 03/24/2024 10:32 AM   Modules accepted: Orders

## 2024-03-24 NOTE — Addendum Note (Signed)
 Addended by: Waneta Gut on: 03/24/2024 10:19 AM   Modules accepted: Orders

## 2024-03-26 ENCOUNTER — Ambulatory Visit (INDEPENDENT_AMBULATORY_CARE_PROVIDER_SITE_OTHER): Admitting: "Endocrinology

## 2024-03-26 ENCOUNTER — Encounter: Payer: Self-pay | Admitting: "Endocrinology

## 2024-03-26 VITALS — BP 120/80 | HR 85 | Ht 63.0 in | Wt 257.0 lb

## 2024-03-26 DIAGNOSIS — E1165 Type 2 diabetes mellitus with hyperglycemia: Secondary | ICD-10-CM | POA: Diagnosis not present

## 2024-03-26 DIAGNOSIS — Z7985 Long-term (current) use of injectable non-insulin antidiabetic drugs: Secondary | ICD-10-CM | POA: Diagnosis not present

## 2024-03-26 DIAGNOSIS — Z7984 Long term (current) use of oral hypoglycemic drugs: Secondary | ICD-10-CM

## 2024-03-26 DIAGNOSIS — E7800 Pure hypercholesterolemia, unspecified: Secondary | ICD-10-CM | POA: Diagnosis not present

## 2024-03-26 MED ORDER — TRULICITY 1.5 MG/0.5ML ~~LOC~~ SOAJ: 1.5000 mg | SUBCUTANEOUS | 0 refills | Status: DC

## 2024-03-26 NOTE — Progress Notes (Signed)
 Outpatient Endocrinology Note Elizabeth Newcomer, MD  03/26/24   Elizabeth [name] 1976-01-06 [id number]  Referring Provider: Dorrene Gaucher, NP Primary Care Provider: Dorrene Gaucher, NP Reason for consultation: Subjective   Assessment & Plan  Diagnoses and all orders for this visit:  Uncontrolled type 2 diabetes mellitus with hyperglycemia (HCC) -     Microalbumin / creatinine urine ratio  Long term (current) use of oral hypoglycemic drugs  Long-term (current) use of injectable non-insulin  antidiabetic drugs  Pure hypercholesterolemia  Other orders -     Dulaglutide  (TRULICITY ) 1.5 MG/0.5ML SOAJ; Inject 1.5 mg into the skin once a week.    Diabetes Type II complicated by hyperglycemia  History of RYGB Lab Results  Component Value Date   GFR 91.87 08/25/2023   Hba1c goal less than 7, current Hba1c is  Lab Results  Component Value Date   HGBA1C 7.6 (H) 02/20/2024   Will recommend the following: Farxiga  10 mg every day Metformin  XR 500 mg 2 pills bid Trulicity  1.5mg /week  Stopped  Januvia  100 mg every day Not needing PRN Humalog : 3 units if blood sugar is more than 200, 5 units if blood sugar is more than 250, 7 units if blood sugar is more than 300 Ordered libre  Xigduo /libre/DexCom is not covered  Trulicity  was stopped due to pt needing feeding tube to nausea/vomiting after RYGB in 09/2022  No known contraindications/side effects to any of above medications  -Last LD and Tg are as follows: Lab Results  Component Value Date   LDLCALC 84 02/20/2024    Lab Results  Component Value Date   TRIG 112.0 02/20/2024   -On rosuvatstain 5 mg every day -Repeat labs and CMP to watch for AST/ALT -Follow low fat diet and exercise   -Blood pressure goal <140/90 - Microalbumin/creatinine goal is < 30 -Last MA/Cr is as follows: Lab Results  Component Value Date   MICROALBUR 5.7 (H) 05/28/2023   -on ACE/ARB lisinopril  5 mg every day  -diet changes  including salt restriction -limit eating outside -counseled BP targets per standards of diabetes care -uncontrolled blood pressure can lead to retinopathy, nephropathy and cardiovascular and atherosclerotic heart disease  Reviewed and counseled on: -A1C target -Blood sugar targets -Complications of uncontrolled diabetes  -Checking blood sugar before meals and bedtime and bring log next visit -All medications with mechanism of action and side effects -Hypoglycemia management: rule of 15's, Glucagon Emergency Kit and medical alert ID -low-carb low-fat plate-method diet -At least 20 minutes of physical activity per day -Annual dilated retinal eye exam and foot exam -compliance and follow up needs -follow up as scheduled or earlier if problem gets worse  Call if blood sugar is less than 70 or consistently above 250    Take a 15 gm snack of carbohydrate at bedtime before you go to sleep if your blood sugar is less than 100.    If you are going to fast after midnight for a test or procedure, ask your physician for instructions on how to reduce/decrease your insulin  dose.    Call if blood sugar is less than 70 or consistently above 250  -Treating a low sugar by rule of 15  (15 gms of sugar every 15 min until sugar is more than 70) If you feel your sugar is low, test your sugar to be sure If your sugar is low (less than 70), then take 15 grams of a fast acting Carbohydrate (3-4 glucose tablets or glucose gel or 4 ounces of juice  or regular soda) Recheck your sugar 15 min after treating low to make sure it is more than 70 If sugar is still less than 70, treat again with 15 grams of carbohydrate          Don't drive the hour of hypoglycemia  If unconscious/unable to eat or drink by mouth, use glucagon injection or nasal spray baqsimi and call 911. Can repeat again in 15 min if still unconscious.  Return in about 4 weeks (around 04/23/2024) for visit, lab today.   I have reviewed current  medications, nurse's notes, allergies, vital signs, past medical and surgical history, family medical history, and social history for this encounter. Counseled patient on symptoms, examination findings, lab findings, imaging results, treatment decisions and monitoring and prognosis. The patient understood the recommendations and agrees with the treatment plan. All questions regarding treatment plan were fully answered.  Elizabeth Newcomer, MD  03/26/24    History of Present Illness Elizabeth [name] is a 48 y.o. year old female who presents for evaluation of Type II diabetes mellitus.  [name] was first diagnosed in 2009.   Diabetes education +  Home diabetes regimen: Farxiga  5 mg every day Metformin  XR 500 mg bid Trulicity  0.75mg /wk   Stopped Xigduo  2.02/999 mg every day, pt wants tor resume again, was discontinued after RYGB, xigduo  not covered now  Stopped Januvia  100 mg every day  Xigduo  is not covered, DexCom is not covered Trulicity  was stopped due to pt needing feeding tube to nausea/vomiting after RYGB in 09/2022 Did well with xigduo  bid in past  COMPLICATIONS -  MI/Stroke -  retinopathy -  neuropathy -  nephropathy  SYMPTOMS REVIEWED - Polyuria + Weight loss, has RYGB 09/30/22; pre-surgery 316 lb - Blurred vision  BLOOD SUGAR DATA Checks 0-1 time a a day Range: 120-147  Physical Exam  BP 120/80   Pulse 85   Ht 5\' 3"  (1.6 m)   Wt 257 lb (116.6 kg)   SpO2 98%   BMI 45.53 kg/m    Constitutional: well developed, well nourished Head: normocephalic, atraumatic Eyes: sclera anicteric, no redness Neck: supple Lungs: normal respiratory effort Neurology: alert and oriented Skin: dry, no appreciable rashes Musculoskeletal: no appreciable defects Psychiatric: normal mood and affect Diabetic Foot Exam - Simple   No data filed      Current Medications Patient's Medications  New Prescriptions   DULAGLUTIDE  (TRULICITY ) 1.5 MG/0.5ML SOAJ    Inject 1.5 mg  into the skin once a week.  Previous Medications   ACCU-CHEK SOFTCLIX LANCETS LANCETS    4 (four) times daily.   BLOOD GLUCOSE MONITORING SUPPL DEVI    1 each by Does not apply route in the morning, at noon, and at bedtime. May substitute to any manufacturer covered by patient's insurance.   CALCIUM -VITAMIN D  PO    Take 2 tablets by mouth every evening.   CONTINUOUS GLUCOSE SENSOR (DEXCOM G7 SENSOR) MISC    Use to check glucose continuously, change sensor every 10 days   DAPAGLIFLOZIN  PROPANEDIOL (FARXIGA ) 10 MG TABS TABLET    Take 1 tablet (10 mg total) by mouth daily before breakfast.   ESCITALOPRAM  (LEXAPRO ) 20 MG TABLET    Take 1 tablet (20 mg total) by mouth daily.   ESOMEPRAZOLE  (NEXIUM ) 40 MG CAPSULE    Take 1 capsule (40 mg total) by mouth 2 (two) times daily. Open capsule and sprinkle on food at breakfast and dinner for better absorption.   FAMOTIDINE  (PEPCID ) 20 MG TABLET  Take 1 tablet (20 mg total) by mouth 2 (two) times daily.   GABAPENTIN  (NEURONTIN ) 300 MG CAPSULE    Take 1 capsule (300 mg total) by mouth 3 (three) times daily.   HYDROXYZINE  (ATARAX ) 25 MG TABLET    TAKE 2 TABLETS BY MOUTH AT BEDTIME AS NEEDED FOR INSOMNIA   INSULIN  LISPRO (HUMALOG  KWIKPEN) 100 UNIT/ML KWIKPEN    Inject 3-7 Units into the skin 3 (three) times daily. 3 units if blood sugar is more than 200, 5 units if blood sugar is more than 250, 7 units if blood sugar is more than 300   LISINOPRIL  (ZESTRIL ) 5 MG TABLET    TAKE 1 TABLET(5 MG) BY MOUTH DAILY   METFORMIN  (GLUCOPHAGE -XR) 500 MG 24 HR TABLET    Take 2 tablets (1,000 mg total) by mouth 2 (two) times daily with a meal.   METHOCARBAMOL  (ROBAXIN ) 500 MG TABLET    Take 1 tablet (500 mg total) by mouth every 8 (eight) hours as needed for muscle spasms.   METRONIDAZOLE  (FLAGYL ) 500 MG TABLET    Take 1 tablet (500 mg total) by mouth 2 (two) times daily.   MULTIPLE VITAMIN (MULTIVITAMIN WITH MINERALS) TABS TABLET    Take 2 tablets by mouth every evening.    NALOXONE (NARCAN) NASAL SPRAY 4 MG/0.1 ML    Place 1 spray into the nose as needed.   ONDANSETRON  (ZOFRAN -ODT) 4 MG DISINTEGRATING TABLET    DISSOLVE 1 TABLET(4 MG) ON THE TONGUE EVERY 8 HOURS AS NEEDED FOR NAUSEA OR VOMITING   OXYCODONE -ACETAMINOPHEN  (PERCOCET) 7.5-325 MG TABLET    Take 1 tablet by mouth 4 (four) times daily as needed.   ROSUVASTATIN  (CRESTOR ) 5 MG TABLET    TAKE 1 TABLET(5 MG) BY MOUTH DAILY   SPIRONOLACTONE  (ALDACTONE ) 50 MG TABLET    Take 1 tablet (50 mg total) by mouth 2 (two) times daily. Will start with 50 mg twice daily, and increase to 100 mg twice daily as needed  Modified Medications   No medications on file  Discontinued Medications   TRULICITY  0.75 MG/0.5ML SOAJ    ADMINISTER 0.75 MG UNDER THE SKIN 1 TIME A WEEK    Allergies Allergies  Allergen Reactions   Nsaids     Pt reported due to Bariatric surgery 09/2022   Tomato Hives, Itching and Rash    Past Medical History Past Medical History:  Diagnosis Date   Anemia    Anxiety    Astigmatism    Depression    Diabetes mellitus without complication (HCC)    Dry mouth    Fatigue    GERD (gastroesophageal reflux disease)    Heartburn    Hepatic steatosis    Hepatomegaly    High cholesterol    Hypertension    Keratoconjunctivitis    Liver disease    Muscle pain    Nausea and vomiting 04/21/2023   Ovarian cyst    Positive ANA (antinuclear antibody)    Pulmonary nodules    Stomach ulcer    Vitamin D  deficiency    Weakness     Past Surgical History Past Surgical History:  Procedure Laterality Date   BIOPSY  01/18/2021   Procedure: BIOPSY;  Surgeon: Annis Kinder, DO;  Location: [facility];  Service: Gastroenterology;;   BIOPSY  10/28/2023   Procedure: BIOPSY;  Surgeon: Junie Olds, MD;  Location: [facility];  Service: General;;   CHOLECYSTECTOMY N/A 05/11/2021   Procedure: LAPAROSCOPIC CHOLECYSTECTOMY WITH INTRAOPERATIVE CHOLANGIOGRAM;  Surgeon: Teddie Favre  J, MD;   Location: [facility];  Service: General;  Laterality: N/A;   DILATION AND CURETTAGE OF UTERUS     ENDOMETRIAL BIOPSY  12/19/2017   normal per pt   ESOPHAGOGASTRODUODENOSCOPY N/A 10/28/2023   Procedure: UPPER ENDOSCOPY;  Surgeon: Junie Olds, MD;  Location: [facility];  Service: General;  Laterality: N/A;   ESOPHAGOGASTRODUODENOSCOPY (EGD) WITH PROPOFOL  N/A 01/18/2021   Procedure: ESOPHAGOGASTRODUODENOSCOPY (EGD) WITH PROPOFOL ;  Surgeon: Annis Kinder, DO;  Location: [facility];  Service: Gastroenterology;  Laterality: N/A;   HIATAL HERNIA REPAIR N/A 09/30/2022   Procedure: HERNIA REPAIR HIATAL;  Surgeon: Junie Olds, MD;  Location: [facility];  Service: General;  Laterality: N/A;   LAPAROSCOPIC INSERTION GASTROSTOMY TUBE N/A 12/02/2022   Procedure: LAPAROSCOPIC REMNANT GASTROSTOMY G TUBE;  Surgeon: Junie Olds, MD;  Location: [facility];  Service: General;  Laterality: N/A;   LAPAROSCOPIC ROUX-EN-Y GASTRIC BYPASS WITH HIATAL HERNIA REPAIR  09/30/2022   LAPAROSCOPY N/A 12/02/2022   Procedure: LAPAROSCOPY DIAGNOSTIC;  Surgeon: Junie Olds, MD;  Location: [facility];  Service: General;  Laterality: N/A;   TONSILLECTOMY     TUBAL LIGATION     UPPER GI ENDOSCOPY N/A 09/30/2022   Procedure: UPPER GI ENDOSCOPY;  Surgeon: Junie Olds, MD;  Location: [facility];  Service: General;  Laterality: N/A;   UPPER GI ENDOSCOPY N/A 12/02/2022   Procedure: UPPER ENDOSCOPY;  Surgeon: Junie Olds, MD;  Location: [facility];  Service: General;  Laterality: N/A;    Family History family history includes Alcohol abuse in her father; Diabetes in her mother; Drug abuse in her father; HIV in her father; Hyperlipidemia in her mother; Hypertension in her mother; Lupus in her maternal aunt; Obesity in her mother; Ovarian cancer in her maternal grandmother; Peripheral Artery Disease in her mother.  Social History Social History   Socioeconomic History   Marital status: Widowed     Spouse name: Sylvester Evert   Number of children: 2   Years of education: Not on file   Highest education level: Associate degree: occupational, Scientist, product/process development, or vocational program  Occupational History   Occupation: CMA - Geophysicist/field seismologist: [place]  Tobacco Use   Smoking status: Former    Current packs/day: 0.00    Average packs/day: 0.5 packs/day for 3.0 years (1.5 ttl pk-yrs)    Types: Cigarettes    Start date: 08/21/2018    Quit date: 08/21/2021    Years since quitting: 2.5    Passive exposure: Never   Smokeless tobacco: Never   Tobacco comments:    4-5/day  Vaping Use   Vaping status: Never Used  Substance and Sexual Activity   Alcohol use: No   Drug use: No   Sexual activity: Yes    Partners: Male    Birth control/protection: Surgical, Implant  Other Topics Concern   Not on file  Social History Narrative    2 children   1997- son Orson Blalock   2000- son Designer, jewellery in [place] [facility]- CMA in [place]   Enjoys reading   Widowed July 27, 2024, husband died from COVID-19.   Social Drivers of Health   Financial Resource Strain: Medium Risk (01/14/2024)   Overall Financial Resource Strain (CARDIA)    Difficulty of Paying Living Expenses: Somewhat hard  Food Insecurity: No Food Insecurity (01/14/2024)   Hunger Vital Sign    Worried About Running Out of Food in the Last Year: Never true    Ran Out of Food  in the Last Year: Never true  Transportation Needs: No Transportation Needs (01/14/2024)   PRAPARE - Administrator, Civil Service (Medical): No    Lack of Transportation (Non-Medical): No  Physical Activity: Unknown (01/14/2024)   Exercise Vital Sign    Days of Exercise per Week: 0 days    Minutes of Exercise per Session: Not on file  Recent Concern: Physical Activity - Inactive (01/14/2024)   Exercise Vital Sign    Days of Exercise per Week: 0 days    Minutes of Exercise per Session: 10 min  Stress: Stress Concern Present (01/14/2024)   [employer] of Occupational Health - Occupational Stress Questionnaire    Feeling of Stress : Rather much  Social Connections: Moderately Isolated (01/14/2024)   Social Connection and Isolation Panel [NHANES]    Frequency of Communication with Friends and Family: Three times a week    Frequency of Social Gatherings with Friends and Family: Never    Attends Religious Services: 1 to 4 times per year    Active Member of [employer] or Organizations: No    Attends Banker Meetings: Not on file    Marital Status: Widowed  Intimate Partner Violence: Not At Risk (12/02/2022)   Humiliation, Afraid, Rape, and Kick questionnaire    Fear of Current or Ex-Partner: No    Emotionally Abused: No    Physically Abused: No    Sexually Abused: No    Lab Results  Component Value Date   HGBA1C 7.6 (H) 02/20/2024   HGBA1C 9.0 (H) 09/26/2023   HGBA1C 8.7 (H) 05/28/2023   Lab Results  Component Value Date   CHOL 158 02/20/2024   Lab Results  Component Value Date   HDL 51.60 02/20/2024   Lab Results  Component Value Date   LDLCALC 84 02/20/2024   Lab Results  Component Value Date   TRIG 112.0 02/20/2024   Lab Results  Component Value Date   CHOLHDL 3 02/20/2024   Lab Results  Component Value Date   CREATININE 0.69 12/09/2023   Lab Results  Component Value Date   GFR 91.87 08/25/2023   Lab Results  Component Value Date   MICROALBUR 5.7 (H) 05/28/2023      Component Value Date/Time   NA 137 12/09/2023 1319   NA 137 02/27/2022 0000   K 4.0 12/09/2023 1319   CL 104 12/09/2023 1319   CO2 23 12/09/2023 1319   GLUCOSE 143 (H) 12/09/2023 1319   BUN 17 12/09/2023 1319   BUN 7 12/22/2018 1430   CREATININE 0.69 12/09/2023 1319   CREATININE 0.67 09/26/2023 0809   CALCIUM  9.2 12/09/2023 1319   PROT 7.8 12/09/2023 1319   PROT 6.8 12/22/2018 1430   ALBUMIN 3.8 12/09/2023 1319   ALBUMIN 4.3 12/22/2018 1430   AST 23 12/09/2023 1319   ALT 32 12/09/2023 1319   ALKPHOS 80 12/09/2023  1319   BILITOT 0.5 12/09/2023 1319   BILITOT 0.3 12/22/2018 1430   GFRNONAA >60 12/09/2023 1319   GFRAA 133 12/22/2018 1430      Latest Ref Rng & Units 12/09/2023    1:19 PM 09/26/2023    8:09 AM 08/25/2023   10:39 AM  BMP  Glucose 70 - 99 mg/dL 161  096  045   BUN 6 - 20 mg/dL 17  11  10    Creatinine 0.44 - 1.00 mg/dL 4.09  8.11  9.14   BUN/Creat Ratio 6 - 22 (calc)  SEE NOTE:    Sodium  135 - 145 mmol/L 137  139  138   Potassium 3.5 - 5.1 mmol/L 4.0  4.4  4.4   Chloride 98 - 111 mmol/L 104  104  103   CO2 22 - 32 mmol/L 23  24  26    Calcium  8.9 - 10.3 mg/dL 9.2  9.4  9.2        Component Value Date/Time   WBC 8.0 12/09/2023 1319   RBC 5.54 (H) 12/09/2023 1319   HGB 12.9 12/09/2023 1319   HGB 13.3 12/22/2018 1430   HCT 41.5 12/09/2023 1319   HCT 43.4 12/22/2018 1430   PLT 325 12/09/2023 1319   PLT 301 12/22/2018 1430   MCV 74.9 (L) 12/09/2023 1319   MCV 73 (L) 12/22/2018 1430   MCH 23.3 (L) 12/09/2023 1319   MCHC 31.1 12/09/2023 1319   RDW 15.2 12/09/2023 1319   RDW 15.9 (H) 12/22/2018 1430   LYMPHSABS 2.1 11/24/2022 0305   LYMPHSABS 2.1 12/22/2018 1430   MONOABS 0.4 11/24/2022 0305   EOSABS 0.1 11/24/2022 0305   EOSABS 0.1 12/22/2018 1430   BASOSABS 0.0 11/24/2022 0305   BASOSABS 0.1 12/22/2018 1430     Parts of this note may have been dictated using voice recognition software. There may be variances in spelling and vocabulary which are unintentional. Not all errors are proofread. Please notify the [facility] if any discrepancies are noted or if the meaning of any statement is not clear.

## 2024-03-26 NOTE — Patient Instructions (Signed)

## 2024-03-27 LAB — MICROALBUMIN / CREATININE URINE RATIO
Creatinine, Urine: 425 mg/dL — ABNORMAL HIGH (ref 20–275)
Microalb Creat Ratio: 43 mg/g{creat} — ABNORMAL HIGH (ref <30)
Microalb, Ur: 18.1 mg/dL

## 2024-03-29 NOTE — Therapy (Incomplete)
 OUTPATIENT PHYSICAL THERAPY SHOULDER EVALUATION   Patient Name: Elizabeth [name] MRN: [id number] DOB:12/18/1975, 48 y.o., female Today's Date: 03/29/2024  END OF SESSION:    Past Medical History:  Diagnosis Date   Anemia    Anxiety    Astigmatism    Depression    Diabetes mellitus without complication (HCC)    Dry mouth    Fatigue    GERD (gastroesophageal reflux disease)    Heartburn    Hepatic steatosis    Hepatomegaly    High cholesterol    Hypertension    Keratoconjunctivitis    Liver disease    Muscle pain    Nausea and vomiting 04/21/2023   Ovarian cyst    Positive ANA (antinuclear antibody)    Pulmonary nodules    Stomach ulcer    Vitamin D  deficiency    Weakness    Past Surgical History:  Procedure Laterality Date   BIOPSY  01/18/2021   Procedure: BIOPSY;  Surgeon: Annis Kinder, DO;  Location: [facility];  Service: Gastroenterology;;   BIOPSY  10/28/2023   Procedure: BIOPSY;  Surgeon: Junie Olds, MD;  Location: [facility];  Service: General;;   CHOLECYSTECTOMY N/A 05/11/2021   Procedure: LAPAROSCOPIC CHOLECYSTECTOMY WITH INTRAOPERATIVE CHOLANGIOGRAM;  Surgeon: Junie Olds, MD;  Location: [facility];  Service: General;  Laterality: N/A;   DILATION AND CURETTAGE OF UTERUS     ENDOMETRIAL BIOPSY  12/19/2017   normal per pt   ESOPHAGOGASTRODUODENOSCOPY N/A 10/28/2023   Procedure: UPPER ENDOSCOPY;  Surgeon: Junie Olds, MD;  Location: [facility];  Service: General;  Laterality: N/A;   ESOPHAGOGASTRODUODENOSCOPY (EGD) WITH PROPOFOL  N/A 01/18/2021   Procedure: ESOPHAGOGASTRODUODENOSCOPY (EGD) WITH PROPOFOL ;  Surgeon: Annis Kinder, DO;  Location: [facility];  Service: Gastroenterology;  Laterality: N/A;   HIATAL HERNIA REPAIR N/A 09/30/2022   Procedure: HERNIA REPAIR HIATAL;  Surgeon: Junie Olds, MD;  Location: [facility];  Service: General;  Laterality: N/A;   LAPAROSCOPIC INSERTION GASTROSTOMY TUBE N/A 12/02/2022    Procedure: LAPAROSCOPIC REMNANT GASTROSTOMY G TUBE;  Surgeon: Junie Olds, MD;  Location: [facility];  Service: General;  Laterality: N/A;   LAPAROSCOPIC ROUX-EN-Y GASTRIC BYPASS WITH HIATAL HERNIA REPAIR  09/30/2022   LAPAROSCOPY N/A 12/02/2022   Procedure: LAPAROSCOPY DIAGNOSTIC;  Surgeon: Junie Olds, MD;  Location: [facility];  Service: General;  Laterality: N/A;   TONSILLECTOMY     TUBAL LIGATION     UPPER GI ENDOSCOPY N/A 09/30/2022   Procedure: UPPER GI ENDOSCOPY;  Surgeon: Junie Olds, MD;  Location: [facility];  Service: General;  Laterality: N/A;   UPPER GI ENDOSCOPY N/A 12/02/2022   Procedure: UPPER ENDOSCOPY;  Surgeon: Junie Olds, MD;  Location: [facility];  Service: General;  Laterality: N/A;   Patient Active Problem List   Diagnosis Date Noted   Multiple gastric ulcers 11/17/2023   Insomnia 08/25/2023   Chronic gastritis without bleeding 04/11/2023   Multiple pulmonary nodules 02/21/2023   Bilateral shoulder pain 02/18/2023   History of gastric bypass 12/02/2022   Positive ANA (antinuclear antibody) 11/26/2022   Excessive bleeding in premenopausal period 11/11/2022   Osteoarthritis of knee 03/19/2022   Left knee pain 02/22/2022   Preventative health care 12/31/2021   Drug-induced constipation 12/31/2021   Depression 11/07/2021   Chronic abdominal pain 04/06/2021   Grief reaction 04/06/2021   Gastroesophageal reflux disease without esophagitis    Hyperlipidemia 01/21/2018   Uncontrolled type 2 diabetes mellitus with hyperglycemia (HCC) 01/21/2018   Keratoconjunctivitis  sicca of both eyes not specified as Sjogren's 04/04/2017   Regular astigmatism of both eyes 04/04/2017   Vitamin D  deficiency 12/02/2016   Essential hypertension 02/07/2016   Morbid obesity with BMI of 50.0-59.9, adult (HCC) 02/07/2016    PCP: Dorrene Gaucher, NP  REFERRING PROVIDER: Frazier Jacob, DO  REFERRING DIAG: [phone] (ICD-10-CM) - Tendinopathy of right rotator  cuff   THERAPY DIAG:   No diagnosis found.  RATIONALE FOR EVALUATION AND TREATMENT: Rehabilitation  ONSET DATE: ***2024, worse by Dec  NEXT MD VISIT: 04/28/24   SUBJECTIVE:                                                                                                                                                                                      SUBJECTIVE STATEMENT: ***  ***01/22/24 - R shoulder pain that started off mild sometime in 2024 and gradually became worse by Dec 2024. Pain started to increase in R shoulder and is restricted in movement. Went to doctor and they stated calcification/bursitis in R shoulder and possible arthritis in both shoulders. Doesn't take NSAIDs or steroid injections due to GI complications.  Hand dominance: Right  PERTINENT HISTORY:  DM, GERD, HTN, depression, anxiety, anemia, weakness, muscle pain, morbid obesity  PAIN:  Are you having pain? Yes: NPRS scale: 5/10 currently, 9/10 worst with movement Pain location: All of over shoulder Pain description: Dull, achy Aggravating factors: Moving it in all directions Relieving factors: Heat, muscle relaxer, pain meds (oxycodone )  PRECAUTIONS: None  RED FLAGS: None   WEIGHT BEARING RESTRICTIONS: No  FALLS:  Has patient fallen in last 6 months? No ***  LIVING ENVIRONMENT: Lives with: lives with their family Lives in: House/apartment Stairs: Yes: External: 1 steps; none Has following equipment at home: Single point cane and Walker - 2 wheeled  OCCUPATION: Currently not working  PLOF: Independent and Leisure: Cook, read, and spend time with family  PATIENT GOALS: "To be more mobile with arms"   OBJECTIVE:  Note: Objective measures were completed at Evaluation unless otherwise noted.  DIAGNOSTIC FINDINGS:  02/03/24 - MR R Shoulder IMPRESSION: 1. Chronic calcific tendinosis of the distal footprint of the anterior supraspinatus tendon. 2. Focal tendinosis versus a small/developing,  chronic horizontal linear non fluid bright partial-thickness midsubstance tear of the far anterior supraspinatus tendon. No tendon retraction. 3. Mild tendinosis of the posterior supraspinatus and anterior infraspinatus interdigitating tendon footprint fibers. 4. Mild atrophy and fatty infiltration of the posterosuperior aspect of the teres minor muscle. 5. Mild degenerative changes of the acromioclavicular joint. 6. Mild glenohumeral cartilage degenerative changes.  11/20/23 - DG R Shoulder FINDINGS: There is no evidence of fracture or dislocation.  Slight acromioclavicular spurring. Glenohumeral joint space is normal. No erosive change or focal bone abnormality. There is a 9 mm soft tissue calcification adjacent to the lateral humeral head.   IMPRESSION: A 9 mm soft tissue calcification adjacent to the lateral humerus may represent calcific tendinopathy or bursitis.  PATIENT SURVEYS:  Elizabeth [name]   01/22/24: 79.5/100 = 79.5% COGNITION: Overall cognitive status: Within functional limits for tasks assessed     SENSATION: WFL  POSTURE: Rounded shoulders, forward head, L shoulder slightly higher than R  UPPER EXTREMITY ROM:   Active ROM R 01/22/24 L 01/22/24 R 04/06/24 Eval L 04/06/24 Eval  Shoulder flexion 111 p! 145 p!    Shoulder extension 38 p! Posterior shoulder 45    Shoulder abduction 97 p! 129 p! (Dull)    Shoulder horiz adduction Limited - sharp p!     Shoulder internal rotation 64 p! (Dull)     Shoulder external rotation 23 p! (Dull)     Elbow flexion      Elbow extension      Wrist flexion      Wrist extension      Wrist ulnar deviation      Wrist radial deviation      Wrist pronation      Wrist supination      (Blank rows = not tested, p! = pain)  UPPER EXTREMITY MMT:  MMT R 01/22/24 L 01/22/24 R 04/06/24 Eval L 04/06/24 Eval  Shoulder flexion 2 3+    Shoulder extension 3- 3+ p!    Shoulder abduction 2 p! 3+ dull p!    Shoulder adduction      Shoulder internal rotation 3  p! 2 sharp p!    Shoulder external rotation 2 p! 3 dull p!    Middle trapezius      Lower trapezius      Elbow flexion      Elbow extension      Wrist flexion      Wrist extension      Wrist ulnar deviation      Wrist radial deviation      Wrist pronation      Wrist supination      Grip strength (lbs)      (Blank rows = not tested)  SHOULDER SPECIAL TESTS:  Impingement tests: Hawkins/Kennedy impingement test: positive  Rotator cuff assessment: Empty can test: positive   01/22/24: Impingement tests: Hawkins/Kennedy impingement test: positive  Rotator cuff assessment: Empty can test: positive   PALPATION:  ***Sharp pain with palpation on anterior and lateral aspect of R shoulder                                                                                                                             TREATMENT DATE:   03/29/2024 - Eval SELF CARE:  Reviewed eval findings and role of PT in addressing identified deficits as well as instruction in initial HEP (see below).   01/22/2024 -  Eval THERAPEUTIC EXERCISE: To improve endurance, ROM, and flexibility.  Demonstration, verbal and tactile cues throughout for technique. Shoulder Rolls in sitting x 10 Doorway Pec Stretch at 60 Elevation 2 x 30' Circular Shoulder Pendulum w/ Table Support x 10 - discomfort when moving only arm, told to move hips side to side to create motion in arm   PATIENT EDUCATION:  Education details: {Education details:27468}  Person educated: {Person educated:25204} Education method: {Education Method ZO:10960} Education comprehension: {Education Comprehension:25206}   HOME EXERCISE PROGRAM: Access Code: [email or web address] URL: [email or web address] Date: 01/22/2024 Prepared by: Talia Joseph-Greene  Exercises - Shoulder Rolls in Sitting  - 2 x daily - 7 x weekly - 2 sets - 10 reps - Doorway Pec Stretch at 60 Elevation  - 2 x daily - 7 x weekly - 5 reps - 30 sec hold - Circular Shoulder Pendulum  with Table Support  - 3 x daily - 7 x weekly - 2 sets - 10 reps  ASSESSMENT:  CLINICAL IMPRESSION: Elizabeth [name] is a 48 y.o. female who was referred to physical therapy for evaluation and treatment for tendinopathy of R rotator cuff.  She completed a previous PT eval on 01/22/2024, however did not return for any follow-up visits.  Patient reports onset of *** pain beginning ***. Pain is worse with ***.  Patient has deficits in *** ROM, *** flexibility, *** strength, ***abnormal posture, and TTP with abnormal muscle tension *** which are interfering with ADLs and are impacting quality of life.  On QuickDASH patient scored ***/100 demonstrating ***% disability.  Elizabeth [name] will benefit from skilled PT to address above deficits to improve mobility and activity tolerance with decreased pain interference.   01/22/24 EVAL: Elizabeth [name] is a 48 y.o. female who was seen today for physical therapy evaluation and treatment for R shoulder pain due to Calcific tendinitis. R shoulder pain began sometime in 2024 that initially was mild but started to become increasingly worse by Dec 2024. She shows limitations due to pain in all planes of motion in R shoulder and shows some limitations in L shoulder but displayed better ROM in L>R. Elizabeth [name] was able to lift R arm at least 50% but the pain was too sharp, therefore, we were unable to conduct resisted MMT on that side.This impacts her ability to do ADLs, sleep and spend time with her family. Mentioned that she had to quit working due to other existing co-morbidities like GI complications. She does have AD in the house that she can use if she needs to. Elizabeth [name] will benefit from skilled PT to address above deficits and limitations to improve mobility and activity tolerance with decreased pain interference.   OBJECTIVE IMPAIRMENTS: decreased activity tolerance, decreased endurance, decreased knowledge of condition, decreased mobility, decreased ROM, decreased strength, increased  fascial restrictions, impaired perceived functional ability, increased muscle spasms, impaired flexibility, impaired sensation, impaired tone, impaired UE functional use, improper body mechanics, postural dysfunction, and pain.   ACTIVITY LIMITATIONS: carrying, lifting, sleeping, bathing, dressing, reach over head, and hygiene/grooming  PARTICIPATION LIMITATIONS: meal prep, cleaning, laundry, shopping, community activity, and occupation  PERSONAL FACTORS: Fitness, Past/current experiences, Time since onset of injury/illness/exacerbation, and 3+ comorbidities: DM, GERD, HTN, depression, anxiety, anemia, weakness, muscle pain, morbid obesityare also affecting patient's functional outcome.   REHAB POTENTIAL: Good  CLINICAL DECISION MAKING: Unstable/unpredictable  EVALUATION COMPLEXITY: High   GOALS: Goals reviewed with patient? {yes/no:20286}  SHORT TERM GOALS: Target date: ***  Patient will be independent with initial HEP to improve outcomes and carryover.  Baseline: ***  Goal status: {GOALSTATUS:25110}  2.  Patient will report 25% improvement in R shoulder pain to improve QOL.   Baseline: *** Goal status: {GOALSTATUS:25110}  3.  Patient will be able to increase R shoulder ROM 10-15 w/ decreased pain for functional UE use. Baseline: Refer to above UE ROM table Goal status: {GOALSTATUS:25110}  LONG TERM GOALS: Target date: ***  Patient will be independent with ongoing/advanced HEP for self-management at home.  Baseline: *** Goal status: {GOALSTATUS:25110}  2.  Patient will report 50-75% improvement in R shoulder pain to improve QOL.  Baseline: *** Goal status: {GOALSTATUS:25110}  3.  Patient to demonstrate improved upright posture with posterior shoulder girdle engaged to promote improved glenohumeral joint mobility. Baseline: *** Goal status: {GOALSTATUS:25110}  4.  Patient to improve R shoulder AROM to {Functional status:27472} without pain provocation to allow for  increased ease of ADLs.  Baseline: Refer to above UE ROM table Goal status: {GOALSTATUS:25110}  5.  Patient will demonstrate improved *** strength to >/= ***/5 for functional UE use. Baseline: Refer to above UE MMT table Goal status: {GOALSTATUS:25110}  6  Patient will report </= ***% on QuickDASH (MCID = 14%) to demonstrate improved functional ability.  Baseline: *** Goal status: {GOALSTATUS:25110}  7.  Patient will report the ability to sleep for at least 4-5 hours w/o increased pain.   Baseline: *** Severe difficulty to sleep - QuickDASH Goal status: {GOALSTATUS:25110}   8. Pt will be able to do ADLs and household related tasks w/o limitations due to R shoulder pain, ROM, and weakness Baseline: *** Unable to do ADLs/household chores - QuickDASH Goal status: {GOALSTATUS:25110}   PLAN:  PT FREQUENCY: 1x/week *** Per Dr. Lovenia Ruby  PT DURATION: 8 weeks  PLANNED INTERVENTIONS: 16109- PT Re-evaluation, 97750- Physical Performance Testing, 97110-Therapeutic exercises, 97530- Therapeutic activity, [id number]- Neuromuscular re-education, 97535- Self Care, 60454- Manual therapy, [id number]- Aquatic Therapy, U9811- Electrical stimulation (unattended), 97016- Vasopneumatic device, [id number]- Ultrasound, [id number]- Ionotophoresis 4mg /ml Dexamethasone , 20560 (1-2 muscles), 20561 (3+ muscles)- Dry Needling, Patient/Family education, Taping, Joint mobilization, Cryotherapy, and Moist heat  PLAN FOR NEXT SESSION: Review initial HEP, postural correction/scapular strengthening, introduce flexibility/ROM & UE strengthening exercises - based on pt tolerance   Francisco Irving, PT 03/29/2024, 3:14 PM

## 2024-03-30 ENCOUNTER — Other Ambulatory Visit: Payer: Self-pay | Admitting: *Deleted

## 2024-03-30 ENCOUNTER — Ambulatory Visit: Payer: Self-pay | Admitting: "Endocrinology

## 2024-03-30 MED ORDER — METHOCARBAMOL 500 MG PO TABS: 500.0000 mg | ORAL_TABLET | Freq: Three times a day (TID) | ORAL | 0 refills | Status: DC | PRN

## 2024-03-31 ENCOUNTER — Encounter: Payer: Self-pay | Admitting: Obstetrics and Gynecology

## 2024-04-01 ENCOUNTER — Ambulatory Visit

## 2024-04-01 ENCOUNTER — Other Ambulatory Visit (HOSPITAL_COMMUNITY)
Admission: RE | Admit: 2024-04-01 | Discharge: 2024-04-01 | Disposition: A | Discharge status: Home / Self Care | Source: Ambulatory Visit | Attending: Obstetrics and Gynecology | Admitting: Obstetrics and Gynecology

## 2024-04-01 VITALS — BP 130/86 | HR 92 | Ht 63.0 in | Wt 254.0 lb

## 2024-04-01 DIAGNOSIS — N898 Other specified noninflammatory disorders of vagina: Secondary | ICD-10-CM | POA: Insufficient documentation

## 2024-04-01 DIAGNOSIS — R350 Frequency of micturition: Secondary | ICD-10-CM | POA: Diagnosis not present

## 2024-04-01 LAB — POCT URINALYSIS DIPSTICK
Bilirubin, UA: NEGATIVE
Blood, UA: NEGATIVE
Glucose, UA: POSITIVE — AB
Ketones, UA: POSITIVE
Leukocytes, UA: NEGATIVE
Nitrite, UA: NEGATIVE
Protein, UA: NEGATIVE
Spec Grav, UA: 1.015 (ref 1.010–1.025)
Urobilinogen, UA: 0.2 U/dL
pH, UA: 7 (ref 5.0–8.0)

## 2024-04-01 NOTE — Progress Notes (Signed)
.  SUBJECTIVE:  48 y.o. female complains of clear vaginal discharge with odor for x 1 month, some pelvic and back pain, urinary frequency. Denies abnormal vaginal bleeding or fever. No UTI symptoms. Denies history of known exposure to STD.  No LMP recorded. (Menstrual status: IUD).  OBJECTIVE:  She appears well, afebrile. Urine dipstick: positive for glucose and positive for ketones.  ASSESSMENT:  Vaginal Discharge  Vaginal Odor   PLAN:  BVAG, CVAG probe and urine culture sent to lab. Treatment: To be determined once lab results are received ROV prn if symptoms persist or worsen.  Evelia Hipp, RN

## 2024-04-02 ENCOUNTER — Telehealth: Payer: Self-pay

## 2024-04-02 ENCOUNTER — Ambulatory Visit (INDEPENDENT_AMBULATORY_CARE_PROVIDER_SITE_OTHER): Admitting: Family

## 2024-04-02 ENCOUNTER — Telehealth: Payer: Self-pay | Admitting: Family

## 2024-04-02 VITALS — BP 105/74 | HR 94 | Temp 98.2°F | Resp 16 | Ht 63.0 in | Wt 254.0 lb

## 2024-04-02 DIAGNOSIS — F32A Depression, unspecified: Secondary | ICD-10-CM

## 2024-04-02 DIAGNOSIS — E1165 Type 2 diabetes mellitus with hyperglycemia: Secondary | ICD-10-CM

## 2024-04-02 DIAGNOSIS — Z7984 Long term (current) use of oral hypoglycemic drugs: Secondary | ICD-10-CM

## 2024-04-02 DIAGNOSIS — E559 Vitamin D deficiency, unspecified: Secondary | ICD-10-CM | POA: Diagnosis not present

## 2024-04-02 DIAGNOSIS — G4700 Insomnia, unspecified: Secondary | ICD-10-CM

## 2024-04-02 DIAGNOSIS — R918 Other nonspecific abnormal finding of lung field: Secondary | ICD-10-CM

## 2024-04-02 DIAGNOSIS — E785 Hyperlipidemia, unspecified: Secondary | ICD-10-CM

## 2024-04-02 DIAGNOSIS — I10 Essential (primary) hypertension: Secondary | ICD-10-CM | POA: Diagnosis not present

## 2024-04-02 DIAGNOSIS — Z7985 Long-term (current) use of injectable non-insulin antidiabetic drugs: Secondary | ICD-10-CM

## 2024-04-02 DIAGNOSIS — K219 Gastro-esophageal reflux disease without esophagitis: Secondary | ICD-10-CM

## 2024-04-02 DIAGNOSIS — R768 Other specified abnormal immunological findings in serum: Secondary | ICD-10-CM | POA: Diagnosis not present

## 2024-04-02 DIAGNOSIS — M179 Osteoarthritis of knee, unspecified: Secondary | ICD-10-CM

## 2024-04-02 LAB — CERVICOVAGINAL ANCILLARY ONLY
Bacterial Vaginitis (gardnerella): POSITIVE — AB
Candida Glabrata: NEGATIVE
Candida Vaginitis: NEGATIVE
Comment: NEGATIVE
Comment: NEGATIVE
Comment: NEGATIVE

## 2024-04-02 MED ORDER — ESCITALOPRAM OXALATE 20 MG PO TABS
20.0000 mg | ORAL_TABLET | Freq: Every day | ORAL | 1 refills | Status: DC
Start: 2024-04-02 — End: 2024-04-19

## 2024-04-02 MED ORDER — ROSUVASTATIN CALCIUM 5 MG PO TABS: 5.0000 mg | ORAL_TABLET | Freq: Every day | ORAL | 1 refills | Status: DC

## 2024-04-02 MED ORDER — TRAZODONE HCL 50 MG PO TABS: 25.0000 mg | ORAL_TABLET | Freq: Every evening | ORAL | 3 refills | Status: DC | PRN

## 2024-04-02 MED ORDER — FAMOTIDINE 20 MG PO TABS: 20.0000 mg | ORAL_TABLET | Freq: Two times a day (BID) | ORAL | 1 refills | Status: DC

## 2024-04-02 NOTE — Telephone Encounter (Signed)
 I called patient to schedule surgery with Dr. Vallarie Gauze on 05/10/24 at 10:30 am at [facility]. Patient agreed to surgery date and times. I provided pre-op instructions and surgery details over the phone.

## 2024-04-02 NOTE — Assessment & Plan Note (Signed)
 Vit D normal in outside system. Continue calcium  + D supplement.

## 2024-04-02 NOTE — Assessment & Plan Note (Signed)
 Saw Dr. Dimple Casey and work up was not felt to be consistent with autoimmune disease.

## 2024-04-02 NOTE — Assessment & Plan Note (Signed)
 Uncontrolled on hydroxyzine , wishes to retry trazodone .

## 2024-04-02 NOTE — Assessment & Plan Note (Signed)
 BP Readings from Last 3 Encounters:  04/02/24 105/74  04/01/24 130/86  03/26/24 120/80   Stable on lisinopril  and aldactone .

## 2024-04-02 NOTE — Progress Notes (Signed)
 Subjective:     Patient ID: Elizabeth [name], female    DOB: 02/25/1976, 48 y.o.   MRN: [id number]  Chief Complaint  Patient presents with   Hypertension    Here for follow up   Depression    Here for follow up   Diabetes    Here for follow up    Hypertension  Depression       Diabetes    Discussed the use of AI scribe software for clinical note transcription with the patient, who gave verbal consent to proceed.  History of Present Illness   Patient is a 48 year old female with diabetes who presents for follow-up on her diabetes management and other ongoing health issues.  Blood sugar levels are generally well-controlled with recent readings of 121 and 128, and the highest around 147-148. She is on Farxiga  and Trulicity , with a recent dose increase to 1.5 mg. Insulin  is used if sugar exceeds 200.  She is scheduled for a hysterectomy next month and has a recent bacterial vaginosis infection, confirmed positive again.  She has ongoing stomach issues post-bariatric surgery and is under specialist care. She is on oxycodone  per pain management which she feels is helpful.  Arthritis in her knees is manageable except during rainy weather. She has resumed physical therapy for her shoulders post-surgery.  Hydroxyzine  for sleep is less effective now. Trazodone  was helpful in the past. Lexapro  is beneficial for mood, and she sees a therapist biweekly.  Lisinopril  and Aldactone  effectively manage blood pressure.  GYN actually started the aldactone  for hirsuitism which she is finding helpful. Crestor  controls cholesterol well. She experiences gradual weight loss and struggles with fluid intake.  She experiences acid reflux and takes Nexium  and Pepcid , with persistent symptoms awaiting further evaluation.  Zofran  remains helpful for nausea.    Health Maintenance Due  Topic Date Due   HPV VACCINES (1 - 3-dose series) Never done   OPHTHALMOLOGY EXAM  12/12/2022   COVID-19 Vaccine  (4 - 2024-25 season) 06/22/2023    Past Medical History:  Diagnosis Date   Anemia    Anxiety    Astigmatism    Depression    Diabetes mellitus without complication (HCC)    Dry mouth    Fatigue    GERD (gastroesophageal reflux disease)    Heartburn    Hepatic steatosis    Hepatomegaly    High cholesterol    Hypertension    Keratoconjunctivitis    Liver disease    Muscle pain    Nausea and vomiting 04/21/2023   Ovarian cyst    Positive ANA (antinuclear antibody)    Pulmonary nodules    Stomach ulcer    Vitamin D  deficiency    Weakness     Past Surgical History:  Procedure Laterality Date   BIOPSY  01/18/2021   Procedure: BIOPSY;  Surgeon: Annis Kinder, DO;  Location: [facility];  Service: Gastroenterology;;   BIOPSY  10/28/2023   Procedure: BIOPSY;  Surgeon: Junie Olds, MD;  Location: [facility];  Service: General;;   CHOLECYSTECTOMY N/A 05/11/2021   Procedure: LAPAROSCOPIC CHOLECYSTECTOMY WITH INTRAOPERATIVE CHOLANGIOGRAM;  Surgeon: Junie Olds, MD;  Location: [facility];  Service: General;  Laterality: N/A;   DILATION AND CURETTAGE OF UTERUS     ENDOMETRIAL BIOPSY  12/19/2017   normal per pt   ESOPHAGOGASTRODUODENOSCOPY N/A 10/28/2023   Procedure: UPPER ENDOSCOPY;  Surgeon: Junie Olds, MD;  Location: [facility];  Service: General;  Laterality: N/A;  ESOPHAGOGASTRODUODENOSCOPY (EGD) WITH PROPOFOL  N/A 01/18/2021   Procedure: ESOPHAGOGASTRODUODENOSCOPY (EGD) WITH PROPOFOL ;  Surgeon: Annis Kinder, DO;  Location: [facility];  Service: Gastroenterology;  Laterality: N/A;   HIATAL HERNIA REPAIR N/A 09/30/2022   Procedure: HERNIA REPAIR HIATAL;  Surgeon: Junie Olds, MD;  Location: [facility];  Service: General;  Laterality: N/A;   LAPAROSCOPIC INSERTION GASTROSTOMY TUBE N/A 12/02/2022   Procedure: LAPAROSCOPIC REMNANT GASTROSTOMY G TUBE;  Surgeon: Junie Olds, MD;  Location: [facility];  Service: General;  Laterality: N/A;    LAPAROSCOPIC ROUX-EN-Y GASTRIC BYPASS WITH HIATAL HERNIA REPAIR  09/30/2022   LAPAROSCOPY N/A 12/02/2022   Procedure: LAPAROSCOPY DIAGNOSTIC;  Surgeon: Junie Olds, MD;  Location: [facility];  Service: General;  Laterality: N/A;   TONSILLECTOMY     TUBAL LIGATION     UPPER GI ENDOSCOPY N/A 09/30/2022   Procedure: UPPER GI ENDOSCOPY;  Surgeon: Junie Olds, MD;  Location: [facility];  Service: General;  Laterality: N/A;   UPPER GI ENDOSCOPY N/A 12/02/2022   Procedure: UPPER ENDOSCOPY;  Surgeon: Junie Olds, MD;  Location: [facility];  Service: General;  Laterality: N/A;    Family History  Problem Relation Age of Onset   Diabetes Mother    Hypertension Mother    Hyperlipidemia Mother    Obesity Mother    Peripheral Artery Disease Mother    HIV Father        died from complications (IVDU)   Alcohol abuse Father    Drug abuse Father    Lupus Maternal Aunt    Ovarian cancer Maternal Grandmother    Colon cancer Neg Hx    Esophageal cancer Neg Hx    Liver disease Neg Hx    Pancreatic cancer Neg Hx    Stomach cancer Neg Hx    Sleep apnea Neg Hx    Rectal cancer Neg Hx     Social History   Socioeconomic History   Marital status: Widowed    Spouse name: Sylvester Evert   Number of children: 2   Years of education: Not on file   Highest education level: Associate degree: occupational, Scientist, product/process development, or vocational program  Occupational History   Occupation: CMA - Geophysicist/field seismologist: [employer]  Tobacco Use   Smoking status: Former    Current packs/day: 0.00    Average packs/day: 0.5 packs/day for 3.0 years (1.5 ttl pk-yrs)    Types: Cigarettes    Start date: 08/21/2018    Quit date: 08/21/2021    Years since quitting: 2.6    Passive exposure: Never   Smokeless tobacco: Never   Tobacco comments:    4-5/day  Vaping Use   Vaping status: Never Used  Substance and Sexual Activity   Alcohol use: No   Drug use: No   Sexual activity: Yes    Partners: Male     Birth control/protection: Surgical, Implant  Other Topics Concern   Not on file  Social History Narrative    2 children   1997- son Orson Blalock   2000- son Designer, jewellery in [employer] [facility]- CMA in [place]   Enjoys reading   Widowed 19-Jul-2024, husband died from COVID-19.   Social Drivers of Health   Financial Resource Strain: High Risk (04/01/2024)   Overall Financial Resource Strain (CARDIA)    Difficulty of Paying Living Expenses: Very hard  Food Insecurity: No Food Insecurity (04/01/2024)   Hunger Vital Sign    Worried About Running Out of Food in  the Last Year: Never true    Ran Out of Food in the Last Year: Never true  Transportation Needs: No Transportation Needs (04/01/2024)   PRAPARE - Administrator, Civil Service (Medical): No    Lack of Transportation (Non-Medical): No  Physical Activity: Inactive (04/01/2024)   Exercise Vital Sign    Days of Exercise per Week: 0 days    Minutes of Exercise per Session: Not on file  Stress: Stress Concern Present (04/01/2024)   [employer] of Occupational Health - Occupational Stress Questionnaire    Feeling of Stress: Rather much  Social Connections: Moderately Integrated (04/01/2024)   Social Connection and Isolation Panel    Frequency of Communication with Friends and Family: More than three times a week    Frequency of Social Gatherings with Friends and Family: Never    Attends Religious Services: 1 to 4 times per year    Active Member of [employer] or Organizations: Yes    Attends Banker Meetings: 1 to 4 times per year    Marital Status: Widowed  Recent Concern: Social Connections - Moderately Isolated (01/14/2024)   Social Connection and Isolation Panel    Frequency of Communication with Friends and Family: Three times a week    Frequency of Social Gatherings with Friends and Family: Never    Attends Religious Services: 1 to 4 times per year    Active Member of [employer] or Organizations: No    Attends Occupational hygienist Meetings: Not on file    Marital Status: Widowed  Intimate Partner Violence: Not At Risk (12/02/2022)   Humiliation, Afraid, Rape, and Kick questionnaire    Fear of Current or Ex-Partner: No    Emotionally Abused: No    Physically Abused: No    Sexually Abused: No    Outpatient Medications Prior to Visit  Medication Sig Dispense Refill   Accu-Chek Softclix Lancets lancets 4 (four) times daily.     Blood Glucose Monitoring Suppl DEVI 1 each by Does not apply route in the morning, at noon, and at bedtime. May substitute to any manufacturer covered by patient's insurance. 1 each 0   CALCIUM -VITAMIN D  PO Take 2 tablets by mouth every evening.     dapagliflozin  propanediol (FARXIGA ) 10 MG TABS tablet Take 1 tablet (10 mg total) by mouth daily before breakfast. 90 tablet 1   Dulaglutide  (TRULICITY ) 1.5 MG/0.5ML SOAJ Inject 1.5 mg into the skin once a week. 2 mL 0   esomeprazole  (NEXIUM ) 40 MG capsule Take 1 capsule (40 mg total) by mouth 2 (two) times daily. Open capsule and sprinkle on food at breakfast and dinner for better absorption. 180 capsule 3   gabapentin  (NEURONTIN ) 300 MG capsule Take 1 capsule (300 mg total) by mouth 3 (three) times daily. 270 capsule 1   insulin  lispro (HUMALOG  KWIKPEN) 100 UNIT/ML KwikPen Inject 3-7 Units into the skin 3 (three) times daily. 3 units if blood sugar is more than 200, 5 units if blood sugar is more than 250, 7 units if blood sugar is more than 300 9 mL 1   lisinopril  (ZESTRIL ) 5 MG tablet TAKE 1 TABLET(5 MG) BY MOUTH DAILY 90 tablet 1   metFORMIN  (GLUCOPHAGE -XR) 500 MG 24 hr tablet Take 2 tablets (1,000 mg total) by mouth 2 (two) times daily with a meal. 360 tablet 0   methocarbamol  (ROBAXIN ) 500 MG tablet Take 1 tablet (500 mg total) by mouth every 8 (eight) hours as needed for muscle spasms. 90  tablet 0   metroNIDAZOLE  (FLAGYL ) 500 MG tablet Take 1 tablet (500 mg total) by mouth 2 (two) times daily. 14 tablet 0   Multiple Vitamin  (MULTIVITAMIN WITH MINERALS) TABS tablet Take 2 tablets by mouth every evening.     naloxone (NARCAN) nasal spray 4 mg/0.1 mL Place 1 spray into the nose as needed.     ondansetron  (ZOFRAN -ODT) 4 MG disintegrating tablet DISSOLVE 1 TABLET(4 MG) ON THE TONGUE EVERY 8 HOURS AS NEEDED FOR NAUSEA OR VOMITING 90 tablet 1   oxyCODONE -acetaminophen  (PERCOCET) 7.5-325 MG tablet Take 1 tablet by mouth 4 (four) times daily as needed.     spironolactone  (ALDACTONE ) 50 MG tablet Take 1 tablet (50 mg total) by mouth 2 (two) times daily. Will start with 50 mg twice daily, and increase to 100 mg twice daily as needed 60 tablet 2   escitalopram  (LEXAPRO ) 20 MG tablet Take 1 tablet (20 mg total) by mouth daily. 90 tablet 1   famotidine  (PEPCID ) 20 MG tablet Take 1 tablet (20 mg total) by mouth 2 (two) times daily. 180 tablet 0   hydrOXYzine  (ATARAX ) 25 MG tablet TAKE 2 TABLETS BY MOUTH AT BEDTIME AS NEEDED FOR INSOMNIA 60 tablet 3   rosuvastatin  (CRESTOR ) 5 MG tablet TAKE 1 TABLET(5 MG) BY MOUTH DAILY 90 tablet 1   Continuous Glucose Sensor (DEXCOM G7 SENSOR) MISC Use to check glucose continuously, change sensor every 10 days (Patient not taking: Reported on 03/04/2024) 9 each 3   sodium chloride  0.9 % 1,000 mL with thiamine  100 mg, folic acid  1 mg, M.V.I. Adult 10 mL infusion      sodium chloride  0.9 % 1,000 mL with thiamine  100 mg, folic acid  1 mg, M.V.I. Adult 10 mL infusion      sodium chloride  0.9 % bolus 1,000 mL      sodium chloride  0.9 % bolus 1,000 mL      No facility-administered medications prior to visit.    Allergies  Allergen Reactions   Nsaids     Pt reported due to Bariatric surgery 09/2022   Tomato Hives, Itching and Rash    Review of Systems  Psychiatric/Behavioral:  Positive for depression.    See HPI    Objective:    Physical Exam Constitutional:      General: She is not in acute distress.    Appearance: Normal appearance. She is well-developed.  HENT:     Head: Normocephalic  and atraumatic.     Right Ear: External ear normal.     Left Ear: External ear normal.   Eyes:     General: No scleral icterus.  Neck:     Thyroid : No thyromegaly.   Cardiovascular:     Rate and Rhythm: Normal rate and regular rhythm.     Heart sounds: Normal heart sounds. No murmur heard. Pulmonary:     Effort: Pulmonary effort is normal. No respiratory distress.     Breath sounds: Normal breath sounds. No wheezing.   Musculoskeletal:     Cervical back: Neck supple.   Skin:    General: Skin is warm and dry.   Neurological:     Mental Status: She is alert and oriented to person, place, and time.   Psychiatric:        Mood and Affect: Mood normal.        Behavior: Behavior normal.        Thought Content: Thought content normal.        Judgment: Judgment normal.  BP 105/74 (BP Location: Right Arm, Patient Position: Sitting, Cuff Size: Large)   Pulse 94   Temp 98.2 F (36.8 C) (Oral)   Resp 16   Ht 5' 3 (1.6 m)   Wt 254 lb (115.2 kg)   SpO2 98%   BMI 44.99 kg/m  Wt Readings from Last 3 Encounters:  04/02/24 254 lb (115.2 kg)  04/01/24 254 lb (115.2 kg)  03/26/24 257 lb (116.6 kg)       Assessment & Plan:   Problem List Items Addressed This Visit       Unprioritized   Vitamin D  deficiency   Vit D normal in outside system. Continue calcium  + D supplement.       Uncontrolled type 2 diabetes mellitus with hyperglycemia (HCC) - Primary   On farxiga , and will increase Trulicity  to 1.5 per endo this Sunday. She is only taking insulin  if sugar >200.   Lab Results  Component Value Date   HGBA1C 7.6 (H) 02/20/2024   Keep upcoming appointment with Endo.      Relevant Medications   rosuvastatin  (CRESTOR ) 5 MG tablet   Positive ANA (antinuclear antibody)    Saw Dr. Rodell Citrin and work up was not felt to be consistent with autoimmune disease.          Osteoarthritis of knee   Reports stable.       RESOLVED: Multiple pulmonary nodules   Resolved on  follow up CT.       Insomnia   Uncontrolled on hydroxyzine , wishes to retry trazodone .      Relevant Medications   traZODone  (DESYREL ) 50 MG tablet   Hyperlipidemia   Lab Results  Component Value Date   CHOL 158 02/20/2024   HDL 51.60 02/20/2024   LDLCALC 84 02/20/2024   TRIG 112.0 02/20/2024   CHOLHDL 3 02/20/2024   At goal on crestor . Continue same.       Relevant Medications   rosuvastatin  (CRESTOR ) 5 MG tablet   Gastroesophageal reflux disease without esophagitis   Still having gerd despite nexium  and pepcid .      Relevant Medications   famotidine  (PEPCID ) 20 MG tablet   Essential hypertension   BP Readings from Last 3 Encounters:  04/02/24 105/74  04/01/24 130/86  03/26/24 120/80   Stable on lisinopril  and aldactone .      Relevant Medications   rosuvastatin  (CRESTOR ) 5 MG tablet   Depression   Stable on lexapro .  Has started back doing therapy. Continue same.       Relevant Medications   traZODone  (DESYREL ) 50 MG tablet   escitalopram  (LEXAPRO ) 20 MG tablet    I have discontinued Shaynah Villavicencio's hydrOXYzine . I have also changed her rosuvastatin . Additionally, I am having her start on traZODone . Lastly, I am having her maintain her multivitamin with minerals, CALCIUM -VITAMIN D  PO, esomeprazole , Blood Glucose Monitoring Suppl, dapagliflozin  propanediol, insulin  lispro, Accu-Chek Softclix Lancets, naloxone, Dexcom G7 Sensor, lisinopril , ondansetron , oxyCODONE -acetaminophen , metroNIDAZOLE , spironolactone , gabapentin , metFORMIN , Trulicity , methocarbamol , famotidine , and escitalopram . We will stop administering sodium chloride , (sodium chloride  0.9 % 1,000 mL with thiamine  100 mg, folic acid  1 mg, M.V.I. Adult 10 mL infusion), sodium chloride , and (sodium chloride  0.9 % 1,000 mL with thiamine  100 mg, folic acid  1 mg, M.V.I. Adult 10 mL infusion).  Meds ordered this encounter  Medications   traZODone  (DESYREL ) 50 MG tablet    Sig: Take 0.5-1 tablets (25-50 mg  total) by mouth at bedtime as needed for sleep.    Dispense:  30 tablet  Refill:  3    Supervising Provider:   Randie Bustle A [[id number]]   rosuvastatin  (CRESTOR ) 5 MG tablet    Sig: Take 1 tablet (5 mg total) by mouth daily.    Dispense:  90 tablet    Refill:  1    Supervising Provider:   Randie Bustle A [[id number]]   famotidine  (PEPCID ) 20 MG tablet    Sig: Take 1 tablet (20 mg total) by mouth 2 (two) times daily.    Dispense:  180 tablet    Refill:  1    Supervising Provider:   Randie Bustle A [[id number]]   escitalopram  (LEXAPRO ) 20 MG tablet    Sig: Take 1 tablet (20 mg total) by mouth daily.    Dispense:  90 tablet    Refill:  1    Supervising Provider:   Randie Bustle A [[id number]]

## 2024-04-02 NOTE — Assessment & Plan Note (Signed)
 On farxiga , and will increase Trulicity  to 1.5 per endo this Sunday. She is only taking insulin  if sugar >200.   Lab Results  Component Value Date   HGBA1C 7.6 (H) 02/20/2024   Keep upcoming appointment with Endo.

## 2024-04-02 NOTE — Assessment & Plan Note (Signed)
 Stable on lexapro .  Has started back doing therapy. Continue same.

## 2024-04-02 NOTE — Assessment & Plan Note (Signed)
 Lab Results  Component Value Date   CHOL 158 02/20/2024   HDL 51.60 02/20/2024   LDLCALC 84 02/20/2024   TRIG 112.0 02/20/2024   CHOLHDL 3 02/20/2024   At goal on crestor . Continue same.

## 2024-04-02 NOTE — Patient Instructions (Signed)
 VISIT SUMMARY:  Today, we reviewed your diabetes management and other ongoing health issues. Your blood sugar levels are generally well-controlled, and we discussed your upcoming hysterectomy and recent bacterial vaginosis infection. We also addressed your stomach issues, arthritis, sleep difficulties, and other health concerns.  YOUR PLAN:  TYPE 2 DIABETES MELLITUS: Your blood glucose levels are generally well-controlled with occasional higher readings. -Continue taking Farxiga  and Trulicity  as prescribed. -Use insulin  if your blood glucose exceeds 200 mg/dL.  GASTROESOPHAGEAL REFLUX DISEASE (GERD): You have persistent acid reflux symptoms despite current medications. -Continue taking Nexium  and Pepcid . -Await further evaluation by your bariatric surgeon.  BACTERIAL VAGINOSIS: You have a confirmed bacterial vaginosis infection. -Await contact from GYN for retreatment plan.  SHOULDER PAIN POST-SURGERY: You have resumed physical therapy for your shoulder pain. -Continue with physical therapy sessions.  HYPERTENSION: Your blood pressure is well-controlled with your current medications. -Continue taking lisinopril  and spironolactone .  HYPERLIPIDEMIA: Your cholesterol levels are well-controlled with Crestor . -Refill your Crestor  prescription.  INSOMNIA: You are experiencing difficulty sleeping, and hydroxyzine  is no longer effective. -Start taking trazodone  50 mg, beginning with half a tablet and increasing as needed. -Discontinue hydroxyzine .  DEPRESSION: Your mood is stable with Lexapro , and therapy sessions are beneficial. -Continue taking Lexapro . -Continue attending therapy sessions every other week.  GENERAL HEALTH MAINTENANCE: You are maintaining your health with supplements and have an upcoming eye exam. -Attend your eye exam on July 8th. -Increase your fluid intake, and consider using a straw.  FOLLOW-UP: Routine follow-up to monitor your health. -Schedule a follow-up  appointment in six months.

## 2024-04-02 NOTE — Assessment & Plan Note (Signed)
 Still having gerd despite nexium  and pepcid .

## 2024-04-02 NOTE — Assessment & Plan Note (Signed)
Resolved on follow-up CT

## 2024-04-02 NOTE — Assessment & Plan Note (Signed)
 Reports stable

## 2024-04-03 LAB — URINE CULTURE

## 2024-04-03 IMAGING — DX DG CHEST 2V
2 series · 2 of 2 positions shown · non-contrast
Comparison: March 14, 2021.

CLINICAL DATA: Preoperative evaluation for weight loss surgery.

EXAM:
CHEST - 2 VIEW

[chest pa]
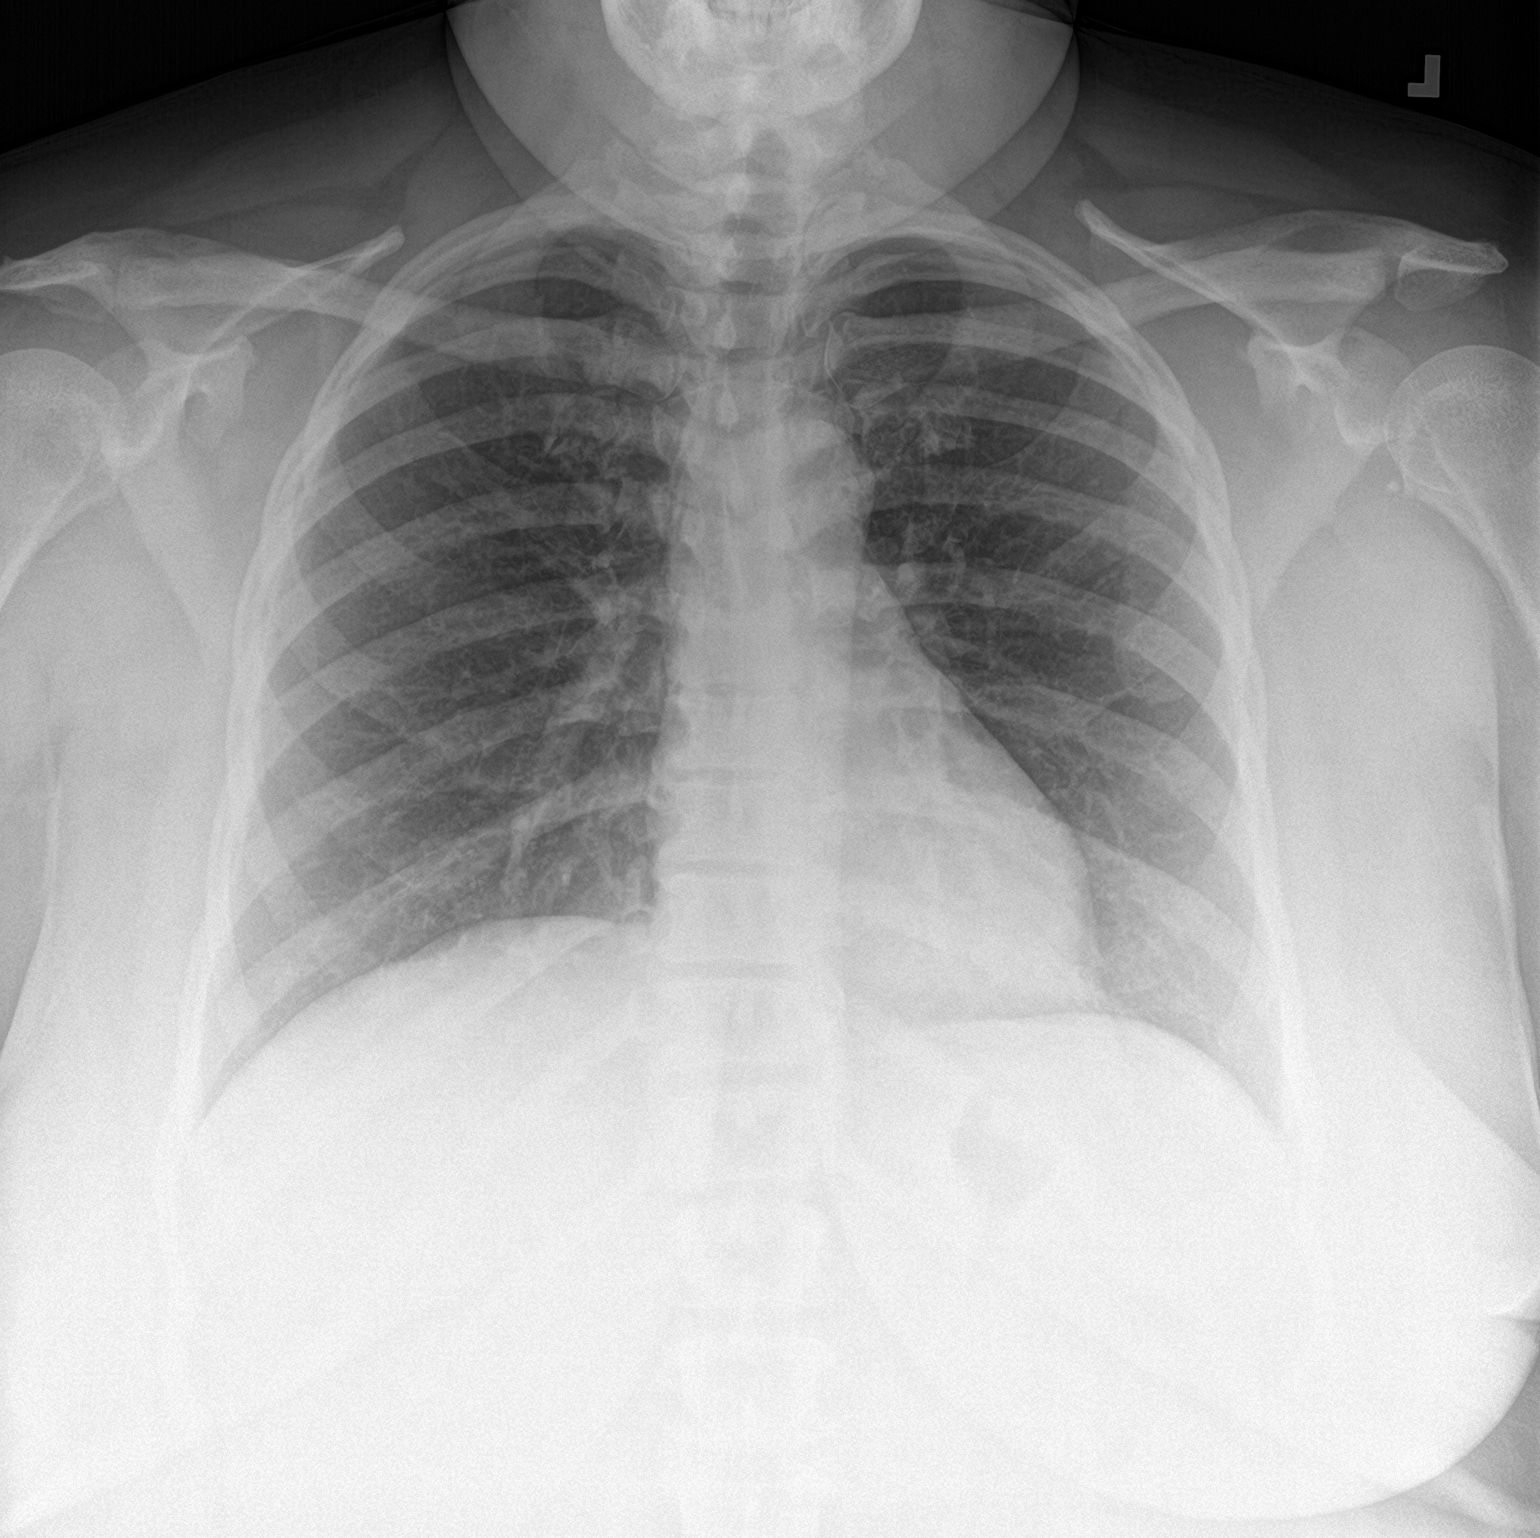

[chest lat]
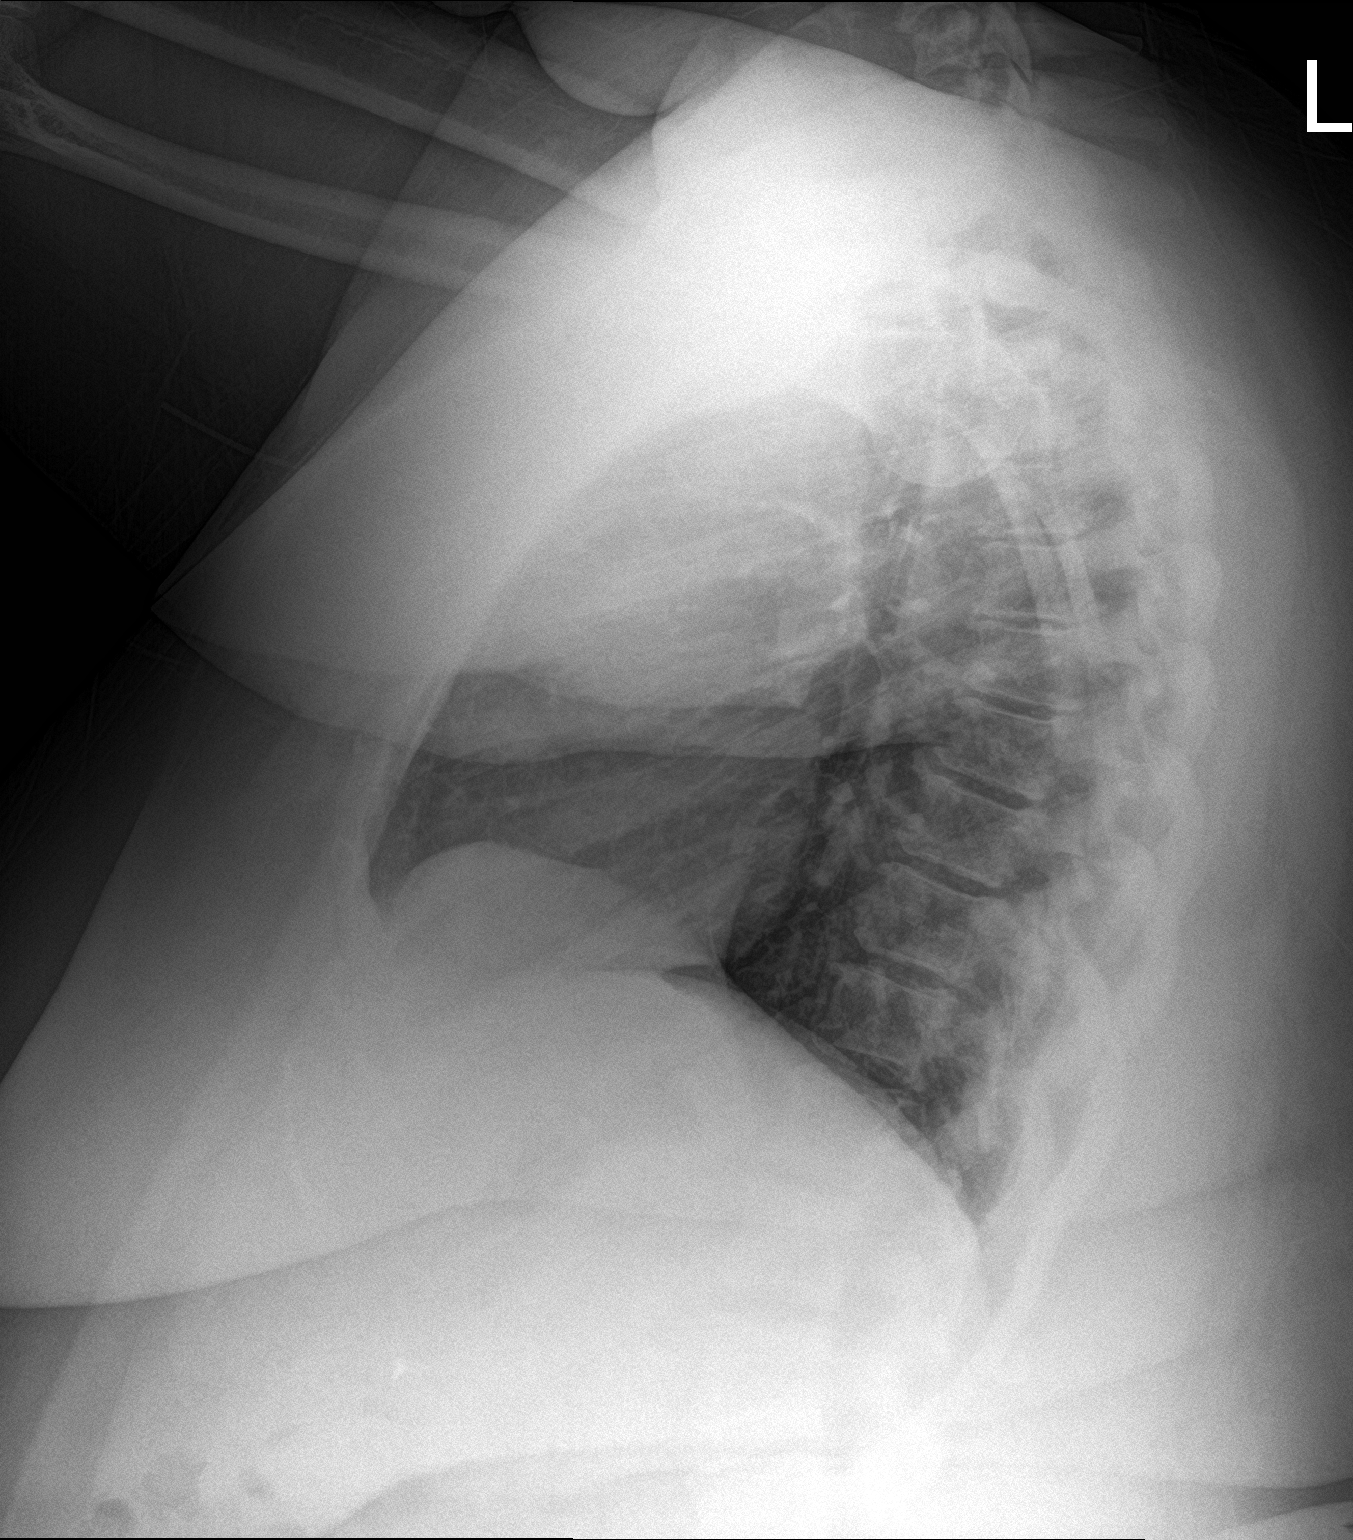

[2 of 2 positions shown; findings below may reference images not displayed]

FINDINGS: The heart size and mediastinal contours are within normal limits.
Both lungs are clear. The visualized skeletal structures are
unremarkable.
IMPRESSION: No active cardiopulmonary disease.

## 2024-04-04 ENCOUNTER — Ambulatory Visit: Payer: Self-pay | Admitting: Obstetrics and Gynecology

## 2024-04-04 DIAGNOSIS — B9689 Other specified bacterial agents as the cause of diseases classified elsewhere: Secondary | ICD-10-CM

## 2024-04-04 MED ORDER — METRONIDAZOLE 500 MG PO TABS
500.0000 mg | ORAL_TABLET | Freq: Two times a day (BID) | ORAL | 1 refills | Status: DC
Start: 2024-04-04 — End: 2024-04-20

## 2024-04-05 ENCOUNTER — Encounter: Payer: Self-pay | Admitting: Sports Medicine

## 2024-04-05 NOTE — Telephone Encounter (Signed)
 Opened in error

## 2024-04-06 ENCOUNTER — Ambulatory Visit: Admitting: Physical Therapy

## 2024-04-09 ENCOUNTER — Ambulatory Visit (INDEPENDENT_AMBULATORY_CARE_PROVIDER_SITE_OTHER): Admitting: "Endocrinology

## 2024-04-09 ENCOUNTER — Encounter: Payer: Self-pay | Admitting: "Endocrinology

## 2024-04-09 VITALS — BP 120/80 | HR 89 | Ht 63.0 in | Wt 252.0 lb

## 2024-04-09 DIAGNOSIS — E1165 Type 2 diabetes mellitus with hyperglycemia: Secondary | ICD-10-CM | POA: Diagnosis not present

## 2024-04-09 DIAGNOSIS — Z7985 Long-term (current) use of injectable non-insulin antidiabetic drugs: Secondary | ICD-10-CM | POA: Diagnosis not present

## 2024-04-09 DIAGNOSIS — E7800 Pure hypercholesterolemia, unspecified: Secondary | ICD-10-CM | POA: Diagnosis not present

## 2024-04-09 DIAGNOSIS — Z7984 Long term (current) use of oral hypoglycemic drugs: Secondary | ICD-10-CM | POA: Diagnosis not present

## 2024-04-09 MED ORDER — TRULICITY 3 MG/0.5ML ~~LOC~~ SOAJ: 3.0000 mg | SUBCUTANEOUS | 0 refills | Status: DC

## 2024-04-09 MED ORDER — LISINOPRIL 10 MG PO TABS: 10.0000 mg | ORAL_TABLET | Freq: Every day | ORAL | 0 refills | Status: DC

## 2024-04-09 MED ORDER — ROSUVASTATIN CALCIUM 20 MG PO TABS: 20.0000 mg | ORAL_TABLET | Freq: Every day | ORAL | 3 refills | Status: DC

## 2024-04-09 NOTE — Patient Instructions (Signed)

## 2024-04-09 NOTE — Progress Notes (Signed)
 Outpatient Endocrinology Note [place] Newcomer, MD  04/09/24   [place] [name] [birth year] [id number]  Referring Provider: Dorrene Gaucher, NP Primary Care Provider: Dorrene Gaucher, NP Reason for consultation: Subjective   Assessment & Plan  Diagnoses and all orders for this visit:  Uncontrolled type 2 diabetes mellitus with hyperglycemia (HCC)  Long term (current) use of oral hypoglycemic drugs  Long-term (current) use of injectable non-insulin  antidiabetic drugs  Pure hypercholesterolemia  Other orders -     Dulaglutide  (TRULICITY ) 3 MG/0.5ML SOAJ; Inject 3 mg as directed once a week. -     rosuvastatin  (CRESTOR ) 20 MG tablet; Take 1 tablet (20 mg total) by mouth daily. -     lisinopril  (ZESTRIL ) 10 MG tablet; Take 1 tablet (10 mg total) by mouth daily.     Diabetes Type II complicated by hyperglycemia  History of RYGB Lab Results  Component Value Date   GFR 91.87 08/25/2023   Hba1c goal less than 7, current Hba1c is  Lab Results  Component Value Date   HGBA1C 7.6 (H) 02/20/2024   Will recommend the following: Farxiga  10 mg every day Metformin  XR 500 mg 2 pills bid Trulicity  3 mg/week (after finishing 4 weeks on 1.5mg )  Stopped  Januvia  100 mg every day Not needing PRN Humalog : 3 units if blood sugar is more than 200, 5 units if blood sugar is more than 250, 7 units if blood sugar is more than 300 Ordered libre  Xigduo /libre/DexCom is not covered  Trulicity  was stopped due to pt needing feeding tube to nausea/vomiting after RYGB in 09/2022  No known contraindications/side effects to any of above medications  -Last LD and Tg are as follows: Lab Results  Component Value Date   LDLCALC 84 02/20/2024    Lab Results  Component Value Date   TRIG 112.0 02/20/2024   -On rosuvatstain 5 mg every day: LDL 84 -04/09/24: increase to rosuvatstain 20 mg every day -Repeat labs and CMP to watch for AST/ALT -Follow low fat diet and exercise   -Blood  pressure goal <140/90 - Microalbumin/creatinine goal is < 30 -Last MA/Cr is as follows: Lab Results  Component Value Date   MICROALBUR 18.1 03/26/2024   -on ACE/ARB lisinopril  5 mg every day  -diet changes including salt restriction -limit eating outside -counseled BP targets per standards of diabetes care -uncontrolled blood pressure can lead to retinopathy, nephropathy and cardiovascular and atherosclerotic heart disease  Reviewed and counseled on: -A1C target -Blood sugar targets -Complications of uncontrolled diabetes  -Checking blood sugar before meals and bedtime and bring log next visit -All medications with mechanism of action and side effects -Hypoglycemia management: rule of 15's, Glucagon Emergency Kit and medical alert ID -low-carb low-fat plate-method diet -At least 20 minutes of physical activity per day -Annual dilated retinal eye exam and foot exam -compliance and follow up needs -follow up as scheduled or earlier if problem gets worse  Call if blood sugar is less than 70 or consistently above 250    Take a 15 gm snack of carbohydrate at bedtime before you go to sleep if your blood sugar is less than 100.    If you are going to fast after midnight for a test or procedure, ask your physician for instructions on how to reduce/decrease your insulin  dose.    Call if blood sugar is less than 70 or consistently above 250  -Treating a low sugar by rule of 15  (15 gms of sugar every 15 min until sugar is  more than 70) If you feel your sugar is low, test your sugar to be sure If your sugar is low (less than 70), then take 15 grams of a fast acting Carbohydrate (3-4 glucose tablets or glucose gel or 4 ounces of juice or regular soda) Recheck your sugar 15 min after treating low to make sure it is more than 70 If sugar is still less than 70, treat again with 15 grams of carbohydrate          Don't drive the hour of hypoglycemia  If unconscious/unable to eat or drink by  mouth, use glucagon injection or nasal spray baqsimi and call 911. Can repeat again in 15 min if still unconscious.  Return in about 3 months (around 07/10/2024).   I have reviewed current medications, nurse's notes, allergies, vital signs, past medical and surgical history, family medical history, and social history for this encounter. Counseled patient on symptoms, examination findings, lab findings, imaging results, treatment decisions and monitoring and prognosis. The patient understood the recommendations and agrees with the treatment plan. All questions regarding treatment plan were fully answered.  [place] Newcomer, MD  04/09/24    History of Present Illness Elizabeth [name] is a 48 y.o. year old female who presents for evaluation of Type II diabetes mellitus.  [place] [name] was first diagnosed in 2009.   Diabetes education +  Home diabetes regimen: Farxiga  10 mg every day Metformin  XR 500 mg bid Trulicity  3 mg/wk   Stopped Xigduo  2.02/999 mg every day, pt wants tor resume again, was discontinued after RYGB, xigduo  not covered now  Stopped Januvia  100 mg every day  Xigduo  is not covered, DexCom is not covered Trulicity  was stopped due to pt needing feeding tube to nausea/vomiting after RYGB in 09/2022 Did well with xigduo  bid in past  COMPLICATIONS -  MI/Stroke -  retinopathy -  neuropathy -  nephropathy  SYMPTOMS REVIEWED - Polyuria + Weight loss, has RYGB 09/30/22; pre-surgery 316 lb - Blurred vision  BLOOD SUGAR DATA Checks 0-2 times a day Range: 132-263  Physical Exam  BP 120/80   Pulse 89   Ht 5' 3 (1.6 m)   Wt 252 lb (114.3 kg)   SpO2 98%   BMI 44.64 kg/m    Constitutional: well developed, well nourished Head: normocephalic, atraumatic Eyes: sclera anicteric, no redness Neck: supple Lungs: normal respiratory effort Neurology: alert and oriented Skin: dry, no appreciable rashes Musculoskeletal: no appreciable defects Psychiatric: normal mood and  affect Diabetic Foot Exam - Simple   No data filed      Current Medications Patient's Medications  New Prescriptions   DULAGLUTIDE  (TRULICITY ) 3 MG/0.5ML SOAJ    Inject 3 mg as directed once a week.   ROSUVASTATIN  (CRESTOR ) 20 MG TABLET    Take 1 tablet (20 mg total) by mouth daily.  Previous Medications   ACCU-CHEK SOFTCLIX LANCETS LANCETS    4 (four) times daily.   BLOOD GLUCOSE MONITORING SUPPL DEVI    1 each by Does not apply route in the morning, at noon, and at bedtime. May substitute to any manufacturer covered by patient's insurance.   CALCIUM -VITAMIN D  PO    Take 2 tablets by mouth every evening.   CONTINUOUS GLUCOSE SENSOR (DEXCOM G7 SENSOR) MISC    Use to check glucose continuously, change sensor every 10 days   DAPAGLIFLOZIN  PROPANEDIOL (FARXIGA ) 10 MG TABS TABLET    Take 1 tablet (10 mg total) by mouth daily before breakfast.   ESCITALOPRAM  (LEXAPRO ) 20  MG TABLET    Take 1 tablet (20 mg total) by mouth daily.   ESOMEPRAZOLE  (NEXIUM ) 40 MG CAPSULE    Take 1 capsule (40 mg total) by mouth 2 (two) times daily. Open capsule and sprinkle on food at breakfast and dinner for better absorption.   FAMOTIDINE  (PEPCID ) 20 MG TABLET    Take 1 tablet (20 mg total) by mouth 2 (two) times daily.   GABAPENTIN  (NEURONTIN ) 300 MG CAPSULE    Take 1 capsule (300 mg total) by mouth 3 (three) times daily.   INSULIN  LISPRO (HUMALOG  KWIKPEN) 100 UNIT/ML KWIKPEN    Inject 3-7 Units into the skin 3 (three) times daily. 3 units if blood sugar is more than 200, 5 units if blood sugar is more than 250, 7 units if blood sugar is more than 300   METFORMIN  (GLUCOPHAGE -XR) 500 MG 24 HR TABLET    Take 2 tablets (1,000 mg total) by mouth 2 (two) times daily with a meal.   METHOCARBAMOL  (ROBAXIN ) 500 MG TABLET    Take 1 tablet (500 mg total) by mouth every 8 (eight) hours as needed for muscle spasms.   METRONIDAZOLE  (FLAGYL ) 500 MG TABLET    Take 1 tablet (500 mg total) by mouth 2 (two) times daily.   MULTIPLE  VITAMIN (MULTIVITAMIN WITH MINERALS) TABS TABLET    Take 2 tablets by mouth every evening.   NALOXONE (NARCAN) NASAL SPRAY 4 MG/0.1 ML    Place 1 spray into the nose as needed.   ONDANSETRON  (ZOFRAN -ODT) 4 MG DISINTEGRATING TABLET    DISSOLVE 1 TABLET(4 MG) ON THE TONGUE EVERY 8 HOURS AS NEEDED FOR NAUSEA OR VOMITING   OXYCODONE -ACETAMINOPHEN  (PERCOCET) 7.5-325 MG TABLET    Take 1 tablet by mouth 4 (four) times daily as needed.   SPIRONOLACTONE  (ALDACTONE ) 50 MG TABLET    Take 1 tablet (50 mg total) by mouth 2 (two) times daily. Will start with 50 mg twice daily, and increase to 100 mg twice daily as needed   TRAZODONE  (DESYREL ) 50 MG TABLET    Take 0.5-1 tablets (25-50 mg total) by mouth at bedtime as needed for sleep.  Modified Medications   Modified Medication Previous Medication   LISINOPRIL  (ZESTRIL ) 10 MG TABLET lisinopril  (ZESTRIL ) 5 MG tablet      Take 1 tablet (10 mg total) by mouth daily.    TAKE 1 TABLET(5 MG) BY MOUTH DAILY  Discontinued Medications   DULAGLUTIDE  (TRULICITY ) 1.5 MG/0.5ML SOAJ    Inject 1.5 mg into the skin once a week.   ROSUVASTATIN  (CRESTOR ) 5 MG TABLET    Take 1 tablet (5 mg total) by mouth daily.    Allergies Allergies  Allergen Reactions   Nsaids     Pt reported due to Bariatric surgery 09/2022   Tomato Hives, Itching and Rash    Past Medical History Past Medical History:  Diagnosis Date   Anemia    Anxiety    Astigmatism    Depression    Diabetes mellitus without complication (HCC)    Dry mouth    Fatigue    GERD (gastroesophageal reflux disease)    Heartburn    Hepatic steatosis    Hepatomegaly    High cholesterol    Hypertension    Keratoconjunctivitis    Liver disease    Muscle pain    Nausea and vomiting 04/21/2023   Ovarian cyst    Positive ANA (antinuclear antibody)    Pulmonary nodules    Stomach ulcer  Vitamin D  deficiency    Weakness     Past Surgical History Past Surgical History:  Procedure Laterality Date   BIOPSY   01/18/2021   Procedure: BIOPSY;  Surgeon: Annis Kinder, DO;  Location: [facility];  Service: Gastroenterology;;   BIOPSY  10/28/2023   Procedure: BIOPSY;  Surgeon: Junie Olds, MD;  Location: [facility];  Service: General;;   CHOLECYSTECTOMY N/A 05/11/2021   Procedure: LAPAROSCOPIC CHOLECYSTECTOMY WITH INTRAOPERATIVE CHOLANGIOGRAM;  Surgeon: Junie Olds, MD;  Location: [facility];  Service: General;  Laterality: N/A;   DILATION AND CURETTAGE OF UTERUS     ENDOMETRIAL BIOPSY  12/19/2017   normal per pt   ESOPHAGOGASTRODUODENOSCOPY N/A 10/28/2023   Procedure: UPPER ENDOSCOPY;  Surgeon: Junie Olds, MD;  Location: [facility];  Service: General;  Laterality: N/A;   ESOPHAGOGASTRODUODENOSCOPY (EGD) WITH PROPOFOL  N/A 01/18/2021   Procedure: ESOPHAGOGASTRODUODENOSCOPY (EGD) WITH PROPOFOL ;  Surgeon: Annis Kinder, DO;  Location: [facility];  Service: Gastroenterology;  Laterality: N/A;   HIATAL HERNIA REPAIR N/A 09/30/2022   Procedure: HERNIA REPAIR HIATAL;  Surgeon: Junie Olds, MD;  Location: [facility];  Service: General;  Laterality: N/A;   LAPAROSCOPIC INSERTION GASTROSTOMY TUBE N/A 12/02/2022   Procedure: LAPAROSCOPIC REMNANT GASTROSTOMY G TUBE;  Surgeon: Junie Olds, MD;  Location: [facility];  Service: General;  Laterality: N/A;   LAPAROSCOPIC ROUX-EN-Y GASTRIC BYPASS WITH HIATAL HERNIA REPAIR  09/30/2022   LAPAROSCOPY N/A 12/02/2022   Procedure: LAPAROSCOPY DIAGNOSTIC;  Surgeon: Junie Olds, MD;  Location: [facility];  Service: General;  Laterality: N/A;   TONSILLECTOMY     TUBAL LIGATION     UPPER GI ENDOSCOPY N/A 09/30/2022   Procedure: UPPER GI ENDOSCOPY;  Surgeon: Junie Olds, MD;  Location: [facility];  Service: General;  Laterality: N/A;   UPPER GI ENDOSCOPY N/A 12/02/2022   Procedure: UPPER ENDOSCOPY;  Surgeon: Junie Olds, MD;  Location: [facility];  Service: General;  Laterality: N/A;    Family History family history  includes Alcohol abuse in her father; Diabetes in her mother; Drug abuse in her father; HIV in her father; Hyperlipidemia in her mother; Hypertension in her mother; Lupus in her maternal aunt; Obesity in her mother; Ovarian cancer in her maternal grandmother; Peripheral Artery Disease in her mother.  Social History Social History   Socioeconomic History   Marital status: Widowed    Spouse name: Sylvester Evert   Number of children: 2   Years of education: Not on file   Highest education level: Associate degree: occupational, Scientist, product/process development, or vocational program  Occupational History   Occupation: CMA - Geophysicist/field seismologist: [employer]  Tobacco Use   Smoking status: Former    Current packs/day: 0.00    Average packs/day: 0.5 packs/day for 3.0 years (1.5 ttl pk-yrs)    Types: Cigarettes    Start date: 08/21/2018    Quit date: 08/21/2021    Years since quitting: 2.6    Passive exposure: Never   Smokeless tobacco: Never   Tobacco comments:    4-5/day  Vaping Use   Vaping status: Never Used  Substance and Sexual Activity   Alcohol use: No   Drug use: No   Sexual activity: Yes    Partners: Male    Birth control/protection: Surgical, Implant  Other Topics Concern   Not on file  Social History Narrative    2 children   1997- son Orson Blalock   2000- son Designer, jewellery in  Behavioral health- CMA in [place]   Enjoys reading   Widowed 08-06-24, husband died from COVID-19.   Social Drivers of Corporate investment banker Strain: High Risk (04/01/2024)   Overall Financial Resource Strain (CARDIA)    Difficulty of Paying Living Expenses: Very hard  Food Insecurity: No Food Insecurity (04/01/2024)   Hunger Vital Sign    Worried About Running Out of Food in the Last Year: Never true    Ran Out of Food in the Last Year: Never true  Transportation Needs: No Transportation Needs (04/01/2024)   PRAPARE - Administrator, Civil Service (Medical): No    Lack of Transportation  (Non-Medical): No  Physical Activity: Inactive (04/01/2024)   Exercise Vital Sign    Days of Exercise per Week: 0 days    Minutes of Exercise per Session: Not on file  Stress: Stress Concern Present (04/01/2024)   [employer] of Occupational Health - Occupational Stress Questionnaire    Feeling of Stress: Rather much  Social Connections: Moderately Integrated (04/01/2024)   Social Connection and Isolation Panel    Frequency of Communication with Friends and Family: More than three times a week    Frequency of Social Gatherings with Friends and Family: Never    Attends Religious Services: 1 to 4 times per year    Active Member of [employer] or Organizations: Yes    Attends Banker Meetings: 1 to 4 times per year    Marital Status: Widowed  Recent Concern: Social Connections - Moderately Isolated (01/14/2024)   Social Connection and Isolation Panel    Frequency of Communication with Friends and Family: Three times a week    Frequency of Social Gatherings with Friends and Family: Never    Attends Religious Services: 1 to 4 times per year    Active Member of [employer] or Organizations: No    Attends Banker Meetings: Not on file    Marital Status: Widowed  Intimate Partner Violence: Not At Risk (12/02/2022)   Humiliation, Afraid, Rape, and Kick questionnaire    Fear of Current or Ex-Partner: No    Emotionally Abused: No    Physically Abused: No    Sexually Abused: No    Lab Results  Component Value Date   HGBA1C 7.6 (H) 02/20/2024   HGBA1C 9.0 (H) 09/26/2023   HGBA1C 8.7 (H) 05/28/2023   Lab Results  Component Value Date   CHOL 158 02/20/2024   Lab Results  Component Value Date   HDL 51.60 02/20/2024   Lab Results  Component Value Date   LDLCALC 84 02/20/2024   Lab Results  Component Value Date   TRIG 112.0 02/20/2024   Lab Results  Component Value Date   CHOLHDL 3 02/20/2024   Lab Results  Component Value Date   CREATININE 0.69 12/09/2023    Lab Results  Component Value Date   GFR 91.87 08/25/2023   Lab Results  Component Value Date   MICROALBUR 18.1 03/26/2024      Component Value Date/Time   NA 137 12/09/2023 1319   NA 137 02/27/2022 0000   K 4.0 12/09/2023 1319   CL 104 12/09/2023 1319   CO2 23 12/09/2023 1319   GLUCOSE 143 (H) 12/09/2023 1319   BUN 17 12/09/2023 1319   BUN 7 12/22/2018 1430   CREATININE 0.69 12/09/2023 1319   CREATININE 0.67 09/26/2023 0809   CALCIUM  9.2 12/09/2023 1319   PROT 7.8 12/09/2023 1319   PROT 6.8 12/22/2018 1430  ALBUMIN 3.8 12/09/2023 1319   ALBUMIN 4.3 12/22/2018 1430   AST 23 12/09/2023 1319   ALT 32 12/09/2023 1319   ALKPHOS 80 12/09/2023 1319   BILITOT 0.5 12/09/2023 1319   BILITOT 0.3 12/22/2018 1430   GFRNONAA >60 12/09/2023 1319   GFRAA 133 12/22/2018 1430      Latest Ref Rng & Units 12/09/2023    1:19 PM 09/26/2023    8:09 AM 08/25/2023   10:39 AM  BMP  Glucose 70 - 99 mg/dL 960  454  098   BUN 6 - 20 mg/dL 17  11  10    Creatinine 0.44 - 1.00 mg/dL 1.19  1.47  8.29   BUN/Creat Ratio 6 - 22 (calc)  SEE NOTE:    Sodium 135 - 145 mmol/L 137  139  138   Potassium 3.5 - 5.1 mmol/L 4.0  4.4  4.4   Chloride 98 - 111 mmol/L 104  104  103   CO2 22 - 32 mmol/L 23  24  26    Calcium  8.9 - 10.3 mg/dL 9.2  9.4  9.2        Component Value Date/Time   WBC 8.0 12/09/2023 1319   RBC 5.54 (H) 12/09/2023 1319   HGB 12.9 12/09/2023 1319   HGB 13.3 12/22/2018 1430   HCT 41.5 12/09/2023 1319   HCT 43.4 12/22/2018 1430   PLT 325 12/09/2023 1319   PLT 301 12/22/2018 1430   MCV 74.9 (L) 12/09/2023 1319   MCV 73 (L) 12/22/2018 1430   MCH 23.3 (L) 12/09/2023 1319   MCHC 31.1 12/09/2023 1319   RDW 15.2 12/09/2023 1319   RDW 15.9 (H) 12/22/2018 1430   LYMPHSABS 2.1 11/24/2022 0305   LYMPHSABS 2.1 12/22/2018 1430   MONOABS 0.4 11/24/2022 0305   EOSABS 0.1 11/24/2022 0305   EOSABS 0.1 12/22/2018 1430   BASOSABS 0.0 11/24/2022 0305   BASOSABS 0.1 12/22/2018 1430      Parts of this note may have been dictated using voice recognition software. There may be variances in spelling and vocabulary which are unintentional. Not all errors are proofread. Please notify the [facility] if any discrepancies are noted or if the meaning of any statement is not clear.

## 2024-04-12 ENCOUNTER — Encounter: Payer: Self-pay | Admitting: Family

## 2024-04-12 ENCOUNTER — Telehealth: Payer: Self-pay | Admitting: Pharmacy Technician

## 2024-04-12 ENCOUNTER — Other Ambulatory Visit (HOSPITAL_COMMUNITY): Payer: Self-pay

## 2024-04-12 ENCOUNTER — Encounter: Payer: Self-pay | Admitting: "Endocrinology

## 2024-04-12 DIAGNOSIS — L680 Hirsutism: Secondary | ICD-10-CM

## 2024-04-12 NOTE — Telephone Encounter (Signed)
 Pharmacy Patient Advocate Encounter  Received notification from [facility] Medicaid that Prior Authorization for Trulicity  3MG /0.5ML auto-injectors has been APPROVED from 03/29/2024 to 04/12/2025. Ran test claim, Copay is $4.00. This test claim was processed through [facility]- copay amounts may vary at other pharmacies due to pharmacy/plan contracts, or as the patient moves through the different stages of their insurance plan.   PA #/Case ID/Reference #: [id number]

## 2024-04-12 NOTE — Telephone Encounter (Signed)
 Pharmacy Patient Advocate Encounter   Received notification from CoverMyMeds that prior authorization for Trulicity  3MG /0.5ML auto-injectors is required/requested.   Insurance verification completed.   The patient is insured through [facility] [place] .   Per test claim: PA required; PA submitted to above mentioned insurance via CoverMyMeds Key/confirmation #/EOC [id number] Status is pending

## 2024-04-13 ENCOUNTER — Other Ambulatory Visit: Payer: Self-pay | Admitting: Family

## 2024-04-13 ENCOUNTER — Ambulatory Visit: Admitting: Family

## 2024-04-13 DIAGNOSIS — G4700 Insomnia, unspecified: Secondary | ICD-10-CM

## 2024-04-13 DIAGNOSIS — F32A Depression, unspecified: Secondary | ICD-10-CM

## 2024-04-14 ENCOUNTER — Other Ambulatory Visit: Payer: Self-pay

## 2024-04-14 ENCOUNTER — Other Ambulatory Visit: Payer: Self-pay | Admitting: Family

## 2024-04-14 ENCOUNTER — Encounter: Payer: Self-pay | Admitting: "Endocrinology

## 2024-04-14 ENCOUNTER — Encounter: Payer: Self-pay | Admitting: Family

## 2024-04-14 DIAGNOSIS — F32A Depression, unspecified: Secondary | ICD-10-CM

## 2024-04-14 MED ORDER — ONDANSETRON 4 MG PO TBDP: 4.0000 mg | ORAL_TABLET | Freq: Three times a day (TID) | ORAL | 0 refills | Status: DC | PRN

## 2024-04-15 ENCOUNTER — Other Ambulatory Visit: Payer: Self-pay | Admitting: *Deleted

## 2024-04-15 ENCOUNTER — Other Ambulatory Visit: Payer: Self-pay | Admitting: Family

## 2024-04-15 MED ORDER — METHOCARBAMOL 500 MG PO TABS: 500.0000 mg | ORAL_TABLET | Freq: Three times a day (TID) | ORAL | 0 refills | Status: DC | PRN

## 2024-04-15 NOTE — Telephone Encounter (Signed)
 Forms received from [employer] for prescription histofy

## 2024-04-16 ENCOUNTER — Emergency Department (HOSPITAL_BASED_OUTPATIENT_CLINIC_OR_DEPARTMENT_OTHER)
Admission: EM | Admit: 2024-04-16 | Discharge: 2024-04-16 | Disposition: A | Discharge status: Home / Self Care | Attending: Emergency Medicine | Admitting: Emergency Medicine

## 2024-04-16 ENCOUNTER — Other Ambulatory Visit: Payer: Self-pay | Admitting: Sports Medicine

## 2024-04-16 ENCOUNTER — Emergency Department (HOSPITAL_BASED_OUTPATIENT_CLINIC_OR_DEPARTMENT_OTHER)

## 2024-04-16 ENCOUNTER — Ambulatory Visit
Admission: RE | Admit: 2024-04-16 | Discharge: 2024-04-16 | Disposition: A | Discharge status: Home / Self Care | Source: Ambulatory Visit | Attending: Family Medicine | Admitting: Family Medicine

## 2024-04-16 ENCOUNTER — Other Ambulatory Visit: Payer: Self-pay

## 2024-04-16 ENCOUNTER — Encounter (HOSPITAL_BASED_OUTPATIENT_CLINIC_OR_DEPARTMENT_OTHER): Payer: Self-pay | Admitting: Emergency Medicine

## 2024-04-16 VITALS — BP 112/88 | HR 85 | Temp 98.8°F | Resp 19

## 2024-04-16 DIAGNOSIS — R81 Glycosuria: Secondary | ICD-10-CM | POA: Insufficient documentation

## 2024-04-16 DIAGNOSIS — M545 Low back pain, unspecified: Secondary | ICD-10-CM | POA: Insufficient documentation

## 2024-04-16 DIAGNOSIS — Z794 Long term (current) use of insulin: Secondary | ICD-10-CM | POA: Diagnosis not present

## 2024-04-16 DIAGNOSIS — R1030 Lower abdominal pain, unspecified: Secondary | ICD-10-CM | POA: Diagnosis present

## 2024-04-16 DIAGNOSIS — R102 Pelvic and perineal pain: Secondary | ICD-10-CM | POA: Insufficient documentation

## 2024-04-16 DIAGNOSIS — R300 Dysuria: Secondary | ICD-10-CM | POA: Insufficient documentation

## 2024-04-16 LAB — POCT URINALYSIS DIP (MANUAL ENTRY)
Blood, UA: NEGATIVE
Glucose, UA: >=1000 mg/dL — AB
Leukocytes, UA: NEGATIVE
Nitrite, UA: NEGATIVE
Protein Ur, POC: 30 mg/dL — AB
Spec Grav, UA: 1.02 (ref 1.010–1.025)
Urobilinogen, UA: 0.2 U/dL
pH, UA: 6 (ref 5.0–8.0)

## 2024-04-16 LAB — URINALYSIS, ROUTINE W REFLEX MICROSCOPIC
Glucose, UA: >=500 mg/dL — AB
Hgb urine dipstick: NEGATIVE
Ketones, ur: 15 mg/dL — AB
Leukocytes,Ua: NEGATIVE
Nitrite: NEGATIVE
Protein, ur: 30 mg/dL — AB
Specific Gravity, Urine: 1.025 (ref 1.005–1.030)
pH: 6 (ref 5.0–8.0)

## 2024-04-16 LAB — CBC
HCT: 39.6 % (ref 36.0–46.0)
Hemoglobin: 12.5 g/dL (ref 12.0–15.0)
MCH: 23.7 pg — ABNORMAL LOW (ref 26.0–34.0)
MCHC: 31.6 g/dL (ref 30.0–36.0)
MCV: 75 fL — ABNORMAL LOW (ref 80.0–100.0)
Platelets: 304 10*3/uL (ref 150–400)
RBC: 5.28 MIL/uL — ABNORMAL HIGH (ref 3.87–5.11)
RDW: 14 % (ref 11.5–15.5)
WBC: 7.5 10*3/uL (ref 4.0–10.5)
nRBC: 0 % (ref 0.0–0.2)

## 2024-04-16 LAB — COMPREHENSIVE METABOLIC PANEL WITH GFR
ALT: 16 U/L (ref 0–44)
AST: 21 U/L (ref 15–41)
Albumin: 4.3 g/dL (ref 3.5–5.0)
Alkaline Phosphatase: 76 U/L (ref 38–126)
Anion gap: 16 — ABNORMAL HIGH (ref 5–15)
BUN: 16 mg/dL (ref 6–20)
CO2: 21 mmol/L — ABNORMAL LOW (ref 22–32)
Calcium: 9.8 mg/dL (ref 8.9–10.3)
Chloride: 102 mmol/L (ref 98–111)
Creatinine, Ser: 0.88 mg/dL (ref 0.44–1.00)
GFR, Estimated: >60 mL/min (ref >60)
Glucose, Bld: 115 mg/dL — ABNORMAL HIGH (ref 70–99)
Potassium: 4.4 mmol/L (ref 3.5–5.1)
Sodium: 138 mmol/L (ref 135–145)
Total Bilirubin: <0.2 mg/dL (ref 0.0–1.2)
Total Protein: 7.7 g/dL (ref 6.5–8.1)

## 2024-04-16 LAB — URINALYSIS, MICROSCOPIC (REFLEX): WBC, UA: NONE SEEN WBC/hpf (ref 0–5)

## 2024-04-16 LAB — POCT FASTING CBG KUC MANUAL ENTRY: POCT Glucose (KUC): 133 mg/dL — AB (ref 70–99)

## 2024-04-16 LAB — HCG, SERUM, QUALITATIVE: Preg, Serum: NEGATIVE

## 2024-04-16 LAB — LIPASE, BLOOD: Lipase: 65 U/L — ABNORMAL HIGH (ref 11–51)

## 2024-04-16 MED ORDER — HYDROMORPHONE HCL 1 MG/ML IJ SOLN
1.0000 mg | Freq: Once | INTRAMUSCULAR | Status: AC
  Administered 2024-04-16: 1 mg via INTRAVENOUS
  Filled 2024-04-16: qty 1

## 2024-04-16 MED ORDER — SODIUM CHLORIDE 0.9 % IV BOLUS
1000.0000 mL | Freq: Once | INTRAVENOUS | Status: AC
  Administered 2024-04-16: 1000 mL via INTRAVENOUS

## 2024-04-16 MED ORDER — IOHEXOL 300 MG/ML  SOLN
100.0000 mL | Freq: Once | INTRAMUSCULAR | Status: AC | PRN
  Administered 2024-04-16: 100 mL via INTRAVENOUS

## 2024-04-16 MED ORDER — ONDANSETRON HCL 4 MG/2ML IJ SOLN
4.0000 mg | Freq: Once | INTRAMUSCULAR | Status: AC
  Administered 2024-04-16: 4 mg via INTRAVENOUS
  Filled 2024-04-16: qty 2

## 2024-04-16 NOTE — ED Triage Notes (Addendum)
 [facility] with [facility] pelvic cramps, back pain, dysuria and vaginal discomfort for 4 weeks. pt reports recent BV and has taken 2 rounds of antibiotics with no improvement of symptoms. Pt reports last pap smear was 3 years ago. Pt denies any sexual intercourse, her husband is deceased.

## 2024-04-16 NOTE — Discharge Instructions (Addendum)
 Advised patient please go to [facility] ED now for further evaluation including transvaginal ultrasound.

## 2024-04-16 NOTE — ED Notes (Signed)
 Patient is being discharged from the Urgent Care and sent to the Emergency Department via pov with son . Per ragan, NP, patient is in need of higher level of care due to pelvic pain. Patient is aware and verbalizes understanding of plan of care.  Vitals:   04/16/24 1830 04/16/24 1834  BP:  112/88  Pulse: 85   Resp: 19   Temp: 98.8 F (37.1 C)   SpO2: 98%

## 2024-04-16 NOTE — ED Provider Notes (Addendum)
 [facility]    CSN: [id number] Arrival date & time: 04/16/24  1813      History   Chief Complaint Chief Complaint  Patient presents with   Pelvic Pain   Dysuria   Vaginitis    HPI Elizabeth [name] is a 48 y.o. female.   HPI 48 year old female resents with pelvic cramps, back pain, dysuria and vaginal discomfort for weeks.  Patient reports recent BV and has taken 2 rounds of antibiotics without improvement of symptoms.  Patient reports last Pap smear was 3 years ago.  Patient denies sexual intercourse.  Patient is scheduled for hysterectomy on 05/10/2024.  Patient is currently followed by GYN.  PMH significant for severe/morbid obesity, T2 DM without complication, and HTN.  Past Medical History:  Diagnosis Date   Anemia    Anxiety    Astigmatism    Depression    Diabetes mellitus without complication (HCC)    Dry mouth    Fatigue    GERD (gastroesophageal reflux disease)    Heartburn    Hepatic steatosis    Hepatomegaly    High cholesterol    Hypertension    Keratoconjunctivitis    Liver disease    Muscle pain    Nausea and vomiting 04/21/2023   Ovarian cyst    Positive ANA (antinuclear antibody)    Pulmonary nodules    Stomach ulcer    Vitamin D  deficiency    Weakness     Patient Active Problem List   Diagnosis Date Noted   Multiple gastric ulcers 11/17/2023   Insomnia 08/25/2023   Chronic gastritis without bleeding 04/11/2023   Bilateral shoulder pain 02/18/2023   History of gastric bypass 12/02/2022   Positive ANA (antinuclear antibody) 11/26/2022   Excessive bleeding in premenopausal period 11/11/2022   Osteoarthritis of knee 03/19/2022   Preventative health care 12/31/2021   Drug-induced constipation 12/31/2021   Depression 11/07/2021   Chronic abdominal pain 04/06/2021   Grief reaction 04/06/2021   Gastroesophageal reflux disease without esophagitis    Hyperlipidemia 01/21/2018   Uncontrolled type 2 diabetes mellitus with hyperglycemia  (HCC) 01/21/2018   Keratoconjunctivitis sicca of both eyes not specified as Sjogren's 04/04/2017   Regular astigmatism of both eyes 04/04/2017   Vitamin D  deficiency 12/02/2016   Essential hypertension 02/07/2016   Morbid obesity with BMI of 50.0-59.9, adult (HCC) 02/07/2016    Past Surgical History:  Procedure Laterality Date   BIOPSY  01/18/2021   Procedure: BIOPSY;  Surgeon: San Sandor GAILS, DO;  Location: [facility];  Service: Gastroenterology;;   BIOPSY  10/28/2023   Procedure: BIOPSY;  Surgeon: Lyndel Deward PARAS, MD;  Location: [facility];  Service: General;;   CHOLECYSTECTOMY N/A 05/11/2021   Procedure: LAPAROSCOPIC CHOLECYSTECTOMY WITH INTRAOPERATIVE CHOLANGIOGRAM;  Surgeon: Lyndel Deward PARAS, MD;  Location: [facility];  Service: General;  Laterality: N/A;   DILATION AND CURETTAGE OF UTERUS     ENDOMETRIAL BIOPSY  12/19/2017   normal per pt   ESOPHAGOGASTRODUODENOSCOPY N/A 10/28/2023   Procedure: UPPER ENDOSCOPY;  Surgeon: Lyndel Deward PARAS, MD;  Location: [facility];  Service: General;  Laterality: N/A;   ESOPHAGOGASTRODUODENOSCOPY (EGD) WITH PROPOFOL  N/A 01/18/2021   Procedure: ESOPHAGOGASTRODUODENOSCOPY (EGD) WITH PROPOFOL ;  Surgeon: San Sandor GAILS, DO;  Location: [facility];  Service: Gastroenterology;  Laterality: N/A;   HIATAL HERNIA REPAIR N/A 09/30/2022   Procedure: HERNIA REPAIR HIATAL;  Surgeon: Lyndel Deward PARAS, MD;  Location: [facility];  Service: General;  Laterality: N/A;   LAPAROSCOPIC INSERTION GASTROSTOMY TUBE N/A 12/02/2022  Procedure: LAPAROSCOPIC REMNANT GASTROSTOMY G TUBE;  Surgeon: Lyndel Deward PARAS, MD;  Location: [facility];  Service: General;  Laterality: N/A;   LAPAROSCOPIC ROUX-EN-Y GASTRIC BYPASS WITH HIATAL HERNIA REPAIR  09/30/2022   LAPAROSCOPY N/A 12/02/2022   Procedure: LAPAROSCOPY DIAGNOSTIC;  Surgeon: Lyndel Deward PARAS, MD;  Location: [facility];  Service: General;  Laterality: N/A;   TONSILLECTOMY     TUBAL LIGATION     UPPER GI  ENDOSCOPY N/A 09/30/2022   Procedure: UPPER GI ENDOSCOPY;  Surgeon: Lyndel Deward PARAS, MD;  Location: [facility];  Service: General;  Laterality: N/A;   UPPER GI ENDOSCOPY N/A 12/02/2022   Procedure: UPPER ENDOSCOPY;  Surgeon: Lyndel Deward PARAS, MD;  Location: [facility];  Service: General;  Laterality: N/A;    OB History     Gravida  2   Para  2   Term  2   Preterm  0   AB  0   Living  2      SAB  0   IAB  0   Ectopic  0   Multiple  0   Live Births               Home Medications    Prior to Admission medications   Medication Sig Start Date End Date Taking? Authorizing Provider  Accu-Chek Softclix Lancets lancets 4 (four) times daily. 08/29/23   [provider]  Blood Glucose Monitoring Suppl DEVI 1 each by Does not apply route in the morning, at noon, and at bedtime. May substitute to any manufacturer covered by patient's insurance. 08/29/23   Daryl Setter, NP  CALCIUM -VITAMIN D  PO Take 2 tablets by mouth every evening.    [provider]  Continuous Glucose Sensor (DEXCOM G7 SENSOR) MISC Use to check glucose continuously, change sensor every 10 days Patient not taking: Reported on 03/04/2024 11/12/23   Motwani, Komal, MD  dapagliflozin  propanediol (FARXIGA ) 10 MG TABS tablet Take 1 tablet (10 mg total) by mouth daily before breakfast. 10/03/23   Motwani, Obadiah, MD  Dulaglutide  (TRULICITY ) 3 MG/0.5ML SOAJ Inject 3 mg as directed once a week. 04/09/24   Motwani, Komal, MD  escitalopram  (LEXAPRO ) 20 MG tablet Take 1 tablet (20 mg total) by mouth daily. 04/02/24   O'Sullivan, Melissa, NP  esomeprazole  (NEXIUM ) 40 MG capsule Take 1 capsule (40 mg total) by mouth 2 (two) times daily. Open capsule and sprinkle on food at breakfast and dinner for better absorption. 08/12/23     famotidine  (PEPCID ) 20 MG tablet Take 1 tablet (20 mg total) by mouth 2 (two) times daily. 04/02/24   O'Sullivan, Melissa, NP  gabapentin  (NEURONTIN ) 300 MG capsule Take 1 capsule  (300 mg total) by mouth 3 (three) times daily. 03/18/24   O'Sullivan, Melissa, NP  insulin  lispro (HUMALOG  KWIKPEN) 100 UNIT/ML KwikPen Inject 3-7 Units into the skin 3 (three) times daily. 3 units if blood sugar is more than 200, 5 units if blood sugar is more than 250, 7 units if blood sugar is more than 300 10/03/23 04/09/24  Motwani, Obadiah, MD  Lancets Misc. (ACCU-CHEK SOFTCLIX LANCET DEV) KIT USE MORNING, NOON, AND AT BEDTIME 04/15/24   O'Sullivan, Melissa, NP  lisinopril  (ZESTRIL ) 10 MG tablet Take 1 tablet (10 mg total) by mouth daily. 04/09/24   Motwani, Komal, MD  metFORMIN  (GLUCOPHAGE -XR) 500 MG 24 hr tablet Take 2 tablets (1,000 mg total) by mouth 2 (two) times daily with a meal. 03/24/24   Dartha Obadiah, MD  methocarbamol  (ROBAXIN ) 500  MG tablet Take 1 tablet (500 mg total) by mouth every 8 (eight) hours as needed for muscle spasms. 04/15/24   Arvell Evalene SAUNDERS, DO  metroNIDAZOLE  (FLAGYL ) 500 MG tablet Take 1 tablet (500 mg total) by mouth 2 (two) times daily. 04/04/24   Cleatus Moccasin, MD  Multiple Vitamin (MULTIVITAMIN WITH MINERALS) TABS tablet Take 2 tablets by mouth every evening.    [provider]  naloxone [facility]) nasal spray 4 mg/0.1 mL Place 1 spray into the nose as needed. 07/30/23   [provider]  ondansetron  (ZOFRAN -ODT) 4 MG disintegrating tablet Take 1 tablet (4 mg total) by mouth every 8 (eight) hours as needed for nausea or vomiting. 04/14/24   O'Sullivan, Melissa, NP  oxyCODONE -acetaminophen  (PERCOCET) 7.5-325 MG tablet Take 1 tablet by mouth 4 (four) times daily as needed.    [provider]  rosuvastatin  (CRESTOR ) 20 MG tablet Take 1 tablet (20 mg total) by mouth daily. 04/09/24   Motwani, Komal, MD  spironolactone  (ALDACTONE ) 50 MG tablet Take 1 tablet (50 mg total) by mouth 2 (two) times daily. Will start with 50 mg twice daily, and increase to 100 mg twice daily as needed 03/16/24   Cleatus Moccasin, MD  traZODone  (DESYREL ) 50 MG tablet Take 1 tablet (50  mg total) by mouth at bedtime. 04/13/24   Daryl Setter, NP    Family History Family History  Problem Relation Age of Onset   Diabetes Mother    Hypertension Mother    Hyperlipidemia Mother    Obesity Mother    Peripheral Artery Disease Mother    HIV Father        died from complications (IVDU)   Alcohol abuse Father    Drug abuse Father    Lupus Maternal Aunt    Ovarian cancer Maternal Grandmother    Colon cancer Neg Hx    Esophageal cancer Neg Hx    Liver disease Neg Hx    Pancreatic cancer Neg Hx    Stomach cancer Neg Hx    Sleep apnea Neg Hx    Rectal cancer Neg Hx     Social History Social History   Tobacco Use   Smoking status: Former    Current packs/day: 0.00    Average packs/day: 0.5 packs/day for 3.0 years (1.5 ttl pk-yrs)    Types: Cigarettes    Start date: 08/21/2018    Quit date: 08/21/2021    Years since quitting: 2.6    Passive exposure: Never   Smokeless tobacco: Never   Tobacco comments:    4-5/day  Vaping Use   Vaping status: Never Used  Substance Use Topics   Alcohol use: No   Drug use: No     Allergies   Nsaids and Tomato   Review of Systems Review of Systems  Genitourinary:  Positive for frequency and pelvic pain.     Physical Exam Triage Vital Signs ED Triage Vitals  Encounter Vitals Group     BP      Girls Systolic BP Percentile      Girls Diastolic BP Percentile      Boys Systolic BP Percentile      Boys Diastolic BP Percentile      Pulse      Resp      Temp      Temp src      SpO2      Weight      Height      Head Circumference  Peak Flow      Pain Score      Pain Loc      Pain Education      Exclude from Growth Chart    No data found.  Updated Vital Signs BP 112/88   Pulse 85   Temp 98.8 F (37.1 C)   Resp 19   SpO2 98%    Physical Exam Vitals and nursing note reviewed.  Constitutional:      General: She is not in acute distress.    Appearance: She is well-developed. She is obese. She is  ill-appearing.  HENT:     Head: Normocephalic and atraumatic.     Mouth/Throat:     Mouth: Mucous membranes are moist.     Pharynx: Oropharynx is clear.   Eyes:     Extraocular Movements: Extraocular movements intact.     Conjunctiva/sclera: Conjunctivae normal.     Pupils: Pupils are equal, round, and reactive to light.    Cardiovascular:     Rate and Rhythm: Normal rate and regular rhythm.     Pulses: Normal pulses.     Heart sounds: Normal heart sounds.  Pulmonary:     Effort: Pulmonary effort is normal.     Breath sounds: Normal breath sounds. No wheezing, rhonchi or rales.  Abdominal:     Hernia: No hernia is present.   Musculoskeletal:        General: Normal range of motion.   Skin:    General: Skin is warm and dry.   Neurological:     General: No focal deficit present.     Mental Status: She is alert and oriented to person, place, and time.   Psychiatric:        Mood and Affect: Mood normal.        Behavior: Behavior normal.      UC Treatments / Results  Labs (all labs ordered are listed, but only abnormal results are displayed) Labs Reviewed  POCT URINALYSIS DIP (MANUAL ENTRY) - Abnormal; Notable for the following components:      Result Value   Glucose, UA >=1,000 (*)    Bilirubin, UA small (*)    Ketones, POC UA trace (5) (*)    Protein Ur, POC =30 (*)    All other components within normal limits  POCT FASTING CBG K[facility] MANUAL ENTRY - Abnormal; Notable for the following components:   POCT Glucose ([facility]) 133 (*)    All other components within normal limits  CERVICOVAGINAL ANCILLARY ONLY    EKG   Radiology No results found.  Procedures Procedures (including critical care time)  Medications Ordered in UC Medications - No data to display  Initial Impression / Assessment and Plan / UC Course  I have reviewed the triage vital signs and the nursing notes.  Pertinent labs & imaging results that were available during my care of the patient were  reviewed by me and considered in my medical decision making (see chart for details).     MDM: 1.  Pelvic pain in female-Advised patient please go to [facility] ED now for further evaluation including transvaginal ultrasound.  Patient agreed and verbalized understanding of these instructions and this plan of care today. 2.  Glucosuria-UA revealed above, POCT glucose 133 prior to discharge.  Patient discharged to ED.  Hemodynamically stable Final Clinical Impressions(s) / UC Diagnoses   Final diagnoses:  Pelvic pain in female  Glucosuria     Discharge Instructions  Advised patient please go to [facility] ED now for further evaluation including transvaginal ultrasound.     ED Prescriptions   None    PDMP not reviewed this encounter.   Teddy Sharper, FNP 04/16/24 1903    Teddy Sharper, FNP 04/16/24 [phone]

## 2024-04-16 NOTE — ED Triage Notes (Addendum)
 Patient coming to ED for evaluation of vaginal pain, vaginal discharge, back pain, and dysuria.  Reports symptoms started one month ago.  Was dx with BV.  Has had 2 courses of oral antibiotics without improvement in symptoms.  No reports of vaginal bleeding. Pt scheduled for hysterectomy in July.

## 2024-04-16 NOTE — ED Provider Notes (Signed)
 Elizabeth [name]   CSN: [id number] Arrival date & time: 04/16/24  8079     Patient presents with: Vaginal Discharge and Back Pain   Elizabeth [name] is a 48 y.o. female.   HPI Patient reports that she is having worsening pelvic and vaginal pain for about a month.  She reports she is taken antibiotics twice for bacterial vaginosis.  Patient reports that she has not been sexually active in about 5 years.  She reports that she is getting discomfort with urinating and a lot of pressure.  She also has pain all across her lower back.  Patient reports that she has had decreased appetite but no fever and no vomiting.  Patient is scheduled for hysterectomy in July.  Patient was seen at urgent care today and recommended to come to the emergency department for further evaluation.    Prior to Admission medications   Medication Sig Start Date End Date Taking? Authorizing Provider  Accu-Chek Softclix Lancets lancets 4 (four) times daily. 08/29/23   [provider]  Blood Glucose Monitoring Suppl DEVI 1 each by Does not apply route in the morning, at noon, and at bedtime. May substitute to any manufacturer covered by patient's insurance. 08/29/23   O'Sullivan, Melissa, NP  CALCIUM -VITAMIN D  PO Take 2 tablets by mouth every evening.    [provider]  Continuous Glucose Sensor (DEXCOM G7 SENSOR) MISC Use to check glucose continuously, change sensor every 10 days Patient not taking: Reported on 03/04/2024 11/12/23   Motwani, Komal, MD  dapagliflozin  propanediol (FARXIGA ) 10 MG TABS tablet Take 1 tablet (10 mg total) by mouth daily before breakfast. 10/03/23   Motwani, Obadiah, MD  Dulaglutide  (TRULICITY ) 3 MG/0.5ML SOAJ Inject 3 mg as directed once a week. 04/09/24   Motwani, Komal, MD  escitalopram  (LEXAPRO ) 20 MG tablet Take 1 tablet (20 mg total) by mouth daily. 04/02/24   O'Sullivan, Melissa, NP  esomeprazole  (NEXIUM ) 40 MG capsule Take  1 capsule (40 mg total) by mouth 2 (two) times daily. Open capsule and sprinkle on food at breakfast and dinner for better absorption. 08/12/23     famotidine  (PEPCID ) 20 MG tablet Take 1 tablet (20 mg total) by mouth 2 (two) times daily. 04/02/24   O'Sullivan, Melissa, NP  gabapentin  (NEURONTIN ) 300 MG capsule Take 1 capsule (300 mg total) by mouth 3 (three) times daily. 03/18/24   O'Sullivan, Melissa, NP  insulin  lispro (HUMALOG  KWIKPEN) 100 UNIT/ML KwikPen Inject 3-7 Units into the skin 3 (three) times daily. 3 units if blood sugar is more than 200, 5 units if blood sugar is more than 250, 7 units if blood sugar is more than 300 10/03/23 04/09/24  Motwani, Obadiah, MD  Lancets Misc. (ACCU-CHEK SOFTCLIX LANCET DEV) KIT USE MORNING, NOON, AND AT BEDTIME 04/15/24   O'Sullivan, Melissa, NP  lisinopril  (ZESTRIL ) 10 MG tablet Take 1 tablet (10 mg total) by mouth daily. 04/09/24   Motwani, Komal, MD  metFORMIN  (GLUCOPHAGE -XR) 500 MG 24 hr tablet Take 2 tablets (1,000 mg total) by mouth 2 (two) times daily with a meal. 03/24/24   Motwani, Komal, MD  methocarbamol  (ROBAXIN ) 500 MG tablet Take 1 tablet (500 mg total) by mouth every 8 (eight) hours as needed for muscle spasms. 04/15/24   Arvell Lye R, DO  metroNIDAZOLE  (FLAGYL ) 500 MG tablet Take 1 tablet (500 mg total) by mouth 2 (two) times daily. 04/04/24   Cleatus Moccasin, MD  Multiple Vitamin (MULTIVITAMIN WITH MINERALS) TABS  tablet Take 2 tablets by mouth every evening.    [provider]  naloxone [facility]) nasal spray 4 mg/0.1 mL Place 1 spray into the nose as needed. 07/30/23   [provider]  ondansetron  (ZOFRAN -ODT) 4 MG disintegrating tablet Take 1 tablet (4 mg total) by mouth every 8 (eight) hours as needed for nausea or vomiting. 04/14/24   O'Sullivan, Melissa, NP  oxyCODONE -acetaminophen  (PERCOCET) 7.5-325 MG tablet Take 1 tablet by mouth 4 (four) times daily as needed.    [provider]  rosuvastatin  (CRESTOR ) 20 MG tablet Take  1 tablet (20 mg total) by mouth daily. 04/09/24   Motwani, Komal, MD  spironolactone  (ALDACTONE ) 50 MG tablet Take 1 tablet (50 mg total) by mouth 2 (two) times daily. Will start with 50 mg twice daily, and increase to 100 mg twice daily as needed 03/16/24   Cleatus Moccasin, MD  traZODone  (DESYREL ) 50 MG tablet Take 1 tablet (50 mg total) by mouth at bedtime. 04/13/24   O'Sullivan, Melissa, NP    Allergies: Nsaids and Tomato    Review of Systems  Updated Vital Signs BP (!) 142/92 (BP Location: Left Arm)   Pulse 84   Temp 97.9 F (36.6 C)   Resp 14   Ht 5' 3 (1.6 m)   Wt 114.3 kg   SpO2 96%   BMI 44.64 kg/m   Physical Exam Constitutional:      Comments: Alert nontoxic.  Slightly tearful.  No respiratory distress.  HENT:     Head: Normocephalic and atraumatic.     Mouth/Throat:     Mouth: Mucous membranes are moist.     Pharynx: Oropharynx is clear.   Eyes:     Extraocular Movements: Extraocular movements intact.    Cardiovascular:     Rate and Rhythm: Normal rate and regular rhythm.  Pulmonary:     Effort: Pulmonary effort is normal.     Breath sounds: Normal breath sounds.  Abdominal:     Comments: Patient endorses moderate pain to palpation in the suprapubic area.  No guarding.  She also endorses pain in the upper abdomen diffusely to palpation.  Positive bilateral CVA tenderness.   Musculoskeletal:        General: No swelling or tenderness. Normal range of motion.     Right lower leg: No edema.     Left lower leg: No edema.   Skin:    General: Skin is warm and dry.   Neurological:     General: No focal deficit present.     Mental Status: She is oriented to person, place, and time.     Motor: No weakness.     Coordination: Coordination normal.   Psychiatric:        Mood and Affect: Mood normal.     (all labs ordered are listed, but only abnormal results are displayed) Labs Reviewed  LIPASE, BLOOD - Abnormal; Notable for the following components:       Result Value   Lipase 65 (*)    All other components within normal limits  COMPREHENSIVE METABOLIC PANEL WITH GFR - Abnormal; Notable for the following components:   CO2 21 (*)    Glucose, Bld 115 (*)    Anion gap 16 (*)    All other components within normal limits  CBC - Abnormal; Notable for the following components:   RBC 5.28 (*)    MCV 75.0 (*)    MCH 23.7 (*)    All other components within normal limits  WET PREP, GENITAL  HCG, SERUM, QUALITATIVE  URINALYSIS, ROUTINE W REFLEX MICROSCOPIC  RPR  GC/CHLAMYDIA PROBE AMP ([place]) NOT AT [employer]    EKG: None  Radiology: No results found.   Procedures   Medications Ordered in the ED  sodium chloride  0.9 % bolus 1,000 mL (has no administration in time range)  HYDROmorphone  (DILAUDID ) injection 1 mg (has no administration in time range)  ondansetron  (ZOFRAN ) injection 4 mg (has no administration in time range)                                    Medical Decision Making Amount and/or Complexity of Data Reviewed Labs: ordered. Radiology: ordered.  Risk Prescription drug management.   Patient presents as outlined.  She is reporting almost a months worth of severe lower abdominal pain.  She has also been treated for BV twice per her report.  Patient denies sexual activity for 5 years.  There should be lower probability for PID.  At this time with worsening lower abdominal pain over a month and including back pain and flank pain we will proceed with CT abdomen to rule out other etiologies that is possibly diverticulitis\partial bowel obstruction\fibroid.  Will treat for pain with Dilaudid  and Zofran  for nausea and initiate hydration.  White count 7.5 hemoglobin 12.5 platelets 305 lipase 65 metabolic panel normal with normal LFTs GFR greater than 60 blood glucose 115.  Pregnancy negative.  Urinalysis negative nitrate negative leuk esterase 0-5 WBC  CT scan interpreted by radiology no acute findings.  At this time  patient had fairly persistent lower abdominal pain.  CT scan does not show any acute or surgical findings.  Diagnostic workup is stable.  Patient is being seen by gynecology and is scheduled for hysterectomy coming in early July.  She reports this is for heavy vaginal bleeding.  Patient shows no signs of anemia at this time.  Patient is not sexually active.  She reports her husband died 5 years ago and she has not had any sexual activity since that time.  At this time no apparent risk for pelvic infection.  Patient has been treated for bacterial vaginosis a couple of times and is working with GYN.  Appropriate for continued outpatient management.     Final diagnoses:  None    ED Discharge Orders     None          Armenta Canning, MD 04/16/24 2319

## 2024-04-17 ENCOUNTER — Other Ambulatory Visit: Payer: Self-pay

## 2024-04-17 ENCOUNTER — Emergency Department (HOSPITAL_COMMUNITY)
Admission: EM | Admit: 2024-04-17 | Discharge: 2024-04-17 | Discharge status: Against Medical Advice | Attending: Emergency Medicine | Admitting: Emergency Medicine

## 2024-04-17 ENCOUNTER — Encounter (HOSPITAL_COMMUNITY): Payer: Self-pay | Admitting: *Deleted

## 2024-04-17 DIAGNOSIS — Z5321 Procedure and treatment not carried out due to patient leaving prior to being seen by health care provider: Secondary | ICD-10-CM | POA: Insufficient documentation

## 2024-04-17 DIAGNOSIS — R102 Pelvic and perineal pain: Secondary | ICD-10-CM | POA: Insufficient documentation

## 2024-04-17 DIAGNOSIS — R109 Unspecified abdominal pain: Secondary | ICD-10-CM | POA: Diagnosis present

## 2024-04-17 LAB — COMPREHENSIVE METABOLIC PANEL WITH GFR
ALT: 24 U/L (ref 0–44)
AST: 26 U/L (ref 15–41)
Albumin: 3.7 g/dL (ref 3.5–5.0)
Alkaline Phosphatase: 59 U/L (ref 38–126)
Anion gap: 10 (ref 5–15)
BUN: 13 mg/dL (ref 6–20)
CO2: 21 mmol/L — ABNORMAL LOW (ref 22–32)
Calcium: 9.4 mg/dL (ref 8.9–10.3)
Chloride: 107 mmol/L (ref 98–111)
Creatinine, Ser: 0.94 mg/dL (ref 0.44–1.00)
GFR, Estimated: >60 mL/min (ref >60)
Glucose, Bld: 108 mg/dL — ABNORMAL HIGH (ref 70–99)
Potassium: 4.3 mmol/L (ref 3.5–5.1)
Sodium: 138 mmol/L (ref 135–145)
Total Bilirubin: 0.3 mg/dL (ref 0.0–1.2)
Total Protein: 7.3 g/dL (ref 6.5–8.1)

## 2024-04-17 LAB — URINALYSIS, ROUTINE W REFLEX MICROSCOPIC
Bacteria, UA: NONE SEEN
Glucose, UA: >=500 mg/dL — AB
Hgb urine dipstick: NEGATIVE
Ketones, ur: NEGATIVE mg/dL
Leukocytes,Ua: NEGATIVE
Nitrite: NEGATIVE
Protein, ur: 30 mg/dL — AB
Specific Gravity, Urine: >1.046 — ABNORMAL HIGH (ref 1.005–1.030)
pH: 5 (ref 5.0–8.0)

## 2024-04-17 LAB — CBC WITH DIFFERENTIAL/PLATELET
Abs Immature Granulocytes: 0.02 10*3/uL (ref 0.00–0.07)
Basophils Absolute: 0.1 10*3/uL (ref 0.0–0.1)
Basophils Relative: 1 %
Eosinophils Absolute: 0.1 10*3/uL (ref 0.0–0.5)
Eosinophils Relative: 2 %
HCT: 40.4 % (ref 36.0–46.0)
Hemoglobin: 12.3 g/dL (ref 12.0–15.0)
Immature Granulocytes: 0 %
Lymphocytes Relative: 34 %
Lymphs Abs: 2.5 10*3/uL (ref 0.7–4.0)
MCH: 23.4 pg — ABNORMAL LOW (ref 26.0–34.0)
MCHC: 30.4 g/dL (ref 30.0–36.0)
MCV: 77 fL — ABNORMAL LOW (ref 80.0–100.0)
Monocytes Absolute: 0.4 10*3/uL (ref 0.1–1.0)
Monocytes Relative: 6 %
Neutro Abs: 4.2 10*3/uL (ref 1.7–7.7)
Neutrophils Relative %: 57 %
Platelets: 312 10*3/uL (ref 150–400)
RBC: 5.25 MIL/uL — ABNORMAL HIGH (ref 3.87–5.11)
RDW: 14.2 % (ref 11.5–15.5)
WBC: 7.3 10*3/uL (ref 4.0–10.5)
nRBC: 0 % (ref 0.0–0.2)

## 2024-04-17 LAB — LIPASE, BLOOD: Lipase: 53 U/L — ABNORMAL HIGH (ref 11–51)

## 2024-04-17 LAB — HCG, SERUM, QUALITATIVE: Preg, Serum: NEGATIVE

## 2024-04-17 NOTE — ED Triage Notes (Signed)
 Abd pain for 3 months she was seen at   med center yesterday for the same and she has also been seen at a urgent care  lmp 3 months

## 2024-04-17 NOTE — ED Notes (Signed)
 Pt decided to leave while waiting for a room.

## 2024-04-17 NOTE — ED Provider Triage Note (Signed)
 Emergency Medicine Provider Triage Evaluation Note  Elizabeth [name] , a 48 y.o. female  was evaluated in triage.  Pt complains of abdominal pain.  States she has been having abdominal pain for a year and a half without a clear cause of symptoms.  Started having vaginal pain 1 month ago.  She has been seen several times for this without a clear cause.  Was seen yesterday with normal workup, returns due to continued pain.  Review of Systems  Positive:  Negative:   Physical Exam  BP 110/74 (BP Location: Right Arm)   Pulse 87   Temp 98.5 F (36.9 C)   Resp 17   Ht 5' 3 (1.6 m)   Wt 114.3 kg   SpO2 99%   BMI 44.64 kg/m  Gen:   Awake, no distress   Resp:  Normal effort  MSK:   Moves extremities without difficulty  Other:    Medical Decision Making  Medically screening exam initiated at 7:30 PM.  Appropriate orders placed.  Elizabeth [name] was informed that the remainder of the evaluation will be completed by another provider, this initial triage assessment does not replace that evaluation, and the importance of remaining in the ED until their evaluation is complete.     Elizabeth [name] LABOR, PA-C 04/17/24 1932

## 2024-04-18 LAB — GC/CHLAMYDIA PROBE AMP: Chlamydia, Swab/Urine, PCR: NEGATIVE

## 2024-04-18 LAB — RPR: RPR Ser Ql: NONREACTIVE

## 2024-04-19 ENCOUNTER — Encounter: Payer: Self-pay | Admitting: Obstetrics and Gynecology

## 2024-04-19 MED ORDER — FAMOTIDINE 20 MG PO TABS: 20.0000 mg | ORAL_TABLET | Freq: Two times a day (BID) | ORAL | 1 refills | Status: AC

## 2024-04-19 MED ORDER — GABAPENTIN 300 MG PO CAPS: 300.0000 mg | ORAL_CAPSULE | Freq: Three times a day (TID) | ORAL | 1 refills | Status: DC

## 2024-04-19 MED ORDER — ROSUVASTATIN CALCIUM 20 MG PO TABS: 20.0000 mg | ORAL_TABLET | Freq: Every day | ORAL | 3 refills | Status: AC

## 2024-04-19 MED ORDER — TRULICITY 3 MG/0.5ML ~~LOC~~ SOAJ: 3.0000 mg | SUBCUTANEOUS | 0 refills | Status: DC

## 2024-04-19 MED ORDER — METHOCARBAMOL 500 MG PO TABS: 500.0000 mg | ORAL_TABLET | Freq: Three times a day (TID) | ORAL | 1 refills | Status: DC | PRN

## 2024-04-19 MED ORDER — ESCITALOPRAM OXALATE 20 MG PO TABS: 20.0000 mg | ORAL_TABLET | Freq: Every day | ORAL | 1 refills | Status: DC

## 2024-04-19 MED ORDER — METFORMIN HCL ER 500 MG PO TB24
1000.0000 mg | ORAL_TABLET | Freq: Two times a day (BID) | ORAL | 0 refills | Status: DC
Start: 2024-04-19 — End: 2024-06-24

## 2024-04-19 MED ORDER — INSULIN LISPRO (1 UNIT DIAL) 100 UNIT/ML (KWIKPEN): 3.0000 [IU] | PEN_INJECTOR | Freq: Three times a day (TID) | SUBCUTANEOUS | 1 refills | Status: DC

## 2024-04-19 MED ORDER — DAPAGLIFLOZIN PROPANEDIOL 10 MG PO TABS
10.0000 mg | ORAL_TABLET | Freq: Every day | ORAL | 1 refills | Status: DC
Start: 2024-04-19 — End: 2024-09-02

## 2024-04-19 NOTE — Addendum Note (Signed)
 Addended by: ARLOA JEOFFREY SAILOR on: 04/19/2024 02:50 PM   Modules accepted: Orders

## 2024-04-19 NOTE — Addendum Note (Signed)
 Addended by: ARLOA JEOFFREY SAILOR on: 04/19/2024 08:50 AM   Modules accepted: Orders

## 2024-04-20 ENCOUNTER — Telehealth: Payer: Self-pay | Admitting: Family

## 2024-04-20 ENCOUNTER — Ambulatory Visit (HOSPITAL_COMMUNITY): Payer: Self-pay

## 2024-04-20 ENCOUNTER — Telehealth (INDEPENDENT_AMBULATORY_CARE_PROVIDER_SITE_OTHER): Admitting: Family

## 2024-04-20 DIAGNOSIS — N760 Acute vaginitis: Secondary | ICD-10-CM | POA: Diagnosis not present

## 2024-04-20 DIAGNOSIS — B9689 Other specified bacterial agents as the cause of diseases classified elsewhere: Secondary | ICD-10-CM | POA: Insufficient documentation

## 2024-04-20 DIAGNOSIS — N3000 Acute cystitis without hematuria: Secondary | ICD-10-CM | POA: Insufficient documentation

## 2024-04-20 DIAGNOSIS — R809 Proteinuria, unspecified: Secondary | ICD-10-CM | POA: Insufficient documentation

## 2024-04-20 DIAGNOSIS — R748 Abnormal levels of other serum enzymes: Secondary | ICD-10-CM | POA: Diagnosis not present

## 2024-04-20 LAB — CERVICOVAGINAL ANCILLARY ONLY
Bacterial Vaginitis (gardnerella): POSITIVE — AB
Candida Glabrata: NEGATIVE
Candida Vaginitis: NEGATIVE
Chlamydia: NEGATIVE
Comment: NEGATIVE
Comment: NEGATIVE
Comment: NEGATIVE
Comment: NEGATIVE
Comment: NEGATIVE
Comment: NORMAL
Neisseria Gonorrhea: NEGATIVE
Trichomonas: NEGATIVE

## 2024-04-20 MED ORDER — CEPHALEXIN 500 MG PO CAPS: 500.0000 mg | ORAL_CAPSULE | Freq: Two times a day (BID) | ORAL | Status: AC

## 2024-04-20 MED ORDER — METRONIDAZOLE 0.75 % VA GEL: 1.0000 | Freq: Every day | VAGINAL | 0 refills | Status: AC

## 2024-04-20 NOTE — Telephone Encounter (Signed)
 Patient scheduled to come in tomorrow am

## 2024-04-20 NOTE — Assessment & Plan Note (Signed)
 Recurrent. She plans to begin metrogel  per GYN recs.

## 2024-04-20 NOTE — Progress Notes (Signed)
 Subjective:     Patient ID: Elizabeth [name], female    DOB: 1976-05-30, 48 y.o.   MRN: [id number]  No chief complaint on file.   HPI  Discussed the use of AI scribe software for clinical note transcription with the patient, who gave verbal consent to proceed.  History of Present Illness  Elizabeth [name] is a 48 year old female who presents for follow-up after an ER visit for urinary tract issues.  She experienced lower abdominal pain leading to an Urgent care visit on 6/27. [facility] referred her to [facility] ED that same day and CT was performed which showed fatty liver, and diverticulosis. No acute findings.  She continued to have symptoms and presented to [facility] ED on 6/29.  Testing performed during her visit to Urgent care showed BV.  She was prescribed metrogel .  Previous treatments with metronidazole  pills were ineffective.The ED recommended keflex for presumed UTI.   Her urinalysis showed protein, WBC and glucose. She is currently taking Farxiga  and was advised to reduce her lisinopril  dosage from 10 mg to 5 mg due to low blood pressure, but she is running low on her current supply.  She reports a history of elevated lipase levels, which were normal during the last two checks. She experiences upper abdominal pain, described as tightening and hurting, and uses heat and ice for relief. She is scheduled for a hysterectomy on the 21st of this month.     Health Maintenance Due  Topic Date Due   HPV VACCINES (1 - 3-dose SCDM series) Never done   OPHTHALMOLOGY EXAM  12/12/2022   COVID-19 Vaccine (4 - 2024-25 season) 06/22/2023    Past Medical History:  Diagnosis Date   Anemia    Anxiety    Astigmatism    Depression    Diabetes mellitus without complication (HCC)    Dry mouth    Fatigue    GERD (gastroesophageal reflux disease)    Heartburn    Hepatic steatosis    Hepatomegaly    High cholesterol    Hypertension    Keratoconjunctivitis    Liver disease     Muscle pain    Nausea and vomiting 04/21/2023   Ovarian cyst    Positive ANA (antinuclear antibody)    Pulmonary nodules    Stomach ulcer    Vitamin D  deficiency    Weakness     Past Surgical History:  Procedure Laterality Date   BIOPSY  01/18/2021   Procedure: BIOPSY;  Surgeon: San Sandor GAILS, DO;  Location: [facility];  Service: Gastroenterology;;   BIOPSY  10/28/2023   Procedure: BIOPSY;  Surgeon: Lyndel Deward PARAS, MD;  Location: [facility];  Service: General;;   CHOLECYSTECTOMY N/A 05/11/2021   Procedure: LAPAROSCOPIC CHOLECYSTECTOMY WITH INTRAOPERATIVE CHOLANGIOGRAM;  Surgeon: Lyndel Deward PARAS, MD;  Location: [facility];  Service: General;  Laterality: N/A;   DILATION AND CURETTAGE OF UTERUS     ENDOMETRIAL BIOPSY  12/19/2017   normal per pt   ESOPHAGOGASTRODUODENOSCOPY N/A 10/28/2023   Procedure: UPPER ENDOSCOPY;  Surgeon: Lyndel Deward PARAS, MD;  Location: [facility];  Service: General;  Laterality: N/A;   ESOPHAGOGASTRODUODENOSCOPY (EGD) WITH PROPOFOL  N/A 01/18/2021   Procedure: ESOPHAGOGASTRODUODENOSCOPY (EGD) WITH PROPOFOL ;  Surgeon: San Sandor GAILS, DO;  Location: [facility];  Service: Gastroenterology;  Laterality: N/A;   HIATAL HERNIA REPAIR N/A 09/30/2022   Procedure: HERNIA REPAIR HIATAL;  Surgeon: Lyndel Deward PARAS, MD;  Location: [facility];  Service: General;  Laterality: N/A;  LAPAROSCOPIC INSERTION GASTROSTOMY TUBE N/A 12/02/2022   Procedure: LAPAROSCOPIC REMNANT GASTROSTOMY G TUBE;  Surgeon: Lyndel Deward PARAS, MD;  Location: [facility];  Service: General;  Laterality: N/A;   LAPAROSCOPIC ROUX-EN-Y GASTRIC BYPASS WITH HIATAL HERNIA REPAIR  09/30/2022   LAPAROSCOPY N/A 12/02/2022   Procedure: LAPAROSCOPY DIAGNOSTIC;  Surgeon: Lyndel Deward PARAS, MD;  Location: [facility];  Service: General;  Laterality: N/A;   TONSILLECTOMY     TUBAL LIGATION     UPPER GI ENDOSCOPY N/A 09/30/2022   Procedure: UPPER GI ENDOSCOPY;  Surgeon: Lyndel Deward PARAS, MD;   Location: [facility];  Service: General;  Laterality: N/A;   UPPER GI ENDOSCOPY N/A 12/02/2022   Procedure: UPPER ENDOSCOPY;  Surgeon: Lyndel Deward PARAS, MD;  Location: [facility];  Service: General;  Laterality: N/A;    Family History  Problem Relation Age of Onset   Diabetes Mother    Hypertension Mother    Hyperlipidemia Mother    Obesity Mother    Peripheral Artery Disease Mother    HIV Father        died from complications (IVDU)   Alcohol abuse Father    Drug abuse Father    Lupus Maternal Aunt    Ovarian cancer Maternal Grandmother    Colon cancer Neg Hx    Esophageal cancer Neg Hx    Liver disease Neg Hx    Pancreatic cancer Neg Hx    Stomach cancer Neg Hx    Sleep apnea Neg Hx    Rectal cancer Neg Hx     Social History   Socioeconomic History   Marital status: Widowed    Spouse name: Francis   Number of children: 2   Years of education: Not on file   Highest education level: Associate degree: occupational, Scientist, product/process development, or vocational program  Occupational History   Occupation: CMA - Geophysicist/field seismologist: [employer]  Tobacco Use   Smoking status: Former    Current packs/day: 0.00    Average packs/day: 0.5 packs/day for 3.0 years (1.5 ttl pk-yrs)    Types: Cigarettes    Start date: 08/21/2018    Quit date: 08/21/2021    Years since quitting: 2.6    Passive exposure: Never   Smokeless tobacco: Never   Tobacco comments:    4-5/day  Vaping Use   Vaping status: Never Used  Substance and Sexual Activity   Alcohol use: No   Drug use: No   Sexual activity: Yes    Partners: Male    Birth control/protection: Surgical, Implant  Other Topics Concern   Not on file  Social History Narrative    2 children   1997- son Harden   2000- son Designer, jewellery in [place] [facility]- CMA in [place]   Enjoys reading   Widowed 08/02/2024, husband died from COVID-19.   Social Drivers of Corporate investment banker Strain: High Risk (04/01/2024)   Overall Financial  Resource Strain (CARDIA)    Difficulty of Paying Living Expenses: Very hard  Food Insecurity: Low Risk  (04/13/2024)   Received from [facility]   Hunger Vital Sign    Within the past 12 months, you worried that your food would run out before you got money to buy more: Never true    Within the past 12 months, the food you bought just didn't last and you didn't have money to get more. : Never true  Transportation Needs: No Transportation Needs (04/13/2024)   Received from [facility]  Transportation    In the past 12 months, has lack of reliable transportation kept you from medical appointments, meetings, work or from getting things needed for daily living? : No  Physical Activity: Inactive (04/01/2024)   Exercise Vital Sign    Days of Exercise per Week: 0 days    Minutes of Exercise per Session: Not on file  Stress: Stress Concern Present (04/01/2024)   [employer] of Occupational Health - Occupational Stress Questionnaire    Feeling of Stress: Rather much  Social Connections: Moderately Integrated (04/01/2024)   Social Connection and Isolation Panel    Frequency of Communication with Friends and Family: More than three times a week    Frequency of Social Gatherings with Friends and Family: Never    Attends Religious Services: 1 to 4 times per year    Active Member of [employer] or Organizations: Yes    Attends Banker Meetings: 1 to 4 times per year    Marital Status: Widowed  Recent Concern: Social Connections - Moderately Isolated (01/14/2024)   Social Connection and Isolation Panel    Frequency of Communication with Friends and Family: Three times a week    Frequency of Social Gatherings with Friends and Family: Never    Attends Religious Services: 1 to 4 times per year    Active Member of [employer] or Organizations: No    Attends Banker Meetings: Not on file    Marital Status: Widowed  Intimate Partner Violence: Not At Risk (12/02/2022)   Humiliation,  Afraid, Rape, and Kick questionnaire    Fear of Current or Ex-Partner: No    Emotionally Abused: No    Physically Abused: No    Sexually Abused: No    Outpatient Medications Prior to Visit  Medication Sig Dispense Refill   Accu-Chek Softclix Lancets lancets 4 (four) times daily.     Blood Glucose Monitoring Suppl DEVI 1 each by Does not apply route in the morning, at noon, and at bedtime. May substitute to any manufacturer covered by patient's insurance. 1 each 0   CALCIUM -VITAMIN D  PO Take 2 tablets by mouth every evening.     dapagliflozin  propanediol (FARXIGA ) 10 MG TABS tablet Take 1 tablet (10 mg total) by mouth daily before breakfast. 90 tablet 1   Dulaglutide  (TRULICITY ) 3 MG/0.5ML SOAJ Inject 3 mg as directed once a week. 6 mL 0   escitalopram  (LEXAPRO ) 20 MG tablet Take 1 tablet (20 mg total) by mouth daily. 90 tablet 1   esomeprazole  (NEXIUM ) 40 MG capsule Take 1 capsule (40 mg total) by mouth 2 (two) times daily. Open capsule and sprinkle on food at breakfast and dinner for better absorption. 180 capsule 3   famotidine  (PEPCID ) 20 MG tablet Take 1 tablet (20 mg total) by mouth 2 (two) times daily. 180 tablet 1   gabapentin  (NEURONTIN ) 300 MG capsule Take 1 capsule (300 mg total) by mouth 3 (three) times daily. 270 capsule 1   insulin  lispro (HUMALOG  KWIKPEN) 100 UNIT/ML KwikPen Inject 3-7 Units into the skin 3 (three) times daily. 3 units if blood sugar is more than 200, 5 units if blood sugar is more than 250, 7 units if blood sugar is more than 300 9 mL 1   Lancets Misc. (ACCU-CHEK SOFTCLIX LANCET DEV) KIT USE MORNING, NOON, AND AT BEDTIME 1 kit 11   lisinopril  (ZESTRIL ) 10 MG tablet Take 1 tablet (10 mg total) by mouth daily. 90 tablet 0   metFORMIN  (GLUCOPHAGE -XR) 500 MG 24  hr tablet Take 2 tablets (1,000 mg total) by mouth 2 (two) times daily with a meal. 360 tablet 0   methocarbamol  (ROBAXIN ) 500 MG tablet Take 1 tablet (500 mg total) by mouth every 8 (eight) hours as needed  for muscle spasms. 90 tablet 1   metroNIDAZOLE  (METROGEL ) 0.75 % vaginal gel Place 1 Applicatorful vaginally at bedtime for 5 days. 50 g 0   Multiple Vitamin (MULTIVITAMIN WITH MINERALS) TABS tablet Take 2 tablets by mouth every evening.     naloxone (NARCAN) nasal spray 4 mg/0.1 mL Place 1 spray into the nose as needed.     ondansetron  (ZOFRAN -ODT) 4 MG disintegrating tablet Take 1 tablet (4 mg total) by mouth every 8 (eight) hours as needed for nausea or vomiting. 90 tablet 0   oxyCODONE -acetaminophen  (PERCOCET) 7.5-325 MG tablet Take 1 tablet by mouth 4 (four) times daily as needed.     rosuvastatin  (CRESTOR ) 20 MG tablet Take 1 tablet (20 mg total) by mouth daily. 90 tablet 3   spironolactone  (ALDACTONE ) 50 MG tablet Take 1 tablet (50 mg total) by mouth 2 (two) times daily. Will start with 50 mg twice daily, and increase to 100 mg twice daily as needed 60 tablet 2   traZODone  (DESYREL ) 50 MG tablet Take 1 tablet (50 mg total) by mouth at bedtime. 90 tablet 0   Continuous Glucose Sensor (DEXCOM G7 SENSOR) MISC Use to check glucose continuously, change sensor every 10 days (Patient not taking: Reported on 03/04/2024) 9 each 3   metroNIDAZOLE  (FLAGYL ) 500 MG tablet Take 1 tablet (500 mg total) by mouth 2 (two) times daily. 14 tablet 1   No facility-administered medications prior to visit.    Allergies  Allergen Reactions   Nsaids     Pt reported due to Bariatric surgery 09/2022   Tomato Hives, Itching and Rash    ROS    See HPI Objective:    Physical Exam   There were no vitals taken for this visit. Wt Readings from Last 3 Encounters:  04/17/24 251 lb 15.8 oz (114.3 kg)  04/16/24 252 lb (114.3 kg)  04/09/24 252 lb (114.3 kg)   Gen: Awake, alert, no acute distress Resp: Breathing is even and non-labored Psych: calm/pleasant demeanor Neuro: Alert and Oriented x 3, + facial symmetry, speech is clear.      Assessment & Plan:   Problem List Items Addressed This Visit        Unprioritized   Proteinuria - Primary   Continue ACE/Farxiga  for renal protection. Obtain 24 hour urine for protein.      Relevant Orders   Protein, urine, 24 hour   Elevated lipase   I advised her to reach out to her GI specialist for further recommendations on this. Her Lipase has been up/down over the last year.       Bacterial vaginosis   Recurrent. She plans to begin metrogel  per GYN recs.       Relevant Medications   cephALEXin (KEFLEX) 500 MG capsule   Acute cystitis without hematuria   Continues keflex. It is not clear if a urine culture was performed in the ED- none available for review. She will let me know if symptoms fail to improve.       Relevant Medications   cephALEXin (KEFLEX) 500 MG capsule    I have discontinued Fatimah Perman's Dexcom G7 Sensor. I am also having her start on cephALEXin. Additionally, I am having her maintain her multivitamin with minerals, CALCIUM -VITAMIN D  PO,  esomeprazole , Blood Glucose Monitoring Suppl, Accu-Chek Softclix Lancets, naloxone, oxyCODONE -acetaminophen , spironolactone , lisinopril , traZODone , ondansetron , Accu-Chek Softclix Lancet Dev, dapagliflozin  propanediol, metFORMIN , rosuvastatin , escitalopram , famotidine , gabapentin , Trulicity , insulin  lispro, methocarbamol , and metroNIDAZOLE .  Meds ordered this encounter  Medications   cephALEXin (KEFLEX) 500 MG capsule    Sig: Take 1 capsule (500 mg total) by mouth 2 (two) times daily for 5 days.    Supervising Provider:   DOMENICA BLACKBIRD A [phone]

## 2024-04-20 NOTE — Assessment & Plan Note (Signed)
 I advised her to reach out to her GI specialist for further recommendations on this. Her Lipase has been up/down over the last year.

## 2024-04-20 NOTE — Assessment & Plan Note (Signed)
 Continues keflex. It is not clear if a urine culture was performed in the ED- none available for review. She will let me know if symptoms fail to improve.

## 2024-04-20 NOTE — Assessment & Plan Note (Signed)
 Continue ACE/Farxiga  for renal protection. Obtain 24 hour urine for protein.

## 2024-04-20 NOTE — Patient Instructions (Addendum)
 VISIT SUMMARY:  You came in for a follow-up after an ER visit for urinary tract issues. You were diagnosed with a UTI and bacterial vaginosis, and you have been prescribed medications for both. We also discussed your elevated lipase levels, diabetes management, and hypertension. You are scheduled for a hysterectomy on the 21st of this month.  YOUR PLAN:  URINARY TRACT INFECTION (UTI): You were diagnosed with a UTI, and your urinalysis showed protein and ketones, likely due to diabetes and medication. -Continue taking cephalexin 500 mg twice a day for five days. -Monitor your symptoms and report if there is no improvement. -We will order a 24-hour urine collection to assess protein levels in your urine. -If protein levels are significantly elevated, we may refer you to a kidney specialist.  BACTERIAL VAGINOSIS (BV): You tested positive for BV, and previous treatments with metronidazole  pills were ineffective. -Start using MetroGel  as prescribed.  ELEVATED LIPASE: You have elevated lipase levels and upper abdominal pain. Previous consultations did not address this issue. -Contact a GI specialist to discuss your elevated lipase levels and associated symptoms.  DIABETES MELLITUS: You are on Farxiga , which is causing glucose in your urine. Your urinalysis results are consistent with the effects of this medication. -Continue taking Farxiga  as prescribed. -Maintain good control of your blood sugar levels.  HYPERTENSION: You were previously on lisinopril  10 mg but experienced low blood pressure. You have been advised to reduce the dosage to 5 mg. -Continue taking lisinopril  5 mg as prescribed.  FOLLOW-UP AND GENERAL HEALTH MAINTENANCE: You are scheduled for a hysterectomy on the 21st of this month and need follow-up for various conditions. -Ensure you are ready for your hysterectomy on the 21st. -Follow up if UTI symptoms persist after treatment. -Follow up with a GI specialist regarding your  elevated lipase levels.

## 2024-04-20 NOTE — Telephone Encounter (Signed)
 Can we please arrange a time for her come by and pick up 24 hour urine kit? Order has been placed.

## 2024-04-21 ENCOUNTER — Ambulatory Visit: Attending: Sports Medicine | Admitting: Physical Therapy

## 2024-04-21 ENCOUNTER — Other Ambulatory Visit

## 2024-04-21 ENCOUNTER — Other Ambulatory Visit: Payer: Self-pay

## 2024-04-21 ENCOUNTER — Encounter: Payer: Self-pay | Admitting: Family

## 2024-04-21 ENCOUNTER — Encounter: Payer: Self-pay | Admitting: Physical Therapy

## 2024-04-21 DIAGNOSIS — M25511 Pain in right shoulder: Secondary | ICD-10-CM | POA: Insufficient documentation

## 2024-04-21 DIAGNOSIS — M25611 Stiffness of right shoulder, not elsewhere classified: Secondary | ICD-10-CM | POA: Diagnosis present

## 2024-04-21 DIAGNOSIS — M6281 Muscle weakness (generalized): Secondary | ICD-10-CM | POA: Diagnosis present

## 2024-04-21 DIAGNOSIS — R293 Abnormal posture: Secondary | ICD-10-CM | POA: Insufficient documentation

## 2024-04-21 DIAGNOSIS — G8929 Other chronic pain: Secondary | ICD-10-CM | POA: Insufficient documentation

## 2024-04-21 DIAGNOSIS — M67911 Unspecified disorder of synovium and tendon, right shoulder: Secondary | ICD-10-CM | POA: Diagnosis not present

## 2024-04-21 DIAGNOSIS — R809 Proteinuria, unspecified: Secondary | ICD-10-CM

## 2024-04-21 DIAGNOSIS — F419 Anxiety disorder, unspecified: Secondary | ICD-10-CM

## 2024-04-21 MED ORDER — HYDROXYZINE PAMOATE 25 MG PO CAPS: 25.0000 mg | ORAL_CAPSULE | Freq: Three times a day (TID) | ORAL | 0 refills | Status: AC | PRN

## 2024-04-21 NOTE — Therapy (Signed)
 OUTPATIENT PHYSICAL THERAPY SHOULDER EVALUATION   Patient Name: Marieelena Bartko MRN: [id number] DOB:[birth year], 48 y.o., female Today's Date: 04/21/2024  END OF SESSION:  PT End of Session - 04/21/24 0846     Visit Number 1    Number of Visits 8    Date for PT Re-Evaluation 06/16/24    Authorization Type [employer]    Authorization Time Period AUTH pending    Authorization - Visit Number 1    PT Start Time [phone]    PT Stop Time 0915    PT Time Calculation (min) 29 min    Behavior During Therapy WFL for tasks assessed/performed           Past Medical History:  Diagnosis Date   Anemia    Anxiety    Astigmatism    Depression    Diabetes mellitus without complication (HCC)    Dry mouth    Fatigue    GERD (gastroesophageal reflux disease)    Heartburn    Hepatic steatosis    Hepatomegaly    High cholesterol    Hypertension    Keratoconjunctivitis    Liver disease    Muscle pain    Nausea and vomiting 04/21/2023   Ovarian cyst    Positive ANA (antinuclear antibody)    Pulmonary nodules    Stomach ulcer    Vitamin D  deficiency    Weakness    Past Surgical History:  Procedure Laterality Date   BIOPSY  01/18/2021   Procedure: BIOPSY;  Surgeon: San Sandor GAILS, DO;  Location: [facility];  Service: Gastroenterology;;   BIOPSY  10/28/2023   Procedure: BIOPSY;  Surgeon: Lyndel Deward PARAS, MD;  Location: [facility];  Service: General;;   CHOLECYSTECTOMY N/A 05/11/2021   Procedure: LAPAROSCOPIC CHOLECYSTECTOMY WITH INTRAOPERATIVE CHOLANGIOGRAM;  Surgeon: Lyndel Deward PARAS, MD;  Location: [facility];  Service: General;  Laterality: N/A;   DILATION AND CURETTAGE OF UTERUS     ENDOMETRIAL BIOPSY  12/19/2017   normal per pt   ESOPHAGOGASTRODUODENOSCOPY N/A 10/28/2023   Procedure: UPPER ENDOSCOPY;  Surgeon: Lyndel Deward PARAS, MD;  Location: [facility];  Service: General;  Laterality: N/A;   ESOPHAGOGASTRODUODENOSCOPY (EGD) WITH PROPOFOL  N/A 01/18/2021    Procedure: ESOPHAGOGASTRODUODENOSCOPY (EGD) WITH PROPOFOL ;  Surgeon: San Sandor GAILS, DO;  Location: [facility];  Service: Gastroenterology;  Laterality: N/A;   HIATAL HERNIA REPAIR N/A 09/30/2022   Procedure: HERNIA REPAIR HIATAL;  Surgeon: Lyndel Deward PARAS, MD;  Location: [facility];  Service: General;  Laterality: N/A;   LAPAROSCOPIC INSERTION GASTROSTOMY TUBE N/A 12/02/2022   Procedure: LAPAROSCOPIC REMNANT GASTROSTOMY G TUBE;  Surgeon: Lyndel Deward PARAS, MD;  Location: [facility];  Service: General;  Laterality: N/A;   LAPAROSCOPIC ROUX-EN-Y GASTRIC BYPASS WITH HIATAL HERNIA REPAIR  09/30/2022   LAPAROSCOPY N/A 12/02/2022   Procedure: LAPAROSCOPY DIAGNOSTIC;  Surgeon: Lyndel Deward PARAS, MD;  Location: [facility];  Service: General;  Laterality: N/A;   TONSILLECTOMY     TUBAL LIGATION     UPPER GI ENDOSCOPY N/A 09/30/2022   Procedure: UPPER GI ENDOSCOPY;  Surgeon: Lyndel Deward PARAS, MD;  Location: [facility];  Service: General;  Laterality: N/A;   UPPER GI ENDOSCOPY N/A 12/02/2022   Procedure: UPPER ENDOSCOPY;  Surgeon: Lyndel Deward PARAS, MD;  Location: [facility];  Service: General;  Laterality: N/A;   Patient Active Problem List   Diagnosis Date Noted   Elevated lipase 04/20/2024   Acute cystitis without hematuria 04/20/2024   Bacterial vaginosis 04/20/2024   Proteinuria 04/20/2024  Multiple gastric ulcers 11/17/2023   Insomnia 08/25/2023   Chronic gastritis without bleeding 04/11/2023   Bilateral shoulder pain 02/18/2023   History of gastric bypass 12/02/2022   Positive ANA (antinuclear antibody) 11/26/2022   Excessive bleeding in premenopausal period 11/11/2022   Osteoarthritis of knee 03/19/2022   Preventative health care 12/31/2021   Drug-induced constipation 12/31/2021   Depression 11/07/2021   Chronic abdominal pain 04/06/2021   Grief reaction 04/06/2021   Gastroesophageal reflux disease without esophagitis    Hyperlipidemia 01/21/2018   Uncontrolled type 2 diabetes  mellitus with hyperglycemia (HCC) 01/21/2018   Keratoconjunctivitis sicca of both eyes not specified as Sjogren's 04/04/2017   Regular astigmatism of both eyes 04/04/2017   Vitamin D  deficiency 12/02/2016   Essential hypertension 02/07/2016   Morbid obesity with BMI of 50.0-59.9, adult (HCC) 02/07/2016    PCP: Daryl Setter, NP  REFERRING PROVIDER: Arvell Evalene SAUNDERS, DO  REFERRING DIAG: [phone] (ICD-10-CM) - Tendinopathy of right rotator cuff   THERAPY DIAG:   Chronic right shoulder pain  Stiffness of right shoulder, not elsewhere classified  Muscle weakness (generalized)  Abnormal posture  RATIONALE FOR EVALUATION AND TREATMENT: Rehabilitation  ONSET DATE: Since December 2024  NEXT MD VISIT: 04/28/24   SUBJECTIVE:                                                                                                                                                                                      SUBJECTIVE STATEMENT: It's just been hurting so bad. Sometimes it hurts in different places, sometimes it hurts all over. Pt reports it feels like she can feel the pain in her shoulder blade. Radiates down to her elbow at times. Pain has slowly worsened over time. Found a tear in it with bursitis and calcification. Has been getting assistance for some of her upper body dressing from her mom. Pt states she had weight loss surgery and had to stop going to PT back in April. Has plans for a hysterectomy later this month.   01/22/24 - R shoulder pain that started off mild sometime in 2024 and gradually became worse by Dec 2024. Pain started to increase in R shoulder and is restricted in movement. Went to doctor and they stated calcification/bursitis in R shoulder and possible arthritis in both shoulders. Doesn't take NSAIDs or steroid injections due to GI complications.  Hand dominance: Right  PERTINENT HISTORY:  DM, GERD, HTN, depression, anxiety, anemia, weakness, muscle pain, morbid  obesity  PAIN:  Are you having pain? Yes: NPRS scale: 7/10 currently, 9/10 at worst Pain location: All of over shoulder Pain description: Dull, achy Aggravating factors: Sleeping, morning when first wake up, when  I'm doing something like household stuff Relieving factors: Voltaren gel, Heat, muscle relaxer, pain meds (oxycodone )  PRECAUTIONS: None  RED FLAGS: None   WEIGHT BEARING RESTRICTIONS: No  FALLS:  Has patient fallen in last 6 months? 1 fall last week losing her balance on to the bed  LIVING ENVIRONMENT: Lives with: lives with their family Lives in: House/apartment Stairs: Yes: External: 1 steps; none Has following equipment at home: Single point cane and Walker - 2 wheeled  OCCUPATION: Currently not working  PLOF: Independent and Leisure: Cook, read, and spend time with family  PATIENT GOALS: To be more mobile with arms   OBJECTIVE:  Note: Objective measures were completed at Evaluation (04/21/24) unless otherwise noted.  DIAGNOSTIC FINDINGS:  02/03/24 - MR R Shoulder IMPRESSION: 1. Chronic calcific tendinosis of the distal footprint of the anterior supraspinatus tendon. 2. Focal tendinosis versus a small/developing, chronic horizontal linear non fluid bright partial-thickness midsubstance tear of the far anterior supraspinatus tendon. No tendon retraction. 3. Mild tendinosis of the posterior supraspinatus and anterior infraspinatus interdigitating tendon footprint fibers. 4. Mild atrophy and fatty infiltration of the posterosuperior aspect of the teres minor muscle. 5. Mild degenerative changes of the acromioclavicular joint. 6. Mild glenohumeral cartilage degenerative changes.  11/20/23 - DG R Shoulder FINDINGS: There is no evidence of fracture or dislocation. Slight acromioclavicular spurring. Glenohumeral joint space is normal. No erosive change or focal bone abnormality. There is a 9 mm soft tissue calcification adjacent to the lateral humeral head.    IMPRESSION: A 9 mm soft tissue calcification adjacent to the lateral humerus may represent calcific tendinopathy or bursitis.  PATIENT SURVEYS:  Junie Palin   01/22/24: 79.5/100 = 79.5% 04/21/24: 88.63/100 = 88.63%  COGNITION: Overall cognitive status: Within functional limits for tasks assessed     SENSATION: WFL  POSTURE: Rounded shoulders, forward head, L shoulder slightly higher than R  UPPER EXTREMITY ROM:   Active ROM R 01/22/24 L 01/22/24 R 04/21/24 Eval L 04/21/24 Eval  Shoulder flexion 111 p! 145 p! 85 p! 155  Shoulder extension 38 p! Posterior shoulder 45 30 p! (Most pain) 50  Shoulder abduction 97 p! 129 p! (Dull) 102 p! 150  Shoulder horiz adduction Limited - sharp p!     Shoulder internal rotation 64 p! (Dull)  Side of hip T5  Shoulder external rotation 23 p! (Dull)  70 at 0 deg abd 70 at 0 deg abd  Elbow flexion      Elbow extension      Wrist flexion      Wrist extension      Wrist ulnar deviation      Wrist radial deviation      Wrist pronation      Wrist supination      (Blank rows = not tested, p! = pain)  UPPER EXTREMITY MMT: within available range  MMT R 01/22/24 L 01/22/24 R 04/06/24 Eval L 04/06/24 Eval  Shoulder flexion 2 3+ 2 4  Shoulder extension 3- 3+ p! 2 3+  Shoulder abduction 2 p! 3+ dull p! 3+ 4  Shoulder adduction      Shoulder internal rotation 3 p! 2 sharp p! 3 p! 3+  Shoulder external rotation 2 p! 3 dull p! 3 p! 3+  Middle trapezius      Lower trapezius      Elbow flexion      Elbow extension      Wrist flexion      Wrist extension  Wrist ulnar deviation      Wrist radial deviation      Wrist pronation      Wrist supination      Grip strength (lbs)      (Blank rows = not tested)  SHOULDER SPECIAL TESTS:  04/21/24 Impingement tests: Hawkins/Kennedy impingement test: positive  Rotator cuff assessment: Empty can test: positive   01/22/24: Impingement tests: Hawkins/Kennedy impingement test: positive  Rotator cuff assessment: Empty can  test: positive   PALPATION:  Taut and tender R upper trap, levator scap, mid/low trap, infraspinatus and pecs                                                                                                                             TREATMENT DATE:  04/21/2024 - Eval SELF CARE:  Reviewed eval findings and role of PT in addressing identified deficits as well as instruction in initial HEP (see below).   PATIENT EDUCATION:  Education details: PT eval findings, anticipated POC, progress with PT, and initial HEP  Person educated: Patient Education method: Explanation, Demonstration, and Handouts Education comprehension: verbalized understanding, returned demonstration, and needs further education   HOME EXERCISE PROGRAM: Access Code: [email or web address] URL: [email or web address] Date: 04/21/2024 Prepared by: Inge Waldroup April Earnie Starring  Exercises - Seated Upper Trapezius Stretch  - 2-3 x daily - 7 x weekly - 1 sets - 30 sec hold - Gentle Levator Scapulae Stretch (Mirrored)  - 2-3 x daily - 7 x weekly - 1 sets - 30 sec hold - Standing Shoulder Posterior Capsule Stretch  - 2-3 x daily - 7 x weekly - 1 sets - 30 sec hold - Seated Bilateral Shoulder Flexion Towel Slide at Table Top  - 2-3 x daily - 7 x weekly - 1 sets - 10 reps - Seated Shoulder Abduction Towel Slide at Table Top with Forearm in Neutral (Mirrored)  - 2-3 x daily - 7 x weekly - 1 sets - 10 reps - Seated Shoulder External Rotation AROM in Supported Abduction (Mirrored)  - 2-3 x daily - 7 x weekly - 1 sets - 10 reps  ASSESSMENT:  CLINICAL IMPRESSION: Fantasha Daniele is a 48 y.o. female who was referred to physical therapy for evaluation and treatment for tendinopathy of R rotator cuff.  She completed a previous PT eval on 01/22/2024, however did not return for any follow-up visits due to a weight loss surgery.  Patient reports worsening pain since Dec 2024. Pain is worse with active arm movements.  Patient has deficits in R  shoulder ROM, scapular and rotator cuff strength, abnormal posture, and TTP with abnormal muscle tension throughout UTs, pecs, and scapular muscles which are interfering with ADLs and are impacting quality of life.  On QuickDASH patient scored 88.63% disability.  Joletta will benefit from skilled PT to address above deficits to improve mobility and activity tolerance with decreased pain interference.   01/22/24 EVAL: Margarit is a 48 y.o. female who was seen today  for physical therapy evaluation and treatment for R shoulder pain due to Calcific tendinitis. R shoulder pain began sometime in 2024 that initially was mild but started to become increasingly worse by Dec 2024. She shows limitations due to pain in all planes of motion in R shoulder and shows some limitations in L shoulder but displayed better ROM in L>R. Lichelle was able to lift R arm at least 50% but the pain was too sharp, therefore, we were unable to conduct resisted MMT on that side.This impacts her ability to do ADLs, sleep and spend time with her family. Mentioned that she had to quit working due to other existing co-morbidities like GI complications. She does have AD in the house that she can use if she needs to. Adaiah will benefit from skilled PT to address above deficits and limitations to improve mobility and activity tolerance with decreased pain interference.   OBJECTIVE IMPAIRMENTS: decreased activity tolerance, decreased endurance, decreased knowledge of condition, decreased mobility, decreased ROM, decreased strength, increased fascial restrictions, impaired perceived functional ability, increased muscle spasms, impaired flexibility, impaired sensation, impaired tone, impaired UE functional use, improper body mechanics, postural dysfunction, and pain.   ACTIVITY LIMITATIONS: carrying, lifting, sleeping, bathing, dressing, reach over head, and hygiene/grooming  PARTICIPATION LIMITATIONS: meal prep, cleaning, laundry, shopping,  community activity, and occupation  PERSONAL FACTORS: Fitness, Past/current experiences, Time since onset of injury/illness/exacerbation, and 3+ comorbidities: DM, GERD, HTN, depression, anxiety, anemia, weakness, muscle pain, morbid obesityare also affecting patient's functional outcome.   REHAB POTENTIAL: Good  CLINICAL DECISION MAKING: Unstable/unpredictable  EVALUATION COMPLEXITY: High   GOALS: Goals reviewed with patient? Yes  SHORT TERM GOALS: Target date: 05/19/2024   Patient will be independent with initial HEP to improve outcomes and carryover.  Baseline: Has prior HEP but pt states she has not been consistent; new HEP provided based on today's eval Goal status: INITIAL  2.  Patient will report 25% improvement in R shoulder pain to improve QOL.   Baseline: 7/10 currently Goal status: INITIAL  3.  Patient will be able to increase R shoulder ROM 10-15 w/ decreased pain for functional UE use. Baseline: Refer to above UE ROM table Goal status: INITIAL  LONG TERM GOALS: Target date: 06/16/2024   Patient will be independent with ongoing/advanced HEP for self-management at home.  Baseline: Not currently consistent with an HEP Goal status: INITIAL  2.  Patient will report 50-75% improvement in R shoulder pain to improve QOL.  Baseline:  Goal status: INITIAL  3.  Patient to demonstrate improved upright posture with posterior shoulder girdle engaged to promote improved glenohumeral joint mobility. Baseline: Rounded shoulder posture Goal status: INITIAL  4.  Patient to improve R shoulder AROM to [facility] without pain provocation to allow for increased ease of ADLs.  Baseline: Refer to above UE ROM table Goal status: INITIAL  5.  Patient will demonstrate improved R shoulder strength to >/= 4/5 for functional UE use. Baseline: Refer to above UE MMT table Goal status: INITIAL  6  Patient will report </= 75% on QuickDASH (MCID = 14%) to demonstrate improved functional ability.   Baseline: 88.63% Goal status: INITIAL  7.  Patient will report the ability to sleep for at least 4-5 hours w/o increased pain.   Baseline: Severe difficulty to sleep - QuickDASH Goal status: INITIAL   8. Pt will be able to do ADLs and household related tasks w/o limitations due to R shoulder pain, ROM, and weakness Baseline:  Unable to do ADLs/household chores -  QuickDASH Goal status: INITIAL   PLAN:  PT FREQUENCY: 1x/week Per Dr. Arvell  PT DURATION: 8 weeks  PLANNED INTERVENTIONS: 02835- PT Re-evaluation, 97750- Physical Performance Testing, 97110-Therapeutic exercises, 97530- Therapeutic activity, 97112- Neuromuscular re-education, 97535- Self Care, 02859- Manual therapy, [phone]- Aquatic Therapy, [phone]- Electrical stimulation (unattended), 97016- Vasopneumatic device, [id number]- Ultrasound, [id number]- Ionotophoresis 4mg /ml Dexamethasone , 79439 (1-2 muscles), 20561 (3+ muscles)- Dry Needling, Patient/Family education, Taping, Joint mobilization, Cryotherapy, and Moist heat  PLAN FOR NEXT SESSION: Review initial HEP, postural correction/scapular strengthening, introduce flexibility/ROM & UE strengthening exercises - based on pt tolerance   Stpehanie Montroy April Ma L Dechelle Attaway, PT 04/21/2024, 9:28 AM

## 2024-04-21 NOTE — Addendum Note (Signed)
 Addended by: TRUDY CURVIN RAMAN on: 04/21/2024 09:41 AM   Modules accepted: Orders

## 2024-04-26 ENCOUNTER — Encounter: Payer: Self-pay | Admitting: Obstetrics and Gynecology

## 2024-04-26 ENCOUNTER — Ambulatory Visit: Admitting: "Endocrinology

## 2024-04-26 NOTE — Progress Notes (Unsigned)
 GYNECOLOGY OFFICE VISIT NOTE  History:  Elizabeth [name] is a 48 y.o. [id number] here today for preop visit and ongoing monitoring for her BV. She was also seen in the ED on 6/29 through [facility] and was diagnosed with a UTI. She was prescribed antibiotics and has felt improvement since taking them. She still has some back pain. .   She still has some vaginal discharge.   She has a hysterectomy scheduled for 7/21 due to heavy bleeding.   Past Medical History:  Diagnosis Date   Anemia    Anxiety    Astigmatism    Depression    Diabetes mellitus without complication (HCC)    Dry mouth    Fatigue    GERD (gastroesophageal reflux disease)    Heartburn    Hepatic steatosis    Hepatomegaly    High cholesterol    Hypertension    Keratoconjunctivitis    Liver disease    Muscle pain    Nausea and vomiting 04/21/2023   Ovarian cyst    Positive ANA (antinuclear antibody)    Pulmonary nodules    Stomach ulcer    Vitamin D  deficiency    Weakness     Past Surgical History:  Procedure Laterality Date   BIOPSY  01/18/2021   Procedure: BIOPSY;  Surgeon: San Sandor GAILS, DO;  Location: [facility];  Service: Gastroenterology;;   BIOPSY  10/28/2023   Procedure: BIOPSY;  Surgeon: Lyndel Deward PARAS, MD;  Location: [facility];  Service: General;;   CHOLECYSTECTOMY N/A 05/11/2021   Procedure: LAPAROSCOPIC CHOLECYSTECTOMY WITH INTRAOPERATIVE CHOLANGIOGRAM;  Surgeon: Lyndel Deward PARAS, MD;  Location: [facility];  Service: General;  Laterality: N/A;   DILATION AND CURETTAGE OF UTERUS     ENDOMETRIAL BIOPSY  12/19/2017   normal per pt   ESOPHAGOGASTRODUODENOSCOPY N/A 10/28/2023   Procedure: UPPER ENDOSCOPY;  Surgeon: Lyndel Deward PARAS, MD;  Location: [facility];  Service: General;  Laterality: N/A;   ESOPHAGOGASTRODUODENOSCOPY (EGD) WITH PROPOFOL  N/A 01/18/2021   Procedure: ESOPHAGOGASTRODUODENOSCOPY (EGD) WITH PROPOFOL ;  Surgeon: San Sandor GAILS, DO;  Location: [facility];   Service: Gastroenterology;  Laterality: N/A;   HIATAL HERNIA REPAIR N/A 09/30/2022   Procedure: HERNIA REPAIR HIATAL;  Surgeon: Lyndel Deward PARAS, MD;  Location: [facility];  Service: General;  Laterality: N/A;   LAPAROSCOPIC INSERTION GASTROSTOMY TUBE N/A 12/02/2022   Procedure: LAPAROSCOPIC REMNANT GASTROSTOMY G TUBE;  Surgeon: Lyndel Deward PARAS, MD;  Location: [facility];  Service: General;  Laterality: N/A;   LAPAROSCOPIC ROUX-EN-Y GASTRIC BYPASS WITH HIATAL HERNIA REPAIR  09/30/2022   LAPAROSCOPY N/A 12/02/2022   Procedure: LAPAROSCOPY DIAGNOSTIC;  Surgeon: Lyndel Deward PARAS, MD;  Location: [facility];  Service: General;  Laterality: N/A;   TONSILLECTOMY     TUBAL LIGATION     UPPER GI ENDOSCOPY N/A 09/30/2022   Procedure: UPPER GI ENDOSCOPY;  Surgeon: Lyndel Deward PARAS, MD;  Location: [facility];  Service: General;  Laterality: N/A;   UPPER GI ENDOSCOPY N/A 12/02/2022   Procedure: UPPER ENDOSCOPY;  Surgeon: Lyndel Deward PARAS, MD;  Location: [facility];  Service: General;  Laterality: N/A;    The following portions of the patient's history were reviewed and updated as appropriate: allergies, current medications, past family history, past medical history, past social history, past surgical history and problem list.   Health Maintenance:   Normal pap and negative HRHPV:  Diagnosis  Date Value Ref Range Status  12/25/2020   Final   - Negative for intraepithelial lesion or malignancy (NILM)  Normal mammogram on 01/22/24.   Review of Systems:  Pertinent items noted in HPI and remainder of comprehensive ROS otherwise negative.  Physical Exam:  BP 126/84   Pulse (!) 103   Ht 5' 2 (1.575 m)   Wt 249 lb (112.9 kg)   BMI 45.54 kg/m  CONSTITUTIONAL: Well-developed, well-nourished female in no acute distress.  HEENT:  Normocephalic, atraumatic. External right and left ear normal. No scleral icterus.  NECK: Normal range of motion, supple, no masses noted on observation SKIN: No rash noted.  Not diaphoretic. No erythema. No pallor. MUSCULOSKELETAL: Normal range of motion. No edema noted. NEUROLOGIC: Alert and oriented to person, place, and time. Normal muscle tone coordination. No cranial nerve deficit noted. PSYCHIATRIC: Normal mood and affect. Normal behavior. Normal judgment and thought content.   PELVIC: Deferred  Labs and Imaging Results for orders placed or performed in visit on 04/27/24 (from the past week)  Protein, urine, 24 hour   Collection Time: 04/27/24  2:35 PM  Result Value Ref Range   Protein, 24H Urine 158 (H) 0 - 149 mg/24 h   CT ABDOMEN PELVIS W CONTRAST Result Date: 04/16/2024 CLINICAL DATA:  Vaginal pain with vaginal discharge back pain and dysuria. EXAM: CT ABDOMEN AND PELVIS WITH CONTRAST TECHNIQUE: Multidetector CT imaging of the abdomen and pelvis was performed using the standard protocol following bolus administration of intravenous contrast. RADIATION DOSE REDUCTION: This exam was performed according to the departmental dose-optimization program which includes automated exposure control, adjustment of the mA and/or kV according to patient size and/or use of iterative reconstruction technique. CONTRAST:  OMNIPAQUE  IOHEXOL  300 MG/ML  SOLN COMPARISON:  December 09, 2023 FINDINGS: Lower chest: No acute abnormality. Hepatobiliary: There is diffuse fatty infiltration of the liver parenchyma. No focal liver abnormality is seen. Status post cholecystectomy. No biliary dilatation. Pancreas: Unremarkable. No pancreatic ductal dilatation or surrounding inflammatory changes. Spleen: Normal in size without focal abnormality. Adrenals/Urinary Tract: Adrenal glands are unremarkable. Kidneys are normal, without renal calculi, focal lesion, or hydronephrosis. The urinary bladder is empty and subsequently limited in evaluation. Stomach/Bowel: Surgical sutures are seen throughout the gastric region. Surgically anastomosed bowel is also seen within the mid left abdomen.  Appendix appears normal. No evidence of bowel wall thickening, distention, or inflammatory changes. Noninflamed diverticula are seen throughout the sigmoid colon. Vascular/Lymphatic: Aortic atherosclerosis. No enlarged abdominal or pelvic lymph nodes. Reproductive: An IUD is in place. Uterus and bilateral adnexa are otherwise unremarkable. Other: No abdominal wall hernia or abnormality. No abdominopelvic ascites. Musculoskeletal: No acute or significant osseous findings. IMPRESSION: 1. Hepatic steatosis. 2. Evidence of prior cholecystectomy. 3. Sigmoid diverticulosis. 4. Aortic atherosclerosis. Electronically Signed   By: Suzen Dials M.D.   On: 04/16/2024 22:48    Assessment and Plan:  1. BV (bacterial vaginosis) (Primary) Self swab done.  Reviewed if positive would treat and then do suppression until surgery. Reviewed plan of IV flagyl  with preop antibiotics already.   2. Acute cystitis without hematuria Symptoms improved after antibiotics.   3. Preop examination Called Manuelita Hoit, PA-C through [facility] regarding pain control. We reviewed plan for TAP block and that I will manage her pain for the first week postop assuming surgery proceeds as planned. She is currently on Percocet 7.5/325, one tablet Q6 hours in addition to non-narcotic pain medication management.  She takes about 3 tablets a day. Reviewed pain management plan with her and she is on board.   Discussed again main risk of surgery is with  initial entry. Reviewed general procedure again and postoperative plan for discharge unless pain not controlled or other complication. All questions answered.    Routine preventative health maintenance measures emphasized. Please refer to After Visit Summary for other counseling recommendations.   No follow-ups on file.  Vina Solian, MD, FACOG Obstetrician & Gynecologist, [facility] for [facility], [facility]

## 2024-04-26 NOTE — H&P (View-Only) (Signed)
 GYNECOLOGY OFFICE VISIT NOTE  History:  Elizabeth [name] is a 48 y.o. [id number] here today for preop visit and ongoing monitoring for her BV. She was also seen in the ED on 6/29 through [facility] and was diagnosed with a UTI. She was prescribed antibiotics and has felt improvement since taking them. She still has some back pain. .   She still has some vaginal discharge.   She has a hysterectomy scheduled for 7/21 due to heavy bleeding.   Past Medical History:  Diagnosis Date   Anemia    Anxiety    Astigmatism    Depression    Diabetes mellitus without complication (HCC)    Dry mouth    Fatigue    GERD (gastroesophageal reflux disease)    Heartburn    Hepatic steatosis    Hepatomegaly    High cholesterol    Hypertension    Keratoconjunctivitis    Liver disease    Muscle pain    Nausea and vomiting 04/21/2023   Ovarian cyst    Positive ANA (antinuclear antibody)    Pulmonary nodules    Stomach ulcer    Vitamin D  deficiency    Weakness     Past Surgical History:  Procedure Laterality Date   BIOPSY  01/18/2021   Procedure: BIOPSY;  Surgeon: San Sandor GAILS, DO;  Location: [facility];  Service: Gastroenterology;;   BIOPSY  10/28/2023   Procedure: BIOPSY;  Surgeon: Lyndel Deward PARAS, MD;  Location: [facility];  Service: General;;   CHOLECYSTECTOMY N/A 05/11/2021   Procedure: LAPAROSCOPIC CHOLECYSTECTOMY WITH INTRAOPERATIVE CHOLANGIOGRAM;  Surgeon: Lyndel Deward PARAS, MD;  Location: [facility];  Service: General;  Laterality: N/A;   DILATION AND CURETTAGE OF UTERUS     ENDOMETRIAL BIOPSY  12/19/2017   normal per pt   ESOPHAGOGASTRODUODENOSCOPY N/A 10/28/2023   Procedure: UPPER ENDOSCOPY;  Surgeon: Lyndel Deward PARAS, MD;  Location: [facility];  Service: General;  Laterality: N/A;   ESOPHAGOGASTRODUODENOSCOPY (EGD) WITH PROPOFOL  N/A 01/18/2021   Procedure: ESOPHAGOGASTRODUODENOSCOPY (EGD) WITH PROPOFOL ;  Surgeon: San Sandor GAILS, DO;  Location: [facility];   Service: Gastroenterology;  Laterality: N/A;   HIATAL HERNIA REPAIR N/A 09/30/2022   Procedure: HERNIA REPAIR HIATAL;  Surgeon: Lyndel Deward PARAS, MD;  Location: [facility];  Service: General;  Laterality: N/A;   LAPAROSCOPIC INSERTION GASTROSTOMY TUBE N/A 12/02/2022   Procedure: LAPAROSCOPIC REMNANT GASTROSTOMY G TUBE;  Surgeon: Lyndel Deward PARAS, MD;  Location: [facility];  Service: General;  Laterality: N/A;   LAPAROSCOPIC ROUX-EN-Y GASTRIC BYPASS WITH HIATAL HERNIA REPAIR  09/30/2022   LAPAROSCOPY N/A 12/02/2022   Procedure: LAPAROSCOPY DIAGNOSTIC;  Surgeon: Lyndel Deward PARAS, MD;  Location: [facility];  Service: General;  Laterality: N/A;   TONSILLECTOMY     TUBAL LIGATION     UPPER GI ENDOSCOPY N/A 09/30/2022   Procedure: UPPER GI ENDOSCOPY;  Surgeon: Lyndel Deward PARAS, MD;  Location: [facility];  Service: General;  Laterality: N/A;   UPPER GI ENDOSCOPY N/A 12/02/2022   Procedure: UPPER ENDOSCOPY;  Surgeon: Lyndel Deward PARAS, MD;  Location: [facility];  Service: General;  Laterality: N/A;    The following portions of the patient's history were reviewed and updated as appropriate: allergies, current medications, past family history, past medical history, past social history, past surgical history and problem list.   Health Maintenance:   Normal pap and negative HRHPV:  Diagnosis  Date Value Ref Range Status  12/25/2020   Final   - Negative for intraepithelial lesion or malignancy (NILM)  Normal mammogram on 01/22/24.   Review of Systems:  Pertinent items noted in HPI and remainder of comprehensive ROS otherwise negative.  Physical Exam:  BP 126/84   Pulse (!) 103   Ht 5' 2 (1.575 m)   Wt 249 lb (112.9 kg)   BMI 45.54 kg/m  CONSTITUTIONAL: Well-developed, well-nourished female in no acute distress.  HEENT:  Normocephalic, atraumatic. External right and left ear normal. No scleral icterus.  NECK: Normal range of motion, supple, no masses noted on observation SKIN: No rash noted.  Not diaphoretic. No erythema. No pallor. MUSCULOSKELETAL: Normal range of motion. No edema noted. NEUROLOGIC: Alert and oriented to person, place, and time. Normal muscle tone coordination. No cranial nerve deficit noted. PSYCHIATRIC: Normal mood and affect. Normal behavior. Normal judgment and thought content.   PELVIC: Deferred  Labs and Imaging Results for orders placed or performed in visit on 04/27/24 (from the past week)  Protein, urine, 24 hour   Collection Time: 04/27/24  2:35 PM  Result Value Ref Range   Protein, 24H Urine 158 (H) 0 - 149 mg/24 h   CT ABDOMEN PELVIS W CONTRAST Result Date: 04/16/2024 CLINICAL DATA:  Vaginal pain with vaginal discharge back pain and dysuria. EXAM: CT ABDOMEN AND PELVIS WITH CONTRAST TECHNIQUE: Multidetector CT imaging of the abdomen and pelvis was performed using the standard protocol following bolus administration of intravenous contrast. RADIATION DOSE REDUCTION: This exam was performed according to the departmental dose-optimization program which includes automated exposure control, adjustment of the mA and/or kV according to patient size and/or use of iterative reconstruction technique. CONTRAST:  OMNIPAQUE  IOHEXOL  300 MG/ML  SOLN COMPARISON:  December 09, 2023 FINDINGS: Lower chest: No acute abnormality. Hepatobiliary: There is diffuse fatty infiltration of the liver parenchyma. No focal liver abnormality is seen. Status post cholecystectomy. No biliary dilatation. Pancreas: Unremarkable. No pancreatic ductal dilatation or surrounding inflammatory changes. Spleen: Normal in size without focal abnormality. Adrenals/Urinary Tract: Adrenal glands are unremarkable. Kidneys are normal, without renal calculi, focal lesion, or hydronephrosis. The urinary bladder is empty and subsequently limited in evaluation. Stomach/Bowel: Surgical sutures are seen throughout the gastric region. Surgically anastomosed bowel is also seen within the mid left abdomen.  Appendix appears normal. No evidence of bowel wall thickening, distention, or inflammatory changes. Noninflamed diverticula are seen throughout the sigmoid colon. Vascular/Lymphatic: Aortic atherosclerosis. No enlarged abdominal or pelvic lymph nodes. Reproductive: An IUD is in place. Uterus and bilateral adnexa are otherwise unremarkable. Other: No abdominal wall hernia or abnormality. No abdominopelvic ascites. Musculoskeletal: No acute or significant osseous findings. IMPRESSION: 1. Hepatic steatosis. 2. Evidence of prior cholecystectomy. 3. Sigmoid diverticulosis. 4. Aortic atherosclerosis. Electronically Signed   By: Suzen Dials M.D.   On: 04/16/2024 22:48    Assessment and Plan:  1. BV (bacterial vaginosis) (Primary) Self swab done.  Reviewed if positive would treat and then do suppression until surgery. Reviewed plan of IV flagyl  with preop antibiotics already.   2. Acute cystitis without hematuria Symptoms improved after antibiotics.   3. Preop examination Called Manuelita Hoit, PA-C through [facility] regarding pain control. We reviewed plan for TAP block and that I will manage her pain for the first week postop assuming surgery proceeds as planned. She is currently on Percocet 7.5/325, one tablet Q6 hours in addition to non-narcotic pain medication management.  She takes about 3 tablets a day. Reviewed pain management plan with her and she is on board.   Discussed again main risk of surgery is with  initial entry. Reviewed general procedure again and postoperative plan for discharge unless pain not controlled or other complication. All questions answered.    Routine preventative health maintenance measures emphasized. Please refer to After Visit Summary for other counseling recommendations.   No follow-ups on file.  Vina Solian, MD, FACOG Obstetrician & Gynecologist, [facility] for [facility], [facility]

## 2024-04-27 ENCOUNTER — Other Ambulatory Visit

## 2024-04-27 DIAGNOSIS — R809 Proteinuria, unspecified: Secondary | ICD-10-CM

## 2024-04-27 NOTE — Progress Notes (Signed)
 Tial vol : start time is 04/25/2024 12:30pm and end time is 04/26/2024 1230pm

## 2024-04-28 ENCOUNTER — Ambulatory Visit: Admitting: Obstetrics and Gynecology

## 2024-04-28 ENCOUNTER — Telehealth: Payer: Self-pay | Admitting: *Deleted

## 2024-04-28 ENCOUNTER — Encounter: Payer: Self-pay | Admitting: Obstetrics and Gynecology

## 2024-04-28 ENCOUNTER — Other Ambulatory Visit (HOSPITAL_COMMUNITY)
Admission: RE | Admit: 2024-04-28 | Discharge: 2024-04-28 | Disposition: A | Discharge status: Home / Self Care | Source: Ambulatory Visit | Attending: Obstetrics and Gynecology | Admitting: Obstetrics and Gynecology

## 2024-04-28 ENCOUNTER — Ambulatory Visit (INDEPENDENT_AMBULATORY_CARE_PROVIDER_SITE_OTHER): Admitting: Sports Medicine

## 2024-04-28 ENCOUNTER — Encounter: Payer: Self-pay | Admitting: Sports Medicine

## 2024-04-28 VITALS — BP 138/88 | Ht 62.0 in | Wt 249.0 lb

## 2024-04-28 VITALS — BP 126/84 | HR 103 | Ht 62.0 in | Wt 249.0 lb

## 2024-04-28 DIAGNOSIS — M7531 Calcific tendinitis of right shoulder: Secondary | ICD-10-CM | POA: Diagnosis present

## 2024-04-28 DIAGNOSIS — N760 Acute vaginitis: Secondary | ICD-10-CM | POA: Insufficient documentation

## 2024-04-28 DIAGNOSIS — B9689 Other specified bacterial agents as the cause of diseases classified elsewhere: Secondary | ICD-10-CM

## 2024-04-28 DIAGNOSIS — Z01818 Encounter for other preprocedural examination: Secondary | ICD-10-CM

## 2024-04-28 DIAGNOSIS — N3000 Acute cystitis without hematuria: Secondary | ICD-10-CM | POA: Diagnosis not present

## 2024-04-28 LAB — PROTEIN, URINE, 24 HOUR: Protein, 24H Urine: 158 mg/(24.h) — ABNORMAL HIGH (ref 0–149)

## 2024-04-28 NOTE — Telephone Encounter (Signed)
 Patient left a message that the scheduled appointment day and time works for her.

## 2024-04-28 NOTE — Telephone Encounter (Signed)
 Left patient a message about scheduled Post Op appointment with Dr. Cleatus.

## 2024-04-28 NOTE — Progress Notes (Signed)
   Subjective:    Patient ID: Elizabeth [name], female    DOB: Mar 21, 1976, 48 y.o.   MRN: [id number]  HPI  Elizabeth [name] presents today for follow-up on right shoulder pain secondary to chronic calcific tendinosis.  She has only had 1 physical therapy visit but found it to be helpful.  She has been doing some home exercises and is scheduled for 2 more visits soon.  She is also scheduled for a hysterectomy on July 21.  Review of Systems As above    Objective:   Physical Exam  Right shoulder: Range of motion is better than on previous exams.  Good rotator cuff strength.      Assessment & Plan:   Right shoulder pain secondary to chronic calcific tendinosis  Patient will continue with physical therapy but may need a brief respite after her hysterectomy.  She understands the importance of continuing with her home exercises as well.  I explained that surgical debridement is still a possibility if she does not improve with physical therapy.  She will follow-up as needed.  This note was dictated using Dragon naturally speaking software and may contain errors in syntax, spelling, or content which have not been identified prior to signing this note.

## 2024-04-29 ENCOUNTER — Ambulatory Visit: Admitting: Rehabilitation

## 2024-04-29 ENCOUNTER — Ambulatory Visit: Payer: Self-pay | Admitting: Family

## 2024-04-29 ENCOUNTER — Ambulatory Visit: Payer: Self-pay | Admitting: Obstetrics and Gynecology

## 2024-04-29 ENCOUNTER — Telehealth: Payer: Self-pay | Admitting: Family

## 2024-04-29 DIAGNOSIS — E1165 Type 2 diabetes mellitus with hyperglycemia: Secondary | ICD-10-CM

## 2024-04-29 DIAGNOSIS — B3731 Acute candidiasis of vulva and vagina: Secondary | ICD-10-CM

## 2024-04-29 LAB — CERVICOVAGINAL ANCILLARY ONLY
Bacterial Vaginitis (gardnerella): NEGATIVE
Candida Glabrata: NEGATIVE
Candida Vaginitis: POSITIVE — AB
Comment: NEGATIVE
Comment: NEGATIVE
Comment: NEGATIVE

## 2024-04-29 MED ORDER — FLUCONAZOLE 150 MG PO TABS
150.0000 mg | ORAL_TABLET | ORAL | 3 refills | Status: DC
Start: 2024-04-29 — End: 2024-05-10

## 2024-04-29 NOTE — Progress Notes (Signed)
 Surgical Instructions   Your procedure is scheduled on Monday May 10, 2024. Report to [facility] Main Entrance A at 8:45 A.M., then check in with the Admitting office. Any questions or running late day of surgery: call [phone]  Questions prior to your surgery date: call [phone], Monday-Friday, 8am-4pm. If you experience any cold or flu symptoms such as cough, fever, chills, shortness of breath, etc. between now and your scheduled surgery, please notify us  at the above number.     Remember:  Do not eat after midnight the night before your surgery   You may drink clear liquids until 7:45 the morning of your surgery.   Clear liquids allowed are: Water , Non-Citrus Juices (without pulp), Carbonated Beverages, Clear Tea (no milk, honey, etc.), Black Coffee Only (NO MILK, CREAM OR POWDERED CREAMER of any kind), and Gatorade.  Patient Instructions  The night before surgery:  No food after midnight. ONLY clear liquids after midnight  The day of surgery (if you have diabetes): Drink ONE (1) 12 oz G2 given to you in your pre admission testing appointment by 7:45 the morning of surgery. Drink in one sitting. Do not sip.  This drink was given to you during your hospital  pre-op appointment visit.  Nothing else to drink after completing the  12 oz bottle of G2.         If you have questions, please contact your surgeon's office.     Take these medicines the morning of surgery with A SIP OF WATER   escitalopram  (LEXAPRO )  esomeprazole  (NEXIUM )  famotidine  (PEPCID )  gabapentin  (NEURONTIN )    May take these medicines IF NEEDED: acetaminophen  (TYLENOL )  hydrOXYzine  (VISTARIL )  methocarbamol  (ROBAXIN )  ondansetron  (ZOFRAN -ODT)  oxyCODONE -acetaminophen  (PERCOCET)   One week prior to surgery, STOP taking any Aspirin  (unless otherwise instructed by your surgeon) Aleve, Naproxen, Ibuprofen, Motrin, Advil, Goody's, BC's, all herbal medications, fish oil, and non-prescription  vitamins.  WHAT DO I DO ABOUT MY DIABETES MEDICATION?   Do not take oral diabetes medicines (pills) the morning of surgery.        DO NOT TAKE YOUR dapagliflozin  propanediol (FARXIGA ) 72 HOURS PRIOR TO SURGERY WITH THE LAST DOSE BEING 05/06/2024.          DO NOT TAKE YOUR  metFORMIN  (GLUCOPHAGE -XR) THE MORNING OF SURGERY.          DO NOT TAKE YOUR Dulaglutide  (TRULICITY ) 7 DAYS PRIOR TO SURGERY WITH THE LAST DOSE BEING NO LATER THAN 05/02/2024.    THE MORNING OF SURGERY TAKE ZERO UNITS OF YOUR insulin  lispro (HUMALOG  KWIKPEN). If your CBG is greater than 220 mg/dL, you may take  of your sliding scale (correction) dose of insulin .  The day of surgery, do not take other diabetes injectables, including Byetta (exenatide), Bydureon (exenatide ER), Victoza (liraglutide), or Trulicity  (dulaglutide ).   HOW TO MANAGE YOUR DIABETES BEFORE AND AFTER SURGERY  Why is it important to control my blood sugar before and after surgery? Improving blood sugar levels before and after surgery helps healing and can limit problems. A way of improving blood sugar control is eating a healthy diet by:  Eating less sugar and carbohydrates  Increasing activity/exercise  Talking with your doctor about reaching your blood sugar goals High blood sugars (greater than 180 mg/dL) can raise your risk of infections and slow your recovery, so you will need to focus on controlling your diabetes during the weeks before surgery. Make sure that the doctor who takes care of your diabetes knows about your  planned surgery including the date and location.  How do I manage my blood sugar before surgery? Check your blood sugar at least 4 times a day, starting 2 days before surgery, to make sure that the level is not too high or low.  Check your blood sugar the morning of your surgery when you wake up and every 2 hours until you get to the Short Stay unit.  If your blood sugar is less than 70 mg/dL, you will need to treat for low  blood sugar: Do not take insulin . Treat a low blood sugar (less than 70 mg/dL) with  cup of clear juice (cranberry or apple), 4 glucose tablets, OR glucose gel. Recheck blood sugar in 15 minutes after treatment (to make sure it is greater than 70 mg/dL). If your blood sugar is not greater than 70 mg/dL on recheck, call [phone] for further instructions. Report your blood sugar to the short stay nurse when you get to Short Stay.  If you are admitted to the hospital after surgery: Your blood sugar will be checked by the staff and you will probably be given insulin  after surgery (instead of oral diabetes medicines) to make sure you have good blood sugar levels. The goal for blood sugar control after surgery is 80-180 mg/dL.                      Do NOT Smoke (Tobacco/Vaping) for 24 hours prior to your procedure.  If you use a CPAP at night, you may bring your mask/headgear for your overnight stay.   You will be asked to remove any contacts, glasses, piercing's, hearing aid's, dentures/partials prior to surgery. Please bring cases for these items if needed.    Patients discharged the day of surgery will not be allowed to drive home, and someone needs to stay with them for 24 hours.  SURGICAL WAITING ROOM VISITATION Patients may have no more than 2 support people in the waiting area - these visitors may rotate.   Pre-op nurse will coordinate an appropriate time for 1 ADULT support person, who may not rotate, to accompany patient in pre-op.  Children under the age of 63 must have an adult with them who is not the patient and must remain in the main waiting area with an adult.  If the patient needs to stay at the hospital during part of their recovery, the visitor guidelines for inpatient rooms apply.  Please refer to the [facility] website for the visitor guidelines for any additional information.   If you received a COVID test during your pre-op visit  it is requested that you wear a  mask when out in public, stay away from anyone that may not be feeling well and notify your surgeon if you develop symptoms. If you have been in contact with anyone that has tested positive in the last 10 days please notify you surgeon.      Pre-operative CHG Bathing Instructions   You can play a key role in reducing the risk of infection after surgery. Your skin needs to be as free of germs as possible. You can reduce the number of germs on your skin by washing with CHG (chlorhexidine  gluconate) soap before surgery. CHG is an antiseptic soap that kills germs and continues to kill germs even after washing.   DO NOT use if you have an allergy to chlorhexidine /CHG or antibacterial soaps. If your skin becomes reddened or irritated, stop using the CHG and notify one of  our RNs at [phone].              TAKE A SHOWER THE NIGHT BEFORE SURGERY AND THE DAY OF SURGERY    Please keep in mind the following:  DO NOT shave, including legs and underarms, 48 hours prior to surgery.   Place clean sheets on your bed the night before surgery Use a clean washcloth (not used since being washed) for each shower. DO NOT sleep with pet's night before surgery.  CHG Shower Instructions:  Wash your face and private area with normal soap. If you choose to wash your hair, wash first with your normal shampoo.  After you use shampoo/soap, rinse your hair and body thoroughly to remove shampoo/soap residue.  Turn the water  OFF and apply half the bottle of CHG soap to a CLEAN washcloth.  Apply CHG soap ONLY FROM YOUR NECK DOWN TO YOUR TOES (washing for 3-5 minutes)  DO NOT use CHG soap on face, private areas, open wounds, or sores.  Pay special attention to the area where your surgery is being performed.  If you are having back surgery, having someone wash your back for you may be helpful. Wait 2 minutes after CHG soap is applied, then you may rinse off the CHG soap.  Pat dry with a clean towel  Put on clean pajamas     Additional instructions for the day of surgery: DO NOT APPLY any lotions, deodorants or perfumes.   Do not wear jewelry or makeup Do not wear nail polish, gel polish, artificial nails, or any other type of covering on natural nails (fingers and toes) Do not bring valuables to the hospital. [facility] is not responsible for valuables/personal belongings. Put on clean/comfortable clothes.  Please brush your teeth.  Ask your nurse before applying any prescription medications to the skin.

## 2024-04-29 NOTE — Telephone Encounter (Signed)
 Noted. Already on farxiga . Will continue to watch.

## 2024-04-29 NOTE — Telephone Encounter (Signed)
-----   Message from Harlene Horton sent at 04/28/2024  5:47 PM EDT ----- Rosine can either watch or consider adding Jardiance 10 mg since A1C is up some. ----- Message ----- From: Daryl Setter, NP Sent: 04/28/2024   8:12 AM EDT To: Harlene DELENA Horton, MD  Hi,  So her 24 hr urine for protein is a bit elevated, but not in nephrotic range. She is diabetic.  She is on an ACE.  Would you just watch this or would you refer to nephrology?   Thanks,  Jasyn Mey

## 2024-04-30 ENCOUNTER — Encounter (HOSPITAL_COMMUNITY): Payer: Self-pay

## 2024-04-30 ENCOUNTER — Encounter (HOSPITAL_COMMUNITY)
Admission: RE | Admit: 2024-04-30 | Discharge: 2024-04-30 | Disposition: A | Discharge status: Home / Self Care | Source: Ambulatory Visit | Attending: Obstetrics and Gynecology

## 2024-04-30 ENCOUNTER — Other Ambulatory Visit: Payer: Self-pay

## 2024-04-30 VITALS — BP 121/88 | HR 86 | Temp 98.4°F | Resp 18 | Ht 62.0 in | Wt 250.8 lb

## 2024-04-30 DIAGNOSIS — E1165 Type 2 diabetes mellitus with hyperglycemia: Secondary | ICD-10-CM | POA: Diagnosis not present

## 2024-04-30 DIAGNOSIS — Z01818 Encounter for other preprocedural examination: Secondary | ICD-10-CM

## 2024-04-30 DIAGNOSIS — Z01812 Encounter for preprocedural laboratory examination: Secondary | ICD-10-CM | POA: Diagnosis present

## 2024-04-30 DIAGNOSIS — N924 Excessive bleeding in the premenopausal period: Secondary | ICD-10-CM | POA: Diagnosis not present

## 2024-04-30 HISTORY — DX: Unspecified osteoarthritis, unspecified site: M19.90

## 2024-04-30 LAB — SURGICAL PCR SCREEN
MRSA, PCR: NEGATIVE
Staphylococcus aureus: POSITIVE — AB

## 2024-04-30 LAB — COMPREHENSIVE METABOLIC PANEL WITH GFR
ALT: 19 U/L (ref 0–44)
AST: 20 U/L (ref 15–41)
Albumin: 3.3 g/dL — ABNORMAL LOW (ref 3.5–5.0)
Alkaline Phosphatase: 49 U/L (ref 38–126)
Anion gap: 10 (ref 5–15)
BUN: 12 mg/dL (ref 6–20)
CO2: 24 mmol/L (ref 22–32)
Calcium: 8.9 mg/dL (ref 8.9–10.3)
Chloride: 105 mmol/L (ref 98–111)
Creatinine, Ser: 0.73 mg/dL (ref 0.44–1.00)
GFR, Estimated: >60 mL/min (ref >60)
Glucose, Bld: 106 mg/dL — ABNORMAL HIGH (ref 70–99)
Potassium: 3.8 mmol/L (ref 3.5–5.1)
Sodium: 139 mmol/L (ref 135–145)
Total Bilirubin: 0.4 mg/dL (ref 0.0–1.2)
Total Protein: 6.6 g/dL (ref 6.5–8.1)

## 2024-04-30 LAB — CBC
HCT: 35.9 % — ABNORMAL LOW (ref 36.0–46.0)
Hemoglobin: 11.1 g/dL — ABNORMAL LOW (ref 12.0–15.0)
MCH: 23.8 pg — ABNORMAL LOW (ref 26.0–34.0)
MCHC: 30.9 g/dL (ref 30.0–36.0)
MCV: 77 fL — ABNORMAL LOW (ref 80.0–100.0)
Platelets: 239 K/uL (ref 150–400)
RBC: 4.66 MIL/uL (ref 3.87–5.11)
RDW: 14.5 % (ref 11.5–15.5)
WBC: 6 K/uL (ref 4.0–10.5)
nRBC: 0 % (ref 0.0–0.2)

## 2024-04-30 LAB — TYPE AND SCREEN
ABO/RH(D): B POS
Antibody Screen: NEGATIVE

## 2024-04-30 LAB — GLUCOSE, CAPILLARY: Glucose-Capillary: 118 mg/dL — ABNORMAL HIGH (ref 70–99)

## 2024-04-30 NOTE — Progress Notes (Addendum)
 PCP - Daryl Setter, NP  Cardiologist - no longer follow with cardiology Endocrinology -Dartha Ernst, MD    PPM/ICD - denies Device Orders - n/a Rep Notified - n/a  Chest x-ray - denies EKG - 12-09-23 Stress Test - 04-24-19 ECHO - denies Cardiac Cath - denies  Sleep Study - denies CPAP - n/a  Fasting Blood Sugar -  Per patient blood sugar ranges between 113-122. Last A1c on 02-20-24 7.6.  Blood sugar at PAT appointment 118. Checks Blood Sugar BID  Last dose of GLP1 agonist-  Dulaglutide  (TRULICITY )  GLP1 instructions: Patient aware last dose will be on 05-02-24. Patient voiced understanding  Blood Thinner Instructions:denies Aspirin  Instructions:n/a  ERAS Protcol - PRE-SURGERY Ensure or G2-   COVID TEST- n/a   Anesthesia review: no  Patient denies shortness of breath, fever, cough and chest pain at PAT appointment   All instructions explained to the patient, with a verbal understanding of the material. Patient agrees to go over the instructions while at home for a better understanding. Patient also instructed to self quarantine after being tested for COVID-19. The opportunity to ask questions was provided.

## 2024-05-02 ENCOUNTER — Encounter: Payer: Self-pay | Admitting: Obstetrics and Gynecology

## 2024-05-02 ENCOUNTER — Encounter: Payer: Self-pay | Admitting: Family

## 2024-05-05 ENCOUNTER — Encounter: Admitting: Rehabilitation

## 2024-05-10 ENCOUNTER — Ambulatory Visit (HOSPITAL_COMMUNITY)
Admission: RE | Admit: 2024-05-10 | Discharge: 2024-05-10 | Disposition: A | Discharge status: Home / Self Care | Attending: Obstetrics and Gynecology | Admitting: Obstetrics and Gynecology

## 2024-05-10 ENCOUNTER — Encounter (HOSPITAL_COMMUNITY)
Admission: RE | Disposition: A | Discharge status: Home / Self Care | Payer: Self-pay | Source: Home / Self Care | Attending: Obstetrics and Gynecology

## 2024-05-10 ENCOUNTER — Other Ambulatory Visit: Payer: Self-pay

## 2024-05-10 ENCOUNTER — Other Ambulatory Visit: Payer: Self-pay | Admitting: Family

## 2024-05-10 ENCOUNTER — Ambulatory Visit (HOSPITAL_COMMUNITY): Payer: Self-pay | Admitting: Anesthesiology

## 2024-05-10 DIAGNOSIS — E119 Type 2 diabetes mellitus without complications: Secondary | ICD-10-CM | POA: Insufficient documentation

## 2024-05-10 DIAGNOSIS — K219 Gastro-esophageal reflux disease without esophagitis: Secondary | ICD-10-CM | POA: Insufficient documentation

## 2024-05-10 DIAGNOSIS — Z794 Long term (current) use of insulin: Secondary | ICD-10-CM | POA: Insufficient documentation

## 2024-05-10 DIAGNOSIS — M1990 Unspecified osteoarthritis, unspecified site: Secondary | ICD-10-CM | POA: Insufficient documentation

## 2024-05-10 DIAGNOSIS — K5730 Diverticulosis of large intestine without perforation or abscess without bleeding: Secondary | ICD-10-CM | POA: Diagnosis not present

## 2024-05-10 DIAGNOSIS — F419 Anxiety disorder, unspecified: Secondary | ICD-10-CM | POA: Insufficient documentation

## 2024-05-10 DIAGNOSIS — F32A Depression, unspecified: Secondary | ICD-10-CM | POA: Insufficient documentation

## 2024-05-10 DIAGNOSIS — N72 Inflammatory disease of cervix uteri: Secondary | ICD-10-CM | POA: Insufficient documentation

## 2024-05-10 DIAGNOSIS — N924 Excessive bleeding in the premenopausal period: Secondary | ICD-10-CM | POA: Diagnosis present

## 2024-05-10 DIAGNOSIS — I700 Atherosclerosis of aorta: Secondary | ICD-10-CM | POA: Diagnosis not present

## 2024-05-10 DIAGNOSIS — T8384XA Pain from genitourinary prosthetic devices, implants and grafts, initial encounter: Secondary | ICD-10-CM | POA: Diagnosis not present

## 2024-05-10 DIAGNOSIS — I10 Essential (primary) hypertension: Secondary | ICD-10-CM | POA: Diagnosis not present

## 2024-05-10 DIAGNOSIS — K279 Peptic ulcer, site unspecified, unspecified as acute or chronic, without hemorrhage or perforation: Secondary | ICD-10-CM | POA: Insufficient documentation

## 2024-05-10 DIAGNOSIS — D649 Anemia, unspecified: Secondary | ICD-10-CM | POA: Diagnosis not present

## 2024-05-10 DIAGNOSIS — E1165 Type 2 diabetes mellitus with hyperglycemia: Secondary | ICD-10-CM

## 2024-05-10 DIAGNOSIS — Z87891 Personal history of nicotine dependence: Secondary | ICD-10-CM | POA: Diagnosis not present

## 2024-05-10 DIAGNOSIS — N921 Excessive and frequent menstruation with irregular cycle: Secondary | ICD-10-CM | POA: Insufficient documentation

## 2024-05-10 DIAGNOSIS — D259 Leiomyoma of uterus, unspecified: Secondary | ICD-10-CM | POA: Diagnosis not present

## 2024-05-10 DIAGNOSIS — T8384XD Pain from genitourinary prosthetic devices, implants and grafts, subsequent encounter: Secondary | ICD-10-CM | POA: Diagnosis present

## 2024-05-10 HISTORY — PX: CYSTOSCOPY: SHX5120

## 2024-05-10 HISTORY — PX: TOTAL LAPAROSCOPIC HYSTERECTOMY WITH SALPINGECTOMY: SHX6742

## 2024-05-10 LAB — GLUCOSE, CAPILLARY
Glucose-Capillary: 96 mg/dL (ref 70–99)
Glucose-Capillary: 77 mg/dL (ref 70–99)
Glucose-Capillary: 179 mg/dL — ABNORMAL HIGH (ref 70–99)

## 2024-05-10 LAB — POCT PREGNANCY, URINE: Preg Test, Ur: NEGATIVE

## 2024-05-10 SURGERY — HYSTERECTOMY, TOTAL, LAPAROSCOPIC, WITH SALPINGECTOMY
Anesthesia: General | Site: Bladder

## 2024-05-10 MED ORDER — METRONIDAZOLE 500 MG/100ML IV SOLN
500.0000 mg | INTRAVENOUS | Status: AC
  Administered 2024-05-10: 500 mg via INTRAVENOUS
  Filled 2024-05-10: qty 100

## 2024-05-10 MED ORDER — 0.9 % SODIUM CHLORIDE (POUR BTL) OPTIME
TOPICAL | Status: DC | PRN
  Administered 2024-05-10: 1000 mL

## 2024-05-10 MED ORDER — ROCURONIUM BROMIDE 10 MG/ML (PF) SYRINGE
PREFILLED_SYRINGE | INTRAVENOUS | Status: DC | PRN
  Administered 2024-05-10: 20 mg via INTRAVENOUS
  Administered 2024-05-10: 10 mg via INTRAVENOUS
  Administered 2024-05-10: 70 mg via INTRAVENOUS

## 2024-05-10 MED ORDER — SODIUM CHLORIDE (PF) 0.9 % IJ SOLN
INTRAMUSCULAR | Status: DC | PRN
  Administered 2024-05-10: 40 mL via SURGICAL_CAVITY

## 2024-05-10 MED ORDER — FENTANYL CITRATE (PF) 100 MCG/2ML IJ SOLN
INTRAMUSCULAR | Status: AC
  Filled 2024-05-10: qty 2

## 2024-05-10 MED ORDER — LIDOCAINE 2% (20 MG/ML) 5 ML SYRINGE
INTRAMUSCULAR | Status: DC | PRN
  Administered 2024-05-10: 60 mg via INTRAVENOUS

## 2024-05-10 MED ORDER — FENTANYL CITRATE (PF) 100 MCG/2ML IJ SOLN
25.0000 ug | INTRAMUSCULAR | Status: DC | PRN
  Administered 2024-05-10: 25 ug via INTRAVENOUS
  Administered 2024-05-10: 50 ug via INTRAVENOUS
  Administered 2024-05-10: 25 ug via INTRAVENOUS

## 2024-05-10 MED ORDER — OXYCODONE HCL 5 MG PO TABS
ORAL_TABLET | ORAL | Status: AC
  Filled 2024-05-10: qty 1

## 2024-05-10 MED ORDER — ROCURONIUM BROMIDE 10 MG/ML (PF) SYRINGE
PREFILLED_SYRINGE | INTRAVENOUS | Status: AC
  Filled 2024-05-10: qty 10

## 2024-05-10 MED ORDER — OXYCODONE-ACETAMINOPHEN 10-325 MG PO TABS: 1.0000 | ORAL_TABLET | ORAL | 0 refills | Status: DC | PRN

## 2024-05-10 MED ORDER — PHENYLEPHRINE 80 MCG/ML (10ML) SYRINGE FOR IV PUSH (FOR BLOOD PRESSURE SUPPORT)
PREFILLED_SYRINGE | INTRAVENOUS | Status: DC | PRN
  Administered 2024-05-10: 160 ug via INTRAVENOUS
  Administered 2024-05-10: 80 ug via INTRAVENOUS

## 2024-05-10 MED ORDER — POVIDONE-IODINE 10 % EX SWAB: 2.0000 | Freq: Once | CUTANEOUS | Status: DC

## 2024-05-10 MED ORDER — CEFAZOLIN SODIUM-DEXTROSE 2-4 GM/100ML-% IV SOLN
2.0000 g | INTRAVENOUS | Status: AC
Start: 2024-05-10 — End: 2024-05-10
  Administered 2024-05-10: 2 g via INTRAVENOUS
  Filled 2024-05-10: qty 100

## 2024-05-10 MED ORDER — FENTANYL CITRATE (PF) 250 MCG/5ML IJ SOLN
INTRAMUSCULAR | Status: AC
  Filled 2024-05-10: qty 5

## 2024-05-10 MED ORDER — ONDANSETRON HCL 4 MG/2ML IJ SOLN
INTRAMUSCULAR | Status: DC | PRN
  Administered 2024-05-10: 4 mg via INTRAVENOUS

## 2024-05-10 MED ORDER — DEXAMETHASONE SODIUM PHOSPHATE 10 MG/ML IJ SOLN
INTRAMUSCULAR | Status: AC
  Filled 2024-05-10: qty 1

## 2024-05-10 MED ORDER — MEPERIDINE HCL 25 MG/ML IJ SOLN: 6.2500 mg | INTRAMUSCULAR | Status: DC | PRN

## 2024-05-10 MED ORDER — MIDAZOLAM HCL 2 MG/2ML IJ SOLN
INTRAMUSCULAR | Status: DC | PRN
  Administered 2024-05-10: 2 mg via INTRAVENOUS

## 2024-05-10 MED ORDER — SODIUM CHLORIDE 0.9 % IR SOLN
Status: DC | PRN
  Administered 2024-05-10: 1000 mL

## 2024-05-10 MED ORDER — SOD CITRATE-CITRIC ACID 500-334 MG/5ML PO SOLN
30.0000 mL | ORAL | Status: AC
  Administered 2024-05-10: 30 mL via ORAL
  Filled 2024-05-10: qty 30

## 2024-05-10 MED ORDER — CHLORHEXIDINE GLUCONATE 0.12 % MT SOLN
15.0000 mL | Freq: Once | OROMUCOSAL | Status: AC
  Administered 2024-05-10: 15 mL via OROMUCOSAL
  Filled 2024-05-10: qty 15

## 2024-05-10 MED ORDER — SUGAMMADEX SODIUM 200 MG/2ML IV SOLN
INTRAVENOUS | Status: DC | PRN
  Administered 2024-05-10: 200 mg via INTRAVENOUS

## 2024-05-10 MED ORDER — DEXAMETHASONE SODIUM PHOSPHATE 10 MG/ML IJ SOLN
INTRAMUSCULAR | Status: DC | PRN
  Administered 2024-05-10: 10 mg via INTRAVENOUS

## 2024-05-10 MED ORDER — OXYCODONE HCL 5 MG PO TABS
5.0000 mg | ORAL_TABLET | Freq: Once | ORAL | Status: AC | PRN
  Administered 2024-05-10: 5 mg via ORAL

## 2024-05-10 MED ORDER — LIDOCAINE 2% (20 MG/ML) 5 ML SYRINGE
INTRAMUSCULAR | Status: AC
  Filled 2024-05-10: qty 5

## 2024-05-10 MED ORDER — OXYCODONE HCL 5 MG/5ML PO SOLN: 5.0000 mg | Freq: Once | ORAL | Status: AC | PRN

## 2024-05-10 MED ORDER — ORAL CARE MOUTH RINSE: 15.0000 mL | Freq: Once | OROMUCOSAL | Status: AC

## 2024-05-10 MED ORDER — BUPIVACAINE HCL (PF) 0.5 % IJ SOLN
INTRAMUSCULAR | Status: AC
  Filled 2024-05-10: qty 30

## 2024-05-10 MED ORDER — SUGAMMADEX SODIUM 200 MG/2ML IV SOLN
INTRAVENOUS | Status: AC
  Filled 2024-05-10: qty 2

## 2024-05-10 MED ORDER — LACTATED RINGERS IV SOLN: INTRAVENOUS | Status: DC

## 2024-05-10 MED ORDER — BUPIVACAINE HCL (PF) 0.25 % IJ SOLN
INTRAMUSCULAR | Status: AC
  Filled 2024-05-10: qty 30

## 2024-05-10 MED ORDER — SODIUM CHLORIDE (PF) 0.9 % IJ SOLN
INTRAMUSCULAR | Status: AC
  Filled 2024-05-10: qty 20

## 2024-05-10 MED ORDER — ACETAMINOPHEN 500 MG PO TABS
1000.0000 mg | ORAL_TABLET | ORAL | Status: DC
  Filled 2024-05-10: qty 2

## 2024-05-10 MED ORDER — BUPIVACAINE LIPOSOME 1.3 % IJ SUSP
INTRAMUSCULAR | Status: AC
  Filled 2024-05-10: qty 20

## 2024-05-10 MED ORDER — ACETAMINOPHEN 500 MG PO TABS
1000.0000 mg | ORAL_TABLET | Freq: Once | ORAL | Status: AC
  Administered 2024-05-10: 1000 mg via ORAL
  Filled 2024-05-10: qty 2

## 2024-05-10 MED ORDER — MIDAZOLAM HCL 2 MG/2ML IJ SOLN
INTRAMUSCULAR | Status: AC
  Filled 2024-05-10: qty 2

## 2024-05-10 MED ORDER — ONDANSETRON HCL 4 MG/2ML IJ SOLN
INTRAMUSCULAR | Status: AC
  Filled 2024-05-10: qty 2

## 2024-05-10 MED ORDER — CELECOXIB 200 MG PO CAPS
200.0000 mg | ORAL_CAPSULE | Freq: Once | ORAL | Status: AC
Start: 2024-05-10 — End: 2024-05-10
  Administered 2024-05-10: 200 mg via ORAL
  Filled 2024-05-10: qty 1

## 2024-05-10 MED ORDER — PHENYLEPHRINE 80 MCG/ML (10ML) SYRINGE FOR IV PUSH (FOR BLOOD PRESSURE SUPPORT)
PREFILLED_SYRINGE | INTRAVENOUS | Status: AC
  Filled 2024-05-10: qty 10

## 2024-05-10 MED ORDER — PROPOFOL 10 MG/ML IV BOLUS
INTRAVENOUS | Status: AC
  Filled 2024-05-10: qty 20

## 2024-05-10 MED ORDER — FENTANYL CITRATE (PF) 250 MCG/5ML IJ SOLN
INTRAMUSCULAR | Status: DC | PRN
  Administered 2024-05-10: 100 ug via INTRAVENOUS
  Administered 2024-05-10 (×3): 50 ug via INTRAVENOUS

## 2024-05-10 MED ORDER — PROPOFOL 10 MG/ML IV BOLUS
INTRAVENOUS | Status: DC | PRN
  Administered 2024-05-10: 200 mg via INTRAVENOUS

## 2024-05-10 MED ORDER — INSULIN ASPART 100 UNIT/ML IJ SOLN: 0.0000 [IU] | INTRAMUSCULAR | Status: DC | PRN

## 2024-05-10 MED ORDER — ONDANSETRON HCL 4 MG/2ML IJ SOLN: 4.0000 mg | Freq: Once | INTRAMUSCULAR | Status: DC | PRN

## 2024-05-10 SURGICAL SUPPLY — 42 items
APPLICATOR ARISTA FLEXITIP XL (MISCELLANEOUS) IMPLANT
CHLORAPREP W/TINT 26 (MISCELLANEOUS) ×2 IMPLANT
COVER MAYO STAND STRL (DRAPES) ×4 IMPLANT
DEFOGGER SCOPE WARM SEASHARP (MISCELLANEOUS) ×2 IMPLANT
DERMABOND ADVANCED .7 DNX12 (GAUZE/BANDAGES/DRESSINGS) ×2 IMPLANT
DRAPE SURG IRRIG POUCH 19X23 (DRAPES) ×2 IMPLANT
GLOVE BIO SURGEON STRL SZ 6 (GLOVE) ×4 IMPLANT
GLOVE BIOGEL PI IND STRL 7.0 (GLOVE) ×4 IMPLANT
GOWN STRL REUS W/ TWL LRG LVL3 (GOWN DISPOSABLE) ×6 IMPLANT
HEMOSTAT ARISTA ABSORB 3G PWDR (HEMOSTASIS) IMPLANT
HIBICLENS CHG 4% 4OZ BTL (MISCELLANEOUS) ×4 IMPLANT
IRRIGATION SUCT STRKRFLW 2 WTP (MISCELLANEOUS) ×2 IMPLANT
KIT PINK PAD W/HEAD ARM REST (MISCELLANEOUS) ×2 IMPLANT
KIT TURNOVER KIT B (KITS) ×2 IMPLANT
LHOOK LAP DISP 36CM (ELECTROSURGICAL) ×2 IMPLANT
MANIPULATOR ADVINCU DEL 2.5 PL (MISCELLANEOUS) IMPLANT
MANIPULATOR ADVINCU DEL 3.0 PL (MISCELLANEOUS) IMPLANT
MANIPULATOR ADVINCU DEL 3.5 PL (MISCELLANEOUS) IMPLANT
MANIPULATOR ADVINCU DEL 4.0 PL (MISCELLANEOUS) IMPLANT
NDL INSUFFLATION 14GA 120MM (NEEDLE) ×2 IMPLANT
NEEDLE INSUFFLATION 14GA 120MM (NEEDLE) ×2 IMPLANT
NS IRRIG 1000ML POUR BTL (IV SOLUTION) ×2 IMPLANT
PACK LAPAROSCOPY BASIN (CUSTOM PROCEDURE TRAY) ×2 IMPLANT
PAD OB MATERNITY 11 LF (PERSONAL CARE ITEMS) ×2 IMPLANT
PENCIL SMOKE EVACUATOR (MISCELLANEOUS) ×2 IMPLANT
POUCH LAPAROSCOPIC INSTRUMENT (MISCELLANEOUS) ×2 IMPLANT
SEALER TISSUE G2 CVD JAW 35 (ENDOMECHANICALS) IMPLANT
SET CYSTO W/LG BORE CLAMP LF (SET/KITS/TRAYS/PACK) ×2 IMPLANT
SET TUBE SMOKE EVAC HIGH FLOW (TUBING) ×2 IMPLANT
SPIKE FLUID TRANSFER (MISCELLANEOUS) ×2 IMPLANT
SUT VIC AB 4-0 PS2 18 (SUTURE) ×2 IMPLANT
SUT VICRYL 0 UR6 27IN ABS (SUTURE) IMPLANT
SUT VLOC 180 0 9IN GS21 (SUTURE) ×2 IMPLANT
SYR 10ML LL (SYRINGE) ×2 IMPLANT
SYR 50ML LL SCALE MARK (SYRINGE) ×2 IMPLANT
TOWEL GREEN STERILE FF (TOWEL DISPOSABLE) ×4 IMPLANT
TRAY FOLEY W/BAG SLVR 14FR (SET/KITS/TRAYS/PACK) ×2 IMPLANT
TROCAR ADV FIXATION 5X100MM (TROCAR) ×4 IMPLANT
TROCAR BALLN 12MMX100 BLUNT (TROCAR) IMPLANT
TROCAR KII 8X100ML NONTHREADED (TROCAR) ×2 IMPLANT
TROCAR XCEL NON-BLD 5MMX100MML (ENDOMECHANICALS) ×2 IMPLANT
UNDERPAD 30X36 HEAVY ABSORB (UNDERPADS AND DIAPERS) ×2 IMPLANT

## 2024-05-10 NOTE — Progress Notes (Signed)
 Checked in on Elizabeth [name] after her surgery. She was doing well and had met most goals already. She reported minimal pain following her surgery.   Reviewed no complications and surgery went well. Will call with pathology and to check in on her.   Vina Solian, MD Attending Obstetrician & Gynecologist, [facility] for [facility], [facility]

## 2024-05-10 NOTE — Transfer of Care (Signed)
 Immediate Anesthesia Transfer of Care Note  Patient: Elizabeth [name]  Procedure(s) Performed: HYSTERECTOMY, TOTAL, LAPAROSCOPIC, WITH SALPINGECTOMY (Bilateral: Abdomen) CYSTOSCOPY (Bladder)  Patient Location: PACU  Anesthesia Type:General  Level of Consciousness: awake and alert   Airway & Oxygen Therapy: Patient Spontanous Breathing and Patient connected to face mask  Post-op Assessment: Report given to RN and Post -op Vital signs reviewed and stable  Post vital signs: Reviewed and stable  Last Vitals:  Vitals Value Taken Time  BP 155/100 05/10/24 13:53  Temp 98.29f   Pulse 107 05/10/24 13:55  Resp 25 05/10/24 13:55  SpO2 100 % 05/10/24 13:55  Vitals shown include unfiled device data.  Last Pain:  Vitals:   05/10/24 0955  TempSrc:   PainSc: 0-No pain         Complications: No notable events documented.

## 2024-05-10 NOTE — Anesthesia Preprocedure Evaluation (Addendum)
 Anesthesia Evaluation  Patient identified by MRN, date of birth, ID band Patient awake    Reviewed: Allergy & Precautions, NPO status , Patient's Chart, lab work & pertinent test results  Airway Mallampati: II  TM Distance: >3 FB Neck ROM: Full    Dental no notable dental hx.    Pulmonary neg pulmonary ROS, former smoker   Pulmonary exam normal        Cardiovascular hypertension, negative cardio ROS  Rhythm:Regular Rate:Normal     Neuro/Psych   Anxiety Depression    negative neurological ROS     GI/Hepatic Neg liver ROS, PUD,GERD  ,,  Endo/Other  diabetes, Type 2, Insulin  Dependent    Renal/GU negative Renal ROS  Female GU complaint     Musculoskeletal  (+) Arthritis , Osteoarthritis,    Abdominal Normal abdominal exam  (+)   Peds  Hematology  (+) Blood dyscrasia, anemia Lab Results      Component                Value               Date                      WBC                      6.0                 04/30/2024                HGB                      11.1 (L)            04/30/2024                HCT                      35.9 (L)            04/30/2024                MCV                      77.0 (L)            04/30/2024                PLT                      239                 04/30/2024              Anesthesia Other Findings   Reproductive/Obstetrics                              Anesthesia Physical Anesthesia Plan  ASA: 2  Anesthesia Plan: General   Post-op Pain Management:    Induction: Intravenous  PONV Risk Score and Plan: 3 and Ondansetron , Dexamethasone , Midazolam  and Treatment may vary due to age or medical condition  Airway Management Planned: Mask and Oral ETT  Additional Equipment: None  Intra-op Plan:   Post-operative Plan: Extubation in OR  Informed Consent: I have reviewed the patients History and Physical, chart, labs and discussed the procedure  including the risks, benefits and alternatives for  the proposed anesthesia with the patient or authorized representative who has indicated his/her understanding and acceptance.     Dental advisory given  Plan Discussed with: CRNA  Anesthesia Plan Comments:         Anesthesia Quick Evaluation

## 2024-05-10 NOTE — Anesthesia Procedure Notes (Signed)
 Procedure Name: Intubation Date/Time: 05/10/2024 11:35 AM  Performed by: Vincient Vanaman C, CRNAPre-anesthesia Checklist: Patient identified, Emergency Drugs available, Suction available and Patient being monitored Patient Re-evaluated:Patient Re-evaluated prior to induction Oxygen Delivery Method: Circle System Utilized Preoxygenation: Pre-oxygenation with 100% oxygen Induction Type: IV induction Ventilation: Mask ventilation without difficulty and Oral airway inserted - appropriate to patient size Laryngoscope Size: Mac and 3 Grade View: Grade I Tube type: Oral Tube size: 7.5 mm Number of attempts: 1 Airway Equipment and Method: Stylet and Oral airway Placement Confirmation: ETT inserted through vocal cords under direct vision, positive ETCO2 and breath sounds checked- equal and bilateral Secured at: 22 cm Tube secured with: Tape Dental Injury: Teeth and Oropharynx as per pre-operative assessment  Comments: Intubated by Lauraine Rana, SRNA

## 2024-05-10 NOTE — Op Note (Signed)
 Consuela Zara PROCEDURE DATE: 05/10/2024  PREOPERATIVE DIAGNOSIS: Heavy bleeding, Pain with IUD, Uterine fibroid POSTOPERATIVE DIAGNOSIS: The same PROCEDURE: Total laparoscopic hysterectomy, bilateral salpingectomy, cystoscopy, TAP block SURGEON:  Dr. Vina Solian ASSISTANT:  Dr. Hildegard Quarry.  An experienced assistant was required given the standard of surgical care given the complexity of the case.  This assistant was needed for exposure, dissection, suctioning, retraction, instrument exchange, and for overall help during the procedure.  INDICATIONS: 48 y.o. [id number]  here for definitive surgical management secondary to the indications listed under preoperative diagnoses; please see preoperative note for further details.  Risks of surgery were discussed with the patient including but not limited to: bleeding which may require transfusion or reoperation; infection which may require antibiotics; injury to bowel, bladder, ureters or other surrounding organs; need for additional procedures; thromboembolic phenomenon, incisional problems and other postoperative/anesthesia complications. Written informed consent was obtained.    FINDINGS:  NEFG. Normal appearing cervix. Uterus was normal size. Normal appearing tubal remnants and normal ovaries bilaterally. Some scar tissue in the LUQ from her bypass which included only omentum, no bowel.   ANESTHESIA:    General INTRAVENOUS FLUIDS: 800  ml ESTIMATED BLOOD LOSS: 25 ml URINE OUTPUT: 600 ml   SPECIMENS: Uterus, cervix, bilateral fallopian tubes COMPLICATIONS: None immediate  PROCEDURE IN DETAIL:  The patient received intravenous antibiotics and had sequential compression devices applied to her lower extremities while in the preoperative area.  She was then taken to the operating room where general anesthesia was administered and was found to be adequate.  She was placed in the dorsal lithotomy position, and was prepped and draped in a sterile manner.     After an adequate timeout was performed, the Avincula uterine manipulator was placed at this time.  A Foley catheter was inserted into her bladder and attached to constant drainage.   Attention turned to patient's abdomen. Skin incision made with the scalpel in the umbilicus and open technique used for entry using the Kocher clamps and mayo scissors. The fascia was tagged with 0 Vicryl.  Once in, the Comunas port was placed under direct visualization.   Pneumoperitoneum achieved to a pressure of 15.  0 degree laparoscope was used with the 5mm optiview port and trocar to enter the cavity under direct visualization. Inspection below the point of entry revealed no evidence of bowel injury. Patient placed in steep trendelenburg. Lateral 5 mm ports were placed as well as an 8mm suprapubic port all under direct visualization.   I placed a  transversus abdominus plane block  using laparoscopic guidance at T10 and T7 bilaterally, at the junction of the rectus anterior and band of oblique muscles.  20 cc of liposomal bupivacaine  dilated with 20 cc of NS, 40 cc total.   Doyle's bubble technique was used. This was placed for post operative pain management.  The pelvis was then carefully examined.  Attention was turned to the fallopian tubes; these were freed from the underlying mesosalpinx and the uterine attachments using the Enseal  device.  The bilateral round and broad ligaments were then clamped and transected with the Enseal device.  The uterine artery was then skeletonized and a bladder flap was created.  The ureters were noted to be safely away from the area of dissection.  The bladder was then bluntly and sharply dissected off the lower uterine segment.  At this point, attention was turned to the uterine vessels, which were clamped and ligated using the Enseal.  Good hemostasis was noted  overall.  The uterosacral and cardinal ligaments were clamped, cut and ligated bilaterally.  Attention was then turned to  the cervicovaginal junction, and the bovie hook was used to transect the cervix from the surrounding vagina using the ring of the Avincula as a guide. This was done circumferentially allowing total hysterectomy.  The uterus was then removed from the vagina and the vaginal cuff incision was then closed with 0 V-lock.  Overall excellent hemostasis was noted.  The ureters were reexamined bilaterally and were pulsating normally. The abdominal pressure was reduced and hemostasis was confirmed.    The foley was removed and then cystoscopy was then performed and it showed brisk bilateral ureteral jets.  Cuff was digital exam and noted to be fully closed. Full survey done of the bladder and no sutures were visualized in the bladder during cystoscopy and no injury was present.   After changing gown and gloves, all trocars were removed under direct visualization, and the abdomen was desufflated. The umbilical fascia was closed in a running fashion with 0 Vicryl. All skin incisions were closed with 4-0 Monocryl subcuticular stitches and Dermabond. The patient tolerated the procedures well.    All instruments, needles, and sponge counts were correct x 2. The patient was taken to the recovery room awake, extubated and in stable condition.    Vina Solian, MD, FACOG Obstetrician & Gynecologist, [facility] for [facility], [facility]

## 2024-05-10 NOTE — Anesthesia Postprocedure Evaluation (Signed)
 Anesthesia Post Note  Patient: [name]  Procedure(s) Performed: HYSTERECTOMY, TOTAL, LAPAROSCOPIC, WITH SALPINGECTOMY (Bilateral: Abdomen) CYSTOSCOPY (Bladder)     Patient location during evaluation: PACU Anesthesia Type: General Level of consciousness: awake and alert, oriented and patient cooperative Pain management: pain level controlled Vital Signs Assessment: post-procedure vital signs reviewed and stable Respiratory status: spontaneous breathing, nonlabored ventilation and respiratory function stable Cardiovascular status: blood pressure returned to baseline and stable Postop Assessment: no apparent nausea or vomiting Anesthetic complications: no   No notable events documented.  Last Vitals:  Vitals:   05/10/24 1415 05/10/24 1430  BP: (!) 167/116 (!) 152/98  Pulse: 99   Resp: (!) 25   Temp:    SpO2: 95%     Last Pain:  Vitals:   05/10/24 1408  TempSrc:   PainSc: 7                  Elizabeth [name],Elizabeth [name]

## 2024-05-10 NOTE — Interval H&P Note (Signed)
 History and Physical Interval Note:  05/10/2024 9:16 AM  Elizabeth [name]  has presented today for surgery, with the diagnosis of Menorrhagia with irregular cycle Pain due to intrauterine contraceptive device, Fibroid.  The various methods of treatment have been discussed with the patient. After consideration of risks, benefits and other options for treatment, the patient has consented to  Procedure(s) with comments: HYSTERECTOMY, TOTAL, LAPAROSCOPIC, WITH SALPINGECTOMY (Bilateral) - W/ TAP Block by Dr. Cleatus CYSTOSCOPY (N/A) as a surgical intervention.  The patient's history has been reviewed, patient examined, no change in status, stable for surgery.  I have reviewed the patient's chart and labs.  Questions were answered to the patient's satisfaction.     Vina Cleatus

## 2024-05-11 ENCOUNTER — Other Ambulatory Visit: Payer: Self-pay | Admitting: Family

## 2024-05-11 ENCOUNTER — Other Ambulatory Visit: Payer: Self-pay

## 2024-05-11 ENCOUNTER — Encounter (HOSPITAL_COMMUNITY): Payer: Self-pay | Admitting: Obstetrics and Gynecology

## 2024-05-11 ENCOUNTER — Encounter: Payer: Self-pay | Admitting: Obstetrics and Gynecology

## 2024-05-11 ENCOUNTER — Other Ambulatory Visit: Payer: Self-pay | Admitting: Obstetrics and Gynecology

## 2024-05-11 MED ORDER — ALBUTEROL SULFATE HFA 108 (90 BASE) MCG/ACT IN AERS: 1.0000 | INHALATION_SPRAY | Freq: Four times a day (QID) | RESPIRATORY_TRACT | 0 refills | Status: DC | PRN

## 2024-05-11 NOTE — Progress Notes (Signed)
 Per Dr. Cleatus, Rx for albuterol . Patient with close follow up  Rx sent.  Dorita Delon FERNS, NP 05/11/2024 4:48 PM

## 2024-05-12 ENCOUNTER — Encounter: Payer: Self-pay | Admitting: Obstetrics and Gynecology

## 2024-05-12 ENCOUNTER — Encounter: Payer: Self-pay | Admitting: Family

## 2024-05-12 LAB — SURGICAL PATHOLOGY

## 2024-05-13 MED ORDER — OXYCODONE-ACETAMINOPHEN 10-325 MG PO TABS: 1.0000 | ORAL_TABLET | ORAL | 0 refills | Status: DC | PRN

## 2024-05-15 ENCOUNTER — Encounter: Payer: Self-pay | Admitting: Family

## 2024-05-15 ENCOUNTER — Other Ambulatory Visit: Payer: Self-pay | Admitting: Family

## 2024-05-18 ENCOUNTER — Emergency Department (HOSPITAL_BASED_OUTPATIENT_CLINIC_OR_DEPARTMENT_OTHER): Admission: EM | Admit: 2024-05-18 | Discharge: 2024-05-18 | Discharge status: Against Medical Advice

## 2024-05-18 ENCOUNTER — Ambulatory Visit: Admitting: Family

## 2024-05-18 ENCOUNTER — Other Ambulatory Visit: Payer: Self-pay

## 2024-05-18 NOTE — ED Notes (Signed)
 Patient entered the triage room and introductions were completed. When asked the reason for the visit, the patient, in a distressed tone, stated, The pain. This RN gently inquired about the location of the pain, at which point the patient became visibly upset and emotional. She stated, I have to tell the full story because it's all connected.  This RN explained that the intention was simply to understand the location of her pain, as she appeared to be in significant distress. The patient then became tearful and stated that she no longer wished to be seen at this facility, expressing a desire to go to [facility] instead, and asked her son to take her there.  This RN, along with the triage nurse technicians, attempted to comfort and reassure the patient, emphasizing that we were here to support her and provide the care she needed. Despite our efforts, the patient remained reluctant to accept care and ultimately requested to be transported via wheelchair to the lobby.  The patient was transported to the lobby via wheelchair and ultimately left the facility before triage could be completed.

## 2024-05-20 ENCOUNTER — Encounter: Payer: Self-pay | Admitting: Obstetrics and Gynecology

## 2024-05-21 ENCOUNTER — Other Ambulatory Visit: Payer: Self-pay

## 2024-05-24 ENCOUNTER — Encounter: Payer: Self-pay | Admitting: Family

## 2024-05-25 ENCOUNTER — Other Ambulatory Visit: Payer: Self-pay | Admitting: Family

## 2024-05-25 ENCOUNTER — Ambulatory Visit: Admitting: Family

## 2024-05-25 ENCOUNTER — Inpatient Hospital Stay: Admitting: Family

## 2024-05-25 MED ORDER — ONDANSETRON 4 MG PO TBDP
4.0000 mg | ORAL_TABLET | Freq: Three times a day (TID) | ORAL | 0 refills | Status: DC | PRN
Start: 2024-05-25 — End: 2024-06-28

## 2024-05-26 ENCOUNTER — Ambulatory Visit (INDEPENDENT_AMBULATORY_CARE_PROVIDER_SITE_OTHER): Admitting: Obstetrics and Gynecology

## 2024-05-26 VITALS — BP 137/93 | HR 87 | Wt 247.0 lb

## 2024-05-26 DIAGNOSIS — R300 Dysuria: Secondary | ICD-10-CM

## 2024-05-26 MED ORDER — BISACODYL 10 MG RE SUPP: 10.0000 mg | Freq: Every day | RECTAL | 0 refills | Status: DC | PRN

## 2024-05-26 MED ORDER — SENNA 8.6 MG PO TABS: 2.0000 | ORAL_TABLET | Freq: Every evening | ORAL | 1 refills | Status: DC | PRN

## 2024-05-26 NOTE — Progress Notes (Signed)
   POST OPERATIVE GYNECOLOGY VISIT  Subjective:  Elizabeth [name] is a 48 y.o. [id number] 2 weeks s/p TLH/BS/cysto with Dr. Cleatus presenting for persistent post op pain  Has sent several messages to the office regarding severe pain that is not improving. Is taking percocet 7.5-325 q6-8 hours and robaxin . Pain is mostly in her low back and lower abdomen. She also has suprapubic pain after urination and some dysuria externally while voiding. In the 2 weeks since surgery she has had a total of 3 bowel movements. She is tolerating a regular diet and ambulating without difficulty. She was seen in the ED with pain concerns on 7/29 - CBC, CMP, UA and CT A/P were all normal and reassuring. She presents today for in office evaluation.   Objective:   Vitals:   05/26/24 0941  BP: (!) 137/93  Pulse: 87  Weight: 247 lb (112 kg)    General:  Alert, oriented and cooperative. Patient is in no acute distress.  Skin: Skin is warm and dry. No rash noted.   Cardiovascular: Normal heart rate noted  Respiratory: Normal respiratory effort, no problems with respiration noted  Abdomen: Soft, non-tender, non-distended. Lap sites healing well without evidence of cellulitis +paraspinal lumbar tenderness   Pelvic: NEFG. Cuff visually intact and healing well  Exam performed in the presence of a chaperone  Assessment and Plan:  Elizabeth [name] is a 48 y.o. with post op pain  - Suspect this is normal post op pain in patient on chronic opioids who is unable to take NSAIDs due to bypass surgery in combination with MSK pain and constipation. Workup thus far has been reassuring and exam today is reassuring - Will add topical medications for low back pain (has voltaren gel at home) - Start senna at bedtime and suppositories if no improvement  Return in 19 days (on 06/14/2024) for post op appointment.  Future Appointments  Date Time Provider Department Center  06/14/2024  1:10 PM Cleatus Moccasin, MD [facility]   08/23/2024 11:00 AM Dartha Ernst, MD [facility] None  10/05/2024  2:00 PM Daryl Setter, NP [facility]  11/23/2024 10:30 AM Alm Delon SAILOR, DO [facility] None   Elizabeth JAYSON Carolin, MD

## 2024-05-26 NOTE — Progress Notes (Signed)
 Pt states she has been having lower pelvic and back pain since surgery.  Pt complains of urinary symptoms - will leave sample today.

## 2024-05-26 NOTE — Patient Instructions (Signed)
-   Senna (sennakot) - pills that help intestines move bowels - Dulcolax suppository - rectal medicine that helps clear out bowels

## 2024-05-28 ENCOUNTER — Ambulatory Visit: Payer: Self-pay | Admitting: Obstetrics and Gynecology

## 2024-05-28 LAB — URINE CULTURE

## 2024-06-01 ENCOUNTER — Telehealth: Payer: Self-pay | Admitting: Family

## 2024-06-01 ENCOUNTER — Other Ambulatory Visit: Payer: Self-pay | Admitting: Medical Genetics

## 2024-06-01 NOTE — Telephone Encounter (Signed)
-----   Message from Elizabeth [name] sent at 05/31/2024  2:14 PM EDT ----- Given her hgba1c is above 8 would maybe add Farxiga  and recheck levels in 3-6 months ----- Message ----- From: Daryl Setter, NP Sent: 04/28/2024   8:12 AM EDT To: Elizabeth DELENA Horton, MD  Hi,  So her 24 hr urine for protein is a bit elevated, but not in nephrotic range. She is diabetic.  She is on an ACE.  Would you just watch this or would you refer to nephrology?   Thanks,  Kristian Hazzard

## 2024-06-04 ENCOUNTER — Encounter: Payer: Self-pay | Admitting: Family

## 2024-06-07 ENCOUNTER — Encounter: Payer: Self-pay | Admitting: Family

## 2024-06-07 LAB — HM DIABETES EYE EXAM

## 2024-06-09 ENCOUNTER — Ambulatory Visit (INDEPENDENT_AMBULATORY_CARE_PROVIDER_SITE_OTHER): Admitting: Family

## 2024-06-09 VITALS — BP 129/83 | HR 98 | Temp 98.1°F | Resp 16 | Ht 62.0 in | Wt 243.0 lb

## 2024-06-09 DIAGNOSIS — R300 Dysuria: Secondary | ICD-10-CM | POA: Diagnosis not present

## 2024-06-09 DIAGNOSIS — K5900 Constipation, unspecified: Secondary | ICD-10-CM | POA: Diagnosis not present

## 2024-06-09 LAB — URINALYSIS, ROUTINE W REFLEX MICROSCOPIC
Hgb urine dipstick: NEGATIVE
Leukocytes,Ua: NEGATIVE
Nitrite: NEGATIVE
Specific Gravity, Urine: 1.015 (ref 1.000–1.030)
Total Protein, Urine: NEGATIVE
Urine Glucose: >=1000 — AB
Urobilinogen, UA: 2 — AB (ref 0.0–1.0)
pH: 6 (ref 5.0–8.0)

## 2024-06-09 NOTE — Progress Notes (Signed)
 Subjective:     Patient ID: Elizabeth [name], female    DOB: 30-Nov-1975, 48 y.o.   MRN: [id number]  Chief Complaint  Patient presents with   Abdominal Pain    Patient complains of abdominal pain   Dysuria    Patient complains of dysuria    Abdominal Pain Associated symptoms include dysuria.  Dysuria     Discussed the use of AI scribe software for clinical note transcription with the patient, who gave verbal consent to proceed.  History of Present Illness  Elizabeth [name] is a 48 year old female who presents with urinary and abdominal pain following a recent hysterectomy. She is accompanied by her first born son.  Two weeks after her hysterectomy on May 10, 2024, she began experiencing dysuria and abdominal pain post-urination. The pain initially subsided four days post-surgery but returned a week later. She presented to the ED with abdominal pain on 05/18/24.  CT Abdomen performed during that visit was unremarkable.  She experiences ongoing constipation and left sided abdominal pain. She is using Senokot and a suppository with incomplete relief, as bowel movements remain hard. She was advised to take Miralax daily, but it has not been effective.  She has severe upper abdominal pain and has been in contact with her GI specialist. Multiple endoscopies have been performed in the past year, with plans for another pending scheduling.     Health Maintenance Due  Topic Date Due   HPV VACCINES (1 - 3-dose SCDM series) Never done   OPHTHALMOLOGY EXAM  12/12/2022   COVID-19 Vaccine (4 - 2024-25 season) 06/22/2023   INFLUENZA VACCINE  05/21/2024    Past Medical History:  Diagnosis Date   Anemia    Anxiety    Arthritis    Astigmatism    Depression    Diabetes mellitus without complication (HCC)    Dry mouth    Fatigue    GERD (gastroesophageal reflux disease)    Heartburn    Hepatic steatosis    Hepatomegaly    High cholesterol    Hypertension    Keratoconjunctivitis     Liver disease    Muscle pain    Nausea and vomiting 04/21/2023   Ovarian cyst    Positive ANA (antinuclear antibody)    Pulmonary nodules    Stomach ulcer    Vitamin D  deficiency    Weakness     Past Surgical History:  Procedure Laterality Date   BIOPSY  01/18/2021   Procedure: BIOPSY;  Surgeon: San Sandor GAILS, DO;  Location: [facility];  Service: Gastroenterology;;   BIOPSY  10/28/2023   Procedure: BIOPSY;  Surgeon: Lyndel Deward PARAS, MD;  Location: [facility];  Service: General;;   CHOLECYSTECTOMY N/A 05/11/2021   Procedure: LAPAROSCOPIC CHOLECYSTECTOMY WITH INTRAOPERATIVE CHOLANGIOGRAM;  Surgeon: Lyndel Deward PARAS, MD;  Location: [facility];  Service: General;  Laterality: N/A;   CYSTOSCOPY N/A 05/10/2024   Procedure: PHYLLIS;  Surgeon: Cleatus Moccasin, MD;  Location: [facility] OR;  Service: Gynecology;  Laterality: N/A;   DILATION AND CURETTAGE OF UTERUS     ENDOMETRIAL BIOPSY  12/19/2017   normal per pt   ESOPHAGOGASTRODUODENOSCOPY N/A 10/28/2023   Procedure: UPPER ENDOSCOPY;  Surgeon: Lyndel Deward PARAS, MD;  [facility]: [facility];  Service: General;  Laterality: N/A;   ESOPHAGOGASTRODUODENOSCOPY (EGD) WITH PROPOFOL  N/A 01/18/2021   Procedure: ESOPHAGOGASTRODUODENOSCOPY (EGD) WITH PROPOFOL ;  Surgeon: San Sandor GAILS, DO;  Location: [facility];  Service: Gastroenterology;  Laterality: N/A;   HIATAL HERNIA REPAIR N/A 09/30/2022  Procedure: HERNIA REPAIR HIATAL;  Surgeon: Lyndel Deward PARAS, MD;  Location: [facility];  Service: General;  Laterality: N/A;   LAPAROSCOPIC INSERTION GASTROSTOMY TUBE N/A 12/02/2022   Procedure: LAPAROSCOPIC REMNANT GASTROSTOMY G TUBE;  Surgeon: Lyndel Deward PARAS, MD;  Location: [facility];  Service: General;  Laterality: N/A;   LAPAROSCOPIC ROUX-EN-Y GASTRIC BYPASS WITH HIATAL HERNIA REPAIR  09/30/2022   LAPAROSCOPY N/A 12/02/2022   Procedure: LAPAROSCOPY DIAGNOSTIC;  Surgeon: Lyndel Deward PARAS, MD;  Location: [facility];  Service: General;   Laterality: N/A;   TONSILLECTOMY     TOTAL LAPAROSCOPIC HYSTERECTOMY WITH SALPINGECTOMY Bilateral 05/10/2024   Procedure: HYSTERECTOMY, TOTAL, LAPAROSCOPIC, WITH SALPINGECTOMY;  Surgeon: Cleatus Moccasin, MD;  Location: [facility] OR;  Service: Gynecology;  Laterality: Bilateral;  W/ TAP Block by Dr. Cleatus   TUBAL LIGATION     UPPER GI ENDOSCOPY N/A 09/30/2022   Procedure: UPPER GI ENDOSCOPY;  Surgeon: Lyndel Deward PARAS, MD;  Location: [facility];  Service: General;  Laterality: N/A;   UPPER GI ENDOSCOPY N/A 12/02/2022   Procedure: UPPER ENDOSCOPY;  Surgeon: Lyndel Deward PARAS, MD;  Location: [facility];  Service: General;  Laterality: N/A;    Family History  Problem Relation Age of Onset   Diabetes Mother    Hypertension Mother    Hyperlipidemia Mother    Obesity Mother    Peripheral Artery Disease Mother    HIV Father        died from complications (IVDU)   Alcohol abuse Father    Drug abuse Father    Lupus Maternal Aunt    Ovarian cancer Maternal Grandmother    Colon cancer Neg Hx    Esophageal cancer Neg Hx    Liver disease Neg Hx    Pancreatic cancer Neg Hx    Stomach cancer Neg Hx    Sleep apnea Neg Hx    Rectal cancer Neg Hx     Social History   Socioeconomic History   Marital status: Widowed    Spouse name: Francis   Number of children: 2   Years of education: Not on file   Highest education level: Associate degree: occupational, Scientist, product/process development, or vocational program  Occupational History   Occupation: CMA - Geophysicist/field seismologist: [employer]  Tobacco Use   Smoking status: Former    Current packs/day: 0.00    Average packs/day: 0.5 packs/day for 3.0 years (1.5 ttl pk-yrs)    Types: Cigarettes    Start date: 08/21/2018    Quit date: 08/21/2021    Years since quitting: 2.8    Passive exposure: Never   Smokeless tobacco: Never   Tobacco comments:    4-5/day  Vaping Use   Vaping status: Never Used  Substance and Sexual Activity   Alcohol use: No   Drug use: No    Sexual activity: Yes    Partners: Male    Birth control/protection: Surgical, Implant  Other Topics Concern   Not on file  Social History Narrative    2 children   1997- son Harden   2000- son Designer, jewellery in [place] [facility]- CMA in [place]   Enjoys reading   Widowed 22-Jul-2024, husband died from COVID-19.   Social Drivers of Health   Financial Resource Strain: High Risk (04/01/2024)   Overall Financial Resource Strain (CARDIA)    Difficulty of Paying Living Expenses: Very hard  Food Insecurity: Low Risk  (04/13/2024)   Received from [facility]   Hunger Vital Sign  Within the past 12 months, you worried that your food would run out before you got money to buy more: Never true    Within the past 12 months, the food you bought just didn't last and you didn't have money to get more. : Never true  Transportation Needs: No Transportation Needs (04/13/2024)   Received from [facility]   Transportation    In the past 12 months, has lack of reliable transportation kept you from medical appointments, meetings, work or from getting things needed for daily living? : No  Physical Activity: Inactive (04/01/2024)   Exercise Vital Sign    Days of Exercise per Week: 0 days    Minutes of Exercise per Session: Not on file  Stress: Stress Concern Present (04/01/2024)   [employer] of Occupational Health - Occupational Stress Questionnaire    Feeling of Stress: Rather much  Social Connections: Moderately Integrated (04/01/2024)   Social Connection and Isolation Panel    Frequency of Communication with Friends and Family: More than three times a week    Frequency of Social Gatherings with Friends and Family: Never    Attends Religious Services: 1 to 4 times per year    Active Member of [employer] or Organizations: Yes    Attends Banker Meetings: 1 to 4 times per year    Marital Status: Widowed  Recent Concern: Social Connections - Moderately Isolated (01/14/2024)   Social  Connection and Isolation Panel    Frequency of Communication with Friends and Family: Three times a week    Frequency of Social Gatherings with Friends and Family: Never    Attends Religious Services: 1 to 4 times per year    Active Member of [employer] or Organizations: No    Attends Banker Meetings: Not on file    Marital Status: Widowed  Intimate Partner Violence: Not At Risk (12/02/2022)   Humiliation, Afraid, Rape, and Kick questionnaire    Fear of Current or Ex-Partner: No    Emotionally Abused: No    Physically Abused: No    Sexually Abused: No    Outpatient Medications Prior to Visit  Medication Sig Dispense Refill   ACCU-CHEK GUIDE TEST test strip TEST MORNING, NOON, AND AT BEDTIME 250 each 11   Accu-Chek Softclix Lancets lancets 4 (four) times daily.     acetaminophen  (TYLENOL ) 500 MG tablet Take 500-1,000 mg by mouth every 6 (six) hours as needed (pain.).     albuterol  (VENTOLIN  HFA) 108 (90 Base) MCG/ACT inhaler Inhale 1-2 puffs into the lungs every 6 (six) hours as needed for wheezing or shortness of breath. 1 each 0   bisacodyl  (DULCOLAX) 10 MG suppository Place 1 suppository (10 mg total) rectally daily as needed for moderate constipation. 12 suppository 0   Blood Glucose Monitoring Suppl (ACCU-CHEK GUIDE) w/Device KIT USE MORNING, NOON, AND AT BEDTIME AS DIRECTED 1 kit 11   Blood Glucose Monitoring Suppl DEVI 1 each by Does not apply route in the morning, at noon, and at bedtime. May substitute to any manufacturer covered by patient's insurance. 1 each 0   Calcium  Citrate-Vitamin D  (CALCIUM  CITRATE CHEWY BITE PO) Take 1 tablet by mouth at bedtime.     dapagliflozin  propanediol (FARXIGA ) 10 MG TABS tablet Take 1 tablet (10 mg total) by mouth daily before breakfast. 90 tablet 1   Dulaglutide  (TRULICITY ) 3 MG/0.5ML SOAJ Inject 3 mg as directed once a week. (Patient taking differently: Inject 3 mg as directed every Sunday.) 6 mL 0  escitalopram  (LEXAPRO ) 20 MG tablet  Take 1 tablet (20 mg total) by mouth daily. 90 tablet 1   esomeprazole  (NEXIUM ) 40 MG capsule Take 1 capsule (40 mg total) by mouth 2 (two) times daily. Open capsule and sprinkle on food at breakfast and dinner for better absorption. 180 capsule 3   famotidine  (PEPCID ) 20 MG tablet Take 1 tablet (20 mg total) by mouth 2 (two) times daily. 180 tablet 1   gabapentin  (NEURONTIN ) 300 MG capsule Take 1 capsule (300 mg total) by mouth 3 (three) times daily. 270 capsule 1   hydrOXYzine  (VISTARIL ) 25 MG capsule Take 1 capsule (25 mg total) by mouth every 8 (eight) hours as needed for anxiety. 30 capsule 0   insulin  lispro (HUMALOG  KWIKPEN) 100 UNIT/ML KwikPen Inject 3-7 Units into the skin 3 (three) times daily. 3 units if blood sugar is more than 200, 5 units if blood sugar is more than 250, 7 units if blood sugar is more than 300 9 mL 1   Lancets Misc. (ACCU-CHEK SOFTCLIX LANCET DEV) KIT USE MORNING, NOON, AND AT BEDTIME 1 kit 11   lisinopril  (ZESTRIL ) 10 MG tablet Take 1 tablet (10 mg total) by mouth daily. (Patient taking differently: Take 5 mg by mouth daily.) 90 tablet 0   metFORMIN  (GLUCOPHAGE -XR) 500 MG 24 hr tablet Take 2 tablets (1,000 mg total) by mouth 2 (two) times daily with a meal. 360 tablet 0   methocarbamol  (ROBAXIN ) 500 MG tablet Take 1 tablet (500 mg total) by mouth every 8 (eight) hours as needed for muscle spasms. 90 tablet 1   Multiple Vitamins-Minerals (BARIATRIC FUSION PO) Take 1 tablet by mouth in the morning.     naloxone (NARCAN) nasal spray 4 mg/0.1 mL Place 1 spray into the nose as needed.     ondansetron  (ZOFRAN -ODT) 4 MG disintegrating tablet Take 1 tablet (4 mg total) by mouth every 8 (eight) hours as needed for nausea or vomiting. 90 tablet 0   oxyCODONE -acetaminophen  (PERCOCET) 7.5-325 MG tablet Take 1 tablet by mouth 4 (four) times daily as needed (pain.).     rosuvastatin  (CRESTOR ) 20 MG tablet Take 1 tablet (20 mg total) by mouth daily. (Patient taking differently: Take 20  mg by mouth at bedtime.) 90 tablet 3   senna (SENOKOT) 8.6 MG TABS tablet Take 2 tablets (17.2 mg total) by mouth at bedtime as needed for mild constipation. 30 tablet 1   spironolactone  (ALDACTONE ) 50 MG tablet Take 1 tablet (50 mg total) by mouth 2 (two) times daily. Will start with 50 mg twice daily, and increase to 100 mg twice daily as needed 60 tablet 2   sucralfate  (CARAFATE ) 1 GM/10ML suspension Take 1 g by mouth in the morning, at noon, in the evening, and at bedtime.     traZODone  (DESYREL ) 50 MG tablet Take 1 tablet (50 mg total) by mouth at bedtime. 90 tablet 0   No facility-administered medications prior to visit.    Allergies  Allergen Reactions   Nsaids     Pt reported due to Bariatric surgery 09/2022   Tomato Hives, Itching and Rash    Review of Systems  Gastrointestinal:  Positive for abdominal pain.  Genitourinary:  Positive for dysuria.       Objective:    Physical Exam Constitutional:      General: She is not in acute distress.    Appearance: Normal appearance. She is well-developed.  HENT:     Head: Normocephalic and atraumatic.  Right Ear: External ear normal.     Left Ear: External ear normal.  Eyes:     General: No scleral icterus. Neck:     Thyroid : No thyromegaly.  Cardiovascular:     Rate and Rhythm: Normal rate and regular rhythm.     Heart sounds: Normal heart sounds. No murmur heard. Pulmonary:     Effort: Pulmonary effort is normal. No respiratory distress.     Breath sounds: Normal breath sounds. No wheezing.  Abdominal:     Palpations: Abdomen is soft.     Comments: + LUQ and LLQ tenderness to palpation  Musculoskeletal:     Cervical back: Neck supple.  Skin:    General: Skin is warm and dry.  Neurological:     Mental Status: She is alert and oriented to person, place, and time.  Psychiatric:        Mood and Affect: Mood normal.        Behavior: Behavior normal.        Thought Content: Thought content normal.        Judgment:  Judgment normal.      BP 129/83 (BP Location: Right Arm, Patient Position: Sitting, Cuff Size: Normal)   Pulse 98   Temp 98.1 F (36.7 C) (Oral)   Resp 16   Ht 5' 2 (1.575 m)   Wt 243 lb (110.2 kg)   LMP 11/20/2023   SpO2 100%   BMI 44.45 kg/m  Wt Readings from Last 3 Encounters:  06/09/24 243 lb (110.2 kg)  05/26/24 247 lb (112 kg)  05/10/24 249 lb (112.9 kg)       Assessment & Plan:   Problem List Items Addressed This Visit       Unprioritized   Dysuria - Primary   New.  Obtain UA/Culture to further evaluate.       Relevant Orders   Urinalysis, Routine w reflex microscopic   Urine Culture   Constipation   Given that her stools remain hard balls, I think she is still constipated and only voiding small amounts of retained stool.  I think this is the most likely cause for her left sided abdominal pain. She is having minimal relief with senokot and suppositories.  I recommended a Miralax cleanout to see if this helps her symptoms. See details in AVS.       I am having Dametria Sundby maintain her esomeprazole , Blood Glucose Monitoring Suppl, Accu-Chek Softclix Lancets, naloxone, oxyCODONE -acetaminophen , spironolactone , lisinopril , traZODone , Accu-Chek Softclix Lancet Dev, dapagliflozin  propanediol, metFORMIN , rosuvastatin , escitalopram , famotidine , gabapentin , Trulicity , insulin  lispro, methocarbamol , hydrOXYzine , Multiple Vitamins-Minerals (BARIATRIC FUSION PO), Calcium  Citrate-Vitamin D  (CALCIUM  CITRATE CHEWY BITE PO), acetaminophen , sucralfate , Accu-Chek Guide, albuterol , ondansetron , Accu-Chek Guide Test, senna, and bisacodyl .  No orders of the defined types were placed in this encounter.

## 2024-06-09 NOTE — Assessment & Plan Note (Signed)
 New.  Obtain UA/Culture to further evaluate.

## 2024-06-09 NOTE — Assessment & Plan Note (Signed)
 Given that her stools remain hard balls, I think she is still constipated and only voiding small amounts of retained stool.  I think this is the most likely cause for her left sided abdominal pain. She is having minimal relief with senokot and suppositories.  I recommended a Miralax cleanout to see if this helps her symptoms. See details in AVS.

## 2024-06-09 NOTE — Patient Instructions (Addendum)
 Please do a miralax clean out as follows:  Mix the ENTIRE bottle of MiraLAX (either 238 or 255 grams) into 64 ounces of sugar free Gatorade or  another clear liquid, EXCEPT WATER . DO NOT MIX IN WATER ! Clear liquids include: Gatorade,  other sports drink, clear juices (apple, white grape, or cranberry, etc. NO PULP), lemonade,  and Laurielle Light. Shake until MiraLAX is dissolved.  Start taking the MiraLAX bowel clean out solution at noon or as early as you can. Do not take  later then 6:00 p.m., or you will not get any sleep.  Drink one 8 ounce glass of the MiraLAX solution every 30-60 minutes until the solution is gone.  This will take approximately 4-8 hours to drink. It is necessary to drink all of the solution to  make sure that your colon is clean.  If you become nauseated or feel full, stop drinking for at least 30 minutes. Then resume the  MiraLAX solution using smaller amounts (4-6 ounces) every 45-60 minutes. It will be necessary to drink all of the solutions to make sure that your colon is clean. The stoo

## 2024-06-10 ENCOUNTER — Other Ambulatory Visit: Payer: Self-pay | Admitting: "Endocrinology

## 2024-06-10 ENCOUNTER — Other Ambulatory Visit: Payer: Self-pay | Admitting: Sports Medicine

## 2024-06-10 LAB — URINE CULTURE
MICRO NUMBER:: 16858449
SPECIMEN QUALITY:: ADEQUATE

## 2024-06-11 ENCOUNTER — Ambulatory Visit: Payer: Self-pay | Admitting: Family

## 2024-06-11 ENCOUNTER — Encounter: Payer: Self-pay | Admitting: Family

## 2024-06-13 NOTE — Progress Notes (Unsigned)
 GYNECOLOGY OFFICE VISIT NOTE  History:  Elizabeth [name] is a 48 y.o. [id number] here today for postop check s/p TLH, BS, cystoscopy and TAP block.   Since surgery she reports some bowel dysfunction. She saw her GI and they are working on that. Some urinary symptoms but she saw PCP and that is improved.   Otherwise she is doing well. She does admit to intercourse 2 days ago. She had not been sexually active 5 years prior to this. Denies any issues with intercourse though.     The following portions of the patient's history were reviewed and updated as appropriate: allergies, current medications, past family history, past medical history, past social history, past surgical history and problem list.   Review of Systems:  Pertinent items noted in HPI and remainder of comprehensive ROS otherwise negative.  Physical Exam:  BP 138/85   Pulse 91   Ht 5' 2 (1.575 m)   Wt 238 lb (108 kg)   LMP 11/20/2023   BMI 43.53 kg/m  CONSTITUTIONAL: Well-developed, well-nourished female in no acute distress.  HEENT:  Normocephalic, atraumatic. External right and left ear normal. No scleral icterus.  NECK: Normal range of motion, supple, no masses noted on observation SKIN: No rash noted. Not diaphoretic. No erythema. No pallor. MUSCULOSKELETAL: Normal range of motion. No edema noted. NEUROLOGIC: Alert and oriented to person, place, and time. Normal muscle tone coordination. No cranial nerve deficit noted. PSYCHIATRIC: Normal mood and affect. Normal behavior. Normal judgment and thought content.  ABDOMEN: No masses noted. No other overt distention noted.  L/s incisions well healed  PELVIC: Deferred  Labs and Imaging Results for orders placed or performed in visit on 06/09/24 (from the past week)  Urine Culture   Collection Time: 06/09/24  2:06 PM   Specimen: Urine  Result Value Ref Range   MICRO NUMBER: [id number]    SPECIMEN QUALITY: Adequate    Sample Source NOT GIVEN    STATUS: FINAL     Result:      Less than 10,000 CFU/mL of single Gram positive organism isolated. No further testing will be performed. If clinically indicated, recollection using a method to minimize contamination, with prompt transfer to Urine Culture Transport Tube, is recommended.  Urinalysis, Routine w reflex microscopic   Collection Time: 06/09/24  2:06 PM  Result Value Ref Range   Color, Urine YELLOW Yellow;Lt. Yellow;Straw;Dark Yellow;Amber;Green;Red;Brown   APPearance Sl Cloudy (A) Clear;Turbid;Slightly Cloudy;Cloudy   Specific Gravity, Urine 1.015 1.000 - 1.030   pH 6.0 5.0 - 8.0   Total Protein, Urine NEGATIVE Negative   Urine Glucose >=1000 (A) Negative   Ketones, ur TRACE (A) Negative   Bilirubin Urine SMALL (A) Negative   Hgb urine dipstick NEGATIVE Negative   Urobilinogen, UA 2.0 (A) 0.0 - 1.0   Leukocytes,Ua NEGATIVE Negative   Nitrite NEGATIVE Negative   WBC, UA 3-6/hpf (A) 0-2/hpf   RBC / HPF 0-2/hpf 0-2/hpf   Mucus, UA Presence of (A) None   Squamous Epithelial / HPF Rare(0-4/hpf) Rare(0-4/hpf)   Bacteria, UA Rare(<10/hpf) (A) None   Hyaline Casts, UA Presence of (A) None   Amorphous Present (A) None;Present   *Note: Due to a large number of results and/or encounters for the requested time period, some results have not been displayed. A complete set of results can be found in Results Review.   No results found.  Assessment and Plan:  1. Postop check (Primary) Reviewed importance of pelvic rest until cleared.  No other restrictions at  this time.  Healing well.  RTO in 6-7wks   No orders of the defined types were placed in this encounter.    Routine preventative health maintenance measures emphasized. Please refer to After Visit Summary for other counseling recommendations.   No follow-ups on file.  Vina Solian, MD, FACOG Obstetrician & Gynecologist, [facility] for [facility], [facility]

## 2024-06-14 ENCOUNTER — Ambulatory Visit (INDEPENDENT_AMBULATORY_CARE_PROVIDER_SITE_OTHER): Admitting: Obstetrics and Gynecology

## 2024-06-14 ENCOUNTER — Encounter: Payer: Self-pay | Admitting: Obstetrics and Gynecology

## 2024-06-14 ENCOUNTER — Encounter: Payer: Self-pay | Admitting: Sports Medicine

## 2024-06-14 VITALS — BP 138/85 | HR 91 | Ht 62.0 in | Wt 238.0 lb

## 2024-06-14 DIAGNOSIS — Z09 Encounter for follow-up examination after completed treatment for conditions other than malignant neoplasm: Secondary | ICD-10-CM

## 2024-06-15 ENCOUNTER — Other Ambulatory Visit (HOSPITAL_BASED_OUTPATIENT_CLINIC_OR_DEPARTMENT_OTHER): Payer: Self-pay

## 2024-06-18 ENCOUNTER — Other Ambulatory Visit: Payer: Self-pay | Admitting: "Endocrinology

## 2024-06-22 ENCOUNTER — Encounter: Payer: Self-pay | Admitting: Sports Medicine

## 2024-06-24 ENCOUNTER — Other Ambulatory Visit: Payer: Self-pay

## 2024-06-24 DIAGNOSIS — E1165 Type 2 diabetes mellitus with hyperglycemia: Secondary | ICD-10-CM

## 2024-06-24 MED ORDER — METFORMIN HCL ER 500 MG PO TB24: 1000.0000 mg | ORAL_TABLET | Freq: Two times a day (BID) | ORAL | 0 refills | Status: DC

## 2024-06-24 NOTE — Telephone Encounter (Signed)
 Requested Prescriptions   Signed Prescriptions Disp Refills   metFORMIN  (GLUCOPHAGE -XR) 500 MG 24 hr tablet 360 tablet 0    Sig: Take 2 tablets (1,000 mg total) by mouth 2 (two) times daily with a meal.    Authorizing Provider: DARTHA ERNST    Ordering User: ARLOA JEOFFREY SAILOR

## 2024-06-28 ENCOUNTER — Encounter: Payer: Self-pay | Admitting: Family

## 2024-06-28 DIAGNOSIS — E118 Type 2 diabetes mellitus with unspecified complications: Secondary | ICD-10-CM | POA: Insufficient documentation

## 2024-06-28 MED ORDER — ONDANSETRON 4 MG PO TBDP: 4.0000 mg | ORAL_TABLET | Freq: Three times a day (TID) | ORAL | 0 refills | Status: DC | PRN

## 2024-06-29 ENCOUNTER — Ambulatory Visit

## 2024-07-02 ENCOUNTER — Ambulatory Visit: Admitting: Family

## 2024-07-07 ENCOUNTER — Ambulatory Visit: Admitting: "Endocrinology

## 2024-07-08 ENCOUNTER — Other Ambulatory Visit (HOSPITAL_COMMUNITY)

## 2024-07-12 ENCOUNTER — Other Ambulatory Visit: Payer: Self-pay | Admitting: Sports Medicine

## 2024-07-13 ENCOUNTER — Other Ambulatory Visit: Payer: Self-pay | Admitting: Family

## 2024-07-13 ENCOUNTER — Other Ambulatory Visit: Payer: Self-pay | Admitting: "Endocrinology

## 2024-07-13 ENCOUNTER — Ambulatory Visit: Admitting: Family

## 2024-07-14 ENCOUNTER — Other Ambulatory Visit (HOSPITAL_COMMUNITY)

## 2024-07-14 ENCOUNTER — Other Ambulatory Visit: Payer: Self-pay | Admitting: "Endocrinology

## 2024-07-14 DIAGNOSIS — E1165 Type 2 diabetes mellitus with hyperglycemia: Secondary | ICD-10-CM

## 2024-07-15 ENCOUNTER — Encounter: Payer: Self-pay | Admitting: Family

## 2024-07-15 ENCOUNTER — Encounter: Payer: Self-pay | Admitting: Sports Medicine

## 2024-07-15 MED ORDER — METHOCARBAMOL 500 MG PO TABS: 500.0000 mg | ORAL_TABLET | Freq: Three times a day (TID) | ORAL | 0 refills | Status: DC | PRN

## 2024-07-15 NOTE — Addendum Note (Signed)
 Addended by: MARCINE HARLENE SAILOR on: 07/15/2024 02:42 PM   Modules accepted: Orders

## 2024-07-15 NOTE — Telephone Encounter (Signed)
 Requested Prescriptions   Pending Prescriptions Disp Refills   metFORMIN  (GLUCOPHAGE -XR) 500 MG 24 hr tablet [Pharmacy Med Name: METFORMIN  ER 500MG  TAB 500 Tablet] 360 tablet 11    Sig: TAKE 2 TABLETS BY MOUTH TWICE DAILY WITH MEALS

## 2024-07-16 MED ORDER — ONDANSETRON 4 MG PO TBDP: 4.0000 mg | ORAL_TABLET | Freq: Three times a day (TID) | ORAL | 0 refills | Status: DC | PRN

## 2024-07-21 ENCOUNTER — Other Ambulatory Visit

## 2024-07-26 ENCOUNTER — Ambulatory Visit (INDEPENDENT_AMBULATORY_CARE_PROVIDER_SITE_OTHER)

## 2024-07-26 VITALS — Ht 62.0 in | Wt 239.0 lb

## 2024-07-26 DIAGNOSIS — M67911 Unspecified disorder of synovium and tendon, right shoulder: Secondary | ICD-10-CM | POA: Diagnosis present

## 2024-07-26 DIAGNOSIS — M7531 Calcific tendinitis of right shoulder: Secondary | ICD-10-CM

## 2024-07-26 DIAGNOSIS — G8929 Other chronic pain: Secondary | ICD-10-CM | POA: Diagnosis not present

## 2024-07-26 DIAGNOSIS — M25511 Pain in right shoulder: Secondary | ICD-10-CM | POA: Diagnosis not present

## 2024-07-26 NOTE — Progress Notes (Signed)
   Subjective:    Patient ID: Elizabeth [name], female    DOB: 49 y.o., 1975-12-06   MRN: [id number]  HPI  Chief Complaint: Chronic right shoulder pain                    Discussed the use of AI scribe software for clinical note transcription with the patient, who gave verbal consent to proceed.  History of Present Illness Elizabeth [name] is a 48 year old female who presents with worsening shoulder pain. She is accompanied by her son, Elizabeth [name].  Shoulder pain and functional limitation - Shoulder pain present since March 2024, progressively worsening - Pain localized to the top and back of the shoulder - Pain intensity increased following hysterectomy in July 2024 - Pain exacerbated by reaching behind the back, fastening a bra strap, and brushing hair - No specific injury or change in activity associated with symptom progression - right hand dominant  Neurological symptoms - Numbness and tingling radiating toward the back of the arm near the elbow - No numbness or tingling extending to the hand  Imaging and diagnostic findings - MRI in April 2024 demonstrated supraspinatus tendon pathology and arthritis in the acromioclavicular and glenohumeral joints  Prior interventions and response - Attended two physical therapy sessions, discontinued due to hysterectomy - Uses heat and ice for pain management, with ice providing greater relief - Applies Voltaren gel once or twice daily  Medication use - Current medications include oxycodone  10 mg, gabapentin , and methocarbamol   Arthritis evaluation - Rheumatologist excluded rheumatoid arthritis, diagnosed non-rheumatoid arthritis     Objective:   Physical Exam There were no vitals filed for this visit.  right Shoulder ( compared to normal ) Inspection: - swelling, - scapular dyskinesis Palpation: TTP - greater tuberosity, - AC joint, + biceps tendon, - posterior shoulder AROM/PROM: 90 forward flexion, 90 abduction, -5 external rotation,  internal rotation to lumbar spine Strength: 5/5 lift off, 5/5 empty can, 5/5 external rotation, 5/5 flexion, + to equivocal drop arm test Special tests:    -Rotator Cuff: + Neer's, + Hawkin's, + empty can, + painful arc   -Labrum: - O'brien's, - Jerk   -Biceps: + speed's, - yergason's    -AC Joint: - cross arm testing       Assessment & Plan:   Assessment & Plan Right shoulder rotator cuff tear with tendinitis, impingement, and osteoarthritis   Chronic right shoulder pain has worsened since April, with MRI findings indicating supraspinatus tendon issues and arthritis in the [facility] and glenohumeral joints. Pain occurs with arm elevation and reaching behind the back, consistent with rotator cuff pathology. Recent exacerbation is likely due to muscle weakness from inactivity post-hysterectomy. There is concern for progression of the rotator cuff tear, particularly involving the supraspinatus and possibly other rotator cuff muscles. No systemic inflammatory arthritis is present. Pain management includes oxycodone , gabapentin , and methocarbamol , with some relief from Voltaren gel and cold therapy. Steroid injections are not recommended due to potential tendon weakening and gastrointestinal issues post-bariatric surgery. Order an MRI of the right shoulder to assess tear progression as current symptoms are different. Advise using Voltaren gel three to four times daily and continue cold therapy as needed in effort to minimize oral nsaids post gastric bypass surgery. Coordinate follow-up via phone or video visit to discuss MRI results and next steps.

## 2024-08-01 ENCOUNTER — Ambulatory Visit (HOSPITAL_BASED_OUTPATIENT_CLINIC_OR_DEPARTMENT_OTHER)
Admission: RE | Admit: 2024-08-01 | Discharge: 2024-08-01 | Disposition: A | Discharge status: Home / Self Care | Source: Ambulatory Visit

## 2024-08-01 DIAGNOSIS — M67911 Unspecified disorder of synovium and tendon, right shoulder: Secondary | ICD-10-CM | POA: Diagnosis present

## 2024-08-01 DIAGNOSIS — M7531 Calcific tendinitis of right shoulder: Secondary | ICD-10-CM | POA: Diagnosis present

## 2024-08-01 DIAGNOSIS — G8929 Other chronic pain: Secondary | ICD-10-CM | POA: Insufficient documentation

## 2024-08-01 DIAGNOSIS — M25511 Pain in right shoulder: Secondary | ICD-10-CM | POA: Diagnosis present

## 2024-08-05 ENCOUNTER — Telehealth

## 2024-08-05 VITALS — Ht 62.0 in | Wt 239.0 lb

## 2024-08-05 DIAGNOSIS — G540 Brachial plexus disorders: Secondary | ICD-10-CM

## 2024-08-05 NOTE — Progress Notes (Addendum)
   Subjective:    Patient ID: Elizabeth [name], female    DOB: 48 y.o., August 28, 1976   MRN: [id number]  Chief Complaint: Video visit for MRI review  Patient location: [place], [place]   Provider location: [facility], [place]   Discussed the use of AI scribe software for clinical note transcription with the patient, who gave verbal consent to proceed.  History of Present Illness Elizabeth [name] is a 48 year old female who presents with shoulder pain.  Shoulder pain - Persistent and severe pain in the shoulder - Pain has not improved with Voltaren gel - Pain limits ability to participate in physical therapy  Imaging and structural findings - MRI reveals arthritis in the acromioclavicular [facility]) joint - Chronic tendinitis of the supraspinatus muscle - Subtle partial tears in the supraspinatus tendon - Evidence of nerve impingement affecting a rotator cuff muscle - Stable ossicle present from prior injury  Medication intolerance and limitations - History of gastric bypass surgery limits use of anti-inflammatory medications - Preference to avoid steroids due to prior episode of bleeding in the stool and stomach pain following steroid injection - Current pain management with Voltaren gel, applied as needed       Objective:   There were no vitals filed for this visit.  Const: appears well, non-toxic, well groomed Psych: affect bright, interactive, smiling EENT: EOMI intact, conjunctiva appear normal Neck: no obvious masses, appears symmetric Resp: non-labored, appears symmetric Neuro: muscle bulk appears normal Skin: no obvious rashes noted      Assessment & Plan:   Assessment & Plan Right shoulder rotator cuff partial tear with tendinosis and impingement syndrome   Chronic tendinosis and partial tears in the supraspinatus tendon are present, with associated impingement syndrome. MRI reveals partial tears in the tendon with intact surrounding fibers and an ossicle  from past bleeding and calcification. Peculiarly, a previous steroid injection in her shoulder caused gastrointestinal bleeding, so it is her preference to avoid these. Counseled patient that the risk of increased gastrointestinal bleeding associated with a corticosteroid injection in the subacromial space is extremely low and not considered clinically significant. Perform dextrose  prolotherapy injection in the supraspinatus tendon using ultrasound guidance. Encourage gentle range of motion exercises at home to maintain shoulder mobility. Avoid NSAIDs due to risk of gastrointestinal bleeding.    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 MRI indicates nerve impingement with muscle infiltration by fat deposits. Differential diagnosis includes quadrilateral space syndrome. Order a nerve conduction study to assess axillary nerve function and confirm entrapment.

## 2024-08-09 ENCOUNTER — Encounter: Admitting: Obstetrics and Gynecology

## 2024-08-10 ENCOUNTER — Other Ambulatory Visit: Payer: Self-pay | Admitting: Sports Medicine

## 2024-08-10 ENCOUNTER — Ambulatory Visit: Payer: Self-pay

## 2024-08-11 ENCOUNTER — Other Ambulatory Visit

## 2024-08-11 ENCOUNTER — Other Ambulatory Visit: Payer: Self-pay | Admitting: Family

## 2024-08-12 ENCOUNTER — Other Ambulatory Visit: Payer: Self-pay | Admitting: Medical Genetics

## 2024-08-12 DIAGNOSIS — Z006 Encounter for examination for normal comparison and control in clinical research program: Secondary | ICD-10-CM

## 2024-08-17 ENCOUNTER — Ambulatory Visit: Admitting: Family

## 2024-08-17 ENCOUNTER — Encounter: Payer: Self-pay | Admitting: Physical Medicine and Rehabilitation

## 2024-08-18 ENCOUNTER — Ambulatory Visit: Admitting: Family

## 2024-08-20 ENCOUNTER — Ambulatory Visit (INDEPENDENT_AMBULATORY_CARE_PROVIDER_SITE_OTHER)

## 2024-08-20 ENCOUNTER — Other Ambulatory Visit: Payer: Self-pay

## 2024-08-20 VITALS — BP 138/86 | Ht 62.0 in | Wt 239.0 lb

## 2024-08-20 DIAGNOSIS — M7531 Calcific tendinitis of right shoulder: Secondary | ICD-10-CM

## 2024-08-20 NOTE — Progress Notes (Signed)
   Subjective:    Patient ID: Camelia Borer, female    DOB: 48 y.o., 07/18/1976   MRN: [id number]  Chief Complaint: Right shoulder supraspinatus prolotherapy injection   Patient presenting for prolotherapy injection of right supraspinatus for calcific tendinopathy.     Objective:   Vitals:   08/20/24 1025  BP: 138/86   Subacromial Space Injection with Ultrasound Guidance Procedure Note Devin Foskey [birth year] Indications: Pain Procedure Details Verbal consent was obtained from the patient. Risks, benefits, and alternatives were explained. The supraspinatus tendon was identified on US . Patient prepped with Chloraprep and Ethyl Chloride used for anesthesia. The patient was then injected using a lateral approach with a solution of 12.5% hypertonic dextrose , mepivacaine 2%, and sodium HCO3 8.4%. The patient tolerated the procedure well and had decreased pain post injection. No complications. Please see applicable images in the medical record.     Assessment & Plan:   Assessment & Plan  Patient tolerated procedure well.  Follow-up to obtain nerve conduction study as currently scheduled in early December to assess for peculiar and currently unexplained atrophy of teres minor muscle on recent MRI concerning for quadrilateral space syndrome.

## 2024-08-23 ENCOUNTER — Ambulatory Visit: Admitting: "Endocrinology

## 2024-08-23 ENCOUNTER — Ambulatory Visit (INDEPENDENT_AMBULATORY_CARE_PROVIDER_SITE_OTHER): Admitting: Obstetrics and Gynecology

## 2024-08-23 ENCOUNTER — Encounter: Payer: Self-pay | Admitting: Obstetrics and Gynecology

## 2024-08-23 VITALS — BP 131/83 | HR 73 | Ht 62.0 in | Wt 235.0 lb

## 2024-08-23 DIAGNOSIS — Z09 Encounter for follow-up examination after completed treatment for conditions other than malignant neoplasm: Secondary | ICD-10-CM

## 2024-08-23 NOTE — Progress Notes (Signed)
 GYNECOLOGY OFFICE VISIT NOTE  History:  Elizabeth [name] is a 48 y.o. [id number] here today for postop check s/p TLH, BS, cystoscopy and TAP block on 05/10/24.   No postop complaints. Still being seen for GI issues as well as right shoulder issues.   Sexually active with current partner - knew him since HS.   The following portions of the patient's history were reviewed and updated as appropriate: allergies, current medications, past family history, past medical history, past social history, past surgical history and problem list.   Review of Systems:  Pertinent items noted in HPI and remainder of comprehensive ROS otherwise negative.  Physical Exam:  BP 131/83   Pulse 73   Ht 5' 2 (1.575 m)   Wt 235 lb (106.6 kg)   LMP 11/20/2023   BMI 42.98 kg/m  CONSTITUTIONAL: Well-developed, well-nourished female in no acute distress.  HEENT:  Normocephalic, atraumatic. External right and left ear normal. No scleral icterus.  NECK: Normal range of motion, supple, no masses noted on observation SKIN: No rash noted. Not diaphoretic. No erythema. No pallor. MUSCULOSKELETAL: Normal range of motion. No edema noted. NEUROLOGIC: Alert and oriented to person, place, and time. Normal muscle tone coordination. No cranial nerve deficit noted. PSYCHIATRIC: Normal mood and affect. Normal behavior. Normal judgment and thought content.  PELVIC: Cuff inspected and examined and well healed.   Labs and Imaging No results found. However, due to the size of the patient record, not all encounters were searched. Please check Results Review for a complete set of results.  MR SHOULDER RIGHT WO CONTRAST Result Date: 08/02/2024 MR SHOULDER WITHOUT IV CONTRAST COMPARISON: 02/03/2024 CLINICAL HISTORY: Right shoulder pain. PULSE SEQUENCES: Ax PD FS, Sag T2 FS, Cor T1 & COR T2 FS FINDINGS: Bones: Mild AC joint arthrosis is present with slight reactive edema in the [facility] joint. This is relatively stable. Correlation for [facility]  joint symptoms. No accelerated glenohumeral arthrosis is present. There is no fracture or contusion pattern. Rotator cuff: Mild insertional tendinosis is seen in the supra and infraspinatus tendons. Very subtle partial tears may be present. No significant partial or full-thickness tear is identified. There is no retraction. Subscapularis and teres minor tendons are intact. Mild fatty atrophy is stable in the teres minor muscle could be related to mild quadrilateral space syndrome. Clinical correlation. There is an ossicle adjacent to the lesser tuberosity anteriorly which could be a loose body or calcification in the inferior subscapularis tendon. This is slightly medial to the bicipital groove. Labrum and biceps tendon: The biceps tendon is unremarkable. Labrum demonstrates no significant abnormality. IMPRESSION: Mild AC joint arthrosis with slight hypertrophy and erosive changes in the distal clavicle. There is mild reactive edema. Correlation for [facility] joint symptoms. This is relatively stable. Mild insertional tendinosis of the distal supraspinatus and infraspinatus tendons. Very subtle partial tears may be present. No significant partial or full-thickness tear is identified. Stable ossicle adjacent to the lesser tuberosity slightly medial to the biceps tendon. There is mild fluid surrounding the ossicles best seen on coronal image 5 series 5. This could be related to calcific tendinitis or loose body. Suggestion of possible chronic quadrilateral space syndrome with fatty atrophy of the teres minor muscle. No acute interval change. Electronically signed by: Norleen Satchel MD 08/02/2024 12:28 PM EDT RP Workstation: [id number]    Assessment and Plan:  1. Postop check (Primary) No restrictions.  Discussed coming every couple years and as needed  No longer needs pap smears.    No  orders of the defined types were placed in this encounter.    Routine preventative health maintenance measures emphasized. Please  refer to After Visit Summary for other counseling recommendations.   Return in about 2 years (around 08/23/2026) for annual.  Vina Solian, MD, FACOG Obstetrician & Gynecologist, [facility] for [employer], [facility]

## 2024-08-24 ENCOUNTER — Other Ambulatory Visit: Payer: Self-pay | Admitting: Obstetrics and Gynecology

## 2024-08-24 ENCOUNTER — Encounter: Payer: Self-pay | Admitting: Obstetrics and Gynecology

## 2024-08-24 DIAGNOSIS — L680 Hirsutism: Secondary | ICD-10-CM

## 2024-08-24 MED ORDER — SPIRONOLACTONE 100 MG PO TABS: 100.0000 mg | ORAL_TABLET | Freq: Two times a day (BID) | ORAL | 1 refills | Status: AC

## 2024-08-24 MED ORDER — SPIRONOLACTONE 50 MG PO TABS: 50.0000 mg | ORAL_TABLET | Freq: Two times a day (BID) | ORAL | 2 refills | Status: DC

## 2024-08-24 NOTE — Addendum Note (Signed)
 Addended by: CLEATUS MOCCASIN A on: 08/24/2024 04:47 PM   Modules accepted: Orders

## 2024-09-02 ENCOUNTER — Other Ambulatory Visit: Payer: Self-pay | Admitting: "Endocrinology

## 2024-09-03 ENCOUNTER — Encounter: Payer: Self-pay | Admitting: Family

## 2024-09-06 ENCOUNTER — Other Ambulatory Visit: Payer: Self-pay

## 2024-09-06 ENCOUNTER — Ambulatory Visit

## 2024-09-06 VITALS — BP 138/90 | Ht 62.0 in | Wt 235.0 lb

## 2024-09-06 DIAGNOSIS — M67911 Unspecified disorder of synovium and tendon, right shoulder: Secondary | ICD-10-CM

## 2024-09-06 DIAGNOSIS — R238 Other skin changes: Secondary | ICD-10-CM

## 2024-09-06 DIAGNOSIS — M7531 Calcific tendinitis of right shoulder: Secondary | ICD-10-CM

## 2024-09-06 MED ORDER — METHYLPREDNISOLONE ACETATE 40 MG/ML IJ SUSP
40.0000 mg | Freq: Once | INTRAMUSCULAR | Status: AC
  Administered 2024-09-06: 10 mg via INTRA_ARTICULAR

## 2024-09-06 NOTE — Progress Notes (Signed)
   Subjective:    Patient ID: Elizabeth [name], female    DOB: 48 y.o., [birth year]   MRN: [id number]  Chief Complaint: Right shoulder supraspinatus (prolotherapy injection #2)   Patient presenting for prolotherapy injection #2 of right supraspinatus for calcific tendinopathy. Patient did note an area of prior skin breakdown following injection site of prior procedure. She put Band-Aid over the area, used antibiotic ointment, no other therapies. She reports similar issues with needle pokes in the past and points to several areas of sores on her skin      Objective:   Vitals:   09/06/24 1326  BP: (!) 138/90   Small area of skin breakdown without corresponding induration, erythema, drainage.  This area corresponds to the location of prior needle insertion for injection on 08/20/2024.  Subacromial Space Injection with Ultrasound Guidance Procedure Note Elizabeth [name] [birth year] Indications: Pain Procedure Details Verbal consent was obtained from the patient. Risks, benefits, and alternatives were explained. The supraspinatus tendon was identified on US . Patient prepped with Chloraprep and Ethyl Chloride used for anesthesia. The patient was then injected (in an area different than the aforementioned area of skin breakdown) using a lateral approach with a solution of 12.5% hypertonic dextrose , mepivacaine 2%, and sodium HCO3 8.4%. The patient tolerated the procedure well and had decreased pain post injection. No complications. Please see applicable images in the medical record.     Assessment & Plan:   Assessment & Plan  Patient tolerated procedure well.  Continue HEP.  Keep with plans to obtain nerve conduction study in early December for peculiar and currently unexplained atrophy of teres minor muscle on recent MRI concerning for quadrilateral space syndrome.

## 2024-09-07 LAB — GENECONNECT MOLECULAR SCREEN: Genetic Analysis Overall Interpretation: NEGATIVE

## 2024-09-08 ENCOUNTER — Other Ambulatory Visit: Payer: Self-pay | Admitting: "Endocrinology

## 2024-09-14 ENCOUNTER — Ambulatory Visit: Admitting: Family

## 2024-09-19 NOTE — Progress Notes (Unsigned)
   Subjective:    Patient ID: Elizabeth [name], female    DOB: 48 y.o., 06/18/1976   MRN: [id number]  Chief Complaint: Right shoulder supraspinatus (prolotherapy injection #3)   Patient presenting for prolotherapy injection #3 of right supraspinatus for calcific tendinopathy. Reports 50% improvement Patient did note an area of prior skin breakdown following injection site of prior procedure. She put Band-Aid over the area, used antibiotic ointment, no other therapies. She reports similar issues with needle pokes in the past and points to several areas of sores on her skin      Objective:   There were no vitals filed for this visit.   Right shoulder: Small area of skin breakdown without corresponding induration, erythema, drainage.  This area corresponds to the location of prior needle insertion for injection on 08/20/2024.  Right Subacromial Space Injection with Ultrasound Guidance Procedure Note Demetris Meinhardt 03-21-1976 Indications: Pain Procedure Details Verbal consent was obtained from the patient. Risks, benefits, and alternatives were explained. The supraspinatus tendon was identified on US . Patient prepped with Chloraprep and Ethyl Chloride used for anesthesia. The patient was then injected (in an area different than the aforementioned area of skin breakdown) using a lateral approach with a solution of 12.5% hypertonic dextrose , mepivacaine 2%, and sodium HCO3 8.4%. The patient tolerated the procedure well and had decreased pain post injection. No complications. Please see applicable images in the medical record.     Assessment & Plan:   Assessment & Plan  Patient tolerated procedure well.  Continue HEP.  Keep with plans to obtain nerve conduction study in early December for peculiar and currently unexplained atrophy of teres minor muscle on recent MRI concerning for quadrilateral space syndrome.

## 2024-09-20 ENCOUNTER — Other Ambulatory Visit: Payer: Self-pay

## 2024-09-20 ENCOUNTER — Ambulatory Visit

## 2024-09-20 VITALS — BP 134/88 | Ht 62.0 in | Wt 235.0 lb

## 2024-09-20 DIAGNOSIS — M7531 Calcific tendinitis of right shoulder: Secondary | ICD-10-CM

## 2024-09-20 DIAGNOSIS — M67911 Unspecified disorder of synovium and tendon, right shoulder: Secondary | ICD-10-CM

## 2024-09-20 MED ORDER — METHYLPREDNISOLONE ACETATE 40 MG/ML IJ SUSP
40.0000 mg | Freq: Once | INTRAMUSCULAR | Status: AC
  Administered 2024-09-20: 20 mg via INTRA_ARTICULAR

## 2024-09-21 ENCOUNTER — Ambulatory Visit: Admitting: Family

## 2024-10-01 ENCOUNTER — Ambulatory Visit: Admitting: Family

## 2024-10-01 VITALS — BP 122/77 | HR 83 | Temp 98.5°F | Ht 62.0 in | Wt 230.4 lb

## 2024-10-01 DIAGNOSIS — G4700 Insomnia, unspecified: Secondary | ICD-10-CM

## 2024-10-01 DIAGNOSIS — E559 Vitamin D deficiency, unspecified: Secondary | ICD-10-CM

## 2024-10-01 DIAGNOSIS — E118 Type 2 diabetes mellitus with unspecified complications: Secondary | ICD-10-CM

## 2024-10-01 DIAGNOSIS — F32A Depression, unspecified: Secondary | ICD-10-CM

## 2024-10-01 DIAGNOSIS — F419 Anxiety disorder, unspecified: Secondary | ICD-10-CM | POA: Insufficient documentation

## 2024-10-01 DIAGNOSIS — E785 Hyperlipidemia, unspecified: Secondary | ICD-10-CM

## 2024-10-01 DIAGNOSIS — K259 Gastric ulcer, unspecified as acute or chronic, without hemorrhage or perforation: Secondary | ICD-10-CM

## 2024-10-01 DIAGNOSIS — I10 Essential (primary) hypertension: Secondary | ICD-10-CM

## 2024-10-01 DIAGNOSIS — G8929 Other chronic pain: Secondary | ICD-10-CM

## 2024-10-01 MED ORDER — BUSPIRONE HCL 7.5 MG PO TABS: 7.5000 mg | ORAL_TABLET | Freq: Two times a day (BID) | ORAL | 2 refills | Status: AC

## 2024-10-01 MED ORDER — GABAPENTIN 400 MG PO CAPS: 400.0000 mg | ORAL_CAPSULE | Freq: Three times a day (TID) | ORAL | Status: AC

## 2024-10-01 MED ORDER — TRAZODONE HCL 50 MG PO TABS: 50.0000 mg | ORAL_TABLET | Freq: Every day | ORAL | 0 refills | Status: AC

## 2024-10-01 MED ORDER — ESCITALOPRAM OXALATE 20 MG PO TABS: 20.0000 mg | ORAL_TABLET | Freq: Every day | ORAL | 1 refills | Status: AC

## 2024-10-01 MED ORDER — LISINOPRIL 10 MG PO TABS: 10.0000 mg | ORAL_TABLET | Freq: Every day | ORAL | 1 refills | Status: AC

## 2024-10-01 NOTE — Assessment & Plan Note (Signed)
 This is being followed by GI at [facility].  She will start Movantik tomorrow and follow up with them in February.

## 2024-10-01 NOTE — Assessment & Plan Note (Signed)
 Uncontrolled despite lexapro  20mg . Will add buspar.

## 2024-10-01 NOTE — Patient Instructions (Signed)
°  VISIT SUMMARY: Today, we reviewed your diabetes management, chronic abdominal pain, right shoulder pain, anxiety and depression, hypertension, hyperlipidemia, insomnia, and vitamin D  levels. We made some adjustments to your medications and discussed further evaluations and follow-ups.  YOUR PLAN: -TYPE 2 DIABETES MELLITUS WITH HYPOGLYCEMIA: Your blood sugar levels are generally controlled, but you have experienced low blood sugar episodes, likely due to the increased dose of Trulicity . We have discontinued metformin  to help manage this.  -CHRONIC ABDOMINAL PAIN WITH CONSTIPATION AND VOMITING: You continue to have abdominal pain, constipation, and vomiting. A hiatal hernia was found, and you have started a new medication, Movantik, to help with these symptoms. Further evaluation with a GI specialist is scheduled for February.  -CHRONIC RIGHT SHOULDER PAIN WITH NEUROPATHY: You have ongoing shoulder pain with nerve issues. We have increased your gabapentin  dose and scheduled a nerve conduction study to further investigate.  -GENERALIZED ANXIETY AND DEPRESSION: Your anxiety has worsened, and hydroxyzine  is causing drowsiness. We have started you on Buspar and encouraged you to find a new therapist. You should continue taking Lexapro  and only use hydroxyzine  for severe anxiety.  -ESSENTIAL HYPERTENSION: Your blood pressure is well-controlled with your current medications, Aldactone  and lisinopril . Continue taking these as prescribed.  -HYPERLIPIDEMIA: Your LDL cholesterol is near the target level. Continue taking Crestor  20 mg daily to manage your cholesterol.  -INSOMNIA: You have been experiencing sleep disturbances. We will monitor your sleep quality and reassess after managing your anxiety.  -VITAMIN D  DEFICIENCY: Your vitamin D  levels were last checked almost a year ago. We have ordered a new test to check your current levels.  INSTRUCTIONS: Please follow up with the GI specialist in February  for further evaluation of your abdominal pain. Continue with your current medications and the new ones as discussed. We have ordered a vitamin D  level test, so please get this done at your earliest convenience. If you have any new or worsening symptoms, please contact our office.

## 2024-10-01 NOTE — Assessment & Plan Note (Signed)
 Lab Results  Component Value Date   CHOL 158 02/20/2024   HDL 51.60 02/20/2024   LDLCALC 84 02/20/2024   TRIG 112.0 02/20/2024   CHOLHDL 3 02/20/2024   LDL slightly above goal, update lipids today and continue crestor .

## 2024-10-01 NOTE — Assessment & Plan Note (Signed)
 This is being managed by [facility] and Pain management.

## 2024-10-01 NOTE — Assessment & Plan Note (Signed)
 BP Readings from Last 3 Encounters:  10/01/24 122/77  09/20/24 134/88  09/06/24 (!) 138/90   Stable on aldactone  and lisinopril .

## 2024-10-01 NOTE — Assessment & Plan Note (Addendum)
 Lab Results  Component Value Date   HGBA1C 7.6 (H) 02/20/2024   HGBA1C 9.0 (H) 09/26/2023   HGBA1C 8.7 (H) 05/28/2023   Lab Results  Component Value Date   MICROALBUR 18.1 03/26/2024   LDLCALC 84 02/20/2024   CREATININE 0.73 04/30/2024   Reports a few lows in the 50's/60's before dinner.  She is on farxiga , Trulicity  and self decreased metformin  from 1000 mg bid to 500mg  bid with continued episodes of hypoglycemia. She has not needed to use the sliding scale coverage. Will d/c metformin  and notify her Endocrinologist.

## 2024-10-01 NOTE — Assessment & Plan Note (Signed)
 She continues nexium  bid per GI.

## 2024-10-01 NOTE — Assessment & Plan Note (Signed)
 Stable on lexapro  20mg . Will add buspar bid for anxiety. Continue hydroxyzine  prn.

## 2024-10-01 NOTE — Progress Notes (Signed)
 Subjective:     Patient ID: Elizabeth [name], female    DOB: February 08, 1976, 48 y.o.   MRN: [id number]  Chief Complaint  Patient presents with   Follow-up   Diabetes   Hypertension   Hyperlipidemia    Follow up    Diabetes  Hypertension  Hyperlipidemia    Discussed the use of AI scribe software for clinical note transcription with the patient, who gave verbal consent to proceed.  History of Present Illness Elizabeth [name] is a 48 year old female with diabetes and chronic abdominal pain who presents for follow-up on her medications and symptoms.  Her blood sugar levels have been stable, with the highest reading being 125 mg/dL. She has experienced hypoglycemic episodes, with blood sugar dropping to the 50s, particularly before dinner. She previously reduced her metformin  dosage and has now stopped it due to these episodes. She is currently on Trulicity , which was increased recently, coinciding with the onset of hypoglycemia. She has not needed to use Humalog  recently.  She continues to experience chronic abdominal pain, which has not improved. She recently underwent an acid test at [facility] and is starting a new medication, Movantik, to address pain and bowel movement issues, as previous treatment with Linzess was ineffective. She had an endoscopy two months ago and is awaiting further evaluation. A hiatal hernia was identified during a recent procedure, which was difficult to perform. She experiences nausea and increased vomiting.  She has right shoulder pain and reports that a rare nerve issue was seen on her MRI. She has received three joint injections,for treatment. She is scheduled for a nerve conduction study. She takes gabapentin  400 mg three times daily for this condition.  She reports worsening anxiety and depression, with anxiety being more prominent. She is currently on hydroxyzine  as needed, which causes drowsiness, and Lexapro , which she finds effective for depression. Her  family has expressed concern about her mental health. She is considering switching therapists due to availability issues.  She is experiencing sleep disturbances, waking frequently throughout the night. She was previously sleeping well on trazodone . She is also taking Nexium  twice daily for gastrointestinal issues.  She is not currently working and is focusing on managing her health conditions.      Health Maintenance Due  Topic Date Due   COVID-19 Vaccine (4 - 2025-26 season) 06/21/2024   HEMOGLOBIN A1C  08/22/2024    Past Medical History:  Diagnosis Date   Anemia    Anxiety    Arthritis    Astigmatism    Depression    Diabetes mellitus with complication (HCC)    Diabetic retinopathy (HCC)    Dry mouth    Fatigue    GERD (gastroesophageal reflux disease)    Heartburn    Hepatic steatosis    Hepatomegaly    High cholesterol    Hypertension    Keratoconjunctivitis    Liver disease    Muscle pain    Nausea and vomiting 04/21/2023   Ovarian cyst    Positive ANA (antinuclear antibody)    Pulmonary nodules    Stomach ulcer    Vitamin D  deficiency    Weakness     Past Surgical History:  Procedure Laterality Date   BIOPSY  01/18/2021   Procedure: BIOPSY;  Surgeon: San Sandor GAILS, DO;  Location: [facility];  Service: Gastroenterology;;   BIOPSY  10/28/2023   Procedure: BIOPSY;  Surgeon: Lyndel Deward PARAS, MD;  Location: [facility];  Service: General;;   CHOLECYSTECTOMY N/A 05/11/2021  Procedure: LAPAROSCOPIC CHOLECYSTECTOMY WITH INTRAOPERATIVE CHOLANGIOGRAM;  Surgeon: Lyndel Deward PARAS, MD;  Location: [facility];  Service: General;  Laterality: N/A;   CYSTOSCOPY N/A 05/10/2024   Procedure: PHYLLIS;  Surgeon: Cleatus Moccasin, MD;  Location: [facility];  Service: Gynecology;  Laterality: N/A;   DILATION AND CURETTAGE OF UTERUS     ENDOMETRIAL BIOPSY  12/19/2017   normal per pt   ESOPHAGOGASTRODUODENOSCOPY N/A 10/28/2023   Procedure: UPPER ENDOSCOPY;  Surgeon:  Lyndel Deward PARAS, MD;  Location: [facility];  Service: General;  Laterality: N/A;   ESOPHAGOGASTRODUODENOSCOPY (EGD) WITH PROPOFOL  N/A 01/18/2021   Procedure: ESOPHAGOGASTRODUODENOSCOPY (EGD) WITH PROPOFOL ;  Surgeon: San Sandor GAILS, DO;  Location: [facility];  Service: Gastroenterology;  Laterality: N/A;   HIATAL HERNIA REPAIR N/A 09/30/2022   Procedure: HERNIA REPAIR HIATAL;  Surgeon: Lyndel Deward PARAS, MD;  Location: [facility];  Service: General;  Laterality: N/A;   LAPAROSCOPIC INSERTION GASTROSTOMY TUBE N/A 12/02/2022   Procedure: LAPAROSCOPIC REMNANT GASTROSTOMY G TUBE;  Surgeon: Lyndel Deward PARAS, MD;  Location: [facility];  Service: General;  Laterality: N/A;   LAPAROSCOPIC ROUX-EN-Y GASTRIC BYPASS WITH HIATAL HERNIA REPAIR  09/30/2022   LAPAROSCOPY N/A 12/02/2022   Procedure: LAPAROSCOPY DIAGNOSTIC;  Surgeon: Lyndel Deward PARAS, MD;  Location: [facility];  Service: General;  Laterality: N/A;   TONSILLECTOMY     TOTAL LAPAROSCOPIC HYSTERECTOMY WITH SALPINGECTOMY Bilateral 05/10/2024   Procedure: HYSTERECTOMY, TOTAL, LAPAROSCOPIC, WITH SALPINGECTOMY;  Surgeon: Cleatus Moccasin, MD;  Location: [facility] OR;  Service: Gynecology;  Laterality: Bilateral;  W/ TAP Block by Dr. Cleatus   TUBAL LIGATION     UPPER GI ENDOSCOPY N/A 09/30/2022   Procedure: UPPER GI ENDOSCOPY;  Surgeon: Lyndel Deward PARAS, MD;  Location: [facility];  Service: General;  Laterality: N/A;   UPPER GI ENDOSCOPY N/A 12/02/2022   Procedure: UPPER ENDOSCOPY;  Surgeon: Lyndel Deward PARAS, MD;  Location: [facility];  Service: General;  Laterality: N/A;    Family History  Problem Relation Age of Onset   Diabetes Mother    Hypertension Mother    Hyperlipidemia Mother    Obesity Mother    Peripheral Artery Disease Mother    HIV Father        died from complications (IVDU)   Alcohol abuse Father    Drug abuse Father    Lupus Maternal Aunt    Ovarian cancer Maternal Grandmother    Colon cancer Neg Hx    Esophageal cancer Neg Hx     Liver disease Neg Hx    Pancreatic cancer Neg Hx    Stomach cancer Neg Hx    Sleep apnea Neg Hx    Rectal cancer Neg Hx     Social History   Socioeconomic History   Marital status: Widowed    Spouse name: Francis   Number of children: 2   Years of education: Not on file   Highest education level: Associate degree: occupational, scientist, product/process development, or vocational program  Occupational History   Occupation: CMA - Geophysicist/field Seismologist: [employer]  Tobacco Use   Smoking status: Former    Current packs/day: 0.00    Average packs/day: 0.5 packs/day for 3.0 years (1.5 ttl pk-yrs)    Types: Cigarettes    Start date: 08/21/2018    Quit date: 08/21/2021    Years since quitting: 3.1    Passive exposure: Never   Smokeless tobacco: Never   Tobacco comments:    4-5/day  Vaping Use   Vaping status: Never Used  Substance and Sexual Activity   Alcohol use: No   Drug use: No   Sexual activity: Yes    Partners: Male    Birth control/protection: Surgical, Implant  Other Topics Concern   Not on file  Social History Narrative    2 children   67- son Harden   78- son Designer, Jewellery in [place] [facility]- CMA in [place]   Enjoys reading   Widowed 2024/07/16, husband died from COVID-19.   Social Drivers of Health   Tobacco Use: Medium Risk (09/20/2024)   Patient History    Smoking Tobacco Use: Former    Smokeless Tobacco Use: Never    Passive Exposure: Never  Physicist, Medical Strain: Medium Risk (09/24/2024)   Overall Financial Resource Strain (CARDIA)    Difficulty of Paying Living Expenses: Somewhat hard  Food Insecurity: Food Insecurity Present (09/24/2024)   Epic    Worried About Programme Researcher, Broadcasting/film/video in the Last Year: Sometimes true    Ran Out of Food in the Last Year: Sometimes true  Transportation Needs: No Transportation Needs (09/24/2024)   Epic    Lack of Transportation (Medical): No    Lack of Transportation (Non-Medical): No  Physical Activity: Inactive  (09/24/2024)   Exercise Vital Sign    Days of Exercise per Week: 0 days    Minutes of Exercise per Session: Not on file  Stress: Stress Concern Present (09/24/2024)   [employer] of Occupational Health - Occupational Stress Questionnaire    Feeling of Stress: Rather much  Social Connections: Socially Isolated (09/24/2024)   Social Connection and Isolation Panel    Frequency of Communication with Friends and Family: Once a week    Frequency of Social Gatherings with Friends and Family: Never    Attends Religious Services: More than 4 times per year    Active Member of [employer] or Organizations: No    Attends Banker Meetings: Not on file    Marital Status: Widowed  Intimate Partner Violence: Not At Risk (12/02/2022)   Humiliation, Afraid, Rape, and Kick questionnaire    Fear of Current or Ex-Partner: No    Emotionally Abused: No    Physically Abused: No    Sexually Abused: No  Depression (PHQ2-9): High Risk (10/01/2024)   Depression (PHQ2-9)    PHQ-2 Score: 14  Alcohol Screen: Not on file  Housing: Unknown (09/24/2024)   Epic    Unable to Pay for Housing in the Last Year: No    Number of Times Moved in the Last Year: Not on file    Homeless in the Last Year: No  Utilities: Low Risk (04/13/2024)   Received from [facility]   Utilities    In the past 12 months has the electric, gas, oil, or water  company threatened to shut off services in your home? : No  Health Literacy: Not on file    Outpatient Medications Prior to Visit  Medication Sig Dispense Refill   ACCU-CHEK GUIDE TEST test strip TEST MORNING, NOON, AND AT BEDTIME 250 each 11   Accu-Chek Softclix Lancets lancets 4 (four) times daily. (Patient taking differently: daily.)     acetaminophen  (TYLENOL ) 500 MG tablet Take 500-1,000 mg by mouth every 6 (six) hours as needed (pain.).     Blood Glucose Monitoring Suppl (ACCU-CHEK GUIDE) w/Device KIT USE MORNING, NOON, AND AT BEDTIME AS DIRECTED 1 kit 11   Blood  Glucose Monitoring Suppl DEVI 1 each by Does not apply route in the morning, at noon,  and at bedtime. May substitute to any manufacturer covered by patient's insurance. (Patient taking differently: 1 each by Does not apply route daily. May substitute to any manufacturer covered by patient's insurance.) 1 each 0   Calcium  Citrate-Vitamin D  (CALCIUM  CITRATE CHEWY BITE PO) Take 1 tablet by mouth at bedtime.     dapagliflozin  propanediol (FARXIGA ) 10 MG TABS tablet TAKE 1 TABLET BY MOUTH EVERY DAY BEFORE BREAKFAST 30 tablet 2   esomeprazole  (NEXIUM ) 40 MG capsule Take 1 capsule (40 mg total) by mouth 2 (two) times daily. Open capsule and sprinkle on food at breakfast and dinner for better absorption. 180 capsule 3   famotidine  (PEPCID ) 20 MG tablet Take 1 tablet (20 mg total) by mouth 2 (two) times daily. 180 tablet 1   hydrOXYzine  (VISTARIL ) 25 MG capsule Take 1 capsule (25 mg total) by mouth every 8 (eight) hours as needed for anxiety. 30 capsule 0   Lancets Misc. (ACCU-CHEK SOFTCLIX LANCET DEV) KIT USE MORNING, NOON, AND AT BEDTIME 1 kit 11   methocarbamol  (ROBAXIN ) 500 MG tablet Take 1 tablet (500 mg total) by mouth every 8 (eight) hours as needed for muscle spasms. 90 tablet 0   Multiple Vitamins-Minerals (BARIATRIC FUSION PO) Take 1 tablet by mouth in the morning.     naloxone (NARCAN) nasal spray 4 mg/0.1 mL Place 1 spray into the nose as needed.     ondansetron  (ZOFRAN -ODT) 4 MG disintegrating tablet PLACE 1 TABLET ON TONGUE AND ALLOW TO DISSOLVE EVERY 8 HOURS AS NEEDED FOR NAUSEA OR VOMITING 90 tablet 3   rosuvastatin  (CRESTOR ) 20 MG tablet Take 1 tablet (20 mg total) by mouth daily. 90 tablet 3   spironolactone  (ALDACTONE ) 100 MG tablet Take 1 tablet (100 mg total) by mouth 2 (two) times daily. 60 tablet 1   sucralfate  (CARAFATE ) 1 GM/10ML suspension Take 1 g by mouth in the morning, at noon, in the evening, and at bedtime.     TRULICITY  3 MG/0.5ML SOAJ INJECT 3MG  SUBCUTANEOUSLY AS DIRECTED ONCE  WEEKLY 6 mL 11   escitalopram  (LEXAPRO ) 20 MG tablet Take 1 tablet (20 mg total) by mouth daily. 90 tablet 1   lisinopril  (ZESTRIL ) 10 MG tablet TAKE 1 TABLET BY MOUTH DAILY 90 tablet 1   metFORMIN  (GLUCOPHAGE -XR) 500 MG 24 hr tablet TAKE 2 TABLETS BY MOUTH TWICE DAILY WITH MEALS 360 tablet 11   traZODone  (DESYREL ) 50 MG tablet Take 1 tablet (50 mg total) by mouth at bedtime. 90 tablet 0   insulin  lispro (HUMALOG ) 100 UNIT/ML KwikPen INJECT 3-7 UNITS INTO THE SKIN THREE TIMES A DAY. 3U IF BLOOD SUGAR IS MORE THAN 200, 5U IF BLOOD SUGAR IS MORE THAN 250, 7U IF BLOOD SUGAR IS MORE THAN 300. (Patient not taking: Reported on 08/23/2024) 15 mL 11   albuterol  (VENTOLIN  HFA) 108 (90 Base) MCG/ACT inhaler Inhale 1-2 puffs into the lungs every 6 (six) hours as needed for wheezing or shortness of breath. (Patient not taking: Reported on 08/23/2024) 1 each 0   bisacodyl  (DULCOLAX) 10 MG suppository Place 1 suppository (10 mg total) rectally daily as needed for moderate constipation. (Patient not taking: Reported on 08/23/2024) 12 suppository 0   gabapentin  (NEURONTIN ) 300 MG capsule Take 1 capsule (300 mg total) by mouth 3 (three) times daily. 270 capsule 1   senna (SENOKOT) 8.6 MG TABS tablet Take 2 tablets (17.2 mg total) by mouth at bedtime as needed for mild constipation. 30 tablet 1   No facility-administered medications prior to visit.  Allergies[1]  ROS    See HPI Objective:    Physical Exam Constitutional:      General: She is not in acute distress.    Appearance: Normal appearance. She is well-developed.  HENT:     Head: Normocephalic and atraumatic.     Right Ear: External ear normal.     Left Ear: External ear normal.  Eyes:     General: No scleral icterus. Neck:     Thyroid : No thyromegaly.  Cardiovascular:     Rate and Rhythm: Normal rate and regular rhythm.     Heart sounds: Normal heart sounds. No murmur heard. Pulmonary:     Effort: Pulmonary effort is normal. No respiratory  distress.     Breath sounds: Normal breath sounds. No wheezing.  Musculoskeletal:     Cervical back: Neck supple.  Skin:    General: Skin is warm and dry.  Neurological:     Mental Status: She is alert and oriented to person, place, and time.  Psychiatric:        Mood and Affect: Mood normal.        Behavior: Behavior normal.        Thought Content: Thought content normal.        Judgment: Judgment normal.      BP 122/77 (BP Location: Left Arm, Patient Position: Sitting, Cuff Size: Large)   Pulse 83   Temp 98.5 F (36.9 C) (Oral)   Ht 5' 2 (1.575 m)   Wt 230 lb 6.4 oz (104.5 kg)   LMP 11/20/2023   SpO2 100%   BMI 42.14 kg/m  Wt Readings from Last 3 Encounters:  10/01/24 230 lb 6.4 oz (104.5 kg)  09/20/24 235 lb (106.6 kg)  09/06/24 235 lb (106.6 kg)       Assessment & Plan:   Problem List Items Addressed This Visit       Unprioritized   Vitamin D  deficiency   Relevant Orders   Vitamin D  (25 hydroxy)   Multiple gastric ulcers   She continues nexium  bid per GI.        Insomnia   Relevant Medications   traZODone  (DESYREL ) 50 MG tablet   Hyperlipidemia   Lab Results  Component Value Date   CHOL 158 02/20/2024   HDL 51.60 02/20/2024   LDLCALC 84 02/20/2024   TRIG 112.0 02/20/2024   CHOLHDL 3 02/20/2024   LDL slightly above goal, update lipids today and continue crestor .      Relevant Medications   lisinopril  (ZESTRIL ) 10 MG tablet   Other Relevant Orders   Lipid panel   Essential hypertension   BP Readings from Last 3 Encounters:  10/01/24 122/77  09/20/24 134/88  09/06/24 (!) 138/90   Stable on aldactone  and lisinopril .       Relevant Medications   lisinopril  (ZESTRIL ) 10 MG tablet   Diabetes mellitus with complication (HCC) - Primary   Lab Results  Component Value Date   HGBA1C 7.6 (H) 02/20/2024   HGBA1C 9.0 (H) 09/26/2023   HGBA1C 8.7 (H) 05/28/2023   Lab Results  Component Value Date   MICROALBUR 18.1 03/26/2024   LDLCALC 84  02/20/2024   CREATININE 0.73 04/30/2024   Reports a few lows in the 50's/60's before dinner.  She is on farxiga , Trulicity  and self decreased metformin  from 1000 mg bid to 500mg  bid with continued episodes of hypoglycemia. She has not needed to use the sliding scale coverage. Will d/c metformin  and notify her Endocrinologist.  Relevant Medications   lisinopril  (ZESTRIL ) 10 MG tablet   Other Relevant Orders   HgB A1c   Comp Met (CMET)   Urine Microalbumin w/creat. ratio   Depression   Stable on lexapro  20mg . Will add buspar bid for anxiety. Continue hydroxyzine  prn.       Relevant Medications   busPIRone (BUSPAR) 7.5 MG tablet   escitalopram  (LEXAPRO ) 20 MG tablet   traZODone  (DESYREL ) 50 MG tablet   Chronic right shoulder pain   This is being managed by [facility] and Pain management.      Relevant Medications   escitalopram  (LEXAPRO ) 20 MG tablet   traZODone  (DESYREL ) 50 MG tablet   gabapentin  (NEURONTIN ) 400 MG capsule   Chronic abdominal pain   This is being followed by GI at [facility].  She will start Movantik tomorrow and follow up with them in February.      Relevant Medications   escitalopram  (LEXAPRO ) 20 MG tablet   traZODone  (DESYREL ) 50 MG tablet   gabapentin  (NEURONTIN ) 400 MG capsule   Anxiety   Uncontrolled despite lexapro  20mg . Will add buspar.       Relevant Medications   busPIRone (BUSPAR) 7.5 MG tablet   escitalopram  (LEXAPRO ) 20 MG tablet   traZODone  (DESYREL ) 50 MG tablet    I have discontinued Kieu Tineo's gabapentin , albuterol , senna, bisacodyl , and metFORMIN . I have also changed her lisinopril . Additionally, I am having her start on busPIRone and gabapentin . Lastly, I am having her maintain her esomeprazole , Blood Glucose Monitoring Suppl, Accu-Chek Softclix Lancets, naloxone, Accu-Chek Softclix Lancet Dev, rosuvastatin , famotidine , hydrOXYzine , Multiple Vitamins-Minerals (BARIATRIC FUSION PO), Calcium  Citrate-Vitamin D  (CALCIUM  CITRATE  CHEWY BITE PO), acetaminophen , sucralfate , Accu-Chek Guide, Accu-Chek Guide Test, insulin  lispro, methocarbamol , ondansetron , spironolactone , Farxiga , Trulicity , escitalopram , and traZODone .  Meds ordered this encounter  Medications   busPIRone (BUSPAR) 7.5 MG tablet    Sig: Take 1 tablet (7.5 mg total) by mouth 2 (two) times daily.    Dispense:  60 tablet    Refill:  2    Supervising Provider:   DOMENICA BLACKBIRD A [[id number]]   escitalopram  (LEXAPRO ) 20 MG tablet    Sig: Take 1 tablet (20 mg total) by mouth daily.    Dispense:  90 tablet    Refill:  1    Supervising Provider:   DOMENICA BLACKBIRD A [[id number]]   traZODone  (DESYREL ) 50 MG tablet    Sig: Take 1 tablet (50 mg total) by mouth at bedtime.    Dispense:  90 tablet    Refill:  0    Supervising Provider:   DOMENICA BLACKBIRD A [[id number]]   gabapentin  (NEURONTIN ) 400 MG capsule    Sig: Take 1 capsule (400 mg total) by mouth 3 (three) times daily.    Supervising Provider:   DOMENICA BLACKBIRD A [[id number]]   lisinopril  (ZESTRIL ) 10 MG tablet    Sig: Take 1 tablet (10 mg total) by mouth daily.    Dispense:  90 tablet    Refill:  1    Supervising Provider:   DOMENICA BLACKBIRD A [[id number]]      [1]  Allergies Allergen Reactions   Nsaids     Pt reported due to Bariatric surgery 09/2022   Tomato Hives, Itching and Rash

## 2024-10-02 ENCOUNTER — Encounter: Payer: Self-pay | Admitting: Obstetrics and Gynecology

## 2024-10-02 LAB — HEMOGLOBIN A1C
Hgb A1c MFr Bld: 6.1 % — ABNORMAL HIGH (ref <5.7)
Mean Plasma Glucose: 128 mg/dL
eAG (mmol/L): 7.1 mmol/L

## 2024-10-02 LAB — COMPREHENSIVE METABOLIC PANEL WITH GFR
AG Ratio: 1.4 (calc) (ref 1.0–2.5)
ALT: 31 U/L — ABNORMAL HIGH (ref 6–29)
AST: 30 U/L (ref 10–35)
Albumin: 4.1 g/dL (ref 3.6–5.1)
Alkaline phosphatase (APISO): 96 U/L (ref 31–125)
BUN: 10 mg/dL (ref 7–25)
CO2: 25 mmol/L (ref 20–32)
Calcium: 9.4 mg/dL (ref 8.6–10.2)
Chloride: 105 mmol/L (ref 98–110)
Creat: 0.77 mg/dL (ref 0.50–0.99)
Globulin: 3 g/dL (ref 1.9–3.7)
Glucose, Bld: 97 mg/dL (ref 65–99)
Potassium: 4.5 mmol/L (ref 3.5–5.3)
Sodium: 143 mmol/L (ref 135–146)
Total Bilirubin: 0.2 mg/dL (ref 0.2–1.2)
Total Protein: 7.1 g/dL (ref 6.1–8.1)
eGFR: 96 mL/min/1.73m2 (ref >=60)

## 2024-10-02 LAB — MICROALBUMIN / CREATININE URINE RATIO
Creatinine, Urine: 211 mg/dL (ref 20–275)
Microalb Creat Ratio: 4 mg/g{creat} (ref <30)
Microalb, Ur: 0.9 mg/dL

## 2024-10-02 LAB — LIPID PANEL
Cholesterol: 130 mg/dL (ref <200)
HDL: 60 mg/dL (ref >=50)
LDL Cholesterol (Calc): 54 mg/dL
Non-HDL Cholesterol (Calc): 70 mg/dL (ref <130)
Total CHOL/HDL Ratio: 2.2 (calc) (ref <5.0)
Triglycerides: 82 mg/dL (ref <150)

## 2024-10-02 LAB — VITAMIN D 25 HYDROXY (VIT D DEFICIENCY, FRACTURES): Vit D, 25-Hydroxy: 43 ng/mL (ref 30–100)

## 2024-10-03 ENCOUNTER — Encounter: Payer: Self-pay | Admitting: Family

## 2024-10-03 DIAGNOSIS — R21 Rash and other nonspecific skin eruption: Secondary | ICD-10-CM

## 2024-10-04 ENCOUNTER — Ambulatory Visit: Admitting: "Endocrinology

## 2024-10-04 ENCOUNTER — Encounter: Payer: Self-pay | Admitting: Physical Medicine and Rehabilitation

## 2024-10-04 ENCOUNTER — Encounter: Attending: Physical Medicine and Rehabilitation | Admitting: Physical Medicine and Rehabilitation

## 2024-10-04 ENCOUNTER — Ambulatory Visit: Payer: Self-pay | Admitting: Family

## 2024-10-04 VITALS — BP 125/82 | HR 79 | Ht 62.0 in

## 2024-10-04 DIAGNOSIS — G5690 Unspecified mononeuropathy of unspecified upper limb: Secondary | ICD-10-CM

## 2024-10-04 NOTE — Progress Notes (Signed)
 See media folder for full report in 1-2 weeks. Findings of chronic R axillary neuropathy on needle EMG in the deltoid and teres minor muscles.    Joesph JAYSON Likes, DO 10/04/2024

## 2024-10-05 ENCOUNTER — Encounter: Admitting: Family

## 2024-10-08 ENCOUNTER — Telehealth (INDEPENDENT_AMBULATORY_CARE_PROVIDER_SITE_OTHER): Admitting: "Endocrinology

## 2024-10-08 ENCOUNTER — Encounter: Payer: Self-pay | Admitting: "Endocrinology

## 2024-10-08 VITALS — Ht 62.0 in | Wt 229.0 lb

## 2024-10-08 DIAGNOSIS — Z7984 Long term (current) use of oral hypoglycemic drugs: Secondary | ICD-10-CM | POA: Diagnosis not present

## 2024-10-08 DIAGNOSIS — E119 Type 2 diabetes mellitus without complications: Secondary | ICD-10-CM | POA: Diagnosis not present

## 2024-10-08 DIAGNOSIS — Z7985 Long-term (current) use of injectable non-insulin antidiabetic drugs: Secondary | ICD-10-CM

## 2024-10-08 DIAGNOSIS — E7800 Pure hypercholesterolemia, unspecified: Secondary | ICD-10-CM | POA: Diagnosis not present

## 2024-10-08 DIAGNOSIS — E669 Obesity, unspecified: Secondary | ICD-10-CM | POA: Diagnosis not present

## 2024-10-08 NOTE — Patient Instructions (Signed)

## 2024-10-08 NOTE — Progress Notes (Addendum)
 "  The patient reports they are currently: [place]. I spent 6 minutes on the video with the patient on the date of service. I spent an additional 5 minutes on pre- and post-visit activities on the date of service.   The patient was physically located in [place]  or a state in which I am permitted to provide care. The patient and/or parent/guardian understood that s/he may incur co-pays and cost sharing, and agreed to the telemedicine visit. The visit was reasonable and appropriate under the circumstances given the patient's presentation at the time.  The patient and/or parent/guardian has been advised of the potential risks and limitations of this mode of treatment (including, but not limited to, the absence of in-person examination) and has agreed to be treated using telemedicine. The patient's/patient's family's questions regarding telemedicine have been answered.   The patient and/or parent/guardian has also been advised to contact their provider's office for worsening conditions, and seek emergency medical treatment and/or call 911 if the patient deems either necessary.    Outpatient Endocrinology Note Elizabeth Birmingham, MD  10/08/2024   Elizabeth [name] 12/29/1975 [id number]  Referring Provider: Daryl Setter, NP Primary Care Provider: Daryl Setter, NP Reason for consultation: Subjective   Assessment & Plan  Diagnoses and all orders for this visit:  Type 2 diabetes mellitus in patient with obesity (HCC)  Long term (current) use of oral hypoglycemic drugs  Long-term (current) use of injectable non-insulin  antidiabetic drugs  Pure hypercholesterolemia    Diabetes Type II complicated by hyperglycemia  History of RYGB Lab Results  Component Value Date   GFR 91.87 08/25/2023   Hba1c goal less than 7, current Hba1c is  Lab Results  Component Value Date   HGBA1C 6.1 (H) 10/01/2024   Will recommend the following: Farxiga  10 mg every day Trulicity  3 mg/week    10/08/24: Patient had self reduced her metformin  XR 500 mg 2 pills bid dose to 500 mg twice a day due to low blood sugars.  Despite that she reported blood sugars as low as 50s to 60s and her PCP in 09/2024 completely stopped the metformin .  Since then she is reporting her blood sugars in 90s.  Stopped  Januvia  100 mg every day Not needing PRN Humalog : 3 units if blood sugar is more than 200, 5 units if blood sugar is more than 250, 7 units if blood sugar is more than 300 Ordered libre  Xigduo /libre/DexCom is not covered  Trulicity  was stopped due to pt needing feeding tube to nausea/vomiting after RYGB in 09/2022  No known contraindications/side effects to any of above medications  -Last LD and Tg are as follows: Lab Results  Component Value Date   LDLCALC 54 10/01/2024    Lab Results  Component Value Date   TRIG 82 10/01/2024   -On rosuvatstain 5 mg every day: LDL 84 -04/09/24: increase to rosuvatstain 20 mg every day -Repeat labs and CMP to watch for AST/ALT -Follow low fat diet and exercise   -Blood pressure goal <140/90 - Microalbumin/creatinine goal is < 30 -Last MA/Cr is as follows: Lab Results  Component Value Date   MICROALBUR 0.9 10/01/2024   -on ACE/ARB lisinopril  5 mg every day  -diet changes including salt restriction -limit eating outside -counseled BP targets per standards of diabetes care -uncontrolled blood pressure can lead to retinopathy, nephropathy and cardiovascular and atherosclerotic heart disease  Reviewed and counseled on: -A1C target -Blood sugar targets -Complications of uncontrolled diabetes  -Checking blood sugar before meals and bedtime  and bring log next visit -All medications with mechanism of action and side effects -Hypoglycemia management: rule of 15's, Glucagon Emergency Kit and medical alert ID -low-carb low-fat plate-method diet -At least 20 minutes of physical activity per day -Annual dilated retinal eye exam and foot  exam -compliance and follow up needs -follow up as scheduled or earlier if problem gets worse  Call if blood sugar is less than 70 or consistently above 250    Take a 15 gm snack of carbohydrate at bedtime before you go to sleep if your blood sugar is less than 100.    If you are going to fast after midnight for a test or procedure, ask your physician for instructions on how to reduce/decrease your insulin  dose.    Call if blood sugar is less than 70 or consistently above 250  -Treating a low sugar by rule of 15  (15 gms of sugar every 15 min until sugar is more than 70) If you feel your sugar is low, test your sugar to be sure If your sugar is low (less than 70), then take 15 grams of a fast acting Carbohydrate (3-4 glucose tablets or glucose gel or 4 ounces of juice or regular soda) Recheck your sugar 15 min after treating low to make sure it is more than 70 If sugar is still less than 70, treat again with 15 grams of carbohydrate          Don't drive the hour of hypoglycemia  If unconscious/unable to eat or drink by mouth, use glucagon injection or nasal spray baqsimi and call 911. Can repeat again in 15 min if still unconscious.  Return in about 4 months (around 02/06/2025).   I have reviewed current medications, nurse's notes, allergies, vital signs, past medical and surgical history, family medical history, and social history for this encounter. Counseled patient on symptoms, examination findings, lab findings, imaging results, treatment decisions and monitoring and prognosis. The patient understood the recommendations and agrees with the treatment plan. All questions regarding treatment plan were fully answered.  Elizabeth Birmingham, MD  10/08/2024    History of Present Illness Elizabeth [name] is a 48 y.o. year old female who presents for follow up of Type II diabetes mellitus.  Elizabeth [name] was first diagnosed in 2009.   Diabetes education +  Home diabetes regimen: Farxiga  10  mg every day Trulicity  3 mg/wk   Stopped Xigduo  2.02/999 mg every day, pt wants tor resume again, was discontinued after RYGB, xigduo  not covered now  Stopped Januvia  100 mg every day  Xigduo  is not covered, DexCom is not covered Trulicity  was stopped due to pt needing feeding tube to nausea/vomiting after RYGB in 09/2022 Did well with xigduo  bid in past  COMPLICATIONS -  MI/Stroke -  retinopathy -  neuropathy -  nephropathy  SYMPTOMS REVIEWED + Weight loss, has RYGB 09/30/22; pre-surgery 316 lb  BLOOD SUGAR DATA Checks once a day Range before topped metformin : 50s-60s lowest Range after stopped metformin : 90s  Physical Exam  Ht 5' 2 (1.575 m)   Wt 229 lb (103.9 kg)   LMP 11/20/2023   BMI 41.88 kg/m    Constitutional: well developed, well nourished Head: normocephalic, atraumatic Eyes: sclera anicteric, no redness Neck: supple Lungs: normal respiratory effort Neurology: alert and oriented Skin: dry, no appreciable rashes Musculoskeletal: no appreciable defects Psychiatric: normal mood and affect Diabetic Foot Exam - Simple   No data filed      Current Medications Patient's Medications  New Prescriptions   No medications on file  Previous Medications   ACCU-CHEK GUIDE TEST TEST STRIP    TEST MORNING, NOON, AND AT BEDTIME   ACCU-CHEK SOFTCLIX LANCETS LANCETS    4 (four) times daily.   ACETAMINOPHEN  (TYLENOL ) 500 MG TABLET    Take 500-1,000 mg by mouth every 6 (six) hours as needed (pain.).   BLOOD GLUCOSE MONITORING SUPPL (ACCU-CHEK GUIDE) W/DEVICE KIT    USE MORNING, NOON, AND AT BEDTIME AS DIRECTED   BUSPIRONE  (BUSPAR ) 7.5 MG TABLET    Take 1 tablet (7.5 mg total) by mouth 2 (two) times daily.   CALCIUM  CITRATE-VITAMIN D  (CALCIUM  CITRATE CHEWY BITE PO)    Take 1 tablet by mouth at bedtime.   DAPAGLIFLOZIN  PROPANEDIOL (FARXIGA ) 10 MG TABS TABLET    TAKE 1 TABLET BY MOUTH EVERY DAY BEFORE BREAKFAST   ESCITALOPRAM  (LEXAPRO ) 20 MG TABLET    Take 1 tablet (20 mg  total) by mouth daily.   ESOMEPRAZOLE  (NEXIUM ) 40 MG CAPSULE    Take 1 capsule (40 mg total) by mouth 2 (two) times daily. Open capsule and sprinkle on food at breakfast and dinner for better absorption.   FAMOTIDINE  (PEPCID ) 20 MG TABLET    Take 1 tablet (20 mg total) by mouth 2 (two) times daily.   GABAPENTIN  (NEURONTIN ) 400 MG CAPSULE    Take 1 capsule (400 mg total) by mouth 3 (three) times daily.   HYDROXYZINE  (VISTARIL ) 25 MG CAPSULE    Take 1 capsule (25 mg total) by mouth every 8 (eight) hours as needed for anxiety.   INSULIN  LISPRO (HUMALOG ) 100 UNIT/ML KWIKPEN    INJECT 3-7 UNITS INTO THE SKIN THREE TIMES A DAY. 3U IF BLOOD SUGAR IS MORE THAN 200, 5U IF BLOOD SUGAR IS MORE THAN 250, 7U IF BLOOD SUGAR IS MORE THAN 300.   LANCETS MISC. (ACCU-CHEK SOFTCLIX LANCET DEV) KIT    USE MORNING, NOON, AND AT BEDTIME   LISINOPRIL  (ZESTRIL ) 10 MG TABLET    Take 1 tablet (10 mg total) by mouth daily.   METHOCARBAMOL  (ROBAXIN ) 500 MG TABLET    Take 1 tablet (500 mg total) by mouth every 8 (eight) hours as needed for muscle spasms.   MULTIPLE VITAMINS-MINERALS (BARIATRIC FUSION PO)    Take 1 tablet by mouth in the morning.   NALOXONE (NARCAN) NASAL SPRAY 4 MG/0.1 ML    Place 1 spray into the nose as needed.   ONDANSETRON  (ZOFRAN -ODT) 4 MG DISINTEGRATING TABLET    PLACE 1 TABLET ON TONGUE AND ALLOW TO DISSOLVE EVERY 8 HOURS AS NEEDED FOR NAUSEA OR VOMITING   ROSUVASTATIN  (CRESTOR ) 20 MG TABLET    Take 1 tablet (20 mg total) by mouth daily.   SPIRONOLACTONE  (ALDACTONE ) 100 MG TABLET    Take 1 tablet (100 mg total) by mouth 2 (two) times daily.   SUCRALFATE  (CARAFATE ) 1 GM/10ML SUSPENSION    Take 1 g by mouth in the morning, at noon, in the evening, and at bedtime.   TRAZODONE  (DESYREL ) 50 MG TABLET    Take 1 tablet (50 mg total) by mouth at bedtime.   TRULICITY  3 MG/0.5ML SOAJ    INJECT 3MG  SUBCUTANEOUSLY AS DIRECTED ONCE WEEKLY  Modified Medications   No medications on file  Discontinued Medications    No medications on file    Allergies Allergies  Allergen Reactions   Nsaids     Pt reported due to Bariatric surgery 09/2022   Tomato Hives, Itching and Rash  Past Medical History Past Medical History:  Diagnosis Date   Anemia    Anxiety    Arthritis    Astigmatism    Axillary neuropathy 10/04/2024   Depression    Diabetes mellitus with complication (HCC)    Diabetic retinopathy (HCC)    Dry mouth    Fatigue    GERD (gastroesophageal reflux disease)    Heartburn    Hepatic steatosis    Hepatomegaly    High cholesterol    Hypertension    Keratoconjunctivitis    Liver disease    Muscle pain    Nausea and vomiting 04/21/2023   Ovarian cyst    Positive ANA (antinuclear antibody)    Pulmonary nodules    Stomach ulcer    Vitamin D  deficiency    Weakness     Past Surgical History Past Surgical History:  Procedure Laterality Date   BIOPSY  01/18/2021   Procedure: BIOPSY;  Surgeon: San Sandor GAILS, DO;  Location: [facility];  Service: Gastroenterology;;   BIOPSY  10/28/2023   Procedure: BIOPSY;  Surgeon: Lyndel Deward PARAS, MD;  Location: [facility];  Service: General;;   CHOLECYSTECTOMY N/A 05/11/2021   Procedure: LAPAROSCOPIC CHOLECYSTECTOMY WITH INTRAOPERATIVE CHOLANGIOGRAM;  Surgeon: Lyndel Deward PARAS, MD;  Location: [facility];  Service: General;  Laterality: N/A;   CYSTOSCOPY N/A 05/10/2024   Procedure: PHYLLIS;  Surgeon: Cleatus Moccasin, MD;  Location[facility];  Service: Gynecology;  Laterality: N/A;   DILATION AND CURETTAGE OF UTERUS     ENDOMETRIAL BIOPSY  12/19/2017   normal per pt   ESOPHAGOGASTRODUODENOSCOPY N/A 10/28/2023   Procedure: UPPER ENDOSCOPY;  Surgeon: Lyndel Deward PARAS, MD;  Location: [facility];  Service: General;  Laterality: N/A;   ESOPHAGOGASTRODUODENOSCOPY (EGD) WITH PROPOFOL  N/A 01/18/2021   Procedure: ESOPHAGOGASTRODUODENOSCOPY (EGD) WITH PROPOFOL ;  Surgeon: San Sandor GAILS, DO;  Location: [facility];  Service:  Gastroenterology;  Laterality: N/A;   HIATAL HERNIA REPAIR N/A 09/30/2022   Procedure: HERNIA REPAIR HIATAL;  Surgeon: Lyndel Deward PARAS, MD;  Location: [facility];  Service: General;  Laterality: N/A;   LAPAROSCOPIC INSERTION GASTROSTOMY TUBE N/A 12/02/2022   Procedure: LAPAROSCOPIC REMNANT GASTROSTOMY G TUBE;  Surgeon: Lyndel Deward PARAS, MD;  Location: [facility];  Service: General;  Laterality: N/A;   LAPAROSCOPIC ROUX-EN-Y GASTRIC BYPASS WITH HIATAL HERNIA REPAIR  09/30/2022   LAPAROSCOPY N/A 12/02/2022   Procedure: LAPAROSCOPY DIAGNOSTIC;  Surgeon: Lyndel Deward PARAS, MD;  Location: [facility];  Service: General;  Laterality: N/A;   TONSILLECTOMY     TOTAL LAPAROSCOPIC HYSTERECTOMY WITH SALPINGECTOMY Bilateral 05/10/2024   Procedure: HYSTERECTOMY, TOTAL, LAPAROSCOPIC, WITH SALPINGECTOMY;  Surgeon: Cleatus Moccasin, MD;  Location: [facility] OR;  Service: Gynecology;  Laterality: Bilateral;  W/ TAP Block by Dr. Cleatus   TUBAL LIGATION     UPPER GI ENDOSCOPY N/A 09/30/2022   Procedure: UPPER GI ENDOSCOPY;  Surgeon: Lyndel Deward PARAS, MD;  Location: [facility];  Service: General;  Laterality: N/A;   UPPER GI ENDOSCOPY N/A 12/02/2022   Procedure: UPPER ENDOSCOPY;  Surgeon: Lyndel Deward PARAS, MD;  Location: [facility];  Service: General;  Laterality: N/A;    Family History family history includes Alcohol abuse in her father; Diabetes in her mother; Drug abuse in her father; HIV in her father; Hyperlipidemia in her mother; Hypertension in her mother; Lupus in her maternal aunt; Obesity in her mother; Ovarian cancer in her maternal grandmother; Peripheral Artery Disease in her mother.  Social History Social History   Socioeconomic History   Marital status:  Widowed    Spouse name: Francis   Number of children: 2   Years of education: Not on file   Highest education level: Associate degree: occupational, scientist, product/process development, or vocational program  Occupational History   Occupation: CMA - Geophysicist/field Seismologist:  [employer]  Tobacco Use   Smoking status: Former    Current packs/day: 0.00    Average packs/day: 0.5 packs/day for 3.0 years (1.5 ttl pk-yrs)    Types: Cigarettes    Start date: 08/21/2018    Quit date: 08/21/2021    Years since quitting: 3.1    Passive exposure: Never   Smokeless tobacco: Never   Tobacco comments:    4-5/day  Vaping Use   Vaping status: Never Used  Substance and Sexual Activity   Alcohol use: No   Drug use: No   Sexual activity: Yes    Partners: Male    Birth control/protection: Surgical, Implant  Other Topics Concern   Not on file  Social History Narrative    2 children   1997- son Harden   2000- son Designer, Jewellery in [place] [facility]- CMA in [place]   Enjoys reading   Widowed July 27, 2024, husband died from COVID-19.   Social Drivers of Health   Tobacco Use: Medium Risk (10/08/2024)   Patient History    Smoking Tobacco Use: Former    Smokeless Tobacco Use: Never    Passive Exposure: Never  Physicist, Medical Strain: Medium Risk (09/24/2024)   Overall Financial Resource Strain (CARDIA)    Difficulty of Paying Living Expenses: Somewhat hard  Food Insecurity: Food Insecurity Present (09/24/2024)   Epic    Worried About Programme Researcher, Broadcasting/film/video in the Last Year: Sometimes true    Ran Out of Food in the Last Year: Sometimes true  Transportation Needs: No Transportation Needs (09/24/2024)   Epic    Lack of Transportation (Medical): No    Lack of Transportation (Non-Medical): No  Physical Activity: Inactive (09/24/2024)   Exercise Vital Sign    Days of Exercise per Week: 0 days    Minutes of Exercise per Session: Not on file  Stress: Stress Concern Present (09/24/2024)   [employer] of Occupational Health - Occupational Stress Questionnaire    Feeling of Stress: Rather much  Social Connections: Socially Isolated (09/24/2024)   Social Connection and Isolation Panel    Frequency of Communication with Friends and Family: Once a week    Frequency of  Social Gatherings with Friends and Family: Never    Attends Religious Services: More than 4 times per year    Active Member of [employer] or Organizations: No    Attends Banker Meetings: Not on file    Marital Status: Widowed  Intimate Partner Violence: Not At Risk (12/02/2022)   Humiliation, Afraid, Rape, and Kick questionnaire    Fear of Current or Ex-Partner: No    Emotionally Abused: No    Physically Abused: No    Sexually Abused: No  Depression (PHQ2-9): Low Risk (10/04/2024)   Depression (PHQ2-9)    PHQ-2 Score: 2  Recent Concern: Depression (PHQ2-9) - High Risk (10/01/2024)   Depression (PHQ2-9)    PHQ-2 Score: 14  Alcohol Screen: Not on file  Housing: Unknown (09/24/2024)   Epic    Unable to Pay for Housing in the Last Year: No    Number of Times Moved in the Last Year: Not on file    Homeless in the Last Year: No  Utilities: Low Risk (  04/13/2024)   Received from [facility]   Utilities    In the past 12 months has the electric, gas, oil, or water  company threatened to shut off services in your home? : No  Health Literacy: Not on file    Lab Results  Component Value Date   HGBA1C 6.1 (H) 10/01/2024   HGBA1C 7.6 (H) 02/20/2024   HGBA1C 9.0 (H) 09/26/2023   Lab Results  Component Value Date   CHOL 130 10/01/2024   Lab Results  Component Value Date   HDL 60 10/01/2024   Lab Results  Component Value Date   LDLCALC 54 10/01/2024   Lab Results  Component Value Date   TRIG 82 10/01/2024   Lab Results  Component Value Date   CHOLHDL 2.2 10/01/2024   Lab Results  Component Value Date   CREATININE 0.77 10/01/2024   Lab Results  Component Value Date   GFR 91.87 08/25/2023   Lab Results  Component Value Date   MICROALBUR 0.9 10/01/2024      Component Value Date/Time   NA 143 10/01/2024 1446   NA 137 02/27/2022 0000   K 4.5 10/01/2024 1446   CL 105 10/01/2024 1446   CO2 25 10/01/2024 1446   GLUCOSE 97 10/01/2024 1446   BUN 10 10/01/2024  1446   BUN 7 12/22/2018 1430   CREATININE 0.77 10/01/2024 1446   CALCIUM  9.4 10/01/2024 1446   PROT 7.1 10/01/2024 1446   PROT 6.8 12/22/2018 1430   ALBUMIN 3.3 (L) 04/30/2024 1040   ALBUMIN 4.3 12/22/2018 1430   AST 30 10/01/2024 1446   ALT 31 (H) 10/01/2024 1446   ALKPHOS 49 04/30/2024 1040   BILITOT 0.2 10/01/2024 1446   BILITOT 0.3 12/22/2018 1430   GFRNONAA >60 04/30/2024 1040   GFRAA 133 12/22/2018 1430      Latest Ref Rng & Units 10/01/2024    2:46 PM 04/30/2024   10:40 AM 04/17/2024    7:46 PM  BMP  Glucose 65 - 99 mg/dL 97  893  891   BUN 7 - 25 mg/dL 10  12  13    Creatinine 0.50 - 0.99 mg/dL 9.22  9.26  9.05   BUN/Creat Ratio 6 - 22 (calc) SEE NOTE:     Sodium 135 - 146 mmol/L 143  139  138   Potassium 3.5 - 5.3 mmol/L 4.5  3.8  4.3   Chloride 98 - 110 mmol/L 105  105  107   CO2 20 - 32 mmol/L 25  24  21    Calcium  8.6 - 10.2 mg/dL 9.4  8.9  9.4        Component Value Date/Time   WBC 6.0 04/30/2024 1040   RBC 4.66 04/30/2024 1040   HGB 11.1 (L) 04/30/2024 1040   HGB 13.3 12/22/2018 1430   HCT 35.9 (L) 04/30/2024 1040   HCT 43.4 12/22/2018 1430   PLT 239 04/30/2024 1040   PLT 301 12/22/2018 1430   MCV 77.0 (L) 04/30/2024 1040   MCV 73 (L) 12/22/2018 1430   MCH 23.8 (L) 04/30/2024 1040   MCHC 30.9 04/30/2024 1040   RDW 14.5 04/30/2024 1040   RDW 15.9 (H) 12/22/2018 1430   LYMPHSABS 2.5 04/17/2024 1946   LYMPHSABS 2.1 12/22/2018 1430   MONOABS 0.4 04/17/2024 1946   EOSABS 0.1 04/17/2024 1946   EOSABS 0.1 12/22/2018 1430   BASOSABS 0.1 04/17/2024 1946   BASOSABS 0.1 12/22/2018 1430     Parts of this note may have been dictated  using voice recognition software. There may be variances in spelling and vocabulary which are unintentional. Not all errors are proofread. Please notify the dino if any discrepancies are noted or if the meaning of any statement is not clear.   "

## 2024-10-18 ENCOUNTER — Encounter

## 2024-10-18 NOTE — Progress Notes (Unsigned)
 Erroneous encounter due to cancellation or no-show. Patient was not seen.

## 2024-10-19 ENCOUNTER — Telehealth: Payer: Self-pay | Admitting: *Deleted

## 2024-10-19 NOTE — Telephone Encounter (Signed)
 Returned call from 10/18/2024 at 2:48 PM. Left patient a message to call the office to schedule.

## 2024-10-20 NOTE — Progress Notes (Signed)
 " Test Date:  10/04/2024  Patient: Elizabeth [name] DOB: 10-13-1976 Physician: Dr. Joesph Likes  Sex: Female Height: 5' 2 Ref Phys: Dr. Redell Elizabeth [name]  ID#: [id number] Weight: 231 lbs. Technician:    Patient Complaints: Right shoulder pain for 1 year with arm weakness with raising her arm; shooting pain down to her wrist and pinky and up to her neck. Has pain wrapping around her left shoulder as well but not as severe.    Patient History / Exam: 3 injections with sports medicine, PT with mild benefit MRI R shoulder with ?axillary impingement RUE 4/5 EF, 3/5 SA; otherwise 5/5 b/l Sensation intact  NCV & EMG Findings: Evaluation of the left axillary motor and the right axillary motor nerves showed prolonged distal onset latency (L10.7, R12.5 ms).  The right median (APB) motor nerve showed prolonged distal onset latency (4.8 ms) and reduced amplitude (4.4 mV).  The right ulnar (ADM) motor nerve showed decreased conduction velocity (Bel Elbow-Wrist, 53 m/s).  All remaining nerves (as indicated in the following tables) were within normal limits.    Needle evaluation of the right deltoid muscle showed increased insertional activity, slightly increased spontaneous activity, increased motor unit amplitude, increased motor unit duration, and moderately increased polyphasic potentials.  The right teres minor muscle showed increased insertional activity, increased motor unit amplitude, and slightly increased polyphasic potentials.  All remaining muscles (as indicated in the following table) showed no evidence of electrical instability.    Impression:  This is an abnormal study. There is evidence of:  1) Chronic right axillary neuropathy. While NCS is nondiagnostic given similar changes seen on the left (study may be confounded by body habitus), changes in EMG are consistent with this finding.   There is no evidence of right cervical radiculopathy or large fiber peripheral neuropathy.    Recommendations: Follow up with ordering physician Dr. Joesph Likes    Nerve Conduction Studies Motor Nerve Results    Latency Amplitude F-Lat Segment Distance CV Comment  Site (ms) Norm (mV) Norm (ms)  (cm) (m/s) Norm   Right Median (APB)  Wrist *4.8  < 4.2 *4.4  > 5.0 25.4       Elbow 7.5 - 4.3 -  Elbow-Wrist 17 63  > 50   Right Ulnar (ADM)  Wrist 2.6  < 4.2 7.9  > 3.0 27.7       Bel Elbow 6.4 - 8.2 -  Bel Elbow-Wrist 20 *53  > 53   Abv Elbow 8.3 - 7.9 -  Abv Elbow-Bel Elbow 10 53  > 52   Left Axillary (Deltoid)  Erb's Pt. *10.7  < 5.0 0.55 -        Right Axillary (Deltoid)  Erb's Pt. *12.5  < 5.0 0.73 -         Sensory Nerve Results    Latency (Peak) Amplitude (P-P) Segment Distance CV Comment  Site (ms) Norm (V) Norm  (cm) (m/s) Norm   Right Median  Wrist-Dig II 3.3  < 3.6 54  > 10 Wrist-Dig II 14 42  > 39   Right Ulnar  Wrist-Dig V 3.0  < 3.7 45  > 15 Wrist-Dig V 14 47  > 38   Right Radial  Forearm-Wrist 1.85  < 3.1 54 - Forearm-Wrist 10 54 -     Electromyography   Side Muscle Nerve Root Ins Act Fibs Psw Amp Dur Poly Recrt Int Bruna Comment  Right FDI Ulnar C8-T1 Nml Nml Nml Nml Nml 0 Nml Nml  Right APB Median C8-T1 Nml Nml Nml Nml Nml 0 Nml Nml   Right FCR Median C6-C7 Nml Nml Nml Nml Nml 0 Nml Nml   Right EIP Post Interosseous,  R... C7-C8 Nml Nml Nml Nml Nml 0 Nml Nml   Right Triceps Radial C6-C8 Nml Nml Nml Nml Nml 0 Nml Nml   Right Brachiorad Radial C5-C6 Nml Nml Nml Nml Nml 0 Nml Nml   Right Biceps Musculocut C5-C6 Nml Nml Nml Nml Nml 0 Nml Nml   Right Deltoid Axillary C5-C6 *Incr Nml *1+ *Incr *>49ms *2+ Nml Nml   Right Teres Minor Axillary C5-C6 *Incr Nml Nml *Incr Nml *1+ Nml Nml      NCS Waveforms:  Motor           F-Wave      Sensory           "

## 2024-10-26 ENCOUNTER — Ambulatory Visit (INDEPENDENT_AMBULATORY_CARE_PROVIDER_SITE_OTHER)

## 2024-10-26 VITALS — BP 118/88 | Ht 62.0 in | Wt 229.0 lb

## 2024-10-26 DIAGNOSIS — M67911 Unspecified disorder of synovium and tendon, right shoulder: Secondary | ICD-10-CM

## 2024-10-26 DIAGNOSIS — G540 Brachial plexus disorders: Secondary | ICD-10-CM | POA: Diagnosis not present

## 2024-10-26 DIAGNOSIS — M7711 Lateral epicondylitis, right elbow: Secondary | ICD-10-CM | POA: Diagnosis present

## 2024-10-26 DIAGNOSIS — M7531 Calcific tendinitis of right shoulder: Secondary | ICD-10-CM

## 2024-10-26 NOTE — Progress Notes (Signed)
 "  Subjective:    Patient ID: Elizabeth [name], female    DOB: 49 y.o., 10/08/1976   MRN: [id number]  Chief Complaint: Right quadrilateral space syndrome, right calcific tendinopathy of supraspinatus, right elbow pain  Discussed the use of AI scribe software for clinical note transcription with the patient, who gave verbal consent to proceed.  History of Present Illness Elizabeth [name] is a 49 year old female with quadrilateral space syndrome and rotator cuff tendinopathy who presents for evaluation of worsening right upper extremity pain.  Right shoulder pain and dysfunction - Chronic right shoulder pain, previously anterior and responsive to injections, now primarily over the superior shoulder with radiation posteriorly toward the scapula and distally to the elbow. - Pain is nearly constant and severe enough to disrupt sleep. - Pain is sometimes shooting in nature. - Tenderness present in the posterior shoulder. - Sharp pain with certain movements. - MRI demonstrated small partial-thickness tears and chronic tendinopathy of the supraspinatus and infraspinatus. - No formal physical therapy due to pain, but gentle home exercises performed as tolerated.  Right upper extremity neurological symptoms - Numbness and paresthesia in the right arm, mainly at the elbow and occasionally involving the fifth digit. - Sensations described as numb and prickly, without involvement of other fingers. - Involuntary middle finger movement when the elbow region is palpated. - Numbness present about 75% of the time. - Prior nerve conduction study showed axillary nerve involvement. - MRI findings include teres minor and axillary nerve abnormalities.  Right elbow pain and lateral epicondylitis - Right elbow pain, possibly due to compensatory use. - Tenderness at the elbow. - Sharp pain with certain movements. - Interest in wrist bracing for support, but has not yet tried a wrist brace.  Response to prior  treatments and medication tolerance - Previously avoided steroid injections due to concern for gastrointestinal side effects. - Tolerated a small dose of steroid injection without clear gastrointestinal issues. - Open to additional interventions, with a gastroenterology visit upcoming.  Objective:   Vitals:   10/26/24 1017  BP: 118/88    Right shoulder: 5 -/5 external rotation.  5/5 internal rotation.  5 -/5 empty can  Right elbow: TTP at lateral epicondyle.  Positive Cozen.  Positive Maudsley.     Assessment & Plan:   Assessment & Plan Quadrilateral space syndrome, right upper limb Chronic axillary nerve impingement causes persistent posterior and superior right shoulder pain radiating to the arm, significantly disturbing sleep. Clinical findings and imaging confirm axillary nerve involvement and teres minor muscle changes. Prior interventions have not provided lasting relief. A focused physical examination assessed axillary nerve involvement and muscle strength. Prior nerve conduction study and MRI were reviewed. A targeted anesthetic injection around the axillary nerve is planned to relieve impingement and improve shoulder pain. A conditional plan for further intervention, including possible steroid injection or repeat MRI, was discussed if symptoms persist.  Rotator cuff tendinopathy with partial tear, right shoulder Chronic rotator cuff tendinopathy with small partial tears of the supraspinatus and infraspinatus is confirmed on MRI. Pain is persistent and refractory to prior treatments, with only partial relief from a previous supraspinatus injection. Ongoing pain is likely driven by rotator cuff pathology. MRI findings showing chronic tendinopathy and partial tears were reviewed. Possible future interventions, including shockwave therapy and steroid injection, were discussed if pain persists. Continuation of gentle home exercises for the shoulder, using pain as a guide for activity  progression, was advised. Referral to physical therapy for formal rehabilitation targeting shoulder  function is planned.  Lateral epicondylitis, right elbow New onset lateral elbow pain is likely secondary to compensatory overuse from shoulder dysfunction. Examination and symptoms are consistent with lateral epicondylitis. A physical examination confirmed lateral epicondylitis. Treatment options, including physical therapy and use of a wrist brace to limit muscle and tendon use and prevent progression, were discussed. Education on wrist brace use was provided, with fitting or purchase information offered. Referral to physical therapy for targeted rehabilitation of the right elbow is planned.   "

## 2024-10-27 ENCOUNTER — Encounter: Payer: Self-pay | Admitting: Family

## 2024-10-28 ENCOUNTER — Ambulatory Visit (INDEPENDENT_AMBULATORY_CARE_PROVIDER_SITE_OTHER)

## 2024-10-28 ENCOUNTER — Other Ambulatory Visit: Payer: Self-pay

## 2024-10-28 VITALS — BP 130/72 | Ht 62.0 in | Wt 229.0 lb

## 2024-10-28 DIAGNOSIS — G540 Brachial plexus disorders: Secondary | ICD-10-CM

## 2024-10-28 NOTE — Progress Notes (Signed)
" ° °  Subjective:    Patient ID: Elizabeth [name], female    DOB: 49 y.o., [birth year]   MRN: [id number]  Chief Complaint: Quadrilateral space syndrome - axillary nerve hydrodissection  History of Present Illness Elizabeth [name] is a pleasant 49 year old female with nerve conduction study and MRI confirmed findings of axillary nerve compression at the quadrilateral space consistent with quadrilateral space syndrome.  She has had pain in her right shoulder that is refractory to prolotherapy injections, and diligent physical therapy.  She has a strong aversion to corticosteroid injections due to a prior reaction she had leading to a GI bleed.  She is here today for an axillary nerve Hydro dissection at the level of the quadrilateral space.     Objective:   Vitals:   10/28/24 1324  BP: 130/72   Right Axillary Nerve Hydrodissection Injection at the Quadrilateral Space with Ultrasound Guidance Procedure Note Elizabeth [name] 01/01/1976 Indications: Pain Procedure Details Following the description of risks including infection, bleeding, and damage to surrounding structures patient provided written consent for right axillary nerve hydrodissection injection with ultrasound guidance. Ultrasound was used to identify the right axillary nerve at the quadrilateral space. The patient was then prepped in the usual fashion with chlorhexidine . Following topical anesthetization with ethyl chloride, the patient was injected into the right axillary nerve sheath with a solution of 5cc of 5% dextrose  in sterile water . This was well visualized under ultrasound, please see associated photographic documentation. Patient tolerated well without complication. Precautions provided. Cleaned and dressing applied.     Assessment & Plan:   Assessment & Plan Elizabeth [name] tolerated the procedure today well.  We will follow-up in 2 weeks to reassess.  Consider repeat injection at that time if there is limited relief.   "

## 2024-11-02 ENCOUNTER — Encounter: Payer: Self-pay | Admitting: Internal Medicine

## 2024-11-02 ENCOUNTER — Ambulatory Visit: Admitting: Internal Medicine

## 2024-11-02 VITALS — BP 100/72 | HR 86 | Temp 98.5°F | Resp 20 | Ht 62.0 in | Wt 228.6 lb

## 2024-11-02 DIAGNOSIS — R109 Unspecified abdominal pain: Secondary | ICD-10-CM | POA: Diagnosis not present

## 2024-11-02 DIAGNOSIS — G8929 Other chronic pain: Secondary | ICD-10-CM | POA: Diagnosis not present

## 2024-11-02 DIAGNOSIS — Z87891 Personal history of nicotine dependence: Secondary | ICD-10-CM | POA: Diagnosis not present

## 2024-11-02 DIAGNOSIS — L988 Other specified disorders of the skin and subcutaneous tissue: Secondary | ICD-10-CM | POA: Diagnosis not present

## 2024-11-02 DIAGNOSIS — L500 Allergic urticaria: Secondary | ICD-10-CM | POA: Diagnosis not present

## 2024-11-02 DIAGNOSIS — Z9884 Bariatric surgery status: Secondary | ICD-10-CM

## 2024-11-02 MED ORDER — TRIAMCINOLONE ACETONIDE 0.1 % EX OINT: TOPICAL_OINTMENT | CUTANEOUS | 1 refills | Status: AC

## 2024-11-02 NOTE — Patient Instructions (Signed)
 Concern for Bechet's Disease  Symptoms and criteria align with Behet's disease diagnosis. Differential includes prurigo nodularis, but presentation supports pathergy.  Reviewed symptoms at length and she does endorse recurrent oral and genital ulcers that are consistent with pictures shown from up-to-date of oral and genital ulcers and Bechet's.  Skin lesions consistent with pathergy is only triggered from IVs or injections (skin trauma) - Referred to rheumatology for evaluation. - Instructed to document new oral and genital ulcers with photographs. - Advised to avoid skin trauma.  Skin lesions (possible prurigo nodularis) Lesions occur post-trauma, suggesting pathergy. Topical steroids may be ineffective; consider phototherapy or biologics.  - Prescribed triamcinolone  0.1% ointment twice daily as needed on lesions, avoiding groin, armpit, or face. - Referred to dermatology for further evaluation and potential phototherapy or biologic treatment. - Advised to avoid skin trauma. - Continue gentle skin care  Food sensitivity evaluation Postprandial symptoms with history of gastritis and ulcer. Food allergy testing deferred due to potential skin reactions. - Deferred food allergy testing until after dermatology and rheumatology evaluations. - Advised to avoid antihistamines for three days prior to future allergy testing. - Continue to avoid tomato in all forms   Follow up: after derm and rheumatology evaluation

## 2024-11-02 NOTE — Progress Notes (Unsigned)
 "  NEW PATIENT Date of Service/Encounter:  11/03/2024 Referring provider: Daryl Setter, NP Primary care provider: Daryl Setter, NP  Subjective:  Elizabeth [name] is a 49 y.o. female  presenting today for evaluation of concern for allergic reactions, chronic abdominal pain  History obtained from: chart review and patient.   Discussed the use of AI scribe software for clinical note transcription with the patient, who gave verbal consent to proceed.  History of Present Illness Elizabeth [name] is a 49 year old female who presents with food sensitivity and injection reactions. She was referred by her GI specialist for evaluation of food sensitivity and by sports medicine for reactions to injections.  Gastrointestinal symptoms - Persistent stomach pain, nausea, and vomiting immediately after eating since bariatric surgery in December 2023 - Diagnosed with gastritis and stomach ulcer by GI - Multiple diagnostic tests (CT scans, EGD) without identification of surgical complications - Severe pain described as contraction-like sensation across the top of the stomach - Symptoms have led to inability to work and ongoing use of pain management services for the past year - No specific food triggers identified, occurs within minutes of eating   Cutaneous reactions to injections - Nodular lesions develop on arms following injections or IV placements - Lesions are sometimes pruritic, but pruritus occurs AFTER skin puncture, not prior - No specific treatments used; applies topical antibiotic ointment and petroleum jelly  Food allergy and sensitivity - Long-standing allergy to tomatoes causing small bumps on the skin - No current use of antihistamines - Diet consists primarily of seafood, vegetables, and some fruits; avoids dairy, red meat, and tomatoes; limited intake of sweets due to abdominal pain  Mucocutaneous ulcers - Intermittent oral and genital ulcers, described as painful and  similar to those seen in Behet's disease - Genital ulcers occur one at a time - No current mouth ulcers - Reviewed pictures of Bechets oral and gential ulcers form uptodate with patient and she stated they look very similar to her ulcers  - Husband is deceased, no concern for risk of STDs   Other relevant history - No history of tick bites   Chart Review:  07/02/24 EGD Impression The esophageal mucosa appeared normal. Anatomy of RYGB surgery with no ulcers seen at the G-J anastomosis. The gastric mucosa appeared normal.Biopsies done from the gastric pouch to r/o Hpylori The SB was normal for 15 cm   CT ABD: IMPRESSION: 1. Hepatic steatosis. 2. Evidence of prior cholecystectomy. 3. Sigmoid diverticulosis. 4. Aortic atherosclerosis.   Other allergy screening: Asthma: no Rhino conjunctivitis: no Food allergy: yes Medication allergy: no Hymenoptera allergy: no Urticaria: no Eczema:rash as above  History of recurrent infections suggestive of immunodeficency: no Vaccinations are up to date.   Past Medical History: Past Medical History:  Diagnosis Date   Anemia    Anxiety    Arthritis    Astigmatism    Axillary neuropathy 10/04/2024   Depression    Diabetes mellitus with complication (HCC)    Diabetic retinopathy (HCC)    Dry mouth    Fatigue    GERD (gastroesophageal reflux disease)    Heartburn    Hepatic steatosis    Hepatomegaly    High cholesterol    Hypertension    Keratoconjunctivitis    Liver disease    Muscle pain    Nausea and vomiting 04/21/2023   Ovarian cyst    Positive ANA (antinuclear antibody)    Pulmonary nodules    Stomach ulcer    Vitamin D   deficiency    Weakness    Medication List:  Current Outpatient Medications  Medication Sig Dispense Refill   ACCU-CHEK GUIDE TEST test strip TEST MORNING, NOON, AND AT BEDTIME 250 each 11   Accu-Chek Softclix Lancets lancets 4 (four) times daily.     acetaminophen  (TYLENOL ) 500 MG tablet Take  500-1,000 mg by mouth every 6 (six) hours as needed (pain.).     Blood Glucose Monitoring Suppl (ACCU-CHEK GUIDE) w/Device KIT USE MORNING, NOON, AND AT BEDTIME AS DIRECTED 1 kit 11   busPIRone  (BUSPAR ) 7.5 MG tablet Take 1 tablet (7.5 mg total) by mouth 2 (two) times daily. 60 tablet 2   Calcium  Citrate-Vitamin D  (CALCIUM  CITRATE CHEWY BITE PO) Take 1 tablet by mouth at bedtime.     dapagliflozin  propanediol (FARXIGA ) 10 MG TABS tablet TAKE 1 TABLET BY MOUTH EVERY DAY BEFORE BREAKFAST 30 tablet 2   escitalopram  (LEXAPRO ) 20 MG tablet Take 1 tablet (20 mg total) by mouth daily. 90 tablet 1   esomeprazole  (NEXIUM ) 40 MG capsule Take 1 capsule (40 mg total) by mouth 2 (two) times daily. Open capsule and sprinkle on food at breakfast and dinner for better absorption. 180 capsule 3   famotidine  (PEPCID ) 20 MG tablet Take 1 tablet (20 mg total) by mouth 2 (two) times daily. 180 tablet 1   gabapentin  (NEURONTIN ) 400 MG capsule Take 1 capsule (400 mg total) by mouth 3 (three) times daily.     hydrOXYzine  (VISTARIL ) 25 MG capsule Take 1 capsule (25 mg total) by mouth every 8 (eight) hours as needed for anxiety. 30 capsule 0   Lancets Misc. (ACCU-CHEK SOFTCLIX LANCET DEV) KIT USE MORNING, NOON, AND AT BEDTIME 1 kit 11   lisinopril  (ZESTRIL ) 10 MG tablet Take 1 tablet (10 mg total) by mouth daily. 90 tablet 1   methocarbamol  (ROBAXIN ) 500 MG tablet Take 1 tablet (500 mg total) by mouth every 8 (eight) hours as needed for muscle spasms. 90 tablet 0   Multiple Vitamins-Minerals (BARIATRIC FUSION PO) Take 1 tablet by mouth in the morning.     naloxone (NARCAN) nasal spray 4 mg/0.1 mL Place 1 spray into the nose as needed.     ondansetron  (ZOFRAN -ODT) 4 MG disintegrating tablet PLACE 1 TABLET ON TONGUE AND ALLOW TO DISSOLVE EVERY 8 HOURS AS NEEDED FOR NAUSEA OR VOMITING 90 tablet 3   rosuvastatin  (CRESTOR ) 20 MG tablet Take 1 tablet (20 mg total) by mouth daily. 90 tablet 3   spironolactone  (ALDACTONE ) 100 MG  tablet Take 1 tablet (100 mg total) by mouth 2 (two) times daily. 60 tablet 1   sucralfate  (CARAFATE ) 1 GM/10ML suspension Take 1 g by mouth in the morning, at noon, in the evening, and at bedtime.     traZODone  (DESYREL ) 50 MG tablet Take 1 tablet (50 mg total) by mouth at bedtime. 90 tablet 0   triamcinolone  ointment (KENALOG ) 0.1 % Apply topically twice daily to BODY as needed for red, sandpaper like rash.  Do not use on face, groin or armpits. 80 g 1   TRULICITY  3 MG/0.5ML SOAJ INJECT 3MG  SUBCUTANEOUSLY AS DIRECTED ONCE WEEKLY 6 mL 11   insulin  lispro (HUMALOG ) 100 UNIT/ML KwikPen INJECT 3-7 UNITS INTO THE SKIN THREE TIMES A DAY. 3U IF BLOOD SUGAR IS MORE THAN 200, 5U IF BLOOD SUGAR IS MORE THAN 250, 7U IF BLOOD SUGAR IS MORE THAN 300. (Patient not taking: Reported on 08/23/2024) 15 mL 11   No current facility-administered medications for this visit.  Known Allergies:  Allergies[1] Past Surgical History: Past Surgical History:  Procedure Laterality Date   BIOPSY  01/18/2021   Procedure: BIOPSY;  Surgeon: San Sandor GAILS, DO;  Location: [facility];  Service: Gastroenterology;;   BIOPSY  10/28/2023   Procedure: BIOPSY;  Surgeon: Lyndel Deward PARAS, MD;  Location: [facility];  Service: General;;   CHOLECYSTECTOMY N/A 05/11/2021   Procedure: LAPAROSCOPIC CHOLECYSTECTOMY WITH INTRAOPERATIVE CHOLANGIOGRAM;  Surgeon: Lyndel Deward PARAS, MD;  Location: [facility];  Service: General;  Laterality: N/A;   CYSTOSCOPY N/A 05/10/2024   Procedure: PHYLLIS;  Surgeon: Cleatus Moccasin, MD;  Location: [facility] OR;  Service: Gynecology;  Laterality: N/A;   DILATION AND CURETTAGE OF UTERUS     ENDOMETRIAL BIOPSY  12/19/2017   normal per pt   ESOPHAGOGASTRODUODENOSCOPY N/A 10/28/2023   Procedure: UPPER ENDOSCOPY;  Surgeon: Lyndel Deward PARAS, MD;  Location: [facility];  Service: General;  Laterality: N/A;   ESOPHAGOGASTRODUODENOSCOPY (EGD) WITH PROPOFOL  N/A 01/18/2021   Procedure:  ESOPHAGOGASTRODUODENOSCOPY (EGD) WITH PROPOFOL ;  Surgeon: San Sandor GAILS, DO;  Location: [facility];  Service: Gastroenterology;  Laterality: N/A;   HIATAL HERNIA REPAIR N/A 09/30/2022   Procedure: HERNIA REPAIR HIATAL;  Surgeon: Lyndel Deward PARAS, MD;  Location: [facility];  Service: General;  Laterality: N/A;   LAPAROSCOPIC INSERTION GASTROSTOMY TUBE N/A 12/02/2022   Procedure: LAPAROSCOPIC REMNANT GASTROSTOMY G TUBE;  Surgeon: Lyndel Deward PARAS, MD;  Location: [facility];  Service: General;  Laterality: N/A;   LAPAROSCOPIC ROUX-EN-Y GASTRIC BYPASS WITH HIATAL HERNIA REPAIR  09/30/2022   LAPAROSCOPY N/A 12/02/2022   Procedure: LAPAROSCOPY DIAGNOSTIC;  Surgeon: Lyndel Deward PARAS, MD;  Location: [facility];  Service: General;  Laterality: N/A;   TONSILLECTOMY     TOTAL LAPAROSCOPIC HYSTERECTOMY WITH SALPINGECTOMY Bilateral 05/10/2024   Procedure: HYSTERECTOMY, TOTAL, LAPAROSCOPIC, WITH SALPINGECTOMY;  Surgeon: Cleatus Moccasin, MD;  Location: [facility];  Service: Gynecology;  Laterality: Bilateral;  W/ TAP Block by Dr. Cleatus   TUBAL LIGATION     UPPER GI ENDOSCOPY N/A 09/30/2022   Procedure: UPPER GI ENDOSCOPY;  Surgeon: Lyndel Deward PARAS, MD;  Location: [facility];  Service: General;  Laterality: N/A;   UPPER GI ENDOSCOPY N/A 12/02/2022   Procedure: UPPER ENDOSCOPY;  Surgeon: Lyndel Deward PARAS, MD;  Location: [facility];  Service: General;  Laterality: N/A;   Family History: Family History  Problem Relation Age of Onset   Diabetes Mother    Hypertension Mother    Hyperlipidemia Mother    Obesity Mother    Peripheral Artery Disease Mother    Sinusitis Mother    HIV Father        died from complications (IVDU)   Alcohol abuse Father    Drug abuse Father    Ovarian cancer Maternal Grandmother    Lupus Maternal Aunt    Colon cancer Neg Hx    Esophageal cancer Neg Hx    Liver disease Neg Hx    Pancreatic cancer Neg Hx    Stomach cancer Neg Hx    Sleep apnea Neg Hx    Rectal cancer Neg Hx     COPD Neg Hx    Emphysema Neg Hx    Eczema Neg Hx    Angioedema Neg Hx    Social History: Elizabeth [name] lives single-family home but 49 years old.  No water  damage in the house.  Carpet throughout.  Electric heating window and fan for cooling.  No roaches in the house and best to be off floor.  No dust mite precautions.  No tobacco exposure.  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  Smoked from 2019-20 22 about half pack per day.   ROS:  All other systems negative except as noted per HPI.  Objective:  Blood pressure 100/72, pulse 86, temperature 98.5 F (36.9 C), temperature source Oral, resp. rate 20, height 5' 2 (1.575 m), weight 228 lb 9.6 oz (103.7 kg), last menstrual period 11/20/2023, SpO2 100%. Body mass index is 41.81 kg/m. Physical Exam:  General Appearance:  Alert, cooperative, no distress, appears stated age  Head:  Normocephalic, without obvious abnormality, atraumatic  Eyes:  Conjunctiva clear, EOM's intact  Ears EACs normal bilaterally  Nose: Nares normal, normal mucosa, no visible anterior polyps, and septum midline  Throat: Lips, tongue normal; teeth and gums normal, normal posterior oropharynx  Neck: Supple, symmetrical  Lungs:   clear to auscultation bilaterally, Respirations unlabored, no coughing  Heart:  regular rate and rhythm and no murmur, Appears well perfused  Extremities: No edema  Skin: Scattered hyperpigmented papules on arms and Skin color, texture, turgor normal  Neurologic: No gross deficits   Diagnostics: None done    Labs:  Lab Orders  No laboratory test(s) ordered today     Assessment and Plan  Assessment and Plan Assessment & Plan Concern for Bechet's Disease  Symptoms and criteria align with Behet's disease diagnosis. Differential includes prurigo nodularis, but presentation supports pathergy.  Reviewed symptoms at length and she does endorse recurrent oral and genital ulcers that are consistent with  pictures shown from up-to-date of oral and genital ulcers and Bechet's.  Skin lesions consistent with pathergy is only triggered from IVs or injections (skin trauma) - Referred to rheumatology for evaluation. - Instructed to document new oral and genital ulcers with photographs. - Advised to avoid skin trauma.  Skin lesions (possible prurigo nodularis) Lesions occur post-trauma, suggesting pathergy. Topical steroids may be ineffective; consider phototherapy or biologics.  - Prescribed triamcinolone  0.1% ointment twice daily as needed on lesions, avoiding groin, armpit, or face. - Referred to dermatology for further evaluation and potential phototherapy or biologic treatment. - Advised to avoid skin trauma. - Continue gentle skin care  Food sensitivity evaluation Postprandial symptoms with history of gastritis and ulcer. Food allergy testing deferred due to potential skin reactions. - Deferred food allergy testing until after dermatology and rheumatology evaluations. - Advised to avoid antihistamines for three days prior to future allergy testing. - Continue to avoid tomato in all forms   Follow up: after derm and rheumatology evaluation       This note in its entirety was forwarded to the Provider who requested this consultation.  Other:    Thank you for your kind referral. I appreciate the opportunity to take part in Naomii's care. Please do not hesitate to contact me with questions.  Sincerely,  Thank you so much for letting me partake in your care today.  Don't hesitate to reach out if you have any additional concerns!  Hargis Springer, MD  [facility], [place]          [1]  Allergies Allergen Reactions   Nsaids     Pt reported due to Bariatric surgery 09/2022   Tomato Hives, Itching and Rash   "

## 2024-11-03 ENCOUNTER — Encounter: Payer: Self-pay | Admitting: Obstetrics and Gynecology

## 2024-11-03 ENCOUNTER — Other Ambulatory Visit (HOSPITAL_COMMUNITY)
Admission: RE | Admit: 2024-11-03 | Discharge: 2024-11-03 | Disposition: A | Discharge status: Home / Self Care | Source: Ambulatory Visit | Attending: Obstetrics and Gynecology | Admitting: Obstetrics and Gynecology

## 2024-11-03 ENCOUNTER — Ambulatory Visit: Admitting: Obstetrics and Gynecology

## 2024-11-03 VITALS — BP 99/68 | HR 71 | Ht 62.0 in | Wt 228.0 lb

## 2024-11-03 DIAGNOSIS — N898 Other specified noninflammatory disorders of vagina: Secondary | ICD-10-CM | POA: Diagnosis present

## 2024-11-03 NOTE — Therapy (Incomplete)
 " OUTPATIENT PHYSICAL THERAPY UPPER EXTREMITY EVALUATION   Patient Name: Elizabeth [name] MRN: [id number] DOB:17-Jul-1976, 49 y.o., female Today's Date: 11/03/2024  END OF SESSION:   Past Medical History:  Diagnosis Date   Anemia    Anxiety    Arthritis    Astigmatism    Axillary neuropathy 10/04/2024   Depression    Diabetes mellitus with complication (HCC)    Diabetic retinopathy (HCC)    Dry mouth    Fatigue    GERD (gastroesophageal reflux disease)    Heartburn    Hepatic steatosis    Hepatomegaly    High cholesterol    Hypertension    Keratoconjunctivitis    Liver disease    Muscle pain    Nausea and vomiting 04/21/2023   Ovarian cyst    Positive ANA (antinuclear antibody)    Pulmonary nodules    Stomach ulcer    Vitamin D  deficiency    Weakness    Past Surgical History:  Procedure Laterality Date   BIOPSY  01/18/2021   Procedure: BIOPSY;  Surgeon: San Sandor GAILS, DO;  Location: [facility];  Service: Gastroenterology;;   BIOPSY  10/28/2023   Procedure: BIOPSY;  Surgeon: Lyndel Deward PARAS, MD;  Location: [facility];  Service: General;;   CHOLECYSTECTOMY N/A 05/11/2021   Procedure: LAPAROSCOPIC CHOLECYSTECTOMY WITH INTRAOPERATIVE CHOLANGIOGRAM;  Surgeon: Lyndel Deward PARAS, MD;  Location: [facility];  Service: General;  Laterality: N/A;   CYSTOSCOPY N/A 05/10/2024   Procedure: PHYLLIS;  Surgeon: Cleatus Moccasin, MD;  Location: [facility] OR;  Service: Gynecology;  Laterality: N/A;   DILATION AND CURETTAGE OF UTERUS     ENDOMETRIAL BIOPSY  12/19/2017   normal per pt   ESOPHAGOGASTRODUODENOSCOPY N/A 10/28/2023   Procedure: UPPER ENDOSCOPY;  Surgeon: Lyndel Deward PARAS, MD;  [facility];  Service: General;  Laterality: N/A;   ESOPHAGOGASTRODUODENOSCOPY (EGD) WITH PROPOFOL  N/A 01/18/2021   Procedure: ESOPHAGOGASTRODUODENOSCOPY (EGD) WITH PROPOFOL ;  Surgeon: San Sandor GAILS, DO;  [facility]: [facility];  Service: Gastroenterology;  Laterality: N/A;    HIATAL HERNIA REPAIR N/A 09/30/2022   Procedure: HERNIA REPAIR HIATAL;  Surgeon: Lyndel Deward PARAS, MD;  [facility];  Service: General;  Laterality: N/A;   LAPAROSCOPIC INSERTION GASTROSTOMY TUBE N/A 12/02/2022   Procedure: LAPAROSCOPIC REMNANT GASTROSTOMY G TUBE;  Surgeon: Lyndel Deward PARAS, MD;  [facility];  Service: General;  Laterality: N/A;   LAPAROSCOPIC ROUX-EN-Y GASTRIC BYPASS WITH HIATAL HERNIA REPAIR  09/30/2022   LAPAROSCOPY N/A 12/02/2022   Procedure: LAPAROSCOPY DIAGNOSTIC;  Surgeon: Lyndel Deward PARAS, MD;  [facility];  Service: General;  Laterality: N/A;   TONSILLECTOMY     TOTAL LAPAROSCOPIC HYSTERECTOMY WITH SALPINGECTOMY Bilateral 05/10/2024   Procedure: HYSTERECTOMY, TOTAL, LAPAROSCOPIC, WITH SALPINGECTOMY;  Surgeon: Cleatus Moccasin, MD;  Location: [facility] OR;  Service: Gynecology;  Laterality: Bilateral;  W/ TAP Block by Dr. Cleatus   TUBAL LIGATION     UPPER GI ENDOSCOPY N/A 09/30/2022   Procedure: UPPER GI ENDOSCOPY;  Surgeon: Lyndel Deward PARAS, MD;  [facility];  Service: General;  Laterality: N/A;   UPPER GI ENDOSCOPY N/A 12/02/2022   Procedure: UPPER ENDOSCOPY;  Surgeon: Lyndel Deward PARAS, MD;  [facility];  Service: General;  Laterality: N/A;   Patient Active Problem List   Diagnosis Date Noted   Axillary neuropathy 10/04/2024   Anxiety 10/01/2024   Diabetes mellitus with complication (HCC)    Constipation 06/09/2024   Pain due to intrauterine contraceptive device (IUD) 05/10/2024   Elevated lipase 04/20/2024  Acute cystitis without hematuria 04/20/2024   Proteinuria 04/20/2024   Multiple gastric ulcers 11/17/2023   Insomnia 08/25/2023   Chronic gastritis without bleeding 04/11/2023   Chronic right shoulder pain 02/18/2023   History of gastric bypass 12/02/2022   Positive ANA (antinuclear antibody) 11/26/2022   Osteoarthritis of knee 03/19/2022   Preventative health care 12/31/2021   Drug-induced constipation 12/31/2021    Depression 11/07/2021   Chronic abdominal pain 04/06/2021   Gastroesophageal reflux disease without esophagitis    Hyperlipidemia 01/21/2018   Uncontrolled type 2 diabetes mellitus with hyperglycemia (HCC) 01/21/2018   Keratoconjunctivitis sicca of both eyes not specified as Sjogren's 04/04/2017   Regular astigmatism of both eyes 04/04/2017   Vitamin D  deficiency 12/02/2016   Essential hypertension 02/07/2016   Morbid obesity with BMI of 50.0-59.9, adult (HCC) 02/07/2016    PCP: Daryl Setter, NP   REFERRING PROVIDER: Charles Redell LABOR, DO   REFERRING DIAG: M77.11 (ICD-10-CM) - Lateral epicondylitis of right elbow  THERAPY DIAG:  No diagnosis found.  RATIONALE FOR EVALUATION AND TREATMENT: Rehabilitation  ONSET DATE: ***  NEXT MD VISIT: ***   SUBJECTIVE:                                                                                                                                                                                                         SUBJECTIVE STATEMENT: 49 y/o patient referred to PT from Dr Charles for R lateral epicondylitis.  There is also mention in Dr Fredrich note of doing some PT for her R shoulder pain.   She was seen by us  back in 7/25 for 1 visit for the R shoulder pain, but was unable to return to clinic for further therapy at that time.   She has had R shoulder MRI that revealed small partial-thickness tears and chronic tendinopathy of the supraspinatus and infraspinatus.  She has also had a recent EMG/NCV that showed axillary nerve compression for which she has undergone a U/S guided hydrodissection procedure by Dr Charles on 10/28/24.   Her symptoms are posterior R shoulder pain,  radiation/tingling into the RUE and into the fingers/hand, R elbow pain and point tenderness over the lateral epicondyle.   Possibility could also be radial nerve compressoin.   PAIN: Are you having pain? Yes: NPRS scale: *** Pain location: *** Pain description:  *** Aggravating factors: *** Relieving factors: ***  PERTINENT HISTORY:  Diabetes, retinopathy, liver dz/steatosis, anemia, anxiety, OA, depression, fatigue, h/o gastric bypass surgery  PRECAUTIONS: {Therapy precautions:24002}  HAND DOMINANCE: {Hand Dominance:29389}  RED FLAGS: {PT Red Flags:29287}  WEIGHT BEARING RESTRICTIONS: No  FALLS:  Has patient fallen in last 6 months? {fallsyesno:27318}  LIVING ENVIRONMENT: Lives with: {OPRC lives with:25569::lives with their family} Lives in: {Lives in:25570} Stairs: {opstairs:27293} Has following equipment at home: {Assistive devices:23999}  OCCUPATION: ***  PLOF: {PLOF:24004}  PATIENT GOALS: ***   OBJECTIVE: (objective measures completed at initial evaluation unless otherwise dated)  DIAGNOSTIC FINDINGS:  Findings of chronic R axillary neuropathy on needle EMG in the deltoid and teres minor muscles.      Joesph JAYSON Likes, DO 10/04/2024   MRI demonstrated small partial-thickness tears and chronic tendinopathy of the supraspinatus and infraspinatus.  PATIENT SURVEYS:  Quick Dash:  QUICK DASH  Please rate your ability do the following activities in the last week by selecting the number below the appropriate response.   Activities Rating  Open a tight or new jar.  {Q-dash (1-6):32984}  Do heavy household chores (e.g., wash walls, floors). {Q-dash DEVELOPMENT WORKER, COMMUNITY  Carry a shopping bag or briefcase {Q-dash [facility] your back. {Q-dash (1-6):32984}  Use a knife to cut food. {Q-dash (1-6):32984}  Recreational activities in which you take some force or impact through your arm, shoulder or hand (e.g., golf, hammering, tennis, etc.). {Q-dash (1-6):32984}  During the past week, to what extent has your arm, shoulder or hand problem interfered with your normal social activities with family, friends, neighbors or groups?  {Q-Dash 7:32985}  During the past week, were you limited in your work or other regular daily  activities as a result of your arm, shoulder or hand problem? {Q-dash 8:32988}  Rate the severity of the following symptoms in the last week: Arm, Shoulder, or hand pain. {Q-dash 0-89:67013}  Rate the severity of the following symptoms in the last week: Tingling (pins and needles) in your arm, shoulder or hand. {Q-dash 0-89:67013}  During the past week, how much difficulty have you had sleeping because of the pain in your arm, shoulder or hand?  {Q-dash 11:32987}   (A QuickDASH score may not be calculated if there is greater than 1 missing item.)  Quick Dash Disability/Symptom Score: [(sum of *** (n) responses/*** (n)] x 25 = ***  Minimally Clinically Important Difference (MCID): 15-20 points  (Franchignoni, F. et al. (2013). Minimally clinically important difference of the disabilities of the arm, shoulder, and hand outcome measures (DASH) and its shortened version (Quick DASH). Journal of Orthopaedic & Sports Physical Therapy, 44(1), 30-39)   COGNITION: Overall cognitive status: {cognition:24006}     SENSATION: {sensation:27233}  POSTURE: ***  UPPER EXTREMITY ROM:   Active ROM Right eval Left eval  Shoulder flexion    Shoulder extension    Shoulder abduction    Shoulder adduction    Shoulder internal rotation    Shoulder external rotation    Elbow flexion    Elbow extension    Wrist flexion    Wrist extension    Wrist ulnar deviation    Wrist radial deviation    Wrist pronation    Wrist supination    (Blank rows = not tested)  Passive ROM Right eval Left eval  Thumb MCP (0-60)    Thumb IP (0-80)    Thumb Radial abd/add (0-55)     Thumb Palmar abd/add (0-45)     Thumb Opposition to Small Finger     Index MCP (0-90)     Index PIP (0-100)     Index DIP (0-70)      Long MCP (0-90)      Long PIP (0-100)  Long DIP (0-70)      Ring MCP (0-90)      Ring PIP (0-100)      Ring DIP (0-70)      Little MCP (0-90)      Little PIP (0-100)      Little DIP (0-70)       (Blank rows = not tested)  UPPER EXTREMITY MMT:  MMT Right eval Left eval  Shoulder flexion    Shoulder extension    Shoulder abduction    Shoulder adduction    Shoulder internal rotation    Shoulder external rotation    Middle trapezius    Lower trapezius    Elbow flexion    Elbow extension    Wrist flexion    Wrist extension    Wrist ulnar deviation    Wrist radial deviation    Wrist pronation    Wrist supination    Grip strength (lbs)    (Blank rows = not tested)  SHOULDER SPECIAL TESTS: Impingement tests: {shoulder impingement test:25231:a} SLAP lesions: {SLAP lesions:25232} Instability tests: {shoulder instability test:25233} Rotator cuff assessment: {rotator cuff assessment:25234} Biceps assessment: {biceps assessment:25235}  JOINT MOBILITY TESTING:  ***  PALPATION:  ***    TODAY'S TREATMENT:   SELF CARE: Provided education on PT POC progression., initial HEP    PATIENT EDUCATION:  Education details: PT eval findings, anticipated POC, and initial HEP  Person educated: Patient Education method: Explanation, Demonstration, Verbal cues, Tactile cues, Handouts, and MedBridge[employer] app access provided Education comprehension: verbalized understanding, verbal cues required, tactile cues required, and needs further education  HOME EXERCISE PROGRAM: ***   ASSESSMENT:  CLINICAL IMPRESSION: Elizabeth [name] is a 49 y.o. female who was referred to physical therapy for evaluation and treatment for R lateral epicondylitis.  She is also having R shoulder pain due to supraspinatus calcific tendinopathy, small supra/infraspinatus tears, and axillary nerve compression syndrome and had recent hydrodissection procedure on 10/29/23.    Patient reports onset of *** pain beginning ***. Pain is worse with ***.  Patient has deficits in *** ROM, *** flexibility, *** strength, ***abnormal posture, and TTP with abnormal muscle tension *** which are interfering with ADLs and are  impacting quality of life.  On QuickDASH patient scored ***/100 demonstrating ***% disability.  Jyra will benefit from skilled PT to address above deficits to improve mobility and activity tolerance with decreased pain interference.  OBJECTIVE IMPAIRMENTS: {opptimpairments:25111}.   ACTIVITY LIMITATIONS: {activitylimitations:27494}  PARTICIPATION LIMITATIONS: {participationrestrictions:25113}  PERSONAL FACTORS: Age, Fitness, Time since onset of injury/illness/exacerbation, and 1-2 comorbidities: Diabetes, retinopathy, liver dz/steatosis, anemia, anxiety, OA, depression, fatigue, h/o gastric bypass surgery are also affecting patient's functional outcome.   REHAB POTENTIAL: Good  CLINICAL DECISION MAKING: Evolving/moderate complexity  EVALUATION COMPLEXITY: Moderate   GOALS: Goals reviewed with patient? Yes  SHORT TERM GOALS: Target date: ***  Patient will be independent with initial HEP to improve outcomes and carryover.  Baseline: 100% PT assist required for correct completion Goal status: INITIAL  2.  Patient will report 25% improvement in RUE pain to improve QOL.  Baseline: *** Goal status: INITIAL  3.  *** Baseline: *** Goal status: {GOALSTATUS:25110}  LONG TERM GOALS: Target date: ***  Patient will be independent with ongoing/advanced HEP for self-management at home.  Baseline: no advanced HEP yet Goal status: INITIAL  2.  Patient will report 50-75% improvement in RUE pain to improve QOL.  Baseline: *** Goal status: INITIAL  3.  Patient to demonstrate improved upright posture with posterior shoulder girdle engaged  to promote improved functional UE use. Baseline: *** Goal status: INITIAL  4.  Patient to improve RUE AROM to WNL without pain provocation to allow for increased ease of ADLs.  Baseline: Refer to above UE ROM table Goal status: INITIAL  5.  Patient will demonstrate improved RUE strength to >/= 5/5 for functional UE use. Baseline: Refer to above UE  MMT table Goal status: INITIAL  6  Patient will report </= ***% on QuickDASH (MCID = 14%) overall more so to demonstrate improved functional ability.  Baseline: *** Goal status: INITIAL  7.  Patient will ***   Baseline: *** Goal status: {GOALSTATUS:25110}   8. *** Baseline: *** Goal status: {GOALSTATUS:25110}   PLAN:  PT FREQUENCY: 1-2x/week  PT DURATION: 8 weeks  PLANNED INTERVENTIONS: 97110-Therapeutic exercises, 97530- Therapeutic activity, [id number]- Neuromuscular re-education, 97535- Self Care, 02859- Manual therapy, G0283- Electrical stimulation (unattended), 97016- Vasopneumatic device, [id number]- Ultrasound, [id number]- Traction (mechanical), 20560 (1-2 muscles), 20561 (3+ muscles)- Dry Needling, Patient/Family education, Taping, Joint mobilization, Spinal mobilization, Cryotherapy, and Moist heat  PLAN FOR NEXT SESSION: ***   RED SENIOR, PT 11/03/2024, 8:59 AM  "

## 2024-11-03 NOTE — Progress Notes (Signed)
 "  GYNECOLOGY OFFICE NOTE  History:  49 y.o. Elizabeth [name] here today for 2 days of vaginal discharge itching, burning. Not associated with urinary symptoms. No bleeding, no other issues. Has had pain like this before.   Past Medical History:  Diagnosis Date   Anemia    Anxiety    Arthritis    Astigmatism    Axillary neuropathy 10/04/2024   Depression    Diabetes mellitus with complication (HCC)    Diabetic retinopathy (HCC)    Dry mouth    Fatigue    GERD (gastroesophageal reflux disease)    Heartburn    Hepatic steatosis    Hepatomegaly    High cholesterol    Hypertension    Keratoconjunctivitis    Liver disease    Muscle pain    Nausea and vomiting 04/21/2023   Ovarian cyst    Positive ANA (antinuclear antibody)    Pulmonary nodules    Stomach ulcer    Vitamin D  deficiency    Weakness     Past Surgical History:  Procedure Laterality Date   BIOPSY  01/18/2021   Procedure: BIOPSY;  Surgeon: San Sandor GAILS, DO;  [facility];  Service: Gastroenterology;;   BIOPSY  10/28/2023   Procedure: BIOPSY;  Surgeon: Lyndel Deward PARAS, MD;  Location: [facility];  Service: General;;   CHOLECYSTECTOMY N/A 05/11/2021   Procedure: LAPAROSCOPIC CHOLECYSTECTOMY WITH INTRAOPERATIVE CHOLANGIOGRAM;  Surgeon: Lyndel Deward PARAS, MD;  [facility];  Service: General;  Laterality: N/A;   CYSTOSCOPY N/A 05/10/2024   Procedure: PHYLLIS;  Surgeon: Cleatus Moccasin, MD;  Location: [facility] OR;  Service: Gynecology;  Laterality: N/A;   DILATION AND CURETTAGE OF UTERUS     ENDOMETRIAL BIOPSY  12/19/2017   normal per pt   ESOPHAGOGASTRODUODENOSCOPY N/A 10/28/2023   Procedure: UPPER ENDOSCOPY;  Surgeon: Lyndel Deward PARAS, MD;  [facility];  Service: General;  Laterality: N/A;   ESOPHAGOGASTRODUODENOSCOPY (EGD) WITH PROPOFOL  N/A 01/18/2021   Procedure: ESOPHAGOGASTRODUODENOSCOPY (EGD) WITH PROPOFOL ;  Surgeon: San Sandor GAILS, DO;  [facility];  Service:  Gastroenterology;  Laterality: N/A;   HIATAL HERNIA REPAIR N/A 09/30/2022   Procedure: HERNIA REPAIR HIATAL;  Surgeon: Lyndel Deward PARAS, MD;  [facility];  Service: General;  Laterality: N/A;   LAPAROSCOPIC INSERTION GASTROSTOMY TUBE N/A 12/02/2022   Procedure: LAPAROSCOPIC REMNANT GASTROSTOMY G TUBE;  Surgeon: Lyndel Deward PARAS, MD;  [facility];  Service: General;  Laterality: N/A;   LAPAROSCOPIC ROUX-EN-Y GASTRIC BYPASS WITH HIATAL HERNIA REPAIR  09/30/2022   LAPAROSCOPY N/A 12/02/2022   Procedure: LAPAROSCOPY DIAGNOSTIC;  Surgeon: Lyndel Deward PARAS, MD;  Location: [facility];  Service: General;  Laterality: N/A;   TONSILLECTOMY     TOTAL LAPAROSCOPIC HYSTERECTOMY WITH SALPINGECTOMY Bilateral 05/10/2024   Procedure: HYSTERECTOMY, TOTAL, LAPAROSCOPIC, WITH SALPINGECTOMY;  Surgeon: Cleatus Moccasin, MD;  Location: [facility] OR;  Service: Gynecology;  Laterality: Bilateral;  W/ TAP Block by Dr. Cleatus   TUBAL LIGATION     UPPER GI ENDOSCOPY N/A 09/30/2022   Procedure: UPPER GI ENDOSCOPY;  Surgeon: Lyndel Deward PARAS, MD;  Location: [facility];  Service: General;  Laterality: N/A;   UPPER GI ENDOSCOPY N/A 12/02/2022   Procedure: UPPER ENDOSCOPY;  Surgeon: Lyndel Deward PARAS, MD;  Location: [facility];  Service: General;  Laterality: N/A;    Current Medications[1]  The following portions of the patient's history were reviewed and updated as appropriate: allergies, current medications, past family history, past medical history, past social history, past surgical history and  problem list.   Review of Systems:  Pertinent items noted in HPI and remainder of comprehensive ROS otherwise negative.   Objective:  Physical Exam BP 99/68   Pulse 71   Ht 5' 2 (1.575 m)   Wt 228 lb (103.4 kg)   LMP 11/20/2023   BMI 41.70 kg/m  CONSTITUTIONAL: Well-developed, well-nourished female in no acute distress.  HENT:  Normocephalic, atraumatic. External right and left ear normal. Oropharynx is clear and  moist EYES: Conjunctivae and EOM are normal. Pupils are equal, round, and reactive to light. No scleral icterus.  NECK: Normal range of motion, supple, no masses SKIN: Skin is warm and dry. No rash noted. Not diaphoretic. No erythema. No pallor. NEUROLOGIC: Alert and oriented to person, place, and time. Normal reflexes, muscle tone coordination. No cranial nerve deficit noted. PSYCHIATRIC: Normal mood and affect. Normal behavior. Normal judgment and thought content. CARDIOVASCULAR: Normal heart rate noted RESPIRATORY: Effort normal, no problems with respiration noted ABDOMEN: Soft, no distention noted.   PELVIC: Normal appearing external genitalia; normal appearing vaginal mucosa and vaginal cuff.  Small amount white creamy discharge. pelvic cultures obtained. Cuff palpates intact, no dehiscence or tenderness MUSCULOSKELETAL: Normal range of motion. No edema noted.  Exam done with chaperone present.  Labs and Imaging US  SOFT TISSUE RT UPPER EXTREMITY LTD (NON-VASCULAR) Result Date: 10/30/2024 Right Axillary Nerve Hydrodissection Injection at the Quadrilateral Space with Ultrasound Guidance Procedure Note Milaina Sher 10-25-1975 Indications: Pain Procedure Details Following the description of risks including infection, bleeding, and damage to surrounding structures patient provided written consent for right axillary nerve hydrodissection injection with ultrasound guidance. Ultrasound was used to identify the right axillary nerve at the quadrilateral space. The patient was then prepped in the usual fashion with chlorhexidine . Following topical anesthetization with ethyl chloride, the patient was injected into the right axillary nerve sheath with a solution of 5cc of 5% dextrose  in sterile water . This was well visualized under ultrasound, please see associated photographic documentation. Patient tolerated well without complication. Precautions provided. Cleaned and dressing applied.    Assessment &  Plan:  1. Vaginal discharge (Primary) Swab today Will f/u results - Cervicovaginal ancillary only( [place])  2. Vaginal itching - Cervicovaginal ancillary only( [place])   Routine preventative health maintenance measures emphasized. Please refer to After Visit Summary for other counseling recommendations.   Return for Followup, prn.   K. Yolanda Moats, MD, [facility] Attending Center for [facility])       [1]  Current Outpatient Medications:    ACCU-CHEK GUIDE TEST test strip, TEST MORNING, NOON, AND AT BEDTIME, Disp: 250 each, Rfl: 11   Accu-Chek Softclix Lancets lancets, 4 (four) times daily., Disp: , Rfl:    acetaminophen  (TYLENOL ) 500 MG tablet, Take 500-1,000 mg by mouth every 6 (six) hours as needed (pain.)., Disp: , Rfl:    Blood Glucose Monitoring Suppl (ACCU-CHEK GUIDE) w/Device KIT, USE MORNING, NOON, AND AT BEDTIME AS DIRECTED, Disp: 1 kit, Rfl: 11   busPIRone  (BUSPAR ) 7.5 MG tablet, Take 1 tablet (7.5 mg total) by mouth 2 (two) times daily., Disp: 60 tablet, Rfl: 2   Calcium  Citrate-Vitamin D  (CALCIUM  CITRATE CHEWY BITE PO), Take 1 tablet by mouth at bedtime., Disp: , Rfl:    dapagliflozin  propanediol (FARXIGA ) 10 MG TABS tablet, TAKE 1 TABLET BY MOUTH EVERY DAY BEFORE BREAKFAST, Disp: 30 tablet, Rfl: 2   escitalopram  (LEXAPRO ) 20 MG tablet, Take 1 tablet (20 mg total) by mouth daily., Disp: 90 tablet, Rfl: 1  esomeprazole  (NEXIUM ) 40 MG capsule, Take 1 capsule (40 mg total) by mouth 2 (two) times daily. Open capsule and sprinkle on food at breakfast and dinner for better absorption., Disp: 180 capsule, Rfl: 3   famotidine  (PEPCID ) 20 MG tablet, Take 1 tablet (20 mg total) by mouth 2 (two) times daily., Disp: 180 tablet, Rfl: 1   gabapentin  (NEURONTIN ) 400 MG capsule, Take 1 capsule (400 mg total) by mouth 3 (three) times daily., Disp: , Rfl:    hydrOXYzine  (VISTARIL ) 25 MG capsule, Take 1 capsule (25 mg total) by mouth every 8 (eight)  hours as needed for anxiety., Disp: 30 capsule, Rfl: 0   Lancets Misc. (ACCU-CHEK SOFTCLIX LANCET DEV) KIT, USE MORNING, NOON, AND AT BEDTIME, Disp: 1 kit, Rfl: 11   lisinopril  (ZESTRIL ) 10 MG tablet, Take 1 tablet (10 mg total) by mouth daily., Disp: 90 tablet, Rfl: 1   methocarbamol  (ROBAXIN ) 500 MG tablet, Take 1 tablet (500 mg total) by mouth every 8 (eight) hours as needed for muscle spasms., Disp: 90 tablet, Rfl: 0   Multiple Vitamins-Minerals (BARIATRIC FUSION PO), Take 1 tablet by mouth in the morning., Disp: , Rfl:    naloxone (NARCAN) nasal spray 4 mg/0.1 mL, Place 1 spray into the nose as needed., Disp: , Rfl:    ondansetron  (ZOFRAN -ODT) 4 MG disintegrating tablet, PLACE 1 TABLET ON TONGUE AND ALLOW TO DISSOLVE EVERY 8 HOURS AS NEEDED FOR NAUSEA OR VOMITING, Disp: 90 tablet, Rfl: 3   rosuvastatin  (CRESTOR ) 20 MG tablet, Take 1 tablet (20 mg total) by mouth daily., Disp: 90 tablet, Rfl: 3   spironolactone  (ALDACTONE ) 100 MG tablet, Take 1 tablet (100 mg total) by mouth 2 (two) times daily., Disp: 60 tablet, Rfl: 1   sucralfate  (CARAFATE ) 1 GM/10ML suspension, Take 1 g by mouth in the morning, at noon, in the evening, and at bedtime., Disp: , Rfl:    traZODone  (DESYREL ) 50 MG tablet, Take 1 tablet (50 mg total) by mouth at bedtime., Disp: 90 tablet, Rfl: 0   triamcinolone  ointment (KENALOG ) 0.1 %, Apply topically twice daily to BODY as needed for red, sandpaper like rash.  Do not use on face, groin or armpits., Disp: 80 g, Rfl: 1   TRULICITY  3 MG/0.5ML SOAJ, INJECT 3MG  SUBCUTANEOUSLY AS DIRECTED ONCE WEEKLY, Disp: 6 mL, Rfl: 11   insulin  lispro (HUMALOG ) 100 UNIT/ML KwikPen, INJECT 3-7 UNITS INTO THE SKIN THREE TIMES A DAY. 3U IF BLOOD SUGAR IS MORE THAN 200, 5U IF BLOOD SUGAR IS MORE THAN 250, 7U IF BLOOD SUGAR IS MORE THAN 300. (Patient not taking: Reported on 08/23/2024), Disp: 15 mL, Rfl: 11  "

## 2024-11-04 ENCOUNTER — Ambulatory Visit: Payer: Self-pay | Admitting: Obstetrics and Gynecology

## 2024-11-04 ENCOUNTER — Ambulatory Visit: Admitting: Rehabilitation

## 2024-11-04 LAB — CERVICOVAGINAL ANCILLARY ONLY
Bacterial Vaginitis (gardnerella): POSITIVE — AB
Candida Glabrata: POSITIVE — AB
Candida Vaginitis: POSITIVE — AB
Comment: NEGATIVE
Comment: NEGATIVE
Comment: NEGATIVE

## 2024-11-04 MED ORDER — FLUCONAZOLE 150 MG PO TABS: 150.0000 mg | ORAL_TABLET | Freq: Once | ORAL | 0 refills | Status: AC

## 2024-11-04 MED ORDER — METRONIDAZOLE 500 MG PO TABS: 500.0000 mg | ORAL_TABLET | Freq: Two times a day (BID) | ORAL | 0 refills | Status: AC

## 2024-11-07 ENCOUNTER — Other Ambulatory Visit: Payer: Self-pay

## 2024-11-08 NOTE — Therapy (Incomplete)
 " OUTPATIENT PHYSICAL THERAPY UPPER EXTREMITY EVALUATION   Patient Name: Elizabeth [name] MRN: [id number] DOB:03-15-1976, 49 y.o., female Today's Date: 11/08/2024  END OF SESSION:   Past Medical History:  Diagnosis Date   Anemia    Anxiety    Arthritis    Astigmatism    Axillary neuropathy 10/04/2024   Depression    Diabetes mellitus with complication (HCC)    Diabetic retinopathy (HCC)    Dry mouth    Fatigue    GERD (gastroesophageal reflux disease)    Heartburn    Hepatic steatosis    Hepatomegaly    High cholesterol    Hypertension    Keratoconjunctivitis    Liver disease    Muscle pain    Nausea and vomiting 04/21/2023   Ovarian cyst    Positive ANA (antinuclear antibody)    Pulmonary nodules    Stomach ulcer    Vitamin D  deficiency    Weakness    Past Surgical History:  Procedure Laterality Date   BIOPSY  01/18/2021   Procedure: BIOPSY;  Surgeon: San Sandor GAILS, DO;  Location: [facility];  Service: Gastroenterology;;   BIOPSY  10/28/2023   Procedure: BIOPSY;  Surgeon: Lyndel Deward PARAS, MD;  Location: [facility];  Service: General;;   CHOLECYSTECTOMY N/A 05/11/2021   Procedure: LAPAROSCOPIC CHOLECYSTECTOMY WITH INTRAOPERATIVE CHOLANGIOGRAM;  Surgeon: Lyndel Deward PARAS, MD;  Location: [facility];  Service: General;  Laterality: N/A;   CYSTOSCOPY N/A 05/10/2024   Procedure: PHYLLIS;  Surgeon: Cleatus Moccasin, MD;  Location: [facility];  Service: Gynecology;  Laterality: N/A;   DILATION AND CURETTAGE OF UTERUS     ENDOMETRIAL BIOPSY  12/19/2017   normal per pt   ESOPHAGOGASTRODUODENOSCOPY N/A 10/28/2023   Procedure: UPPER ENDOSCOPY;  Surgeon: Lyndel Deward PARAS, MD;  Location: [facility];  Service: General;  Laterality: N/A;   ESOPHAGOGASTRODUODENOSCOPY (EGD) WITH PROPOFOL  N/A 01/18/2021   Procedure: ESOPHAGOGASTRODUODENOSCOPY (EGD) WITH PROPOFOL ;  Surgeon: San Sandor GAILS, DO;  Location: [facility];  Service: Gastroenterology;  Laterality: N/A;    HIATAL HERNIA REPAIR N/A 09/30/2022   Procedure: HERNIA REPAIR HIATAL;  Surgeon: Lyndel Deward PARAS, MD;  Location: [facility];  Service: General;  Laterality: N/A;   LAPAROSCOPIC INSERTION GASTROSTOMY TUBE N/A 12/02/2022   Procedure: LAPAROSCOPIC REMNANT GASTROSTOMY G TUBE;  Surgeon: Lyndel Deward PARAS, MD;  Location: [facility];  Service: General;  Laterality: N/A;   LAPAROSCOPIC ROUX-EN-Y GASTRIC BYPASS WITH HIATAL HERNIA REPAIR  09/30/2022   LAPAROSCOPY N/A 12/02/2022   Procedure: LAPAROSCOPY DIAGNOSTIC;  Surgeon: Lyndel Deward PARAS, MD;  Location: [facility];  Service: General;  Laterality: N/A;   TONSILLECTOMY     TOTAL LAPAROSCOPIC HYSTERECTOMY WITH SALPINGECTOMY Bilateral 05/10/2024   Procedure: HYSTERECTOMY, TOTAL, LAPAROSCOPIC, WITH SALPINGECTOMY;  Surgeon: Cleatus Moccasin, MD;  Location: [facility] OR;  Service: Gynecology;  Laterality: Bilateral;  W/ TAP Block by Dr. Cleatus   TUBAL LIGATION     UPPER GI ENDOSCOPY N/A 09/30/2022   Procedure: UPPER GI ENDOSCOPY;  Surgeon: Lyndel Deward PARAS, MD;  Location: [facility];  Service: General;  Laterality: N/A;   UPPER GI ENDOSCOPY N/A 12/02/2022   Procedure: UPPER ENDOSCOPY;  Surgeon: Lyndel Deward PARAS, MD;  Location: [facility];  Service: General;  Laterality: N/A;   Patient Active Problem List   Diagnosis Date Noted   Axillary neuropathy 10/04/2024   Anxiety 10/01/2024   Diabetes mellitus with complication (HCC)    Constipation 06/09/2024   Pain due to intrauterine contraceptive device (IUD) 05/10/2024   Elevated lipase 04/20/2024  Acute cystitis without hematuria 04/20/2024   Proteinuria 04/20/2024   Multiple gastric ulcers 11/17/2023   Insomnia 08/25/2023   Chronic gastritis without bleeding 04/11/2023   Chronic right shoulder pain 02/18/2023   History of gastric bypass 12/02/2022   Positive ANA (antinuclear antibody) 11/26/2022   Osteoarthritis of knee 03/19/2022   Preventative health care 12/31/2021   Drug-induced constipation 12/31/2021    Depression 11/07/2021   Chronic abdominal pain 04/06/2021   Gastroesophageal reflux disease without esophagitis    Hyperlipidemia 01/21/2018   Uncontrolled type 2 diabetes mellitus with hyperglycemia (HCC) 01/21/2018   Keratoconjunctivitis sicca of both eyes not specified as Sjogren's 04/04/2017   Regular astigmatism of both eyes 04/04/2017   Vitamin D  deficiency 12/02/2016   Essential hypertension 02/07/2016   Morbid obesity with BMI of 50.0-59.9, adult (HCC) 02/07/2016    PCP: Daryl Setter, NP   REFERRING PROVIDER: Charles Redell LABOR, DO   REFERRING DIAG: M77.11 (ICD-10-CM) - Lateral epicondylitis of right elbow  THERAPY DIAG:  No diagnosis found.  RATIONALE FOR EVALUATION AND TREATMENT: Rehabilitation  ONSET DATE: ***  NEXT MD VISIT: ***11/11/24   SUBJECTIVE:                                                                                                                                                                                                         SUBJECTIVE STATEMENT: *** 49 y/o patient referred to PT from Dr Charles for R lateral epicondylitis.  There is also mention in Dr Fredrich note of doing some PT for her R shoulder pain.   She was seen by PT for evaluation of R shoulder pain x 2 in 2025 (4/25 & 7/25), but did not return to clinic for further therapy following either eval visit.   She has had R shoulder MRI that revealed small partial-thickness tears and chronic tendinopathy of the supraspinatus and infraspinatus.  She has also had a recent EMG/NCV that showed axillary nerve compression for which she has undergone a U/S guided Hydrodissection procedure by Dr Charles on 10/28/24.   Her symptoms are posterior R shoulder pain, radiation/tingling into the RUE and into the fingers/hand, R elbow pain and point tenderness over the lateral epicondyle.   Possibility could also be radial nerve compression.  PAIN: Are you having pain? Yes: NPRS scale: *** Pain  location: *** Pain description: *** Aggravating factors: *** Relieving factors: ***  PERTINENT HISTORY:  HTN, DM, retinopathy, liver dz/steatosis, anemia, anxiety, OA, depression, fatigue, GERD, morbid obesity, h/o gastric bypass surgery  PRECAUTIONS: {Therapy precautions:24002}  HAND DOMINANCE: Right  RED FLAGS: {  PT Red Flags:29287}  WEIGHT BEARING RESTRICTIONS: No  FALLS:  Has patient fallen in last 6 months? {fallsyesno:27318}  LIVING ENVIRONMENT: Lives with: lives with their family Lives in: House/apartment Stairs: Yes: External: 1 steps; none Has following equipment at home: Single point cane and Walker - 2 wheeled  OCCUPATION: ***Currently not working   PLOF: Independent and Leisure: Cook, read, and spend time with family ***  PATIENT GOALS: ***   OBJECTIVE: (objective measures completed at initial evaluation unless otherwise dated)  DIAGNOSTIC FINDINGS:  10/04/2024 - Findings of chronic R axillary neuropathy on needle EMG in the deltoid and teres minor muscles.  NCV & EMG Findings: Evaluation of the left axillary motor and the right axillary motor nerves showed prolonged distal onset latency (L10.7, R12.5 ms).  The right median (APB) motor nerve showed prolonged distal onset latency (4.8 ms) and reduced amplitude (4.4 mV).  The right ulnar (ADM) motor nerve showed decreased conduction velocity ([place]-Wrist, 53 m/s).  All remaining nerves (as indicated in the following tables) were within normal limits.     Needle evaluation of the right deltoid muscle showed increased insertional activity, slightly increased spontaneous activity, increased motor unit amplitude, increased motor unit duration, and moderately increased polyphasic potentials.  The right teres minor muscle showed increased insertional activity, increased motor unit amplitude, and slightly increased polyphasic potentials.  All remaining muscles (as indicated in the following table) showed no evidence of  electrical instability.     Impression:   This is an abnormal study. There is evidence of:   1) Chronic right axillary neuropathy. While NCS is nondiagnostic given similar changes seen on the left (study may be confounded by body habitus), changes in EMG are consistent with this finding.    There is no evidence of right cervical radiculopathy or large fiber peripheral neuropathy.  Joesph JAYSON Likes, DO  08/01/24 - MR SHOULDER WITHOUT IV CONTRAST: MRI demonstrated small partial-thickness tears and chronic tendinopathy of the supraspinatus and infraspinatus. CLINICAL HISTORY: Right shoulder pain.   FINDINGS:   Bones: Mild AC joint arthrosis is present with slight reactive edema in the [facility] joint. This is relatively stable. Correlation for [facility] joint symptoms. No accelerated glenohumeral arthrosis is present. There is no fracture or contusion pattern.   Rotator cuff: Mild insertional tendinosis is seen in the supra and infraspinatus tendons. Very subtle partial tears may be present. No significant partial or full-thickness tear is identified. There is no retraction. Subscapularis and teres minor tendons are intact. Mild fatty atrophy is stable in the teres minor muscle could be related to mild quadrilateral space syndrome. Clinical correlation. There is an ossicle adjacent to the lesser tuberosity anteriorly which could be a loose body or calcification in the inferior subscapularis tendon. This is slightly medial to the bicipital groove.   Labrum and biceps tendon: The biceps tendon is unremarkable. Labrum demonstrates no significant abnormality.     IMPRESSION: Mild AC joint arthrosis with slight hypertrophy and erosive changes in the distal clavicle. There is mild reactive edema. Correlation for [facility] joint symptoms. This is relatively stable.   Mild insertional tendinosis of the distal supraspinatus and infraspinatus tendons. Very subtle partial tears may be present. No significant  partial or full-thickness tear is identified.   Stable ossicle adjacent to the lesser tuberosity slightly medial to the biceps tendon. There is mild fluid surrounding the ossicles best seen on coronal image 5 series 5. This could be related to calcific tendinitis or loose body.   Suggestion of possible  chronic quadrilateral space syndrome with fatty atrophy of the teres minor muscle.   No acute interval change.  PATIENT SURVEYS:  Quick Dash:  QUICK DASH Please rate your ability do the following activities in the last week by selecting the number below the appropriate response. Activities Rating - 11/08/2024 ***  Open a tight or new jar.  {Q-dash (1-6):32984}  Do heavy household chores (e.g., wash walls, floors). {Q-dash DEVELOPMENT WORKER, COMMUNITY  Carry a shopping bag or briefcase {Q-dash [facility] your back. {Q-dash (1-6):32984}  Use a knife to cut food. {Q-dash (1-6):32984}  Recreational activities in which you take some force or impact through your arm, shoulder or hand (e.g., golf, hammering, tennis, etc.). {Q-dash (1-6):32984}  During the past week, to what extent has your arm, shoulder or hand problem interfered with your normal social activities with family, friends, neighbors or groups?  {Q-Dash 7:32985}  During the past week, were you limited in your work or other regular daily activities as a result of your arm, shoulder or hand problem? {Q-dash 8:32988}  Rate the severity of the following symptoms in the last week: Arm, Shoulder, or hand pain. {Q-dash 0-89:67013}  Rate the severity of the following symptoms in the last week: Tingling (pins and needles) in your arm, shoulder or hand. {Q-dash 0-89:67013}  During the past week, how much difficulty have you had sleeping because of the pain in your arm, shoulder or hand?  {Q-dash 11:32987}  Disability/Symptom Score:    (A QuickDASH score may not be calculated if there is greater than 1 missing item.)  Quick Dash Disability/Symptom  Score: [(sum of *** (n) responses/*** (n)] x 25 = ***  Minimally Clinically Important Difference (MCID): 15-20 points  (Franchignoni, F. et al. (2013). Minimally clinically important difference of the disabilities of the arm, shoulder, and hand outcome measures (DASH) and its shortened version (Quick DASH). Journal of Orthopaedic & Sports Physical Therapy, 44(1), 30-39)   COGNITION: Overall cognitive status: {cognition:24006}     SENSATION: {sensation:27233}  POSTURE: ***  UPPER EXTREMITY ROM:   Active ROM Right eval Left eval  Shoulder flexion    Shoulder extension    Shoulder abduction    Shoulder adduction    Shoulder internal rotation    Shoulder external rotation    Elbow flexion    Elbow extension    Wrist flexion    Wrist extension    Wrist ulnar deviation    Wrist radial deviation    Wrist pronation    Wrist supination    (Blank rows = not tested)  Active ROM Right eval Left eval  Thumb MCP (0-60)    Thumb IP (0-80)    Thumb Radial abd/add (0-55)     Thumb Palmar abd/add (0-45)     Thumb Opposition to Small Finger     Index MCP (0-90)     Index PIP (0-100)     Index DIP (0-70)      Long MCP (0-90)      Long PIP (0-100)      Long DIP (0-70)      Ring MCP (0-90)      Ring PIP (0-100)      Ring DIP (0-70)      Little MCP (0-90)      Little PIP (0-100)      Little DIP (0-70)      (Blank rows = not tested)  UPPER EXTREMITY MMT:  MMT Right eval Left eval  Shoulder flexion    Shoulder extension  Shoulder abduction    Shoulder adduction    Shoulder internal rotation    Shoulder external rotation    Middle trapezius    Lower trapezius    Elbow flexion    Elbow extension    Wrist flexion    Wrist extension    Wrist ulnar deviation    Wrist radial deviation    Wrist pronation    Wrist supination    Grip strength (lbs)    (Blank rows = not tested)  SHOULDER SPECIAL TESTS: Impingement tests: {shoulder impingement test:25231:a} SLAP lesions:  {SLAP lesions:25232} Instability tests: {shoulder instability test:25233} Rotator cuff assessment: {rotator cuff assessment:25234} Biceps assessment: {biceps assessment:25235}  JOINT MOBILITY TESTING:  ***  PALPATION:  ***    TODAY'S TREATMENT:    *** 11/08/2024 - Eval SELF CARE:  Reviewed eval findings and role of PT in addressing identified deficits as well as instruction in initial HEP (see below).    PATIENT EDUCATION: *** Education details: PT eval findings, anticipated POC, and initial HEP  Person educated: Patient Education method: Explanation, Demonstration, Verbal cues, Tactile cues, Handouts, and MedBridge[employer] app access provided Education comprehension: verbalized understanding, verbal cues required, tactile cues required, and needs further education  HOME EXERCISE PROGRAM: ***   ASSESSMENT:  CLINICAL IMPRESSION: Elizabeth [name] is a 49 y.o. female who was referred to physical therapy for evaluation and treatment for R lateral epicondylitis.  She is also having R shoulder pain due to supraspinatus calcific tendinopathy, small supra/infraspinatus tears, and axillary nerve compression syndrome and had recent hydrodissection procedure on 10/29/23.   Patient reports onset of *** pain beginning ***. Pain is worse with ***.  Patient has deficits in *** ROM, *** flexibility, *** strength, ***abnormal posture, and TTP with abnormal muscle tension *** which are interfering with ADLs and are impacting quality of life.  On QuickDASH patient scored ***/100 demonstrating ***% disability.  Sherrice will benefit from skilled PT to address above deficits to improve mobility and activity tolerance with decreased pain interference.  OBJECTIVE IMPAIRMENTS: {opptimpairments:25111}.   ACTIVITY LIMITATIONS: {activitylimitations:27494}  PARTICIPATION LIMITATIONS: {participationrestrictions:25113}  PERSONAL FACTORS: Age, Fitness, Time since onset of injury/illness/exacerbation, and 1-2  comorbidities: Diabetes, retinopathy, liver dz/steatosis, anemia, anxiety, OA, depression, fatigue, h/o gastric bypass surgery are also affecting patient's functional outcome.   REHAB POTENTIAL: Good  CLINICAL DECISION MAKING: Evolving/moderate complexity  EVALUATION COMPLEXITY: Moderate   GOALS: Goals reviewed with patient? Yes  SHORT TERM GOALS: Target date: ***  Patient will be independent with initial HEP to improve outcomes and carryover.  Baseline: 100% PT assist required for correct completion Goal status: INITIAL  2.  Patient will report 25% improvement in RUE pain to improve QOL.  Baseline: *** Goal status: INITIAL  3.  *** Baseline: *** Goal status: {GOALSTATUS:25110}  LONG TERM GOALS: Target date: ***  Patient will be independent with ongoing/advanced HEP for self-management at home.  Baseline: no advanced HEP yet Goal status: INITIAL  2.  Patient will report 50-75% improvement in RUE pain to improve QOL.  Baseline: *** Goal status: INITIAL  3.  Patient to demonstrate improved upright posture with posterior shoulder girdle engaged to promote improved functional UE use. Baseline: *** Goal status: INITIAL  4.  Patient to improve RUE AROM to WNL without pain provocation to allow for increased ease of ADLs.  Baseline: Refer to above UE ROM table Goal status: INITIAL  5.  Patient will demonstrate improved RUE strength to >/= 5/5 for functional UE use. Baseline: Refer to above UE MMT table  Goal status: INITIAL  6  Patient will report </= ***% on QuickDASH (MCID = 14%) overall more so to demonstrate improved functional ability.  Baseline: *** Goal status: INITIAL  7.  Patient will ***   Baseline: *** Goal status: {GOALSTATUS:25110}   8. *** Baseline: *** Goal status: {GOALSTATUS:25110}   PLAN:  PT FREQUENCY: 1-2x/week  PT DURATION: 8 weeks  PLANNED INTERVENTIONS: 97164- PT Re-evaluation, 97110-Therapeutic exercises, 97530- Therapeutic activity,  97112- Neuromuscular re-education, 97535- Self Care, 02859- Manual therapy, G0283- Electrical stimulation (unattended), 97016- Vasopneumatic device, [id number]- Ultrasound, [id number]- Traction (mechanical), [id number]- Ionotophoresis 4mg /ml Dexamethasone , 79439 (1-2 muscles), 20561 (3+ muscles)- Dry Needling, Patient/Family education, Taping, Joint mobilization, Spinal mobilization, Cryotherapy, and Moist heat  PLAN FOR NEXT SESSION: ***   Elijah CHRISTELLA Hidden, PT 11/08/2024, 11:02 AM  "

## 2024-11-09 ENCOUNTER — Other Ambulatory Visit: Payer: Self-pay

## 2024-11-09 MED ORDER — METHOCARBAMOL 500 MG PO TABS: 500.0000 mg | ORAL_TABLET | Freq: Three times a day (TID) | ORAL | 1 refills | Status: AC | PRN

## 2024-11-11 ENCOUNTER — Ambulatory Visit

## 2024-11-12 ENCOUNTER — Other Ambulatory Visit: Payer: Self-pay | Admitting: Obstetrics and Gynecology

## 2024-11-15 ENCOUNTER — Ambulatory Visit: Admitting: Physical Therapy

## 2024-11-16 ENCOUNTER — Ambulatory Visit: Admitting: Family

## 2024-11-17 ENCOUNTER — Other Ambulatory Visit: Payer: Self-pay

## 2024-11-17 ENCOUNTER — Ambulatory Visit: Admitting: Obstetrics and Gynecology

## 2024-11-17 ENCOUNTER — Encounter (HOSPITAL_BASED_OUTPATIENT_CLINIC_OR_DEPARTMENT_OTHER): Payer: Self-pay | Admitting: Orthopaedic Surgery

## 2024-11-17 NOTE — Progress Notes (Unsigned)
 "  GYNECOLOGY OFFICE VISIT NOTE  History:  Elizabeth [name] is a 49 y.o. [id number] here today for perimenopause symptoms. She reports hot flashes, night sweats and vaginal dryness. She also was recently diagnosed with yeast infection and BV (11/03/24 with Dr. Nicholaus). She was treated for this culture.   She is sexually active now with new partner that she has known since HS. ***   ASCVD risk: <0.5% Elizabeth [name]: 0.9%  The following portions of the patient's history were reviewed and updated as appropriate: allergies, current medications, past family history, past medical history, past social history, past surgical history and problem list.   Health Maintenance:   Normal mammogram on 01/22/24. Cat a density, birad 1.   Review of Systems:  Pertinent items noted in HPI and remainder of comprehensive ROS otherwise negative.  Physical Exam:  LMP 11/20/2023  CONSTITUTIONAL: Well-developed, well-nourished female in no acute distress.  HEENT:  Normocephalic, atraumatic. External right and left ear normal. No scleral icterus.  NECK: Normal range of motion, supple, no masses noted on observation SKIN: No rash noted. Not diaphoretic. No erythema. No pallor. MUSCULOSKELETAL: Normal range of motion. No edema noted. NEUROLOGIC: Alert and oriented to person, place, and time. Normal muscle tone coordination. No cranial nerve deficit noted. PSYCHIATRIC: Normal mood and affect. Normal behavior. Normal judgment and thought content.  PELVIC: Deferred  Labs and Imaging No results found. However, due to the size of the patient record, not all encounters were searched. Please check Results Review for a complete set of results. US  SOFT TISSUE RT UPPER EXTREMITY LTD (NON-VASCULAR) Result Date: 10/30/2024 Right Axillary Nerve Hydrodissection Injection at the Quadrilateral Space with Ultrasound Guidance Procedure Note Elizabeth [name] 07/05/1976 Indications: Pain Procedure Details Following the description of risks including  infection, bleeding, and damage to surrounding structures patient provided written consent for right axillary nerve hydrodissection injection with ultrasound guidance. Ultrasound was used to identify the right axillary nerve at the quadrilateral space. The patient was then prepped in the usual fashion with chlorhexidine . Following topical anesthetization with ethyl chloride, the patient was injected into the right axillary nerve sheath with a solution of 5cc of 5% dextrose  in sterile water . This was well visualized under ultrasound, please see associated photographic documentation. Patient tolerated well without complication. Precautions provided. Cleaned and dressing applied.    Assessment and Plan:  1. Perimenopause (Primary) - We discussed the treatment options for menopause as well as indications, both HT and non-HT (Veozah, Paroxetine, Venlafaxine, Gabapentin ) - She has no C/I to HT.  - Discussed the benefits of each and relative effectiveness.  - Discussed goals of therapy i.e. reduction of hot flashes (not complete resolution). Reviewed full response takes up to 2-3 months, including for hormone therapy. - Discussed if HT we do shortest amount of time at lowest dose. We discussed annual attempts at coming of the HT typically in the fall months when the weather has cooled down. - We discussed the differences in modes of therapy for HT -- patch vs oral therapy.  - Discussed risks of HT: E+P = breast cancer, clotting, MI/Stroke. Discussed risk of E alone. We reviewed that limits of data from the [facility] regarding breast cancer impact - only progesterone used in that study was provera which is biologically active in the breast. We MAY reduce that risk by doing a different progesterone based therapy I.e. norethindrone , prometrium. We reviewed the indication and necessity for progesterone and that estrogen alone in those with a uterus to prevent risk of endometrial cancer (  due to unopposed estrogen) -  Discussed different possible regimens - Discussed genitourinary symptoms i.e. urinary issues, vaginal dryness and dyspareunia and that for these symptoms, local treatment is best. - She would like: {Blank single:19197::non-HT options of ***,HT,Expectant management/lifestyle modification}     Diagnoses and all orders for this visit:  Perimenopause     No orders of the defined types were placed in this encounter.    Routine preventative health maintenance measures emphasized. Please refer to After Visit Summary for other counseling recommendations.   No follow-ups on file.  Vina Solian, MD, FACOG Obstetrician & Gynecologist, [facility], [facility]      "

## 2024-11-18 ENCOUNTER — Encounter (HOSPITAL_BASED_OUTPATIENT_CLINIC_OR_DEPARTMENT_OTHER)
Admission: RE | Admit: 2024-11-18 | Discharge: 2024-11-18 | Disposition: A | Discharge status: Home / Self Care | Source: Ambulatory Visit | Attending: Orthopaedic Surgery | Admitting: Orthopaedic Surgery

## 2024-11-18 DIAGNOSIS — Z01818 Encounter for other preprocedural examination: Secondary | ICD-10-CM | POA: Insufficient documentation

## 2024-11-18 LAB — BASIC METABOLIC PANEL WITH GFR
Anion gap: 11 (ref 5–15)
BUN: 12 mg/dL (ref 6–20)
CO2: 26 mmol/L (ref 22–32)
Calcium: 9.6 mg/dL (ref 8.9–10.3)
Chloride: 102 mmol/L (ref 98–111)
Creatinine, Ser: 0.71 mg/dL (ref 0.44–1.00)
GFR, Estimated: >60 mL/min
Glucose, Bld: 95 mg/dL (ref 70–99)
Potassium: 4.3 mmol/L (ref 3.5–5.1)
Sodium: 138 mmol/L (ref 135–145)

## 2024-11-19 ENCOUNTER — Other Ambulatory Visit: Payer: Self-pay | Admitting: Obstetrics and Gynecology

## 2024-11-23 ENCOUNTER — Other Ambulatory Visit: Payer: Self-pay | Admitting: Obstetrics and Gynecology

## 2024-11-23 ENCOUNTER — Ambulatory Visit: Admitting: Dermatology

## 2024-11-25 ENCOUNTER — Encounter (HOSPITAL_BASED_OUTPATIENT_CLINIC_OR_DEPARTMENT_OTHER): Payer: Self-pay | Admitting: Orthopaedic Surgery

## 2024-11-25 ENCOUNTER — Encounter (HOSPITAL_BASED_OUTPATIENT_CLINIC_OR_DEPARTMENT_OTHER): Admission: RE | Disposition: A | Payer: Self-pay | Source: Home / Self Care | Attending: Orthopaedic Surgery

## 2024-11-25 ENCOUNTER — Encounter (HOSPITAL_BASED_OUTPATIENT_CLINIC_OR_DEPARTMENT_OTHER): Payer: Self-pay | Admitting: Certified Registered"

## 2024-11-25 ENCOUNTER — Ambulatory Visit (HOSPITAL_BASED_OUTPATIENT_CLINIC_OR_DEPARTMENT_OTHER)
Admission: RE | Admit: 2024-11-25 | Discharge: 2024-11-25 | Disposition: A | Source: Home / Self Care | Attending: Orthopaedic Surgery | Admitting: Orthopaedic Surgery

## 2024-11-25 ENCOUNTER — Other Ambulatory Visit: Payer: Self-pay

## 2024-11-25 DIAGNOSIS — Z01818 Encounter for other preprocedural examination: Secondary | ICD-10-CM

## 2024-11-25 LAB — GLUCOSE, CAPILLARY
Glucose-Capillary: 109 mg/dL — ABNORMAL HIGH (ref 70–99)
Glucose-Capillary: 160 mg/dL — ABNORMAL HIGH (ref 70–99)

## 2024-11-25 MED ORDER — DEXMEDETOMIDINE HCL IN NACL 80 MCG/20ML IV SOLN
INTRAVENOUS | Status: AC
  Filled 2024-11-25: qty 20

## 2024-11-25 MED ORDER — DEXAMETHASONE SOD PHOSPHATE PF 10 MG/ML IJ SOLN
INTRAMUSCULAR | Status: DC | PRN
  Administered 2024-11-25: 10 mg via INTRAVENOUS

## 2024-11-25 MED ORDER — ACETAMINOPHEN 500 MG PO TABS: 1000.0000 mg | ORAL_TABLET | Freq: Three times a day (TID) | ORAL | 0 refills | Status: AC

## 2024-11-25 MED ORDER — LACTATED RINGERS IV SOLN: INTRAVENOUS | Status: DC

## 2024-11-25 MED ORDER — MIDAZOLAM HCL 2 MG/2ML IJ SOLN
INTRAMUSCULAR | Status: AC
  Filled 2024-11-25: qty 2

## 2024-11-25 MED ORDER — BUPIVACAINE-EPINEPHRINE (PF) 0.5% -1:200000 IJ SOLN
INTRAMUSCULAR | Status: DC | PRN
  Administered 2024-11-25: 15 mL via PERINEURAL

## 2024-11-25 MED ORDER — MIDAZOLAM HCL (PF) 2 MG/2ML IJ SOLN
2.0000 mg | Freq: Once | INTRAMUSCULAR | Status: AC
  Administered 2024-11-25: 2 mg via INTRAVENOUS

## 2024-11-25 MED ORDER — ACETAMINOPHEN 500 MG PO TABS
ORAL_TABLET | ORAL | Status: AC
  Filled 2024-11-25: qty 2

## 2024-11-25 MED ORDER — CEFAZOLIN SODIUM-DEXTROSE 2-4 GM/100ML-% IV SOLN
2.0000 g | INTRAVENOUS | Status: AC
  Administered 2024-11-25: 2 g via INTRAVENOUS

## 2024-11-25 MED ORDER — JOURNAVX 50 MG PO TABS: 50.0000 mg | ORAL_TABLET | ORAL | 0 refills | Status: AC

## 2024-11-25 MED ORDER — BUPIVACAINE LIPOSOME 1.3 % IJ SUSP
INTRAMUSCULAR | Status: DC | PRN
  Administered 2024-11-25: 10 mL via PERINEURAL

## 2024-11-25 MED ORDER — ACETAMINOPHEN 500 MG PO TABS
1000.0000 mg | ORAL_TABLET | Freq: Once | ORAL | Status: AC
  Administered 2024-11-25: 1000 mg via ORAL

## 2024-11-25 MED ORDER — LIDOCAINE 2% (20 MG/ML) 5 ML SYRINGE
INTRAMUSCULAR | Status: DC | PRN
  Administered 2024-11-25: 100 mg via INTRAVENOUS

## 2024-11-25 MED ORDER — EPINEPHRINE PF 1 MG/ML IJ SOLN
INTRAMUSCULAR | Status: AC
  Filled 2024-11-25: qty 4

## 2024-11-25 MED ORDER — TRANEXAMIC ACID-NACL 1000-0.7 MG/100ML-% IV SOLN
1000.0000 mg | INTRAVENOUS | Status: AC
  Administered 2024-11-25: 1000 mg via INTRAVENOUS

## 2024-11-25 MED ORDER — LIDOCAINE 2% (20 MG/ML) 5 ML SYRINGE
INTRAMUSCULAR | Status: AC
  Filled 2024-11-25: qty 5

## 2024-11-25 MED ORDER — PROPOFOL 500 MG/50ML IV EMUL
INTRAVENOUS | Status: AC
  Filled 2024-11-25: qty 50

## 2024-11-25 MED ORDER — SODIUM CHLORIDE 0.9 % IR SOLN
Status: DC | PRN
  Administered 2024-11-25: 6000 mL

## 2024-11-25 MED ORDER — CEFAZOLIN SODIUM-DEXTROSE 2-4 GM/100ML-% IV SOLN
INTRAVENOUS | Status: AC
  Filled 2024-11-25: qty 100

## 2024-11-25 MED ORDER — PROPOFOL 10 MG/ML IV BOLUS
INTRAVENOUS | Status: DC | PRN
  Administered 2024-11-25: 200 mg via INTRAVENOUS

## 2024-11-25 MED ORDER — ONDANSETRON HCL 4 MG/2ML IJ SOLN
INTRAMUSCULAR | Status: DC | PRN
  Administered 2024-11-25: 4 mg via INTRAVENOUS

## 2024-11-25 MED ORDER — OXYCODONE HCL 5 MG PO TABS: 5.0000 mg | ORAL_TABLET | Freq: Once | ORAL | Status: DC | PRN

## 2024-11-25 MED ORDER — TRANEXAMIC ACID-NACL 1000-0.7 MG/100ML-% IV SOLN
INTRAVENOUS | Status: AC
  Filled 2024-11-25: qty 100

## 2024-11-25 MED ORDER — ROCURONIUM BROMIDE 10 MG/ML (PF) SYRINGE
PREFILLED_SYRINGE | INTRAVENOUS | Status: AC
  Filled 2024-11-25: qty 10

## 2024-11-25 MED ORDER — ONDANSETRON HCL 4 MG/2ML IJ SOLN
INTRAMUSCULAR | Status: AC
  Filled 2024-11-25: qty 2

## 2024-11-25 MED ORDER — ROCURONIUM BROMIDE 10 MG/ML (PF) SYRINGE
PREFILLED_SYRINGE | INTRAVENOUS | Status: DC | PRN
  Administered 2024-11-25: 70 mg via INTRAVENOUS

## 2024-11-25 MED ORDER — DEXAMETHASONE SOD PHOSPHATE PF 10 MG/ML IJ SOLN
INTRAMUSCULAR | Status: AC
  Filled 2024-11-25: qty 1

## 2024-11-25 MED ORDER — GABAPENTIN 300 MG PO CAPS
ORAL_CAPSULE | ORAL | Status: AC
  Filled 2024-11-25: qty 1

## 2024-11-25 MED ORDER — ONDANSETRON 4 MG PO TBDP: 4.0000 mg | ORAL_TABLET | Freq: Three times a day (TID) | ORAL | 0 refills | Status: AC | PRN

## 2024-11-25 MED ORDER — FENTANYL CITRATE (PF) 100 MCG/2ML IJ SOLN: 25.0000 ug | INTRAMUSCULAR | Status: DC | PRN

## 2024-11-25 MED ORDER — OXYCODONE HCL 5 MG PO TABS: ORAL_TABLET | ORAL | 0 refills | Status: AC

## 2024-11-25 MED ORDER — FENTANYL CITRATE (PF) 100 MCG/2ML IJ SOLN
INTRAMUSCULAR | Status: AC
  Filled 2024-11-25: qty 2

## 2024-11-25 MED ORDER — DROPERIDOL 2.5 MG/ML IJ SOLN
INTRAMUSCULAR | Status: AC
  Filled 2024-11-25: qty 2

## 2024-11-25 MED ORDER — PROPOFOL 10 MG/ML IV BOLUS
INTRAVENOUS | Status: AC
  Filled 2024-11-25: qty 20

## 2024-11-25 MED ORDER — OXYCODONE HCL 5 MG/5ML PO SOLN: 5.0000 mg | Freq: Once | ORAL | Status: DC | PRN

## 2024-11-25 MED ORDER — DROPERIDOL 2.5 MG/ML IJ SOLN
0.6250 mg | Freq: Once | INTRAMUSCULAR | Status: AC | PRN
  Administered 2024-11-25: 0.625 mg via INTRAVENOUS

## 2024-11-25 MED ORDER — SUGAMMADEX SODIUM 200 MG/2ML IV SOLN
INTRAVENOUS | Status: DC | PRN
  Administered 2024-11-25: 200 mg via INTRAVENOUS

## 2024-11-25 MED ORDER — GABAPENTIN 300 MG PO CAPS
300.0000 mg | ORAL_CAPSULE | Freq: Once | ORAL | Status: AC
  Administered 2024-11-25: 300 mg via ORAL

## 2024-11-25 MED ORDER — FENTANYL CITRATE (PF) 100 MCG/2ML IJ SOLN
100.0000 ug | Freq: Once | INTRAMUSCULAR | Status: AC
  Administered 2024-11-25: 100 ug via INTRAVENOUS

## 2024-11-25 MED ORDER — FENTANYL CITRATE (PF) 100 MCG/2ML IJ SOLN
INTRAMUSCULAR | Status: DC | PRN
  Administered 2024-11-25: 50 ug via INTRAVENOUS

## 2024-11-25 NOTE — Anesthesia Procedure Notes (Signed)
 Anesthesia Regional Block: Interscalene brachial plexus block   Pre-Anesthetic Checklist: , timeout performed,  Correct Patient, Correct Site, Correct Laterality,  Correct Procedure, Correct Position, site marked,  Risks and benefits discussed,  Pre-op evaluation,  At surgeon's request and post-op pain management  Laterality: Right  Prep: Maximum Sterile Barrier Precautions used, chloraprep       Needles:  Injection technique: Single-shot  Needle Type: Echogenic Stimulator Needle     Needle Length: 4cm  Needle Gauge: 22     Additional Needles:   Procedures:,,,, ultrasound used (permanent image in chart),,    Narrative:  Start time: 11/25/2024 6:59 AM End time: 11/25/2024 7:02 AM Injection made incrementally with aspirations every 5 mL.  Performed by: Personally  Anesthesiologist: Paul Lamarr BRAVO, MD  Additional Notes: Risks, benefits, and alternative discussed. Patient gave consent for procedure. Patient prepped and draped in sterile fashion. Sedation administered, patient remains easily responsive to voice. Relevant anatomy identified with ultrasound guidance. Local anesthetic given in 5cc increments with no signs or symptoms of intravascular injection. No pain or paraesthesias with injection. Patient monitored throughout procedure with no signs of LAST or immediate complications. Tolerated well. Ultrasound image placed in chart.  LANEY Paul, MD

## 2024-11-25 NOTE — Op Note (Signed)
 Orthopaedic Surgery Operative Note (CSN: [id number])  Elizabeth [name]  1976-02-02 Date of Surgery: 11/25/2024   DIAGNOSES: Right shoulder, acute on chronic rotator cuff tear, SLAP tear, biceps tendinitis, AC arthritis, and subacromial impingement.  POST-OPERATIVE DIAGNOSIS: same  PROCEDURE: Arthroscopic extensive debridement - 29823 Subdeltoid Bursa, Supraspinatus Tendon, Anterior Labrum, and [facility] L, upper border subscapularis Arthroscopic distal clavicle excision - 70175 Arthroscopic subacromial decompression - 70173 Arthroscopic rotator cuff repair - 70172 Arthroscopic biceps tenodesis - 70171   OPERATIVE FINDING: Exam under anesthesia: Normal Articular space: Moderate capsulitis, anterior joint Chondral surfaces: Normal Biceps: Type II SLAP tear Subscapularis: Incomplete tear upper border 5% Supraspinatus: Incomplete tear partial-thickness greater than 50% tear of the supraspinatus Infraspinatus: Intact   Patient does have a imaging finding of quadrilateral space syndrome however she had a significant partial-thickness cuff tear.  Based on the risk of quadrilateral space syndrome issues with decompression I felt that addressing her more simple pathology would be appropriate.  If she fails this we may have to readdress the quadrilateral space syndrome.  Patient was understanding of this plan of care.   Post-operative plan: The patient will be non-weightbearing in a sling 6 weeks.  The patient will be discharged home.  DVT prophylaxis not indicated in ambulatory upper extremity patient without known risk factors.   Pain control with PRN pain medication preferring oral medicines.  Follow up plan will be scheduled in approximately 7 days for incision check and XR.  Surgeons:Primary: Cristy Bonner DASEN, MD Assistants:Caroline McBane, PA-C Location: [facility] OR ROOM 6 Anesthesia: General with Exparel  interscalene block Antibiotics: Ancef  2 g Tourniquet time: None Estimated Blood Loss:  Minimal Complications: None Specimens: None Implants: Implant Name Type Inv. Item Serial No. Manufacturer Lot No. LRB No. Used Action  ANCHOR SUT 1.8 FIBERTAK SB KL - [id number] Anchor ANCHOR SUT 1.8 FIBERTAK SB KL  [employer] [id number] Right 1 Implanted  ANCHOR SUT 1.8 FIBERTAK SB KL - [id number] Anchor ANCHOR SUT 1.8 FIBERTAK SB KL  [employer] [id number] Right 1 Implanted  ANCHOR SUT BIO SW 4.75X19.1 - [id number] Anchor ANCHOR SUT BIO SW 4.75X19.1  [employer] [id number] Right 1 Implanted    Indications for Surgery:   Elizabeth [name] is a 49 y.o. female with continued shoulder pain refractory to nonoperative measures for extended period of time.    The risks and benefits were explained at length including but not limited to continued pain, cuff failure, biceps tenodesis failure, stiffness, need for further surgery and infection.   Procedure:   Patient was correctly identified in the preoperative holding area and operative site marked.  Patient brought to OR and positioned beachchair on an [place] table ensuring that all bony prominences were padded and the head was in an appropriate location.  Anesthesia was induced and the operative shoulder was prepped and draped in the usual sterile fashion.  Timeout was called preincision.  A standard posterior viewing portal was made after localizing the portal with a spinal needle.  An anterior accessory portal was also made.  After clearing the articular space the camera was positioned in the subacromial space.  Findings above.    Extensive debridement performed of rotator cuff, MGHL, anterior labrum, subacromial bursitis, CA ligament.     Subacromial decompression: We made a lateral portal with spinal needle guidance. We then proceeded to debride bursal tissue extensively with a shaver and arthrocare device. At that point we continued to identify the borders of the acromion and identify the spur. We then carefully preserved the deltoid  fascia and used a  burr to convert the acromion to a Type 1 flat acromion without issue.  Biceps tenodesis: We marked the tendon and then performed a tenotomy and debridement of the stump in the articular space. We then identified the biceps tendon in its groove suprapec with the arthroscope in the lateral portal taking care to move from lateral to medial to avoid injury to the subscapularis. At that point we unroofed the tendon itself and mobilized it. An accessory anterior portal was made in line with the tendon and we grasped it from the anterior superior portal and worked from the accessory anterior portal. Two Fibertak 1.35mm knotless anchors were placed in the groove and the tendon was secured in a luggage loop style fashion with a pass of the limb of suture through the tendon using a scorpion device to avoid pull-through.  Repair was completed with good tension on the tendon.  Residual stump of the tendon was removed after being resected with a RF ablator.  Distal Clavicle resection:  The scope was placed in the subacromial space from the posterior portal.  A hemostat was placed through the anterior portal and we spread at the [facility] joint.  A burr was then inserted and 10 mm of distal clavicle was resected taking care to avoid damage to the capsule around the joint and avoiding overhanging bone posteriorly.    Rotator cuff repair.  We identified the partial-thickness cuff injury.  We felt that it was appropriate for repair.  We took down the residual tendon and prepared the tuberosity for healing.  We then passed a fiber tape with a scorpion device and inverted mattress fashion and brought it to a 4.75 biocomposite swivel lock 8 to 10 mm below the tuberosity.  Good fixation was noted.  The incisions were closed with absorbable monocryl and steri strips.  A sterile dressing was placed along with a sling. The patient was awoken from general anesthesia and taken to the PACU in stable condition without complication.    Aleck Stalling, PA-C, present and scrubbed throughout the case, critical for completion in a timely fashion, and for retraction, instrumentation, closure.

## 2024-11-25 NOTE — Anesthesia Procedure Notes (Signed)
 Procedure Name: Intubation Date/Time: 11/25/2024 7:31 AM  Performed by: Claudene Delon SQUIBB, CRNAPre-anesthesia Checklist: Patient identified, Emergency Drugs available, Suction available and Patient being monitored Patient Re-evaluated:Patient Re-evaluated prior to induction Oxygen Delivery Method: Circle System Utilized Preoxygenation: Pre-oxygenation with 100% oxygen Induction Type: IV induction Ventilation: Mask ventilation without difficulty Laryngoscope Size: Mac and 3 Grade View: Grade I Tube type: Oral Tube size: 7.0 mm Number of attempts: 1 Airway Equipment and Method: Stylet Placement Confirmation: ETT inserted through vocal cords under direct vision, positive ETCO2 and breath sounds checked- equal and bilateral Secured at: 23 cm Tube secured with: Tape Dental Injury: Teeth and Oropharynx as per pre-operative assessment

## 2024-11-25 NOTE — Discharge Instructions (Addendum)
 Bonner Hair MD, MPH Aleck Stalling, PA-C [facility] [address] [phone] (tel)   [phone] (fax)   POST-OPERATIVE INSTRUCTIONS - SHOULDER ARTHROSCOPY  WOUND CARE You may remove the Operative Dressing on Post-Op Day #3 (72hrs after surgery).   Alternatively if you would like you can leave dressing on until follow-up if within 7-8 days but keep it dry. Leave steri-strips in place until they fall off on their own, usually 2 weeks postop. There may be a small amount of fluid/bleeding leaking at the surgical site.  This is normal; the shoulder is filled with fluid during the procedure and can leak for 24-48hrs after surgery.  You may change/reinforce the bandage as needed.  Use the Cryocuff or Ice as often as possible for the first 7 days, then as needed for pain relief. Always keep a towel, ACE wrap or other barrier between the cooling unit and your skin.  You may shower on Post-Op Day #3. Gently pat the area dry.  Do not soak the shoulder in water  or submerge it.  Keep incisions as dry as possible. Do not go swimming in the pool or ocean until 4 weeks after surgery or when otherwise instructed.    EXERCISES Wear the sling at all times  You may remove the sling for showering, but keep the arm across the chest or in a secondary sling.     It is normal for your fingers/hand to become more swollen after surgery and discolored from bruising.   This will resolve over the first few weeks usually after surgery. Please continue to ambulate and do not stay sitting or lying for too long.  Perform foot and wrist pumps to assist in circulation.  PHYSICAL THERAPY - You will begin physical therapy soon after surgery (unless otherwise specified) - Please call to set up an appointment, if you do not already have one  - Let our office if there are any issues with scheduling your therapy    REGIONAL ANESTHESIA (NERVE BLOCKS) The anesthesia team may have  performed a nerve block for you this is a great tool used to minimize pain.   The block may start wearing off overnight (between 8-24 hours postop) When the block wears off, your pain may go from nearly zero to the pain you would have had postop without the block. This is an abrupt transition but nothing dangerous is happening.   This can be a challenging period but utilize your as needed pain medications to try and manage this period. We suggest you use the pain medication the first night prior to going to bed, to ease this transition.  You may take an extra dose of narcotic when this happens if needed  POST-OP MEDICATIONS- Multimodal approach to pain control In general your pain will be controlled with a combination of substances.  Prescriptions unless otherwise discussed are electronically sent to your pharmacy.  This is a carefully made plan we use to minimize narcotic use.      Acetaminophen  - Non-narcotic pain medicine taken on a scheduled basis  Journavx  - this is to help with acute pain, take as instructed Oxycodone  - This is a strong narcotic, to be used only on an as needed basis for SEVERE pain. After you finish this prescription you will resume your normal daily pain prescription Zofran  - take as needed for nausea   FOLLOW-UP If you develop a Fever (>=101.5), Redness or Drainage from the surgical incision site, please call our office  to arrange for an evaluation. Please call the office to schedule a follow-up appointment for your first post-operative appointment, 7-10 days post-operatively.    HELPFUL INFORMATION   You may be more comfortable sleeping in a semi-seated position the first few nights following surgery.  Keep a pillow propped under the elbow and forearm for comfort.  If you have a recliner type of chair it might be beneficial.  If not that is fine too, but it would be helpful to sleep propped up with pillows behind your operated shoulder as well under your elbow  and forearm.  This will reduce pulling on the suture lines.  When dressing, put your operative arm in the sleeve first.  When getting undressed, take your operative arm out last.  Loose fitting, button-down shirts are recommended.  Often in the first days after surgery you may be more comfortable keeping your operative arm under your shirt and not through the sleeve.  You may return to work/school in the next couple of days when you feel up to it.  Desk work and typing in the sling is fine.  We suggest you use the pain medication the first night prior to going to bed, in order to ease any pain when the anesthesia wears off. You should avoid taking pain medications on an empty stomach as it will make you nauseous.  You should wean off your narcotic medicines as soon as you are able.  Most patients will be off narcotics before their first postop appointment.   Do not drink alcoholic beverages or take illicit drugs when taking pain medications.  It is against the law to drive while taking narcotics.  In some states it is against the law to drive while your arm is in a sling.   Pain medication may make you constipated.  Below are a few solutions to try in this order: Decrease the amount of pain medication if you aren't having pain. Drink lots of decaffeinated fluids. Drink prune juice and/or eat dried prunes  If the first 3 don't work start with additional solutions Take Colace - an over-the-counter stool softener Take Senokot - an over-the-counter laxative Take Miralax - a stronger over-the-counter laxative  For more information including helpful videos and documents visit our website:   [email or web address]    Post Anesthesia Home Care Instructions  Activity: Get plenty of rest for the remainder of the day. A responsible individual must stay with you for 24 hours following the procedure.  For the next 24 hours, DO NOT: -Drive a car -Social worker -Drink alcoholic beverages -Take any medication unless instructed by your physician -Make any legal decisions or sign important papers.  Meals: Start with liquid foods such as gelatin or soup. Progress to regular foods as tolerated. Avoid greasy, spicy, heavy foods. If nausea and/or vomiting occur, drink only clear liquids until the nausea and/or vomiting subsides. Call your physician if vomiting continues.  Special Instructions/Symptoms: Your throat may feel dry or sore from the anesthesia or the breathing tube placed in your throat during surgery. If this causes discomfort, gargle with warm salt water . The discomfort should disappear within 24 hours.  Information for Discharge Teaching: EXPAREL  (bupivacaine  liposome injectable suspension)   Pain relief is important to your recovery. The goal is to control your pain so you can move easier and return to your normal activities as soon as possible after your procedure. Your physician may use several types of medicines to manage pain, swelling, and more.  Your surgeon  or anesthesiologist gave you EXPAREL (bupivacaine ) to help control your pain after surgery.  EXPAREL  is a local anesthetic designed to release slowly over an extended period of time to provide pain relief by numbing the tissue around the surgical site. EXPAREL  is designed to release pain medication over time and can control pain for up to 72 hours. Depending on how you respond to EXPAREL , you may require less pain medication during your recovery. EXPAREL  can help reduce or eliminate the need for opioids during the first few days after surgery when pain relief is needed the most. EXPAREL  is not an opioid and is not addictive. It does not cause sleepiness or sedation.   Important! A teal colored band has been placed on your arm with the date, time and amount of EXPAREL  you have received. Please leave this armband in place for the full 96 hours following administration, and  then you may remove the band. If you return to the hospital for any reason within 96 hours following the administration of EXPAREL , the armband provides important information that your health care providers to know, and alerts them that you have received this anesthetic.    Possible side effects of EXPAREL : Temporary loss of sensation or ability to move in the area where medication was injected. Nausea, vomiting, constipation Rarely, numbness and tingling in your mouth or lips, lightheadedness, or anxiety may occur. Call your doctor right away if you think you may be experiencing any of these sensations, or if you have other questions regarding possible side effects.  Follow all other discharge instructions given to you by your surgeon or nurse. Eat a healthy diet and drink plenty of water  or other fluids.  Regional Anesthesia Blocks  1. You may not be able to move or feel the blocked extremity after a regional anesthetic block. This may last may last from 3-48 hours after placement, but it will go away. The length of time depends on the medication injected and your individual response to the medication. As the nerves start to wake up, you may experience tingling as the movement and feeling returns to your extremity. If the numbness and inability to move your extremity has not gone away after 48 hours, please call your surgeon.   2. The extremity that is blocked will need to be protected until the numbness is gone and the strength has returned. Because you cannot feel it, you will need to take extra care to avoid injury. Because it may be weak, you may have difficulty moving it or using it. You may not know what position it is in without looking at it while the block is in effect.  3. For blocks in the legs and feet, returning to weight bearing and walking needs to be done carefully. You will need to wait until the numbness is entirely gone and the strength has returned. You should be able to move  your leg and foot normally before you try and bear weight or walk. You will need someone to be with you when you first try to ensure you do not fall and possibly risk injury.  4. Bruising and tenderness at the needle site are common side effects and will resolve in a few days.  5. Persistent numbness or new problems with movement should be communicated to the surgeon or the [facility] [phone] [facility] [phone]).      Next dose of Tylenol  may be given at 12:30pm if needed.

## 2024-11-25 NOTE — Interval H&P Note (Signed)
 All questions answered, patient wants to proceed with procedure. ? ?

## 2024-11-25 NOTE — Progress Notes (Signed)
Assisted Dr. Daiva Huge with right, interscalene , ultrasound guided block. Side rails up, monitors on throughout procedure. See vital signs in flow sheet. Tolerated Procedure well.

## 2024-11-25 NOTE — Anesthesia Preprocedure Evaluation (Addendum)
"                                    Anesthesia Evaluation  Patient identified by MRN, date of birth, ID band Patient awake    Reviewed: Allergy & Precautions, NPO status , Patient's Chart, lab work & pertinent test results  History of Anesthesia Complications Negative for: history of anesthetic complications  Airway Mallampati: II  TM Distance: >3 FB Neck ROM: Full    Dental  (+)    Pulmonary former smoker   Pulmonary exam normal        Cardiovascular hypertension, Pt. on medications Normal cardiovascular exam     Neuro/Psych   Anxiety Depression    negative neurological ROS     GI/Hepatic Neg liver ROS,GERD  Medicated,,  Endo/Other  diabetes, Type 2, Oral Hypoglycemic Agents  Class 3 obesity  Renal/GU negative Renal ROS     Musculoskeletal  (+) Arthritis ,    Abdominal   Peds  Hematology negative hematology ROS (+)   Anesthesia Other Findings   Reproductive/Obstetrics                              Anesthesia Physical Anesthesia Plan  ASA: 3  Anesthesia Plan: General   Post-op Pain Management: Tylenol  PO (pre-op)* and Regional block*   Induction: Intravenous  PONV Risk Score and Plan: 3 and Treatment may vary due to age or medical condition, Ondansetron , Dexamethasone  and Midazolam   Airway Management Planned: Oral ETT  Additional Equipment: None  Intra-op Plan:   Post-operative Plan: Extubation in OR  Informed Consent: I have reviewed the patients History and Physical, chart, labs and discussed the procedure including the risks, benefits and alternatives for the proposed anesthesia with the patient or authorized representative who has indicated his/her understanding and acceptance.     Dental advisory given  Plan Discussed with: CRNA  Anesthesia Plan Comments:          Anesthesia Quick Evaluation  "

## 2024-11-25 NOTE — Anesthesia Postprocedure Evaluation (Signed)
"   Anesthesia Post Note  Patient: [name]  Procedure(s) Performed: ARTHROSCOPY, SHOULDER, WITH ROTATOR CUFF REPAIR, SUBACOMIAL DECOMPRESSION, BICEPS TENODESIS DISTAL CLAVICLE EXICISION AND DEBRIDMENT (Right: Shoulder)     Patient location during evaluation: PACU Anesthesia Type: General Level of consciousness: awake and alert Pain management: pain level controlled Vital Signs Assessment: post-procedure vital signs reviewed and stable Respiratory status: spontaneous breathing, nonlabored ventilation and respiratory function stable Cardiovascular status: blood pressure returned to baseline Postop Assessment: no apparent nausea or vomiting Anesthetic complications: no   No notable events documented.  Last Vitals:  Vitals:   11/25/24 0900 11/25/24 0915  BP: (!) 151/101 (!) 157/96  Pulse: 91 90  Resp: 17 20  Temp:    SpO2: 95% 94%    Last Pain:  Vitals:   11/25/24 0845  TempSrc:   PainSc: 3                  [address]      "

## 2024-11-25 NOTE — Transfer of Care (Signed)
 Immediate Anesthesia Transfer of Care Note  Patient: Elizabeth [name]  Procedure(s) Performed: ARTHROSCOPY, SHOULDER, WITH ROTATOR CUFF REPAIR, SUBACOMIAL DECOMPRESSION, BICEPS TENODESIS DISTAL CLAVICLE EXICISION AND DEBRIDMENT (Right: Shoulder)  Patient Location: PACU  Anesthesia Type:General  Level of Consciousness: awake and patient cooperative  Airway & Oxygen Therapy: Patient Spontanous Breathing and Patient connected to face mask oxygen  Post-op Assessment: Report given to RN and Post -op Vital signs reviewed and stable  Post vital signs: Reviewed and stable  Last Vitals:  Vitals Value Taken Time  BP 149/90 11/25/24 08:36  Temp 36.4 C 11/25/24 08:36  Pulse 103 11/25/24 08:37  Resp 12 11/25/24 08:37  SpO2 99 % 11/25/24 08:37  Vitals shown include unfiled device data.  Last Pain:  Vitals:   11/25/24 0628  TempSrc: Temporal  PainSc: 5       Patients Stated Pain Goal: 3 (11/25/24 9371)  Complications: No notable events documented.

## 2024-11-26 ENCOUNTER — Encounter (HOSPITAL_BASED_OUTPATIENT_CLINIC_OR_DEPARTMENT_OTHER): Payer: Self-pay | Admitting: Orthopaedic Surgery

## 2024-12-10 ENCOUNTER — Ambulatory Visit: Admitting: Family

## 2024-12-16 ENCOUNTER — Ambulatory Visit: Admitting: Obstetrics and Gynecology

## 2025-02-02 ENCOUNTER — Ambulatory Visit: Admitting: Internal Medicine

## 2025-02-23 ENCOUNTER — Ambulatory Visit: Admitting: Dermatology
